# Patient Record
Sex: Female | Born: 1945
Health system: Southern US, Community
[De-identification: ages and names within clinical notes are randomized; demographics above are authoritative.]

## PROBLEM LIST (undated history)

## (undated) DIAGNOSIS — C44622 Squamous cell carcinoma of skin of right upper limb, including shoulder: Secondary | ICD-10-CM

## (undated) DIAGNOSIS — K7689 Other specified diseases of liver: Secondary | ICD-10-CM

## (undated) DIAGNOSIS — K5730 Diverticulosis of large intestine without perforation or abscess without bleeding: Secondary | ICD-10-CM

## (undated) DIAGNOSIS — Q615 Medullary cystic kidney: Secondary | ICD-10-CM

## (undated) DIAGNOSIS — N200 Calculus of kidney: Secondary | ICD-10-CM

## (undated) DIAGNOSIS — K56600 Partial intestinal obstruction, unspecified as to cause: Secondary | ICD-10-CM

## (undated) DIAGNOSIS — E785 Hyperlipidemia, unspecified: Secondary | ICD-10-CM

## (undated) DIAGNOSIS — D649 Anemia, unspecified: Secondary | ICD-10-CM

## (undated) DIAGNOSIS — J449 Chronic obstructive pulmonary disease, unspecified: Secondary | ICD-10-CM

## (undated) DIAGNOSIS — G2581 Restless legs syndrome: Secondary | ICD-10-CM

## (undated) DIAGNOSIS — T7840XA Allergy, unspecified, initial encounter: Secondary | ICD-10-CM

## (undated) DIAGNOSIS — H269 Unspecified cataract: Secondary | ICD-10-CM

## (undated) DIAGNOSIS — J45909 Unspecified asthma, uncomplicated: Secondary | ICD-10-CM

## (undated) DIAGNOSIS — R0902 Hypoxemia: Secondary | ICD-10-CM

## (undated) DIAGNOSIS — B029 Zoster without complications: Secondary | ICD-10-CM

## (undated) DIAGNOSIS — Z860100 Personal history of colon polyps, unspecified: Secondary | ICD-10-CM

## (undated) DIAGNOSIS — I10 Essential (primary) hypertension: Secondary | ICD-10-CM

## (undated) DIAGNOSIS — M1990 Unspecified osteoarthritis, unspecified site: Secondary | ICD-10-CM

## (undated) DIAGNOSIS — K219 Gastro-esophageal reflux disease without esophagitis: Secondary | ICD-10-CM

## (undated) DIAGNOSIS — E042 Nontoxic multinodular goiter: Secondary | ICD-10-CM

## (undated) DIAGNOSIS — E669 Obesity, unspecified: Secondary | ICD-10-CM

## (undated) DIAGNOSIS — Z5189 Encounter for other specified aftercare: Secondary | ICD-10-CM

## (undated) DIAGNOSIS — I872 Venous insufficiency (chronic) (peripheral): Secondary | ICD-10-CM

## (undated) DIAGNOSIS — K449 Diaphragmatic hernia without obstruction or gangrene: Secondary | ICD-10-CM

## (undated) HISTORY — DX: Calculus of kidney: N20.0

## (undated) HISTORY — DX: Encounter for other specified aftercare: Z51.89

## (undated) HISTORY — DX: Squamous cell carcinoma of skin of right upper limb, including shoulder: C44.622

## (undated) HISTORY — DX: Allergy, unspecified, initial encounter: T78.40XA

## (undated) HISTORY — DX: Personal history of colon polyps, unspecified: Z86.0100

## (undated) HISTORY — DX: Gastro-esophageal reflux disease without esophagitis: K21.9

## (undated) HISTORY — DX: Zoster without complications: B02.9

## (undated) HISTORY — DX: Essential (primary) hypertension: I10

## (undated) HISTORY — DX: Venous insufficiency (chronic) (peripheral): I87.2

## (undated) HISTORY — DX: Hypoxemia: R09.02

## (undated) HISTORY — DX: Obesity, unspecified: E66.9

## (undated) HISTORY — DX: Personal history of colonic polyps: Z86.010

## (undated) HISTORY — DX: Restless legs syndrome: G25.81

## (undated) HISTORY — DX: Hyperlipidemia, unspecified: E78.5

## (undated) HISTORY — DX: Other specified diseases of liver: K76.89

## (undated) HISTORY — DX: Anemia, unspecified: D64.9

## (undated) HISTORY — DX: Unspecified cataract: H26.9

## (undated) HISTORY — DX: Unspecified asthma, uncomplicated: J45.909

## (undated) HISTORY — PX: CATARACT EXTRACTION: SUR2

## (undated) HISTORY — PX: FOOT SURGERY: SHX648

## (undated) HISTORY — PX: OTHER SURGICAL HISTORY: SHX169

## (undated) HISTORY — DX: Diverticulosis of large intestine without perforation or abscess without bleeding: K57.30

## (undated) HISTORY — DX: Nontoxic multinodular goiter: E04.2

## (undated) HISTORY — DX: Chronic obstructive pulmonary disease, unspecified: J44.9

## (undated) HISTORY — DX: Diaphragmatic hernia without obstruction or gangrene: K44.9

## (undated) HISTORY — DX: Unspecified osteoarthritis, unspecified site: M19.90

## (undated) HISTORY — PX: COLONOSCOPY: SHX174

## (undated) HISTORY — DX: Medullary cystic kidney: Q61.5

---
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
## 1975-08-06 HISTORY — PX: GLAUCOMA SURGERY: SHX656

## 1994-08-05 HISTORY — PX: OTHER SURGICAL HISTORY: SHX169

## 1998-03-10 ENCOUNTER — Ambulatory Visit (HOSPITAL_COMMUNITY): Admission: RE | Admit: 1998-03-10 | Discharge: 1998-03-10 | Payer: Self-pay | Admitting: Orthopedic Surgery

## 1998-09-05 ENCOUNTER — Other Ambulatory Visit: Admission: RE | Admit: 1998-09-05 | Discharge: 1998-09-05 | Payer: Self-pay | Admitting: Gynecology

## 1999-11-06 ENCOUNTER — Ambulatory Visit (HOSPITAL_BASED_OUTPATIENT_CLINIC_OR_DEPARTMENT_OTHER): Admission: RE | Admit: 1999-11-06 | Discharge: 1999-11-06 | Payer: Self-pay | Admitting: Plastic Surgery

## 1999-11-06 ENCOUNTER — Encounter (INDEPENDENT_AMBULATORY_CARE_PROVIDER_SITE_OTHER): Payer: Self-pay | Admitting: *Deleted

## 2000-01-05 ENCOUNTER — Emergency Department (HOSPITAL_COMMUNITY): Admission: EM | Admit: 2000-01-05 | Discharge: 2000-01-05 | Payer: Self-pay | Admitting: *Deleted

## 2000-01-06 ENCOUNTER — Encounter: Payer: Self-pay | Admitting: *Deleted

## 2000-01-08 ENCOUNTER — Encounter: Payer: Self-pay | Admitting: Orthopedic Surgery

## 2000-01-08 ENCOUNTER — Encounter: Admission: RE | Admit: 2000-01-08 | Discharge: 2000-01-08 | Payer: Self-pay | Admitting: Orthopedic Surgery

## 2000-01-09 ENCOUNTER — Ambulatory Visit (HOSPITAL_BASED_OUTPATIENT_CLINIC_OR_DEPARTMENT_OTHER): Admission: RE | Admit: 2000-01-09 | Discharge: 2000-01-09 | Payer: Self-pay | Admitting: Orthopedic Surgery

## 2000-02-26 ENCOUNTER — Other Ambulatory Visit: Admission: RE | Admit: 2000-02-26 | Discharge: 2000-02-26 | Payer: Self-pay | Admitting: Gynecology

## 2001-03-26 ENCOUNTER — Other Ambulatory Visit: Admission: RE | Admit: 2001-03-26 | Discharge: 2001-03-26 | Payer: Self-pay | Admitting: Gynecology

## 2002-05-10 ENCOUNTER — Other Ambulatory Visit: Admission: RE | Admit: 2002-05-10 | Discharge: 2002-05-10 | Payer: Self-pay | Admitting: Gynecology

## 2003-05-19 ENCOUNTER — Other Ambulatory Visit: Admission: RE | Admit: 2003-05-19 | Discharge: 2003-05-19 | Payer: Self-pay | Admitting: Gynecology

## 2004-06-08 ENCOUNTER — Ambulatory Visit: Payer: Self-pay | Admitting: Gastroenterology

## 2004-06-25 ENCOUNTER — Other Ambulatory Visit: Admission: RE | Admit: 2004-06-25 | Discharge: 2004-06-25 | Payer: Self-pay | Admitting: Gynecology

## 2004-06-25 ENCOUNTER — Ambulatory Visit: Payer: Self-pay | Admitting: Pulmonary Disease

## 2004-10-18 ENCOUNTER — Ambulatory Visit: Payer: Self-pay | Admitting: Pulmonary Disease

## 2004-10-19 ENCOUNTER — Ambulatory Visit: Payer: Self-pay | Admitting: Cardiology

## 2004-10-24 ENCOUNTER — Ambulatory Visit: Payer: Self-pay

## 2004-11-07 ENCOUNTER — Ambulatory Visit: Payer: Self-pay | Admitting: *Deleted

## 2004-11-12 ENCOUNTER — Ambulatory Visit: Payer: Self-pay | Admitting: Pulmonary Disease

## 2004-11-14 ENCOUNTER — Ambulatory Visit: Payer: Self-pay | Admitting: Pulmonary Disease

## 2004-11-16 ENCOUNTER — Ambulatory Visit (HOSPITAL_COMMUNITY): Admission: RE | Admit: 2004-11-16 | Discharge: 2004-11-16 | Payer: Self-pay | Admitting: Cardiology

## 2005-02-15 ENCOUNTER — Ambulatory Visit: Payer: Self-pay | Admitting: Pulmonary Disease

## 2005-09-12 ENCOUNTER — Other Ambulatory Visit: Admission: RE | Admit: 2005-09-12 | Discharge: 2005-09-12 | Payer: Self-pay | Admitting: Gynecology

## 2005-09-20 ENCOUNTER — Ambulatory Visit: Payer: Self-pay | Admitting: Pulmonary Disease

## 2006-03-17 ENCOUNTER — Ambulatory Visit: Payer: Self-pay | Admitting: Pulmonary Disease

## 2006-09-30 ENCOUNTER — Ambulatory Visit: Payer: Self-pay | Admitting: Pulmonary Disease

## 2006-10-16 ENCOUNTER — Ambulatory Visit: Payer: Self-pay | Admitting: Pulmonary Disease

## 2006-10-21 ENCOUNTER — Ambulatory Visit: Payer: Self-pay | Admitting: Pulmonary Disease

## 2007-06-29 ENCOUNTER — Ambulatory Visit (HOSPITAL_COMMUNITY): Admission: RE | Admit: 2007-06-29 | Discharge: 2007-06-29 | Payer: Self-pay | Admitting: Orthopedic Surgery

## 2007-08-06 DIAGNOSIS — B029 Zoster without complications: Secondary | ICD-10-CM

## 2007-08-06 HISTORY — DX: Zoster without complications: B02.9

## 2007-10-21 ENCOUNTER — Encounter: Payer: Self-pay | Admitting: Pulmonary Disease

## 2007-10-21 DIAGNOSIS — H409 Unspecified glaucoma: Secondary | ICD-10-CM | POA: Insufficient documentation

## 2007-10-21 DIAGNOSIS — M1990 Unspecified osteoarthritis, unspecified site: Secondary | ICD-10-CM | POA: Insufficient documentation

## 2007-10-21 DIAGNOSIS — M810 Age-related osteoporosis without current pathological fracture: Secondary | ICD-10-CM | POA: Insufficient documentation

## 2007-10-21 DIAGNOSIS — J4530 Mild persistent asthma, uncomplicated: Secondary | ICD-10-CM | POA: Insufficient documentation

## 2007-10-21 DIAGNOSIS — Z87442 Personal history of urinary calculi: Secondary | ICD-10-CM | POA: Insufficient documentation

## 2007-10-21 DIAGNOSIS — E559 Vitamin D deficiency, unspecified: Secondary | ICD-10-CM | POA: Insufficient documentation

## 2007-10-21 DIAGNOSIS — J209 Acute bronchitis, unspecified: Secondary | ICD-10-CM | POA: Insufficient documentation

## 2007-10-21 DIAGNOSIS — K449 Diaphragmatic hernia without obstruction or gangrene: Secondary | ICD-10-CM | POA: Insufficient documentation

## 2007-10-21 DIAGNOSIS — D126 Benign neoplasm of colon, unspecified: Secondary | ICD-10-CM | POA: Insufficient documentation

## 2007-10-21 DIAGNOSIS — E669 Obesity, unspecified: Secondary | ICD-10-CM

## 2008-05-31 ENCOUNTER — Telehealth: Payer: Self-pay | Admitting: Pulmonary Disease

## 2008-07-12 ENCOUNTER — Encounter: Payer: Self-pay | Admitting: Pulmonary Disease

## 2008-07-27 ENCOUNTER — Encounter: Payer: Self-pay | Admitting: Pulmonary Disease

## 2008-07-27 ENCOUNTER — Ambulatory Visit: Payer: Self-pay | Admitting: Pulmonary Disease

## 2008-08-05 HISTORY — PX: SQUAMOUS CELL CARCINOMA EXCISION: SHX2433

## 2008-08-06 DIAGNOSIS — Q615 Medullary cystic kidney: Secondary | ICD-10-CM | POA: Insufficient documentation

## 2008-08-06 DIAGNOSIS — D179 Benign lipomatous neoplasm, unspecified: Secondary | ICD-10-CM | POA: Insufficient documentation

## 2008-08-06 DIAGNOSIS — E7800 Pure hypercholesterolemia, unspecified: Secondary | ICD-10-CM | POA: Insufficient documentation

## 2008-08-06 DIAGNOSIS — G2581 Restless legs syndrome: Secondary | ICD-10-CM | POA: Insufficient documentation

## 2008-08-06 DIAGNOSIS — E042 Nontoxic multinodular goiter: Secondary | ICD-10-CM | POA: Insufficient documentation

## 2008-08-06 DIAGNOSIS — I10 Essential (primary) hypertension: Secondary | ICD-10-CM | POA: Insufficient documentation

## 2008-08-06 DIAGNOSIS — B029 Zoster without complications: Secondary | ICD-10-CM | POA: Insufficient documentation

## 2008-08-06 DIAGNOSIS — K7689 Other specified diseases of liver: Secondary | ICD-10-CM | POA: Insufficient documentation

## 2008-08-06 DIAGNOSIS — K5730 Diverticulosis of large intestine without perforation or abscess without bleeding: Secondary | ICD-10-CM | POA: Insufficient documentation

## 2009-03-24 ENCOUNTER — Encounter: Payer: Self-pay | Admitting: Pulmonary Disease

## 2009-05-08 ENCOUNTER — Encounter (INDEPENDENT_AMBULATORY_CARE_PROVIDER_SITE_OTHER): Payer: Self-pay | Admitting: *Deleted

## 2009-07-06 ENCOUNTER — Encounter (INDEPENDENT_AMBULATORY_CARE_PROVIDER_SITE_OTHER): Payer: Self-pay | Admitting: *Deleted

## 2009-07-07 ENCOUNTER — Ambulatory Visit: Payer: Self-pay | Admitting: Gastroenterology

## 2009-07-21 ENCOUNTER — Ambulatory Visit: Payer: Self-pay | Admitting: Gastroenterology

## 2009-07-31 ENCOUNTER — Encounter: Payer: Self-pay | Admitting: Gastroenterology

## 2009-08-26 ENCOUNTER — Emergency Department (HOSPITAL_COMMUNITY): Admission: EM | Admit: 2009-08-26 | Discharge: 2009-08-26 | Payer: Self-pay | Admitting: Emergency Medicine

## 2009-08-28 ENCOUNTER — Telehealth (INDEPENDENT_AMBULATORY_CARE_PROVIDER_SITE_OTHER): Payer: Self-pay | Admitting: *Deleted

## 2009-08-31 ENCOUNTER — Ambulatory Visit: Payer: Self-pay | Admitting: Pulmonary Disease

## 2009-09-12 ENCOUNTER — Encounter: Payer: Self-pay | Admitting: Pulmonary Disease

## 2009-09-26 ENCOUNTER — Ambulatory Visit: Payer: Self-pay | Admitting: Pulmonary Disease

## 2009-10-01 DIAGNOSIS — I872 Venous insufficiency (chronic) (peripheral): Secondary | ICD-10-CM | POA: Insufficient documentation

## 2009-10-18 ENCOUNTER — Encounter: Payer: Self-pay | Admitting: Pulmonary Disease

## 2010-08-05 HISTORY — PX: HERNIA REPAIR: SHX51

## 2010-08-26 ENCOUNTER — Encounter: Payer: Self-pay | Admitting: Cardiology

## 2010-09-06 NOTE — Medication Information (Signed)
Summary: Drug Interaction (Tramadol)/[facility]  Drug Interaction (Tramadol)/[facility]   Imported By: Sherian Rein 10/20/2009 10:14:14  _____________________________________________________________________  External Attachment:    Type:   Image     Comment:   External Document

## 2010-09-06 NOTE — Progress Notes (Signed)
Summary: pnuemonia  Phone Note Call from Patient   Caller: Patient Call For: nadel Summary of Call: pt went to er this weekend and was told by Dr Kriste Basque to sch ov this week with him Initial call taken by: Rickard Patience,  August 28, 2009 9:50 AM  Follow-up for Phone Call        Pt was seen in ER on 08/26/09 for pna and was told to f/u with SN. Will forward to [facility], please advise. Thank you!  Gweneth Dimitri RN  August 28, 2009 9:52 AM    ok to add pt on thursday afternoon at 3pm.  thanks Randell Loop [facility]  August 28, 2009 10:53 AM   Additional Follow-up for Phone Call Additional follow up Details #1::        called, spoke with pt.  Pt ok to come in on 1/27 at 3pm to see SN-ov scheduled-pt aware.  Gweneth Dimitri RN  August 28, 2009 11:13 AM

## 2010-09-06 NOTE — Assessment & Plan Note (Signed)
Summary: cpx/fasting/apc   CC:  Follow up visit & yearly CPX....  History of Present Illness: 65 y/o WF here for a follow up visit... she has multiple medical problems as noted below...    ~  Dec09:  she works for [employer] gets all her labs done there... she has had a good year without asthma exas etc... she noted a sl rash on left side of her mid-back x several days- this looks like left T9 shingles & we will Rx accordingly w/ Acyclovir & Medrol Dosepak...   ~  August 31, 2009:  she went to the ER 08/26/09 w/ asthma exac ppt by URI w/ cough, congestion, wheezing, tightness, incr SOB.Marland Kitchen. temp to 101, cough w/ green sputum, CWP from coughing... in the ER>> CXR showed incr markings w/o infiltrate, CBC= OK w/ WBC 5,600 (92% seg), Chem showed K3.3 & BS186... given Avelox, Pred taper, Cough syrup- sl better now... still congested & needs MUCINEX Bid/ Fluids/ etc... she had cut down Advair to 250, and not using the Nebulizer...   ~  September 26, 2009:  improved on the above Rx, back on nebs & Advair500... BP controlled on meds... Chol is OK on Simva40... still needs to get on track w/ diet & exercise program... she donates blood Q8wks at [facility]- CBC looks good... she had Flu shot 10/10, and requests 90d Rxs...    Current Problems:   PHYSICAL EXAMINATION (ICD-V70.0) - see attached pt summary note of her meds, physicians, etc... GYN= DrBowie & she is up-to-date... had Flu shot 10/09 & 10/10... had Pneumovax 9/02... had Tetanus shot  6/01>> Tdap given today 09/26/09...  GLAUCOMA (ICD-365.9) - s/p glaucoma sury by [facility] in 1977 & followed by DrShapiro w/ surg yrs ago (no drops required & she sees Ophthal yearly).  COPD (ICD-496) & ASTHMATIC BRONCHITIS, ACUTE (ICD-466.0) - on THEO 300mg Bid,  ALBUTEROL 4mg Bid,  ADVAIR 500/50 Bid,  SINGULAIR 10mg /d,  NEBS w/ Albut Prn... her breathing has been doing well (until the 1/11 exacerbation- see above)...  ~  baseline CXR= sl incr markings, NAD...  ~  prev CT  Chest 4/06 showed mild basilar atx, +multinod goiter, liver cysts, left renal calc, spurs, etc.  ~  PFT's 3/08 showed FVC= 1.95 (71%), FEV1= 1.13 (56%), %1sec= 58, mid-flows= 20%pred, +mild air trapping & DLCO/VA= normal...  ~  CXR 1/11 showed incr markings, sl elev right hemidiaph, NAD...  HYPERTENSION (ICD-401.9) - on ASA 81mg /d,  DYAZIDE daily + KCl daily... BP= 140/80 today & better than this at home checks... denies HA, fatigue, visual changes, CP, palipit, dizziness, syncope, dyspnea, edema, etc...   ~  cardiac eval 2006 by DrWall- neg w/ 2DEcho showing mild MR, sm effusion, norm LV w/ EF=55%.  VENOUS INSUFFICIENCY (ICD-459.81) - she knows to avoid sodium, elevate legs, wear support hose.  HYPERCHOLESTEROLEMIA (ICD - on SIMVASTATIN 40mg /d,  Fish Oil 2/d,  CoQ10 daily...  ~  FLP 2/07 off meds showed TChol 246, TG 151, HDL 56, LDL 160... rec- restart Lip10.  ~  FLP 7/08 on Lip10 showed TChol 156, TG 96, HDL 56, LDL 81... continue same.  ~  FLP 12/09 on Lip10 showed TChol 169, TG 95, HDL 59, LDL 91... continue same.  ~  Insurance Co 11/10 rec change to SIMVASTATIN 40mg /d...  ~  FLP 2/11 on Simva40 showed TChol 191, TG 138, HDL 65, LDL 98... continue same.  NONTOXIC MULTINODULAR GOITER (ICD-241.1) - CT Chest 4/06 showed several thyroid nodules- largest= 1cm... clinically and biochem euthyroid...  ~  labs 3/08 showed TSH= 0.50  ~  labs 12/09 showed TSH= 1.08  ~  labs 2/11 showed TSH= 1.11  OBESITY (ICD-278.00) - range 200 - 220 over 10+ yrs.  ~  weight 12/09 = 219#, up 5# in the last year...  ~  weight 1/11 = 211#  HIATAL HERNIA (ICD-553.3) - on RANITADINE 150mg  Prn... he denies N/ V/ abd pain, etc...  DIVERTICULOSIS OF COLON (ICD-562.10) & COLONIC POLYPS (ICD-211.3) -   ~  colonoscopy 11/05 by DrPatterson showed divertics & 4mm polyp= hyperplastic... f/u planned 17yrs.  ~  colonoscopy 12/10 showed 1 cecal polyp= tubular adenoma, f/u planned 73yrs.  Hx of HEPATIC CYST (ICD-573.8)  - AbdSonar 4/06 showed mult large liver cysts, all simple cysts, & largest on dome of right lobe measures 11 cm...  Hx of MEDULLARY SPONGE KIDNEY (ICD-753.17) & RENAL CALCULUS, HX OF (ICD-V13.01) - followed by DrWrenn in past... she is asymptomatic & renal function normal...  ~  labs 2/11 showed BUN= 18, Creat= 0.66  DEGENERATIVE JOINT DISEASE (ICD-715.90) - on DCN100 Prn for pain... she sees DrSypher & DrAplington in the past- s/p bilat wrist fx: right 1996, left 2001... s/p bilat CTS surg: left 1999, right 2008...  OSTEOPOROSIS (ICD-733.00) - on ALENDRONATE 70mg /wk per DrBowie... plus Calcium, MVI and Vit D 1000u daily...  ~  BMD 3/08 at [facility] showed TScores -2.8 in Spine, -1.5 in left [facility]...  VITAMIN D DEFICIENCY (ICD-268.9) - as above.  Hx of RESTLESS LEG SYNDROME (ICD-333.94)  LIPOMA (ICD-214.9)  SHINGLES (ICD-053.9) - 12/09 involving left T9 distrib.  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   Allergies: 1)  ! * Plastic Tape 2)  ! Codeine 3)  ! Augmentin  Comments:  Nurse/Medical Assistant: The patient's medications and allergies were reviewed with the patient and were updated in the Medication and Allergy Lists.  Past History:  Past Medical History:  GLAUCOMA (ICD-365.9) COPD (ICD-496) ASTHMATIC BRONCHITIS, ACUTE (ICD-466.0) HYPERTENSION (ICD-401.9) VENOUS INSUFFICIENCY (ICD-459.81) HYPERCHOLESTEROLEMIA (ICD-272.0) NONTOXIC MULTINODULAR GOITER (ICD-241.1) OBESITY (ICD-278.00) HIATAL HERNIA (ICD-553.3) DIVERTICULOSIS OF COLON (ICD-562.10) COLONIC POLYPS (ICD-211.3) Hx of HEPATIC CYST (ICD-573.8) Hx of MEDULLARY SPONGE KIDNEY (ICD-753.17) RENAL CALCULUS, HX OF (ICD-V13.01) DEGENERATIVE JOINT DISEASE (ICD-715.90) OSTEOPOROSIS (ICD-733.00) VITAMIN D DEFICIENCY (ICD-268.9) Hx of RESTLESS LEG SYNDROME (ICD-333.94) LIPOMA (ICD-214.9) SHINGLES (ICD-053.9)  Past Surgical History: S/P hernia repair 1963 S/P tonsillectomy 1967 S/P  glaucoma surgery 1977 S/P bilat wrist fx- right 1996, left 2001 - DrSypher S/P bilat carpel tunnel repairs- left 1999, right 2008- DrAplington  Family History: Reviewed history from 07/27/2008 and no changes required. Father died age 65 w/ lung cancer- smoker, silicosis, old Tb. Mother died age 64 w/ pneumonia, emphysema, smoker. 4Siblings-  1Sis- Mary- w/ small cell lung cancer 1Sis w/ DM & HBP 1Sis w/ HBP 1Bro w/ DM. DJD/ LBP  Social History: Reviewed history from 07/27/2008 and no changes required. Married- husb= Peyton Najjar 2 children in their 30's never smoked social etoh Producer, television/film/video for [employer]  Review of Systems      See HPI       The patient complains of nasal congestion, dyspnea on exertion, change in bowel habits, indigestion/heartburn, joint pain, stiffness, and anxiety.  The patient denies fever, chills, sweats, anorexia, fatigue, weakness, malaise, weight loss, sleep disorder, blurring, diplopia, eye irritation, eye discharge, vision loss, eye pain, photophobia, earache, ear discharge, tinnitus, decreased hearing, nosebleeds, sore throat, hoarseness, chest pain, palpitations, syncope, orthopnea, PND, peripheral edema, cough, dyspnea at rest, excessive sputum,  hemoptysis, wheezing, pleurisy, nausea, vomiting, diarrhea, constipation, abdominal pain, melena, hematochezia, jaundice, gas/bloating, dysphagia, odynophagia, dysuria, hematuria, urinary frequency, urinary hesitancy, nocturia, incontinence, back pain, joint swelling, muscle cramps, muscle weakness, arthritis, sciatica, restless legs, leg pain at night, leg pain with exertion, rash, itching, dryness, suspicious lesions, paralysis, paresthesias, seizures, tremors, vertigo, transient blindness, frequent falls, frequent headaches, difficulty walking, depression, memory loss, confusion, cold intolerance, heat intolerance, polydipsia, polyphagia, polyuria, unusual weight change, abnormal bruising, bleeding, enlarged  lymph nodes, urticaria, allergic rash, hay fever, and recurrent infections.    Vital Signs:  Patient profile:   65 year old female Height:      61 inches Weight:      219.25 pounds BMI:     41.58 O2 Sat:      96 % on Room air Temp:     99.5 degrees F oral Pulse rate:   83 / minute BP sitting:   140 / 80  (left arm) Cuff size:   large  Vitals Entered By: Randell Loop CMA (September 26, 2009 8:43 AM)  O2 Sat at Rest %:  96 O2 Flow:  Room air CC: Follow up visit & yearly CPX... Is Patient Diabetic? No Pain Assessment Patient in pain? no      Comments meds updated today   Physical Exam  Additional Exam:  WD, overweight, 65 y/o WF in NAD... GENERAL:  Alert & oriented; pleasant & cooperative... HEENT:  [place]/AT, EOM-wnl, PERRLA, EACs-clear, TMs-wnl, NOSE-clear, THROAT-clear & wnl. NECK:  Supple w/ fairROM; no JVD; normal carotid impulses w/o bruits; no thyromegaly or nodules palpated; no lymphadenopathy. CHEST:  Clear to P & A; without wheezes/ rales/ or rhonchi heard... HEART:  Regular Rhythm; without murmurs/ rubs/ or gallops detedted... ABDOMEN:  Soft & nontender; normal bowel sounds; no organomegaly or masses palpated... EXT: without deformities, mild arthritic changes; no varicose veins/ +venous insuffic/ no edema. NEURO:  CN's intact; motor testing normal; sensory testing normal; gait normal & balance OK. DERM:  mild shingles rash on back- left T9 distrib...    MISC. Report  Procedure date:  09/13/2009  Findings:      LABS DONE AT HER WORK= [facility] & scanned into EMR...  SN   Impression & Recommendations:  Problem # 1:  COPD (ICD-496) She has chr persist asthma... continue meds regularly... needs to get wt down... Her updated medication list for this problem includes:    Albuterol Sulfate (2.5 Mg/13ml) 0.083% Nebu (Albuterol sulfate) ..... Use one vial in neb machine up to four times per day as needed    Advair Diskus 500-50 Mcg/dose Aepb (Fluticasone-salmeterol)  .Marland Kitchen... 1 inhalation two times a day...    Proair Hfa 108 (90 Base) Mcg/act Aers (Albuterol sulfate) .Marland Kitchen... 1-2 sp every 4-6 h as needed...    Theophylline Cr 300 Mg Tb12 (Theophylline) .Marland Kitchen... Take one tablet by mouth two times a day    Albuterol Sulfate 4 Mg Tabs (Albuterol sulfate) .Marland Kitchen... Take one tablet by mouth two times a day    Singulair 10 Mg Tabs (Montelukast sodium) .Marland Kitchen... Take one tablet by mouth every evening  Problem # 2:  HYPERTENSION (ICD-401.9) Controlled-  same meds. Her updated medication list for this problem includes:    Triamterene-hctz 37.5-25 Mg Tabs (Triamterene-hctz) .Marland Kitchen... Take 1 tablet by mouth once a day  Problem # 3:  HYPERCHOLESTEROLEMIA (ICD-272.0) Stable-  continue same + better diet!!! Her updated medication list for this problem includes:    Simvastatin 40 Mg Tabs (Simvastatin) .Marland Kitchen... Take one tablet by  mouth at bedtime  Problem # 4:  NONTOXIC MULTINODULAR GOITER (ICD-241.1) Aware-  TFT's remain WNL.Marland KitchenMarland Kitchen  Problem # 5:  OBESITY (ICD-278.00) Diet + exercise are the key...  Problem # 6:  DIVERTICULOSIS OF COLON (ICD-562.10) GI is stable...  Problem # 7:  Hx of MEDULLARY SPONGE KIDNEY (ICD-753.17) Renal is stable... no recent stones, etc...  Problem # 8:  OSTEOPOROSIS (ICD-733.00) Continue Rx... BMDs per GYN... Her updated medication list for this problem includes:    Fosamax 70 Mg Tabs (Alendronate sodium) .Marland Kitchen... Take one tablet by mouth every week  Problem # 9:  OTHER MEDICAL PROBLEMS AS NOTED>>> OK TDAP today...  Complete Medication List: 1)  Adult Aspirin Low Strength 81 Mg Tbdp (Aspirin) .... Take 2  tablet by mouth at bedtime 2)  Albuterol Sulfate (2.5 Mg/25ml) 0.083% Nebu (Albuterol sulfate) .... Use one vial in neb machine up to four times per day as needed 3)  Advair Diskus 500-50 Mcg/dose Aepb (Fluticasone-salmeterol) .Marland Kitchen.. 1 inhalation two times a day... 4)  Proair Hfa 108 (90 Base) Mcg/act Aers (Albuterol sulfate) .Marland Kitchen.. 1-2 sp every 4-6 h as  needed... 5)  Theophylline Cr 300 Mg Tb12 (Theophylline) .... Take one tablet by mouth two times a day 6)  Albuterol Sulfate 4 Mg Tabs (Albuterol sulfate) .... Take one tablet by mouth two times a day 7)  Singulair 10 Mg Tabs (Montelukast sodium) .... Take one tablet by mouth every evening 8)  Triamterene-hctz 37.5-25 Mg Tabs (Triamterene-hctz) .... Take 1 tablet by mouth once a day 9)  Klor-con M20 20 Meq Tbcr (Potassium chloride crys cr) .... Take 1 tablet by mouth once a day 10)  Simvastatin 40 Mg Tabs (Simvastatin) .... Take one tablet by mouth at bedtime 11)  Gnp Fish Oil 1200 Mg Caps (Omega-3 fatty acids) .... Take 2 capsules by mouth once daily 12)  Coq10 100 Mg Caps (Coenzyme q10) .... Take 1 tablet by mouth once a day 13)  Ranitidine Hcl 150 Mg Tabs (Ranitidine hcl) .... Take 1 tablet by mouth once a day 14)  Fosamax 70 Mg Tabs (Alendronate sodium) .... Take one tablet by mouth every week 15)  Multivitamins Tabs (Multiple vitamin) .... Take 1 tablet by mouth once a day 16)  Calcium 600/vitamin D 600-400 Mg-unit Chew (Calcium carbonate-vitamin d) .... Take 2  tablet by mouth once a day 17)  Vitamin D 1000 Unit Tabs (Cholecalciferol) .... Take 1 tablet by mouth once a day 18)  Tramadol Hcl 50 Mg Tabs (Tramadol hcl) .... Take one tablet by mouth three times a day as needed for pain  Other Orders: EKG w/ Interpretation (93000) Prescription Created Electronically [phone]) Tdap => 25yrs IM (96295) Admin 1st Vaccine (28413)  Patient Instructions: 1)  Today we updated your med list- see below.... 2)  We refilled your meds for 2011 as requested... 3)  Today we gave you the TDaP- tetanus booster shot (good for 61yrs)... 4)  [place]ah, good luck w/ the [employer]--- the goal is to lose 15-20 lbs... 5)  Also increase your exercise program... 6)  Call for any problems.Marland KitchenMarland Kitchen 7)  Please schedule a follow-up appointment in 1 year, sooner as needed. Prescriptions: TRAMADOL HCL 50 MG TABS  (TRAMADOL HCL) take one tablet by mouth three times a day as needed for pain  #90 x 3   Entered by:   Randell Loop CMA   Authorized by:   Michele Mcalpine MD   Signed by:   Randell Loop CMA on 09/26/2009  Method used:   Print then Give to Patient   RxID:   [id number] RANITIDINE HCL 150 MG  TABS (RANITIDINE HCL) Take 1 tablet by mouth once a day  #90 x prn   Entered and Authorized by:   Michele Mcalpine MD   Signed by:   Michele Mcalpine MD on 09/26/2009   Method used:   Print then Give to Patient   RxID:   [id number] SIMVASTATIN 40 MG TABS (SIMVASTATIN) take one tablet by mouth at bedtime  #90 x prn   Entered and Authorized by:   Michele Mcalpine MD   Signed by:   Michele Mcalpine MD on 09/26/2009   Method used:   Print then Give to Patient   RxID:   [id number] KLOR-CON M20 20 MEQ  TBCR (POTASSIUM CHLORIDE CRYS CR) Take 1 tablet by mouth once a day  #90 x prn   Entered and Authorized by:   Michele Mcalpine MD   Signed by:   Michele Mcalpine MD on 09/26/2009   Method used:   Print then Give to Patient   RxID:   [id number] TRIAMTERENE-HCTZ 37.5-25 MG  TABS (TRIAMTERENE-HCTZ) Take 1 tablet by mouth once a day  #90 x prn   Entered and Authorized by:   Michele Mcalpine MD   Signed by:   Michele Mcalpine MD on 09/26/2009   Method used:   Print then Give to Patient   RxID:   [id number] SINGULAIR 10 MG  TABS (MONTELUKAST SODIUM) take one tablet by mouth every evening  #90 x 0   Entered and Authorized by:   Michele Mcalpine MD   Signed by:   Michele Mcalpine MD on 09/26/2009   Method used:   Print then Give to Patient   RxID:   [id number] ALBUTEROL SULFATE 4 MG TABS (ALBUTEROL SULFATE) take one tablet by mouth two times a day  #180 x prn   Entered and Authorized by:   Michele Mcalpine MD   Signed by:   Michele Mcalpine MD on 09/26/2009   Method used:   Print then Give to Patient   RxID:   [id number] THEOPHYLLINE CR 300 MG  TB12 (THEOPHYLLINE) take one tablet by mouth two times a day   #180 x prn   Entered and Authorized by:   Michele Mcalpine MD   Signed by:   Michele Mcalpine MD on 09/26/2009   Method used:   Print then Give to Patient   RxID:   [id number] PROAIR HFA 108 (90 BASE) MCG/ACT AERS (ALBUTEROL SULFATE) 1-2 sp every 4-6 H as needed...  #3 x prn   Entered and Authorized by:   Michele Mcalpine MD   Signed by:   Michele Mcalpine MD on 09/26/2009   Method used:   Print then Give to Patient   RxID:   [id number]    [facility] ECG  ID: [id number] Patient: Lisa Hamilton DOB: [birth year] Age: 65 Years Old Sex: Female Race: White Physician: Shaundra Fullam Technician: Randell Loop CMA Height: 61 Weight: 219.25 Status: Unconfirmed Past Medical History:   GLAUCOMA (ICD-365.9) COPD (ICD-496) ASTHMATIC BRONCHITIS, ACUTE (ICD-466.0) HYPERTENSION (ICD-401.9) HYPERCHOLESTEROLEMIA (ICD-272.0) NONTOXIC MULTINODULAR GOITER (ICD-241.1) OBESITY (ICD-278.00) HIATAL HERNIA (ICD-553.3) DIVERTICULOSIS OF COLON (ICD-562.10) COLONIC POLYPS (ICD-211.3) Hx of HEPATIC CYST (ICD-573.8) Hx of MEDULLARY SPONGE KIDNEY (ICD-753.17) RENAL CALCULUS, HX OF (ICD-V13.01) DEGENERATIVE JOINT DISEASE (ICD-715.90) OSTEOPOROSIS (ICD-733.00) VITAMIN D DEFICIENCY (ICD-268.9) Hx of RESTLESS LEG SYNDROME (ICD-333.94) LIPOMA (ICD-214.9)  SHINGLES (ICD-053.9)   Recorded: 09/26/2009 09:03 AM P/PR: 120 ms / 142 ms - Heart rate (maximum exercise) QRS: 90 QT/QTc/QTd: 351 ms / 390 ms / 63 ms - Heart rate (maximum exercise)  P/QRS/T axis: 72 deg / 0 deg / 72 deg - Heart rate (maximum exercise)  Heartrate: 82 bpm  Interpretation:   sinus rhythm   Normal ECG    Immunizations Administered:  Tetanus Vaccine:    Vaccine Type: Tdap    Site: left deltoid    Mfr: boostrix    Dose: 0.5 ml    Route: IM    Given by: Randell Loop CMA    Exp. Date: 02/03/2011    Lot #: [id number]    VIS given: 06/23/07 version given September 26, 2009.

## 2010-09-06 NOTE — Assessment & Plan Note (Signed)
Summary: er follow up--pna/la   CC:  13 month ROV & add-on for ER follow-up....  History of Present Illness: 65 y/o WF here for a follow up visit... she has multiple medical problems as noted below...    ~  Dec09:  she works for [employer] gets all her labs done there... she has had a good year without asthma exas etc... she noted a sl rash on left side of her mid-back x several days- this looks like left T9 shingles & we will Rx accordingly w/ Acyclovir & Medrol Dosepak...   ~  August 31, 2009:  she went to the ER 08/26/09 w/ asthma exac ppt by URI w/ cough, congestion, wheezing, tightness, incr SOB.Marland Kitchen. temp to 101, cough w/ green sputum, CWP from coughing... in the ER>> CXR showed incr markings w/o infiltrate, CBC= OK w/ WBC 5,600 (92% seg), Chem showed K3.3 & BS186... given Avelox, Pred taper, Cough syrup- sl better now... still congested & needs MUCINEX Bid/ Fluids/ etc... she had cut down Advair to 250, and not using the Nebulizer...     Current Problems:   PHYSICAL EXAMINATION (ICD-V70.0) - see attached pt summary note of her meds, physicians, etc... GYN= DrBowie & she is up-to-date... had Flu shot 10/09 & 10/10... had Pneumovax 9/02... had Tetanus shot  6/01...   GLAUCOMA (ICD-365.9) - s/p glaucoma sury by DrHRubin in 1977 & folowed by Drshapiro...  COPD (ICD-496) & ASTHMATIC BRONCHITIS, ACUTE (ICD-466.0) - on THEO 300mg Bid,  ALBUTEROL 4mg Bid,  ADVAIR 500/50 Bid,  SINGULAIR 10mg /d,  NEBS w/ Albut Prn... her breathing has been doing well (until the 1/11 exacerbation- see above)...  ~  baseline CXR= sl incr markings, NAD...  ~  prev CT Chest 4/06 showed mild basilar atx, +multinod goiter, liver cysts, left renal calc, spurs, etc.  ~  PFT's 3/08 showed FVC= 1.95 (71%), FEV1= 1.13 (56%), %1sec= 58, mid-flows= 20%pred, +mild air trapping & DLCO/VA= normal...  ~  CXR 1/11 showed incr markings, sl elev right hemidiaph, NAD...  HYPERTENSION (ICD-401.9) - on ASA 81mg /d,  DYAZIDE daily + KCl  daily... BP= 140/88 today & better than this at home checks... denies HA, fatigue, visual changes, CP, palipit, dizziness, syncope, dyspnea, edema, etc...   ~  cardiac eval 2006 by DrWall- neg w/ 2DEcho showing mild MR, sm effusion, norm LV w/ EF=55%.  HYPERCHOLESTEROLEMIA (ICD - on LIPITOR 10mg /d,  Fish Oil 2/d,  CoQ10 daily...  ~  FLP 2/07 off meds showed TChol 246, TG 151, HDL 56, LDL 160... rec- restart Lip10.  ~  FLP 7/08 on Lip10 showed TChol 156, TG 96, HDL 56, LDL 81... rec- continue same.  ~  FLP 12/09 showed TChol 169, TG 95, HDL 59, LDL 91... rec- continue same.  NONTOXIC MULTINODULAR GOITER (ICD-241.1) - CT Chest 4/06 showed several thyroid nodules- largest= 1cm... clinically and biochem euthyroid...  ~  labs 3/08 showed TSH= 0.50...  ~  labs 12/09 showed TSH= 1.08...  OBESITY (ICD-278.00) - range 200 - 220 over 10+ yrs.  ~  weight 12/09 = 219#, up 5# in the last year...  ~  weight 1/11 = 211#  HIATAL HERNIA (ICD-553.3) - on RANITADINE 150mg  Prn...  DIVERTICULOSIS OF COLON (ICD-562.10) & COLONIC POLYPS (ICD-211.3) -   ~  colonoscopy 11/05 by DrPatterson showed divertics & 4mm polyp= hyperplastic... f/u planned 65yrs.  ~  colonoscopy 12/09 showed 1 cecal polyp= tubular adenoma, f/u planned 25yrs.  Hx of HEPATIC CYST (ICD-573.8) - AbdSonar 4/06 showed mult large liver  cysts, all simple cysts, & largest on dome of right lobe measures 11 cm...  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  DEGENERATIVE JOINT DISEASE (ICD-715.90) - on DCN100 Prn for pain... she sees DrSypher & DrAplington in the past- s/p bilat wrist fx: right 1996, left 2001... s/p bilat CTS surg: left 1999, right 2008...  OSTEOPOROSIS (ICD-733.00) - on ALENDRONATE 70mg /wk per DrBowie... plus Calcium, MVI and Vit D 1000u daily...  ~  BMD 3/08 at [facility] showed TScores -2.8 in Spine, -1.5 in left [facility]...  VITAMIN D DEFICIENCY (ICD-268.9) - as  above.  Hx of RESTLESS LEG SYNDROME (ICD-333.94)  LIPOMA (ICD-214.9)  SHINGLES (ICD-053.9) - 12/09 involving left T9 distrib.    Allergies (verified): 1)  ! * Plastic Tape 2)  ! Codeine 3)  ! Augmentin  Past History:  Past Medical History:  GLAUCOMA (ICD-365.9) COPD (ICD-496) ASTHMATIC BRONCHITIS, ACUTE (ICD-466.0) HYPERTENSION (ICD-401.9) HYPERCHOLESTEROLEMIA (ICD-272.0) NONTOXIC MULTINODULAR GOITER (ICD-241.1) OBESITY (ICD-278.00) HIATAL HERNIA (ICD-553.3) DIVERTICULOSIS OF COLON (ICD-562.10) COLONIC POLYPS (ICD-211.3) Hx of HEPATIC CYST (ICD-573.8) Hx of MEDULLARY SPONGE KIDNEY (ICD-753.17) RENAL CALCULUS, HX OF (ICD-V13.01) DEGENERATIVE JOINT DISEASE (ICD-715.90) OSTEOPOROSIS (ICD-733.00) VITAMIN D DEFICIENCY (ICD-268.9) Hx of RESTLESS LEG SYNDROME (ICD-333.94) LIPOMA (ICD-214.9) SHINGLES (ICD-053.9)  Past Surgical History: S/P hernia repair 1963 S/P tonsillectomy 1967 S/P glaucoma surgery 1977 S/P bilat wrist fx- right 1996, left 2001 - DrSypher S/P bilat carpel tunnel repairs- left 1999, right 2008- DrAplington  Family History: Reviewed history from 07/27/2008 and no changes required. Father died age 63 w/ lung cancer- smoker, silicosis, old Tb. Mother died age 39 w/ pneumonia, emphysema, smoker. 4Siblings-  1Sis- Mary- w/ small cell lung cancer 1Sis w/ DM & HBP 1Sis w/ HBP 1Bro w/ DM. DJD/ LBP  Social History: Reviewed history from 07/27/2008 and no changes required. Married- husb= Peyton Najjar 2 children in their 30's never smoked social etoh Producer, television/film/video for [employer]  Review of Systems      See HPI       The patient complains of dyspnea on exertion and prolonged cough.  The patient denies anorexia, fever, weight loss, weight gain, vision loss, decreased hearing, hoarseness, chest pain, syncope, peripheral edema, headaches, hemoptysis, abdominal pain, melena, hematochezia, severe indigestion/heartburn, hematuria, incontinence, muscle  weakness, suspicious skin lesions, transient blindness, difficulty walking, depression, unusual weight change, abnormal bleeding, enlarged lymph nodes, and angioedema.    Vital Signs:  Patient profile:   65 year old female Height:      61 inches Weight:      211.25 pounds BMI:     40.06 O2 Sat:      92 % on Room air Temp:     98.1 degrees F oral Pulse rate:   92 / minute BP sitting:   140 / 88  (right arm) Cuff size:   regular  Vitals Entered By: Boone Master CNA (August 31, 2009 9:42 AM)  O2 Flow:  Room air  Physical Exam  Additional Exam:  WD, overweight, 65 y/o WF in NAD... GENERAL:  Alert & oriented; pleasant & cooperative... HEENT:  [place]/AT, EOM-wnl, PERRLA, EACs-clear, TMs-wnl, NOSE-clear, THROAT-clear & wnl. NECK:  Supple w/ fairROM; no JVD; normal carotid impulses w/o bruits; no thyromegaly or nodules palpated; no lymphadenopathy. CHEST:  Clear to P & A; without wheezes/ rales/ or rhonchi heard... HEART:  Regular Rhythm; without murmurs/ rubs/ or gallops detedted... ABDOMEN:  Soft & nontender; normal bowel sounds; no organomegaly or masses palpated... EXT: without deformities, mild  arthritic changes; no varicose veins/ +venous insuffic/ no edema. NEURO:  CN's intact; motor testing normal; sensory testing normal; gait normal & balance OK. DERM:  mild shingles rash on back- left T9 distrib...     MISC. Report  Procedure date:  08/31/2009  Findings:      Reviewed data from ER 08/26/09 including EDP note, CXR, Labs, etc...  SN   Impression & Recommendations:  Problem # 1:  ASTHMATIC BRONCHITIS, ACUTE (ICD-466.0) Asthma eaxc & CXR w/ incr markings but similar to old films... Rx w/ Avelox, Pred, Mucinex, Fluids, & use NEBS Qid... Her updated medication list for this problem includes:    Albuterol Sulfate (2.5 Mg/39ml) 0.083% Nebu (Albuterol sulfate) ..... Use one vial in neb machine up to four times per day as needed    Advair Diskus 500-50 Mcg/dose Aepb  (Fluticasone-salmeterol) .Marland Kitchen... 1 inhalation two times a day...    Proair Hfa 108 (90 Base) Mcg/act Aers (Albuterol sulfate) .Marland Kitchen... 1-2 sp every 4-6 h as needed...    Theophylline Cr 300 Mg Tb12 (Theophylline) .Marland Kitchen... Take one tablet by mouth two times a day    Albuterol Sulfate 4 Mg Tabs (Albuterol sulfate) .Marland Kitchen... Take one tablet by mouth two times a day    Singulair 10 Mg Tabs (Montelukast sodium) .Marland Kitchen... Take one tablet by mouth every evening  Problem # 2:  HYPERTENSION (ICD-401.9) Controlled-  same med. Her updated medication list for this problem includes:    Triamterene-hctz 37.5-25 Mg Tabs (Triamterene-hctz) .Marland Kitchen... Take 1 tablet by mouth once a day  Problem # 3:  HYPERCHOLESTEROLEMIA (ICD-272.0) She will ret for CPX next month w/ labs... Her updated medication list for this problem includes:    Simvastatin 40 Mg Tabs (Simvastatin) .Marland Kitchen... Take one tablet by mouth at bedtime  Problem # 4:  NONTOXIC MULTINODULAR GOITER (ICD-241.1) Stable- f/u labs in Feb...  Problem # 5:  OBESITY (ICD-278.00) Weight reduction is critical but she has been unsuccessful...  Problem # 6:  HIATAL HERNIA (ICD-553.3) GI is stable-  same meds... Her updated medication list for this problem includes:    Ranitidine Hcl 150 Mg Tabs (Ranitidine hcl) .Marland Kitchen... Take 1 tablet by mouth once a day  Problem # 7:  OTHER MEDICAL PROBLEMS AS NOTED>>>  Complete Medication List: 1)  Adult Aspirin Low Strength 81 Mg Tbdp (Aspirin) .... Take 1 tablet by mouth once a day 2)  Albuterol Sulfate (2.5 Mg/53ml) 0.083% Nebu (Albuterol sulfate) .... Use one vial in neb machine up to four times per day as needed 3)  Advair Diskus 500-50 Mcg/dose Aepb (Fluticasone-salmeterol) .Marland Kitchen.. 1 inhalation two times a day... 4)  Proair Hfa 108 (90 Base) Mcg/act Aers (Albuterol sulfate) .Marland Kitchen.. 1-2 sp every 4-6 h as needed... 5)  Theophylline Cr 300 Mg Tb12 (Theophylline) .... Take one tablet by mouth two times a day 6)  Albuterol Sulfate 4 Mg Tabs (Albuterol  sulfate) .... Take one tablet by mouth two times a day 7)  Singulair 10 Mg Tabs (Montelukast sodium) .... Take one tablet by mouth every evening 8)  Triamterene-hctz 37.5-25 Mg Tabs (Triamterene-hctz) .... Take 1 tablet by mouth once a day 9)  Klor-con M20 20 Meq Tbcr (Potassium chloride crys cr) .... Take 1 tablet by mouth once a day 10)  Simvastatin 40 Mg Tabs (Simvastatin) .... Take one tablet by mouth at bedtime 11)  Gnp Fish Oil 1200 Mg Caps (Omega-3 fatty acids) .... Take 2 capsules by mouth once daily 12)  Coq10 100 Mg Caps (Coenzyme q10) .Marland KitchenMarland KitchenMarland Kitchen  Take 1 tablet by mouth once a day 13)  Flax Seed Oil 1000 Mg Caps (Flaxseed (linseed)) .... Take 1 tablet by mouth once a day 14)  Ranitidine Hcl 150 Mg Tabs (Ranitidine hcl) .... Take 1 tablet by mouth once a day 15)  Propoxyphene N-apap 100-650 Mg Tabs (Propoxyphene n-apap) .... Take one tablet by mouth every 4-6 hours as needed for pain 16)  Fosamax 70 Mg Tabs (Alendronate sodium) .... Take one tablet by mouth every week 17)  Multivitamins Tabs (Multiple vitamin) .... Take 1 tablet by mouth once a day 18)  Calcium 600/vitamin D 600-400 Mg-unit Chew (Calcium carbonate-vitamin d) .... Take 1 tablet by mouth once a day 19)  Vitamin D 1000 Unit Tabs (Cholecalciferol) .... Take 1 tablet by mouth once a day  Other Orders: Prescription Created Electronically [phone])  Patient Instructions: 1)  Today we updated your med list- see below.... 2)  Finish the [facility] & the PREDNISONE as perscribed... 3)  Be sure to continue your ADVAIR, ALBUTEROL, SINGULAIR, etc... 4)  Use the NEBULIZER 3-4 times daily.Marland KitchenMarland Kitchen 5)  Add the [facility] MAX 1,200mg  two times a day w/ plenty of water... 6)  Watch your intake- no sweets, sugars, etc... 7)  Call for any problems... 8)  Let's plan to keep the appt in Feb for your physical w/ fasting blood work... Prescriptions: ADVAIR DISKUS 500-50 MCG/DOSE AEPB (FLUTICASONE-SALMETEROL) 1 inhalation two times a day...  #1 x prn    Entered and Authorized by:   Michele Mcalpine MD   Signed by:   Michele Mcalpine MD on 08/31/2009   Method used:   Print then Give to Patient   RxID:   [id number] ALBUTEROL SULFATE (2.5 MG/3ML) 0.083% NEBU (ALBUTEROL SULFATE) use one vial in neb machine up to four times per day as needed  #100 x prn   Entered and Authorized by:   Michele Mcalpine MD   Signed by:   Michele Mcalpine MD on 08/31/2009   Method used:   Print then Give to Patient   RxID:   [id number]

## 2010-10-21 LAB — DIFFERENTIAL
Basophils Relative: 1 % (ref 0–1)
Eosinophils Absolute: 0 10*3/uL (ref 0.0–0.7)
Eosinophils Relative: 0 % (ref 0–5)
Lymphs Abs: 0.3 10*3/uL — ABNORMAL LOW (ref 0.7–4.0)

## 2010-10-21 LAB — CBC
HCT: 44.7 % (ref 36.0–46.0)
MCHC: 33 g/dL (ref 30.0–36.0)
MCV: 89.2 fL (ref 78.0–100.0)
Platelets: 284 10*3/uL (ref 150–400)
RDW: 15.3 % (ref 11.5–15.5)
WBC: 5.6 10*3/uL (ref 4.0–10.5)

## 2010-10-21 LAB — BASIC METABOLIC PANEL
BUN: 10 mg/dL (ref 6–23)
CO2: 24 mEq/L (ref 19–32)
Chloride: 101 mEq/L (ref 96–112)
Glucose, Bld: 186 mg/dL — ABNORMAL HIGH (ref 70–99)
Potassium: 3.3 mEq/L — ABNORMAL LOW (ref 3.5–5.1)

## 2010-12-18 ENCOUNTER — Telehealth: Payer: Self-pay | Admitting: *Deleted

## 2010-12-18 NOTE — Telephone Encounter (Signed)
Pt returned call. i advised her (per msg in triage) that meds had been called in but that she would need cpx w/ sn before other refills. Pt scheduled for 02/14/11 for a cpx. Nothing further needed unless nurse needs to add anything. Tivis Ringer

## 2010-12-18 NOTE — Op Note (Signed)
NAME:  ENRIQUE, WEISS                  ACCOUNT NO.:  [id number]   MEDICAL RECORD NO.:  [id number]          PATIENT TYPE:  AMB   LOCATION:  DAY                          FACILITY[facility]  [facility]   PHYSICIAN:  Marlowe Kays, M.D.  DATE OF BIRTH:  1945/08/10   DATE OF PROCEDURE:  06/29/2007  DATE OF DISCHARGE:                               OPERATIVE REPORT   PREOPERATIVE DIAGNOSIS:  Right carpal tunnel syndrome.   POSTOPERATIVE DIAGNOSIS:  Right carpal tunnel syndrome.   OPERATION:  Decompression of median nerve, right wrist and hand.   SURGEON:  Marlowe Kays, M.D.   ASSISTANT:  Nurse.   ANESTHESIA:  IV regional.   JUSTIFICATION FOR PROCEDURE:  Roughly 9 years ago, I performed carpal  tunnel release on the left hand.  She has done well since that time and  has identical symptoms on the right and is here today for the above-  mentioned surgery.   PROCEDURE:  Satisfactory regional anesthesia, DuraPrep from midforearm  to fingertips, was draped in sterile field.  Timeout performed.  I  marked out a curved incision along the base of thenar eminence crossing  obliquely over the flexor crease of the wrist and the distal forearm.  The palmaris longus tendon was identified and retracted to the radial  side.  A number of venous bleeders were coagulated bipolar cautery.  I  then began progressive release of the skin, subcutaneous tissue, and  very thick fascia into the distal palm.  She had severe contusion,  discoloration, and flattening of the median nerve to the carpal canal.  The motor branch runoff was more vertically than normal.  It was noted  and protected.  I decompressed the nerve branches into the distal palm.  The wound was then irrigated with sterile saline.  The skin and  subcutaneous tissue were only closed with interrupted 4-0 nylon mattress  sutures.  Medium Adaptic dry sterile dressing and compressive splint  cast was applied.  Tourniquet was released.  At the time of this  dictation, she was on her way to recovery in satisfactory condition with  no known complications.           ______________________________  Marlowe Kays, M.D.     JA/MEDQ  D:  06/29/2007  T:  06/29/2007  Job:  [id number]

## 2010-12-18 NOTE — Telephone Encounter (Signed)
Faxed refill request received from [facility] for: simvastatin, klor con, singulair, triam/hctz, ranitidine, theophylline and albuterol tabs. Pt last seen in this office on 10/24/2009 and will need to schedule OV for CPX before refills are sent to her pharmacy. Medications have already been added to her med list.  LMOMTCB x 1.

## 2010-12-21 NOTE — Op Note (Signed)
[place]. [facility]  Patient:    Lisa Hamilton, Lisa Hamilton                         MRN: [id number] Proc. Date: 01/09/00 Adm. Date:  [id number] Disc. Date: [id number] Attending:  Sypher, Douglass Rivers CC:         Katy Fitch. Naaman Plummer., M.D.  Lonzo Cloud. Kriste Basque, M.D. [facility]   Operative Report  PREOPERATIVE DIAGNOSIS:  Comminuted intra-articular fracture, left distal radius.  POSTOPERATIVE DIAGNOSIS:  Comminuted intra-articular fracture, left distal radius.  OPERATION PERFORMED:  Closed reduction of left distal radius followed by application of an AG wrist jack external fixation device and Kirschner wire fixation of articular fragments of lunate facet.  SURGEON:  Katy Fitch. Sypher, Montez Hageman., M.D.  ASSISTANT:  Junius Roads. Ireton, P.A.C.  ANESTHESIA:  Axillary block.  SUPERVISING ANESTHESIOLOGIST:  Dr. Jacklynn Bue.  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  She was seen at the [facility] Department where x-rays revealed a fracture.  She requested a transfer for care with our office and was placed in a sugar tong splint.  She presented to our office the following day and was scheduled for elective reconstructive surgery at this time.  DESCRIPTION OF PROCEDURE:  Lisa Hamilton was brought to the operating room and placed in supine position on the operating table.  Following placement of an axillarly block in the holding area by Dr. Jacklynn Bue, anesthesia was satisfactory in the left arm.  The arm was prepped with Betadine soap and solution and sterilely draped.  Following exsanguination of the limb with an Esmarch bandage, the arterial tourniquet was inflated to 220 mmHg.  The procedure commenced with a closed reduction maneuver realigning the primary fracture fragments.  A  longitudinal incision was then fashioned over the radial border of the index metacarpal exposing its proximal metaphyseal flare.  After taking care to protect the radial artery and radial sensory branches, a long threaded Agee pin was placed across the base of the index and long finger metacarpals with standard technique.  Using a pin placement guide, a second pin was placed in the distal metaphysis of the index metacarpal.  This wound was then irrigated and repaired with intradermal 3-0 Prolene.  A second incision was then fashioned at the radial border of the radial midshaft taking care to identify the radial sensory branch.  The fascia overlying the brachial radialis was released and the brachioradialis retracted revealing the pronator insertion.  The pin guide was placed and with great care, two pins were placed perpendicular to the radius, taking care to aim at the subcutaneous border of the ulna.  This wound was then irrigated.  The safety of the radial nerve was assured followed by repair of the skin with intradermal 3-0 Prolene suture.  The Agee wrist jack frame was then applied and adjusted providing a virtually anatomic reduction of the comminuted radial fracture.  The lunate facet fracture fragment was manually reduced and secured with two 0.045 inch Kirschner wires.  Thereafter the tourniquet was released with excellent capillary refill in all digits. Traction was adjusted to prevent a tenodesis effect.  Full passive extension and flexion of the fingers was preserved.  The incisions were dressed with Xeroflo, sterile gauze and Webril followed by application of a  voluminous gauze dressing and Ace wrap.  There were no apparent complications.  The patient tolerated the surgery and anesthesia well and was transferred to the recovery room with stable vital signs.  She will return for follow-up in approximately five days for a dressing change and x-ray and appropriate adjustments  to her frame as needed. DD:  04/13/00 TD:  04/14/00 Job: [id number]

## 2011-01-02 ENCOUNTER — Telehealth: Payer: Self-pay | Admitting: Pulmonary Disease

## 2011-01-02 MED ORDER — MONTELUKAST SODIUM 10 MG PO TABS: 10.0000 mg | ORAL_TABLET | Freq: Every day | ORAL | Status: DC

## 2011-01-02 MED ORDER — THEOPHYLLINE 300 MG PO TB12: 300.0000 mg | ORAL_TABLET | Freq: Two times a day (BID) | ORAL | Status: DC

## 2011-01-02 MED ORDER — TRIAMTERENE-HCTZ 37.5-25 MG PO CAPS: 1.0000 | ORAL_CAPSULE | ORAL | Status: DC

## 2011-01-02 MED ORDER — SIMVASTATIN 40 MG PO TABS: 40.0000 mg | ORAL_TABLET | Freq: Every day | ORAL | Status: DC

## 2011-01-02 MED ORDER — ALBUTEROL SULFATE ER 4 MG PO TB12: 4.0000 mg | ORAL_TABLET | Freq: Two times a day (BID) | ORAL | Status: DC

## 2011-01-02 MED ORDER — RANITIDINE HCL 150 MG PO TABS: 150.0000 mg | ORAL_TABLET | Freq: Every day | ORAL | Status: DC

## 2011-01-02 MED ORDER — POTASSIUM CHLORIDE CRYS ER 20 MEQ PO TBCR: 20.0000 meq | EXTENDED_RELEASE_TABLET | Freq: Every day | ORAL | Status: DC

## 2011-01-02 NOTE — Telephone Encounter (Signed)
Spoke with pt and apologized for mistake but that I did send in refills this AM. Carron Curie, CMA

## 2011-01-02 NOTE — Telephone Encounter (Signed)
LMTCBx1. It does not look like the med were ever sent to pharmacy. Medication have been sent. LMTCBx1 to advise the pt. Carron Curie, CMA

## 2011-02-11 ENCOUNTER — Telehealth: Payer: Self-pay | Admitting: Pulmonary Disease

## 2011-02-11 NOTE — Telephone Encounter (Signed)
Order faxed to [facility] at number pt provided [place] with.   Called and spoke with pt.  Pt aware order faxed to [facility].

## 2011-02-11 NOTE — Telephone Encounter (Signed)
Cbc, bmet, tsh, udip , lipid , lfts  Make sure she follows up for OV Leitha Schuller

## 2011-02-11 NOTE — Telephone Encounter (Signed)
Pt has physical on 02-25-11 and is requesting order for labs be faxed to [facility]. Please advise on what labs to order. Thanks. Carron Curie, CMA

## 2011-02-14 ENCOUNTER — Encounter: Payer: Self-pay | Admitting: Pulmonary Disease

## 2011-02-25 ENCOUNTER — Encounter: Payer: Self-pay | Admitting: Pulmonary Disease

## 2011-02-25 ENCOUNTER — Ambulatory Visit (INDEPENDENT_AMBULATORY_CARE_PROVIDER_SITE_OTHER)
Admission: RE | Admit: 2011-02-25 | Discharge: 2011-02-25 | Disposition: A | Discharge status: Home / Self Care | Payer: 59 | Source: Ambulatory Visit | Attending: Pulmonary Disease | Admitting: Pulmonary Disease

## 2011-02-25 ENCOUNTER — Ambulatory Visit (INDEPENDENT_AMBULATORY_CARE_PROVIDER_SITE_OTHER): Payer: 59 | Admitting: Pulmonary Disease

## 2011-02-25 VITALS — BP 142/80 | HR 102 | Temp 98.9°F | Ht 61.0 in | Wt 223.8 lb

## 2011-02-25 DIAGNOSIS — Z0000 Encounter for general adult medical examination without abnormal findings: Secondary | ICD-10-CM

## 2011-02-25 DIAGNOSIS — I10 Essential (primary) hypertension: Secondary | ICD-10-CM

## 2011-02-25 DIAGNOSIS — E669 Obesity, unspecified: Secondary | ICD-10-CM

## 2011-02-25 DIAGNOSIS — J449 Chronic obstructive pulmonary disease, unspecified: Secondary | ICD-10-CM

## 2011-02-25 DIAGNOSIS — D126 Benign neoplasm of colon, unspecified: Secondary | ICD-10-CM

## 2011-02-25 DIAGNOSIS — Z87442 Personal history of urinary calculi: Secondary | ICD-10-CM

## 2011-02-25 DIAGNOSIS — K449 Diaphragmatic hernia without obstruction or gangrene: Secondary | ICD-10-CM

## 2011-02-25 DIAGNOSIS — M1990 Unspecified osteoarthritis, unspecified site: Secondary | ICD-10-CM

## 2011-02-25 DIAGNOSIS — K5730 Diverticulosis of large intestine without perforation or abscess without bleeding: Secondary | ICD-10-CM

## 2011-02-25 DIAGNOSIS — Q615 Medullary cystic kidney: Secondary | ICD-10-CM

## 2011-02-25 DIAGNOSIS — E042 Nontoxic multinodular goiter: Secondary | ICD-10-CM

## 2011-02-25 DIAGNOSIS — E7800 Pure hypercholesterolemia, unspecified: Secondary | ICD-10-CM

## 2011-02-25 DIAGNOSIS — E559 Vitamin D deficiency, unspecified: Secondary | ICD-10-CM

## 2011-02-25 DIAGNOSIS — I872 Venous insufficiency (chronic) (peripheral): Secondary | ICD-10-CM

## 2011-02-25 DIAGNOSIS — M810 Age-related osteoporosis without current pathological fracture: Secondary | ICD-10-CM

## 2011-02-25 MED ORDER — ALBUTEROL SULFATE HFA 108 (90 BASE) MCG/ACT IN AERS: 2.0000 | INHALATION_SPRAY | Freq: Four times a day (QID) | RESPIRATORY_TRACT | Status: DC | PRN

## 2011-02-25 MED ORDER — ALBUTEROL SULFATE (2.5 MG/3ML) 0.083% IN NEBU: 2.5000 mg | INHALATION_SOLUTION | Freq: Four times a day (QID) | RESPIRATORY_TRACT | Status: DC | PRN

## 2011-02-25 NOTE — Progress Notes (Signed)
Subjective:    Patient ID: Lisa Hamilton, female    DOB: [birth year], 65 y.o.   MRN: [id number]  HPI 65 y/o WF here for a follow up visit... she has multiple medical problems as noted below...   ~  Dec09:  she works for [employer] gets all her labs done there... she has had a good year without asthma exas etc... she noted a sl rash on left side of her mid-back x several days- this looks like left T9 shingles & we will Rx accordingly w/ Acyclovir & Medrol Dosepak...  ~  August 31, 2009:  she went to the ER 08/26/09 w/ asthma exac ppt by URI w/ cough, congestion, wheezing, tightness, incr SOB.Marland Kitchen. temp to 101, cough w/ green sputum, CWP from coughing... in the ER>> CXR showed incr markings w/o infiltrate, CBC= OK w/ WBC 5,600 (92% seg), Chem showed K3.3 & BS186... given Avelox, Pred taper, Cough syrup- sl better now... still congested & needs MUCINEX Bid/ Fluids/ etc... she had cut down Advair to 250, and not using the Nebulizer...  ~  September 26, 2009:  improved on the above Rx, back on nebs & Advair500... BP controlled on meds... Chol is OK on Simva40... still needs to get on track w/ diet & exercise program... she donates blood Q8wks at [facility]- CBC looks good... she had Flu shot 10/10, and requests 90d Rxs...  ~  February 25, 2011:  17mo ROV & she reports doing well w/o recent resp exac> denies much cough, sputum, SOB, ch in DOE, edema, etc;  Taking meds regularly & wants 90d refill prescriptions (see below);  She brought labs from [facility]> FLP, CBC, Chems, TSH- all look good;  We checked CXR (elev right hemidiaph, basilar atx, otherw NAD) & EKG (NSR, wnl);  Weight is 224# (up 5#) & despite everything she appears unable to lose weight;  See prob list below>>   Problem List:   PHYSICAL EXAMINATION (ICD-V70.0) - see attached pt summary note of her meds, physicians, etc... GYN= DrBowie & she is up-to-date... She gets the yearly Flu vaccine... had Pneumovax 9/02 (& repeat due after age 4)... had Tetanus  shot  6/01>> Tdap given today 09/26/09...  GLAUCOMA (ICD-365.9) - s/p glaucoma sury by [facility] in 1977 & followed by DrShapiro w/ surg yrs ago (no drops required & she sees Ophthal yearly).  COPD (ICD-496) & ASTHMATIC BRONCHITIS, ACUTE (ICD-466.0) - on THEO 300mg Bid,  ALBUTEROL 4mg Bid,  ADVAIR 500/50 Bid,  SINGULAIR 10mg /d,  NEBS w/ Albut Prn... her breathing has been doing well (until the 1/11 exacerbation- see above)... ~  baseline CXR= sl incr markings, NAD... ~  prev CT Chest 4/06 showed mild basilar atx, +multinod goiter, liver cysts, left renal calc, spurs, etc. ~  PFT's 3/08 showed FVC= 1.95 (71%), FEV1= 1.13 (56%), %1sec= 58, mid-flows= 20%pred, +mild air trapping & DLCO/VA= normal... ~  CXR 1/11 showed incr markings, sl elev right hemidiaph, NAD.Marland Kitchen. ~  CXR 7/12 showed elev right hemidiaph, basilar atx, otherw NAD...  HYPERTENSION (ICD-401.9) - on ASA 81mg /d,  DYAZIDE daily + KCl daily... BP= 142/80 today & better than this at home checks... denies HA, fatigue, visual changes, CP, palipit, dizziness, syncope, dyspnea, edema, etc...  ~  cardiac eval 2006 by DrWall- neg w/ 2DEcho showing mild MR, sm effusion, norm LV w/ EF=55%.  VENOUS INSUFFICIENCY (ICD-459.81) - she knows to avoid sodium, elevate legs, wear support hose.  HYPERCHOLESTEROLEMIA (ICD - on SIMVASTATIN 40mg /d,  Fish Oil 2/d,  CoQ10 daily... ~  FLP 2/07 off meds showed TChol 246, TG 151, HDL 56, LDL 160... rec- restart Lip10. ~  FLP 7/08 on Lip10 showed TChol 156, TG 96, HDL 56, LDL 81... continue same. ~  FLP 12/09 on Lip10 showed TChol 169, TG 95, HDL 59, LDL 91... continue same. ~  Insurance Co 11/10 rec change to SIMVASTATIN 40mg /d... ~  FLP 2/11 on Simva40 showed TChol 191, TG 138, HDL 65, LDL 98... continue same. ~  FLP 7/12 on Simva40 showed TChol 179, TG 133, HDL 64, LDL 88  NONTOXIC MULTINODULAR GOITER (ICD-241.1) - CT Chest 4/06 showed several thyroid nodules- largest= 1cm... clinically and biochem  euthyroid... ~  labs 3/08 showed TSH= 0.50 ~  labs 12/09 showed TSH= 1.08 ~  labs 2/11 showed TSH= 1.11 ~  Labs 7/12 showed TSH= 0.91  OBESITY (ICD-278.00) - range 200 - 220 over 10+ yrs. ~  weight 12/09 = 219#, up 5# in the last year... ~  weight 1/11 = 211# ~  Weight = 224#... She needs help, suggest wt watchers, etc...  HIATAL HERNIA (ICD-553.3) - on RANITADINE 150mg  Prn... he denies N/ V/ abd pain, etc...  DIVERTICULOSIS OF COLON (ICD-562.10) & COLONIC POLYPS (ICD-211.3) -  ~  colonoscopy 11/05 by DrPatterson showed divertics & 4mm polyp= hyperplastic... f/u planned 67yrs. ~  colonoscopy 12/10 showed 1 cecal polyp= tubular adenoma, f/u planned 67yrs.  Hx of HEPATIC CYST (ICD-573.8) - AbdSonar 4/06 showed mult large liver cysts, all simple cysts, & largest on dome of right lobe measures 11 cm...  Hx of MEDULLARY SPONGE KIDNEY (ICD-753.17) & RENAL CALCULUS, HX OF (ICD-V13.01) - followed by DrWrenn in past... she is asymptomatic & renal function normal... ~  labs 2/11 showed BUN= 18, Creat= 0.66 ~  Labs 7/12 showed abn UA, BUN= 12, Creat= 0.58  DEGENERATIVE JOINT DISEASE (ICD-715.90) - on DCN100 Prn for pain... she sees DrSypher & DrAplington in the past- s/p bilat wrist fx: right 1996, left 2001... s/p bilat CTS surg: left 1999, right 2008...  OSTEOPOROSIS (ICD-733.00) - on ALENDRONATE 70mg /wk per DrBowie... plus Calcium, MVI and Vit D 1000u daily... ~  BMD 3/08 at [facility] showed TScores -2.8 in Spine, -1.5 in left [facility]...  VITAMIN D DEFICIENCY (ICD-268.9) - as above.  Hx of RESTLESS LEG SYNDROME (ICD-333.94)  LIPOMA (ICD-214.9)  SHINGLES (ICD-053.9) - 12/09 involving left T9 distrib.  BLOOD DONOR - she donates blood at the bloodmobile Q8w[facility]; her H&H remains WNL.Marland KitchenMarland Kitchen   Past Surgical History  Procedure Date  . Hernia repair 1963  . Tonsillectomy 1967  . Glaucoma surgery 1977  . Bilateral wrist fracture, right 1996    left 2001, Dr. Teressa Senter  . Bilateral carpel  tunnel repairs     Dr. Simonne Come  left--1999   right--2008    Outpatient Encounter Prescriptions as of 02/25/2011  Medication Sig Dispense Refill  . albuterol (PROAIR HFA) 108 (90 BASE) MCG/ACT inhaler Inhale 2 puffs into the lungs every 6 (six) hours as needed.        Marland Kitchen albuterol (PROVENTIL) (2.5 MG/3ML) 0.083% nebulizer solution Take 2.5 mg by nebulization every 6 (six) hours as needed.        Marland Kitchen albuterol (VOSPIRE ER) 4 MG 12 hr tablet Take 1 tablet (4 mg total) by mouth every 12 (twelve) hours.  180 tablet  3  . alendronate (FOSAMAX) 70 MG tablet Take 70 mg by mouth every 7 (seven) days. Take with a full glass of water on an empty stomach.       Marland Kitchen  aspirin 81 MG tablet Take 81 mg by mouth daily.        . Calcium Carbonate-Vitamin D (CALCIUM-VITAMIN D) 500-200 MG-UNIT per tablet Take 2 tablets by mouth daily.        . Cholecalciferol (VITAMIN D) 1000 UNITS capsule Take 1,000 Units by mouth daily.        Marland Kitchen co-enzyme Q-10 30 MG capsule Take 30 mg by mouth daily.        . Fluticasone-Salmeterol (ADVAIR DISKUS) 500-50 MCG/DOSE AEPB Inhale 1 puff into the lungs every 12 (twelve) hours.        . montelukast (SINGULAIR) 10 MG tablet Take 1 tablet (10 mg total) by mouth at bedtime.  90 tablet  3  . Multiple Vitamin (MULTIVITAMIN) capsule Take 1 capsule by mouth daily.        . Omega-3 Fatty Acids (FISH OIL) 1200 MG CAPS Take 2 capsules by mouth daily.        . potassium chloride SA (K-DUR,KLOR-CON) 20 MEQ tablet Take 1 tablet (20 mEq total) by mouth daily.  90 tablet  3  . ranitidine (ZANTAC) 150 MG tablet Take 1 tablet (150 mg total) by mouth at bedtime.  90 tablet  3  . simvastatin (ZOCOR) 40 MG tablet Take 1 tablet (40 mg total) by mouth at bedtime.  90 tablet  3  . theophylline (THEOPHYLLINE) 300 MG 12 hr tablet Take 1 tablet (300 mg total) by mouth 2 (two) times daily.  180 tablet  3  . traMADol (ULTRAM) 50 MG tablet Take 50 mg by mouth every 6 (six) hours as needed.        .  triamterene-hydrochlorothiazide (DYAZIDE) 37.5-25 MG per capsule Take 1 capsule by mouth every morning.  90 capsule  3    Allergies  Allergen Reactions  . Codeine     REACTION: nightmares  . WUJ:[id number]+AOZHYQMVH+QIONGEXBMW Acid+Aspartame     REACTION: rash    Current Medications, Allergies, Past Medical History, Past Surgical History, Family History, and Social History were reviewed in [employer] record.   Review of Systems        The patient complains of nasal congestion, dyspnea on exertion, change in bowel habits, indigestion/heartburn, joint pain, stiffness, and anxiety.  The patient denies fever, chills, sweats, anorexia, fatigue, weakness, malaise, weight loss, sleep disorder, blurring, diplopia, eye irritation, eye discharge, vision loss, eye pain, photophobia, earache, ear discharge, tinnitus, decreased hearing, nosebleeds, sore throat, hoarseness, chest pain, palpitations, syncope, orthopnea, PND, peripheral edema, cough, dyspnea at rest, excessive sputum, hemoptysis, wheezing, pleurisy, nausea, vomiting, diarrhea, constipation, abdominal pain, melena, hematochezia, jaundice, gas/bloating, dysphagia, odynophagia, dysuria, hematuria, urinary frequency, urinary hesitancy, nocturia, incontinence, back pain, joint swelling, muscle cramps, muscle weakness, arthritis, sciatica, restless legs, leg pain at night, leg pain with exertion, rash, itching, dryness, suspicious lesions, paralysis, paresthesias, seizures, tremors, vertigo, transient blindness, frequent falls, frequent headaches, difficulty walking, depression, memory loss, confusion, cold intolerance, heat intolerance, polydipsia, polyphagia, polyuria, unusual weight change, abnormal bruising, bleeding, enlarged lymph nodes, urticaria, allergic rash, hay fever, and recurrent infections.     Objective:   Physical Exam      WD, overweight, 65 y/o WF in NAD... GENERAL:  Alert & oriented; pleasant &  cooperative... HEENT:  [place]/AT, EOM-wnl, PERRLA, EACs-clear, TMs-wnl, NOSE-clear, THROAT-clear & wnl. NECK:  Supple w/ fairROM; no JVD; normal carotid impulses w/o bruits; no thyromegaly or nodules palpated; no lymphadenopathy. CHEST:  Clear to P & A; without wheezes/ rales/ or rhonchi heard... HEART:  Regular Rhythm;  without murmurs/ rubs/ or gallops detedted... ABDOMEN:  Soft & nontender; normal bowel sounds; no organomegaly or masses palpated... EXT: without deformities, mild arthritic changes; no varicose veins/ +venous insuffic/ no edema. NEURO:  CN's intact; motor testing normal; sensory testing normal; gait normal & balance OK. DERM:  mild shingles rash on back- left T9 distrib...   Assessment & Plan:   COPD/ AB>  She has severe disease but very stable on her regular med regimen;  Hasn't required acute Rx or Pred in >38yr & doing satis, continue same...  HBP>  Controlled on meds;  Continue same, needs to lose weight...  Ven Insuffic>  Stable, continue low sodium, elevation, support hose & diuretic Rx...  CHOL>  Stable on Simva40, needs better diet & wt reduction...  OBESITY>  She hasn't been successful w/ wt reduction program> rec wt watchers etc...  THYROID>  Non toxic multinod thyroid ==> stable & euthyroid...  GI> HH, Divertics, Polyps, Hep cyst>  Stable & up to date on screening...  GU> medullary sponge kidney, hx stones>  Stable w/ norm renal function...  DJD>  Needs to get wt down, try Tramadol...  Osteoporosis>  BMDs monitored by GYN, DrBowie who has her off the Alendronate for now (drug holiday)...  Other medical issues as noted.Marland KitchenMarland Kitchen

## 2011-02-25 NOTE — Patient Instructions (Signed)
Today we updated your med list in EPIC...    We refilled the meds you requested...  Today we did your follow up CXR & EKG...    Please call the PHONE TREE in a few days for your results...    Dial [id number] & when prompted enter your patient number followed by the # symbol...    Your patient number is:  [id number]#  Let's get on track w/ our diet & exercise program...    Try to lose the wt in 10 lb increments & keep on going!!!  Call for any questions...  Let's plan a follow up visit in 74yr, sooner if needed for problems.Marland KitchenMarland Kitchen

## 2011-02-27 ENCOUNTER — Telehealth: Payer: Self-pay | Admitting: Pulmonary Disease

## 2011-02-27 MED ORDER — FLUTICASONE-SALMETEROL 500-50 MCG/DOSE IN AEPB: 1.0000 | INHALATION_SPRAY | Freq: Two times a day (BID) | RESPIRATORY_TRACT | Status: DC

## 2011-02-27 MED ORDER — TRAMADOL HCL 50 MG PO TABS: 50.0000 mg | ORAL_TABLET | Freq: Four times a day (QID) | ORAL | Status: DC | PRN

## 2011-02-27 NOTE — Telephone Encounter (Signed)
LMTCB-need to let her know that Rx's was sent

## 2011-02-27 NOTE — Telephone Encounter (Signed)
lmomtcb  

## 2011-02-27 NOTE — Telephone Encounter (Signed)
Just want to clarify- pt needs a 90 day supply of tramadol b/c this is going to mail order pharmacy. Pls advise if this is okay to send or if he is not willing to do a 90 day supply of this thanks

## 2011-02-27 NOTE — Telephone Encounter (Signed)
Per SN---ok to give pt 90 day supply of the tramadol---#270 with 3 refills. thanks

## 2011-02-27 NOTE — Telephone Encounter (Signed)
PATIENT RETURNED LESLIE'S CALL.  PLEASE CALL BACK

## 2011-02-27 NOTE — Telephone Encounter (Signed)
Ok to send #[phone] for the tramadol for refills.  thanks

## 2011-02-27 NOTE — Telephone Encounter (Signed)
Called and spoke with pt.  Pt was just seen by SN on 7/23.  Pt is requesting 90 day rx x 3 refills on Advair and Tramadol be faxed to [facility].  Please advise if ok to send the Tramadol rx or not.  Thanks.

## 2011-02-28 NOTE — Telephone Encounter (Signed)
PATIENT HAS BEEN INFORMED.

## 2011-03-02 ENCOUNTER — Encounter: Payer: Self-pay | Admitting: Pulmonary Disease

## 2011-03-07 ENCOUNTER — Telehealth: Payer: Self-pay | Admitting: Pulmonary Disease

## 2011-03-07 NOTE — Telephone Encounter (Signed)
Called and spoke with pts mail order pharmacy and they stated the reason they called was due to the pt having an allergy to codeine.  Pt has been on the tramadol and has used this tramadol with no problems.  Spoke with the pharmacist to confirm that this was ok to give to the pt.

## 2011-03-12 ENCOUNTER — Encounter: Payer: Self-pay | Admitting: Pulmonary Disease

## 2011-05-06 HISTORY — PX: LAPAROSCOPIC LIVER CYST FENESTRATION: SUR776

## 2011-05-14 LAB — BASIC METABOLIC PANEL
Chloride: 109
GFR calc non Af Amer: >60
Potassium: 3.9
Sodium: 145

## 2011-05-14 LAB — HEMOGLOBIN AND HEMATOCRIT, BLOOD
HCT: 38.8
Hemoglobin: 12.9

## 2011-05-17 ENCOUNTER — Ambulatory Visit (INDEPENDENT_AMBULATORY_CARE_PROVIDER_SITE_OTHER): Payer: 59 | Admitting: General Surgery

## 2011-05-17 ENCOUNTER — Encounter (INDEPENDENT_AMBULATORY_CARE_PROVIDER_SITE_OTHER): Payer: Self-pay | Admitting: General Surgery

## 2011-05-17 DIAGNOSIS — J449 Chronic obstructive pulmonary disease, unspecified: Secondary | ICD-10-CM

## 2011-05-17 DIAGNOSIS — K7689 Other specified diseases of liver: Secondary | ICD-10-CM

## 2011-05-17 DIAGNOSIS — J4489 Other specified chronic obstructive pulmonary disease: Secondary | ICD-10-CM

## 2011-05-17 NOTE — Assessment & Plan Note (Signed)
Very large hepatic cysts, symptomatic. Right sided one is pushing up diaphragm significantly.   Plan OR for laparoscopic cyst unroofing.   Pt to stop aspirin.   She will increase nebulizers to 4x/day. Discussed risk of surgery including bleeding, infection, damage to adjacent structures, possibility of open operation, possibility of takeback to the OR, possible pneumonia or heart arrhythymias, possible prolonged hospital stay, possible home oxygen, etc. Pt wishes to proceed.

## 2011-05-17 NOTE — Patient Instructions (Signed)
Increase nebulizers to 4 times/day. Stop aspirin. See OR schedulers.

## 2011-05-17 NOTE — Progress Notes (Signed)
Chief Complaint  Patient presents with  . Other    new pt eval of hepatic cyst     HISTORY: Pt has history of hepatic cysts since 2006, seen on CT for the heart.  Ultrasound confirmed this, lesions were simple, asymptomatic.  She started having R flank and abd pain around 2 months ago that is moderate to severe.  It is worse in the evening or after strenuous activity.  She is unable to lay on her right side.  She has started having to sleep in the recliner since this pain started.  She denies nausea/vomiting, early satiety.  She does have constipation sometimes.    Past Medical History  Diagnosis Date  . Glaucoma   . COPD (chronic obstructive pulmonary disease)   . Asthmatic bronchitis   . Hypertension   . Venous insufficiency   . Hypercholesteremia   . Nontoxic multinodular goiter   . Obesity   . Hiatal hernia   . Diverticulosis of colon   . Hx of colonic polyps   . Hepatic cyst   . Medullary sponge kidney   . Renal calculus   . DJD (degenerative joint disease)   . Osteoporosis   . Vitamin D deficiency   . Restless leg syndrome   . Lipoma   . Shingles   . Hepatic cyst   . Seizures   . Wheezing   . Chest pain   . Leg swelling   . Abdominal pain   . Abdominal distention   . Rectal bleeding   . Constipation   . Difficulty urinating   . Easy bruising     Past Surgical History  Procedure Date  . Hernia repair 1963  . Tonsillectomy 1967  . Glaucoma surgery 1977  . Bilateral wrist fracture, right 1996    left 2001, Dr. Teressa Senter  . Bilateral carpel tunnel repairs     Dr. Simonne Come  left--1999   right--2008  . Squamous cell carcinoma excision 2010    Current Outpatient Prescriptions  Medication Sig Dispense Refill  . albuterol (PROAIR HFA) 108 (90 BASE) MCG/ACT inhaler Inhale 2 puffs into the lungs every 6 (six) hours as needed.  3 Inhaler  3  . albuterol (PROVENTIL) (2.5 MG/3ML) 0.083% nebulizer solution Take 2.5 mg by nebulization every 6 (six) hours as needed.  360  vial  3  . albuterol (VOSPIRE ER) 4 MG 12 hr tablet Take 1 tablet (4 mg total) by mouth every 12 (twelve) hours.  180 tablet  3  . aspirin 81 MG tablet Take 2 tablets by mouth at bedtime      . Calcium Carbonate-Vitamin D (CALCIUM-VITAMIN D) 500-200 MG-UNIT per tablet Take 2 tablets by mouth daily.        . Cholecalciferol (VITAMIN D) 1000 UNITS capsule Take 1,000 Units by mouth daily.        Marland Kitchen co-enzyme Q-10 30 MG capsule Take 30 mg by mouth daily.        . Fluticasone-Salmeterol (ADVAIR DISKUS) 500-50 MCG/DOSE AEPB Inhale 1 puff into the lungs every 12 (twelve) hours.  3 each  3  . montelukast (SINGULAIR) 10 MG tablet Take 1 tablet (10 mg total) by mouth at bedtime.  90 tablet  3  . Multiple Vitamin (MULTIVITAMIN) capsule Take 1 capsule by mouth daily.        . Omega-3 Fatty Acids (FISH OIL) 1200 MG CAPS Take 2 capsules by mouth daily.        . potassium chloride SA (K-DUR,KLOR-CON) 20 MEQ  tablet Take 1 tablet (20 mEq total) by mouth daily.  90 tablet  3  . ranitidine (ZANTAC) 150 MG tablet Take 1 tablet (150 mg total) by mouth at bedtime.  90 tablet  3  . simvastatin (ZOCOR) 40 MG tablet Take 1 tablet (40 mg total) by mouth at bedtime.  90 tablet  3  . theophylline (THEOPHYLLINE) 300 MG 12 hr tablet Take 1 tablet (300 mg total) by mouth 2 (two) times daily.  180 tablet  3  . traMADol (ULTRAM) 50 MG tablet Take 1 tablet (50 mg total) by mouth every 6 (six) hours as needed.  270 tablet  3  . triamterene-hydrochlorothiazide (DYAZIDE) 37.5-25 MG per capsule Take 1 capsule by mouth every morning.  90 capsule  3     Allergies  Allergen Reactions  . Codeine     REACTION: nightmares  . JYN:WGNFAOZHYQM+VHQIONGEX+BMWUXLKGMW Acid+Aspartame     REACTION: rash     History reviewed. No pertinent family history.   History   Social History  . Marital Status: Married    Spouse Name: Peyton Najjar    Number of Children: 2  . Years of Education: N/A   Occupational History  . Quarry manager for  [employer]    Social History Main Topics  . Smoking status: Never Smoker   . Smokeless tobacco: Never Used  . Alcohol Use: No     social use  . Drug Use: No  . Sexually Active: None   Other Topics Concern  . None   Social History Narrative  . None    REVIEW OF SYSTEMS - PERTINENT POSITIVES ONLY: 12 point review of systems negative other than HPI and PMH except for difficulty urinating, arthritis pains, easy bruising.  Occasional leg swelling.  Occasional wheezing.    EXAMCeasar Mons Vitals:   05/17/11 0931  BP: 158/88  Pulse: 72  Temp: 98.4 F (36.9 C)  Resp: 20    Gen:  No acute distress.  Well nourished and well groomed.   Neurological: Alert and oriented to person, place, and time. Coordination normal.  Head: Normocephalic and atraumatic.  Eyes: Conjunctivae are normal. Pupils are equal, round, and reactive to light. No scleral icterus.  Neck: Normal range of motion. Neck supple. No tracheal deviation or thyromegaly present.  Cardiovascular: Normal rate, regular rhythm, normal heart sounds and intact distal pulses.  Exam reveals no gallop and no friction rub.  No murmur heard. Respiratory: Effort normal.  No respiratory distress. No chest wall tenderness. Breath sounds normal.  No wheezes, rales or rhonchi.  GI: Soft. Bowel sounds are normal. There is some RUQ pain, the liver is enlarged and tender.  There is no rebound and no guarding.  Musculoskeletal: Normal range of motion. Extremities are nontender.  Lymphadenopathy: No cervical, preauricular, postauricular or axillary adenopathy is present Skin: Skin is warm and dry. No rash noted. No diaphoresis. No erythema. No pallor. No clubbing, cyanosis, or edema.   Psychiatric: Normal mood and affect. Behavior is normal. Judgment and thought content normal.    LABORATORY RESULTS: Available labs are reviewed from July.   RADIOLOGY RESULTS: See E-Chart or I-Site for most recent results.  Images and reports are  reviewed.   HEPATIC CYST Very large hepatic cysts, symptomatic. Right sided one is pushing up diaphragm significantly.   Plan OR for laparoscopic cyst unroofing.   Pt to stop aspirin.   She will increase nebulizers to 4x/day. Discussed risk of surgery including bleeding, infection, damage to adjacent structures, possibility of open  operation, possibility of takeback to the OR, possible pneumonia or heart arrhythymias, possible prolonged hospital stay, possible home oxygen, etc. Pt wishes to proceed.    COPD Increase nebulizers to 4 times a day pre op. Will go to stepdown or ICU post op. Aggressive pulmonary toilet.      Maudry Diego MD Surgical Oncology, General and Endocrine Surgery [facility]      Visit Diagnoses: 1. HEPATIC CYST   2. COPD     Primary Care Physician: Michele Mcalpine, MD, MD

## 2011-05-17 NOTE — Assessment & Plan Note (Signed)
Increase nebulizers to 4 times a day pre op. Will go to stepdown or ICU post op. Aggressive pulmonary toilet.

## 2011-05-20 ENCOUNTER — Encounter (HOSPITAL_COMMUNITY)
Admission: RE | Admit: 2011-05-20 | Discharge: 2011-05-20 | Disposition: A | Discharge status: Home / Self Care | Payer: 59 | Source: Ambulatory Visit | Attending: General Surgery | Admitting: General Surgery

## 2011-05-20 LAB — COMPREHENSIVE METABOLIC PANEL
ALT: 36 U/L — ABNORMAL HIGH (ref 0–35)
AST: 31 U/L (ref 0–37)
Albumin: 3.7 g/dL (ref 3.5–5.2)
Calcium: 9.8 mg/dL (ref 8.4–10.5)
Chloride: 104 mEq/L (ref 96–112)
Creatinine, Ser: 0.58 mg/dL (ref 0.50–1.10)
Sodium: 141 mEq/L (ref 135–145)

## 2011-05-20 LAB — CBC
Hemoglobin: 13.9 g/dL (ref 12.0–15.0)
Platelets: 336 10*3/uL (ref 150–400)
RBC: 4.88 MIL/uL (ref 3.87–5.11)
WBC: 10.2 10*3/uL (ref 4.0–10.5)

## 2011-05-20 LAB — URINE MICROSCOPIC-ADD ON

## 2011-05-20 LAB — ABO/RH: ABO/RH(D): O POS

## 2011-05-20 LAB — URINALYSIS, ROUTINE W REFLEX MICROSCOPIC
Glucose, UA: NEGATIVE mg/dL
Hgb urine dipstick: NEGATIVE
Ketones, ur: NEGATIVE mg/dL
pH: 7.5 (ref 5.0–8.0)

## 2011-05-20 LAB — SURGICAL PCR SCREEN: Staphylococcus aureus: NEGATIVE

## 2011-05-20 LAB — PROTIME-INR
INR: 1.1 (ref 0.00–1.49)
Prothrombin Time: 14.4 seconds (ref 11.6–15.2)

## 2011-05-20 LAB — APTT: aPTT: 34 seconds (ref 24–37)

## 2011-05-21 ENCOUNTER — Inpatient Hospital Stay (HOSPITAL_COMMUNITY)
Admission: RE | Admit: 2011-05-21 | Discharge: 2011-05-24 | DRG: 407 | Disposition: A | Discharge status: Home / Self Care | Payer: 59 | Source: Ambulatory Visit | Attending: General Surgery | Admitting: General Surgery

## 2011-05-21 ENCOUNTER — Other Ambulatory Visit (INDEPENDENT_AMBULATORY_CARE_PROVIDER_SITE_OTHER): Payer: Self-pay | Admitting: General Surgery

## 2011-05-21 DIAGNOSIS — K429 Umbilical hernia without obstruction or gangrene: Secondary | ICD-10-CM | POA: Diagnosis present

## 2011-05-21 DIAGNOSIS — I872 Venous insufficiency (chronic) (peripheral): Secondary | ICD-10-CM | POA: Diagnosis present

## 2011-05-21 DIAGNOSIS — K7689 Other specified diseases of liver: Secondary | ICD-10-CM

## 2011-05-21 DIAGNOSIS — E876 Hypokalemia: Secondary | ICD-10-CM | POA: Diagnosis present

## 2011-05-21 DIAGNOSIS — J4489 Other specified chronic obstructive pulmonary disease: Secondary | ICD-10-CM | POA: Diagnosis present

## 2011-05-21 DIAGNOSIS — M810 Age-related osteoporosis without current pathological fracture: Secondary | ICD-10-CM | POA: Diagnosis present

## 2011-05-21 DIAGNOSIS — E7800 Pure hypercholesterolemia, unspecified: Secondary | ICD-10-CM | POA: Diagnosis present

## 2011-05-21 DIAGNOSIS — I10 Essential (primary) hypertension: Secondary | ICD-10-CM | POA: Diagnosis present

## 2011-05-21 DIAGNOSIS — K449 Diaphragmatic hernia without obstruction or gangrene: Secondary | ICD-10-CM | POA: Diagnosis present

## 2011-05-21 DIAGNOSIS — E669 Obesity, unspecified: Secondary | ICD-10-CM | POA: Diagnosis present

## 2011-05-21 DIAGNOSIS — M1990 Unspecified osteoarthritis, unspecified site: Secondary | ICD-10-CM | POA: Diagnosis present

## 2011-05-21 DIAGNOSIS — Z860100 Personal history of colon polyps, unspecified: Secondary | ICD-10-CM

## 2011-05-21 DIAGNOSIS — Z79899 Other long term (current) drug therapy: Secondary | ICD-10-CM

## 2011-05-21 DIAGNOSIS — H409 Unspecified glaucoma: Secondary | ICD-10-CM | POA: Diagnosis present

## 2011-05-21 DIAGNOSIS — G2581 Restless legs syndrome: Secondary | ICD-10-CM | POA: Diagnosis present

## 2011-05-21 DIAGNOSIS — J449 Chronic obstructive pulmonary disease, unspecified: Secondary | ICD-10-CM | POA: Diagnosis present

## 2011-05-21 DIAGNOSIS — Z01812 Encounter for preprocedural laboratory examination: Secondary | ICD-10-CM

## 2011-05-21 DIAGNOSIS — K219 Gastro-esophageal reflux disease without esophagitis: Secondary | ICD-10-CM | POA: Diagnosis present

## 2011-05-21 NOTE — Op Note (Signed)
NAME:  Lisa Hamilton, Lisa Hamilton                  ACCOUNT NO.:  [id number]  MEDICAL RECORD NO.:  [id number]  LOCATION:  2550                         FACILITY:  [facility]  PHYSICIAN:  Almond Lint, MD       DATE OF BIRTH:  [birth year]  DATE OF PROCEDURE:  05/21/2011 DATE OF DISCHARGE:                              OPERATIVE REPORT   PREOPERATIVE DIAGNOSIS:  Massive right hepatic liver cyst and moderate- sized left hepatic liver cyst.  POSTOPERATIVE DIAGNOSIS:  Massive right hepatic liver cyst and moderate- sized left hepatic liver cyst.  PROCEDURE:  Laparoscopic unroofing of bilobar liver cysts and umbilical hernia repair.  SURGEON:  Almond Lint, MD  ANESTHESIA:  General and local.  FINDINGS:  25-30 cm cyst in segment 8 of liver and moderate-sized 10-12 cm cyst in segment 3.  SPECIMENS:  Cyst wall and cyst fluid to Pathology and Cytology respectively.  ESTIMATED BLOOD LOSS:  Minimal.  Fluid loss from the liver cyst was around 4800 mL of fluid.  COMPLICATIONS:  None known.  PROCEDURE:  Lisa Hamilton was identified in the holding area and taken to the operating room where she was placed supine on the operating room table. General anesthesia was induced.  She was placed into a semi-lateral position at about 30-45 degrees on a bean-bag.  Her right arm was draped across her chest and was well-padded.  Her hips were left near supine. Her abdomen was prepped and draped in sterile fashion.  Time-out was performed according to the surgical safety checklist.  When all was correct, we continued.    The infraumbilical skin was anesthetized with local anesthetic and a transverse curvilinear incision was made with an 11-blade.  The subcutaneous tissues were spread with a Tresa Endo.  The umbilical fascia was elevated and opened with a #11 blade.  A 0-Vicryl pursestring suture was placed around the fascial incision.  The Hasson trocar was introduced into the abdomen and held in place to the abdominal wall  with the tails of suture.  Pneumoperitoneum was achieved to a pressure of 15 mmHg.  The patient was placed into reverse Trendelenburg position and rotated to the left.  Three additional trocars were placed in the right upper quadrant under direct visualization.  These were 5 mm trocars.    The cyst was identified and opened with Harmonic Scalpel.  3200 mL of serous fluid were aspirated from the cyst.  The cyst wall was then elevated and the anterior wall was divided with Harmonic.  The liver was mobilized taking it off the retroperitoneum.  Care was taken not to injure the adrenal gland and not to get close to the vena cava.  The posterior cyst wall was stapled off with the powered echelon vascular loads.  Once the cyst wall was taken off as much as possible, this was retrieved through the umbilical incision in an EndoCatch bag.    Attention was directed to the left lobe. High up in the left lateral segment, the second cyst was identified and this was opened up as well.  Fluid was aspirated from this.  This also was unroofed with the Harmonic and with stapler.  Once the wall was completely  detached, this was retrieved through the umbilicus with an EndoCatch bag.    The patient had an umbilical hernia and the omentum was taken down off the hernia with the Harmonic Scalpel.  A four-quadrant inspection was then performed demonstrating no evidence of bleeding. There was no significant fluid remaining in the pelvis.  The Hasson was removed and the fascia was tied down.  The hernia defect was then closed with a figure-of-eight suture of 0-Vicryl.  An additional 0-Vicryl was placed inferior to the first one.  There was no residual palpable defect present.    The pneumoperitoneum was released as the remaining 5-mm trocars were removed.  The skin of all the incisions was closed with 4-0 Monocryl in subcuticular fashion.  At the umbilicus the skin was tacked down to the fascia to try and  eliminate some of the skin.  This was done with a 2-0 Vicryl.  Once all incisions were closed, they were cleaned, dried, and dressed with Dermabond.  The patient was awaken from anesthesia and extubated and taken to the PACU in stable condition. Needle and sponge counts were correct x2.     Almond Lint, MD     FB/MEDQ  D:  05/21/2011  T:  05/21/2011  Job:  [id number]  Electronically Signed by Almond Lint MD on 05/21/2011 08:17:28 PM

## 2011-05-22 LAB — CBC
Hemoglobin: 13.7 g/dL (ref 12.0–15.0)
RBC: 4.8 MIL/uL (ref 3.87–5.11)

## 2011-05-22 LAB — BASIC METABOLIC PANEL
CO2: 26 mEq/L (ref 19–32)
Chloride: 103 mEq/L (ref 96–112)
Glucose, Bld: 145 mg/dL — ABNORMAL HIGH (ref 70–99)
Potassium: 3.4 mEq/L — ABNORMAL LOW (ref 3.5–5.1)
Sodium: 137 mEq/L (ref 135–145)

## 2011-05-23 ENCOUNTER — Telehealth (INDEPENDENT_AMBULATORY_CARE_PROVIDER_SITE_OTHER): Payer: Self-pay

## 2011-05-23 DIAGNOSIS — J45909 Unspecified asthma, uncomplicated: Secondary | ICD-10-CM

## 2011-05-23 LAB — BASIC METABOLIC PANEL
CO2: 27 mEq/L (ref 19–32)
Calcium: 8.7 mg/dL (ref 8.4–10.5)
Creatinine, Ser: 0.57 mg/dL (ref 0.50–1.10)
Glucose, Bld: 112 mg/dL — ABNORMAL HIGH (ref 70–99)
Sodium: 137 mEq/L (ref 135–145)

## 2011-05-23 LAB — CBC
HCT: 42.4 % (ref 36.0–46.0)
Hemoglobin: 13.4 g/dL (ref 12.0–15.0)
MCH: 28.4 pg (ref 26.0–34.0)
MCV: 89.8 fL (ref 78.0–100.0)
RBC: 4.72 MIL/uL (ref 3.87–5.11)

## 2011-05-23 LAB — CROSSMATCH
ABO/RH(D): O POS
Antibody Screen: NEGATIVE
Unit division: 0

## 2011-05-23 NOTE — Telephone Encounter (Signed)
[facility] pathology is benign.

## 2011-05-29 NOTE — Discharge Summary (Signed)
  NAMEMarland Kitchen  Lisa Hamilton, Lisa Hamilton                  ACCOUNT NO.:  [id number]  MEDICAL RECORD NO.:  [id number]  LOCATION:  5151                         FACILITY:  [facility]  PHYSICIAN:  Almond Lint, MD       DATE OF BIRTH:  Apr 21, 1946  DATE OF ADMISSION:  05/21/2011 DATE OF DISCHARGE:                              DISCHARGE SUMMARY   PRINCIPAL DIAGNOSIS:  Massive liver cyst.  PRINCIPAL PROCEDURE:  Laparoscopic liver cyst unroofing x2 on May 21, 2011.  SECONDARY DIAGNOSES: 1. Hypokalemia. 2. Asthma. 3. History of goiter. 4. Hypercholesterolemia. 5. Obesity. 6. Restless legs syndrome. 7. Glaucoma. 8. Hypertension. 9. Venous insufficiency. 10.Chronic obstructive pulmonary disease. 11.Hiatal hernia. 12.Degenerative joint disease. 13.Osteoporosis.  DISCHARGE MEDICATIONS: 1. Simvastatin 10 mg once a day. 2. Ranitidine 150 mg once a day. 3. Singulair 10 mg once a day at times. 4. Tramadol 50 mg 3 times a day as needed for pain. 5. Percocet 5/325, 1-2 tablets p.o. q.4 hours p.r.n. pain. 6. Maxzide 37.5/25 one tablet once a day. 7. Potassium chloride 60 mEq p.o. daily x10 days, then back to the     home dose of 20 mEq once a day. 8. Theophylline 300 mg twice a day. 9. Advair Diskus 500/50 one puff b.i.d. 10.Albuterol inhaler 2 puffs every 4 hours as needed for shortness of     breath. 11.Albuterol nebulizer 2 puffs 4 times a day as needed. 12.Albuterol sulfate 4 mg p.o. twice a day.  DISCHARGE INSTRUCTIONS:  No heavy lifting for 3-4 weeks.  No driving for 1-2 weeks.  May shower once she is at home.  Diet, heart healthy. Follow up with me in 3-4 weeks.  Follow up with primary care in 2 weeks to evaluate potassium and asthma.  HOSPITAL COURSE:  Lisa Hamilton was admitted to the ICU following her surgery because of her severe COPD.  She did very well with her incentive spirometry and pulmonary toilet.  She was transferred to step-down on postop day #1.  She continued to do well, getting up and  getting out of bed.  She has had to have an in and out catheterization once for urinary retention, but this resolved.  On postop day #2, she was fairly bloated and slightly nauseated.  She was transferred to the floor.  By postop day #3, she was having bowel movements and passing gas and able to tolerate her regular diet.  She is able to be discharged to home in improved condition.  PERTINENT LABS:  She did have hypokalemia with a potassium in the low 3s throughout her stay.  She is going to be discharged home on a higher dose of potassium than she has at home.  Her hematocrit has been stable with a discharge hematocrit of 42.4.  She is to call for fevers, chills, nausea, vomiting, or drainage from her wounds.     Almond Lint, MD     FB/MEDQ  D:  05/24/2011  T:  05/24/2011  Job:  [id number]  Electronically Signed by Almond Lint MD on 05/29/2011 03:05:52 PM

## 2011-05-30 ENCOUNTER — Telehealth (INDEPENDENT_AMBULATORY_CARE_PROVIDER_SITE_OTHER): Payer: Self-pay

## 2011-05-30 NOTE — Telephone Encounter (Signed)
Pam at Dr Belva Crome office called to ok if pt can have lithotripsy for kidney stones 06-24-11. Pt has had recent liver surgery and hernia repair. Dr Annabell Howells wants ok to sched. This procedure. Please call [phone] ext [phone].

## 2011-05-31 NOTE — Telephone Encounter (Signed)
OK to schedule

## 2011-06-03 ENCOUNTER — Telehealth (INDEPENDENT_AMBULATORY_CARE_PROVIDER_SITE_OTHER): Payer: Self-pay

## 2011-06-03 NOTE — Telephone Encounter (Signed)
LM with surgery coordinator at Dr. Belva Crome office that it was okay to schedule lithotripsy for kidney stones for the patient.

## 2011-06-10 ENCOUNTER — Ambulatory Visit (INDEPENDENT_AMBULATORY_CARE_PROVIDER_SITE_OTHER): Payer: 59 | Admitting: General Surgery

## 2011-06-10 ENCOUNTER — Encounter (INDEPENDENT_AMBULATORY_CARE_PROVIDER_SITE_OTHER): Payer: Self-pay | Admitting: General Surgery

## 2011-06-10 ENCOUNTER — Encounter (INDEPENDENT_AMBULATORY_CARE_PROVIDER_SITE_OTHER): Payer: Self-pay

## 2011-06-10 DIAGNOSIS — K7689 Other specified diseases of liver: Secondary | ICD-10-CM

## 2011-06-10 NOTE — Progress Notes (Signed)
HISTORY: Pt feeling great after laparoscopic hepatic cyst unroofing times 2.  Her breathing is better.  Her RUQ pain is resolved.  She states that she only took 2 advil at home for pain post op.  She denies fever/ chills/nausea/vomiting or difficulty with bowel movements.  She is anxious to return to work.     PERTINENT REVIEW OF SYSTEMS: Otherwise negative.     EXAM: Head: Normocephalic and atraumatic.  Eyes:  Conjunctivae are normal. Pupils are equal, round, and reactive to light. No scleral icterus.  Neck:  Normal range of motion. Neck supple. No tracheal deviation present. No thyromegaly present.  Resp: No respiratory distress, normal effort. Abd:  Abdomen is soft, non distended and non tender. No masses are palpable.  There is no rebound and no guarding.  Neurological: Alert and oriented to person, place, and time. Coordination normal.  Skin: Skin is warm and dry. No rash noted. No diaphoretic. No erythema. No pallor.  Psychiatric: Normal mood and affect. Normal behavior. Judgment and thought content normal.     Pathology reviewed and demonstrates: 1. Cyst, excision, Right hepatic wall - BENIGN SIMPLE (BILIARY) TYPE CYST. - NEGATIVE FOR MALIGNANCY. 2. Cyst, excision, Left hepatic wall - BENIGN SIMPLE (BILIARY) TYPE CYST. - NEGATIVE FOR MALIGNANCY  ASSESSMENT AND PLAN:   HEPATIC CYST Pt doing great 2-3 weeks post operatively from laparoscopic liver cyst unroofing. No heavy lifting for another week. OK to go back to light water aerobics. Pt given return to work note.   Follow up PRN. Would not do repeat CT unless symptoms warrant.  Cysts can recur, but would not do any intervention unless pt was symptomatic.     Pt OK to get lithotripsy.      Maudry Diego, MD Surgical Oncology, General & Endocrine Surgery [facility]  Michele Mcalpine, MD, MD Michele Mcalpine, MD

## 2011-06-10 NOTE — Patient Instructions (Signed)
Call for questions or recurrent pain.

## 2011-06-10 NOTE — Assessment & Plan Note (Signed)
Pt doing great 2-3 weeks post operatively from laparoscopic liver cyst unroofing. No heavy lifting for another week. OK to go back to light water aerobics. Pt given return to work note.   Follow up PRN. Would not do repeat CT unless symptoms warrant.  Cysts can recur, but would not do any intervention unless pt was symptomatic.

## 2011-06-13 NOTE — Discharge Summary (Signed)
NAMEMarland Kitchen  Lisa Hamilton, Lisa Hamilton                  ACCOUNT NO.:  [id number]  MEDICAL RECORD NO.:  [id number]  LOCATION:  5151                         FACILITY:  [facility]  PHYSICIAN:  Almond Lint, MD       DATE OF BIRTH:  May 25, 1946  DATE OF ADMISSION:  05/21/2011 DATE OF DISCHARGE:  05/24/2011                              DISCHARGE SUMMARY   ADDENDUM:  DISCHARGE DIAGNOSIS:  Massive liver cyst.     Almond Lint, MD     FB/MEDQ  D:  06/13/2011  T:  06/13/2011  Job:  [id number]

## 2011-06-26 ENCOUNTER — Encounter (HOSPITAL_COMMUNITY): Payer: Self-pay | Admitting: Pharmacy Technician

## 2011-06-28 ENCOUNTER — Encounter (HOSPITAL_COMMUNITY): Payer: Self-pay | Admitting: *Deleted

## 2011-06-28 NOTE — Progress Notes (Signed)
Pt instructed no aspirin, ibuprofen, toradol and OTC supplements 3 days prior to lithotripsy procedure. Instructed pt not to take triameterene-hydrochlorathiazide morning of procedure. Pt instructed to take po dulcolax as per office instructions.

## 2011-07-03 NOTE — H&P (Signed)
Active Problems Problems  1. Hepatic Cyst 573.8 2. Lumbago 724.2 3. Lumbosacral Disc Degeneration 722.52 4. Microscopic Hematuria 599.72 5. Nephrolithiasis Of The Right Kidney 592.0 6. Pyuria 791.9  History of Present Illness  Lisa Hamilton returns today in f/u.  She had a marsupialization of 3 liver cysts and an umbilical hernia repair by Dr. Donell Beers on 10/16 and she is doing well with much less right sided discomfort.  A KUB today shows the right renal pelvic stone which is more easily visualized than before.  She denies hematuria.   Past Medical History Problems  1. History of  Arthritis V13.4 2. History of  Asthma 493.90 3. History of  Esophageal Reflux 530.81 4. History of  Glaucoma 365.9 5. History of  Hypercholesterolemia 272.0 6. History of  Nephrolithiasis V13.01 7. History of  Seizure 8. History of  Skin Cancer V10.83  Surgical History Problems  1. History of  Cystoscopy With Ureteroscopy Right 2. History of  Eye Surgery 3. History of  Hernia Repair 4. History of  Hernia Repair 5. History of  Liver Surgery 6. History of  Neuroplasty Median Nerve At Carpal Tunnel 7. History of  Neuroplasty Median Nerve At Carpal Tunnel 8. History of  Wrist Incision 9. History of  Wrist Incision  Current Meds 1. Advair Diskus 500-50 MCG/DOSE Inhalation Aerosol Powder Breath Activated; Therapy:  (Recorded:26Sep2012) to 2. Albuterol Sulfate (2.5 MG/3ML) 0.083% Inhalation Nebulization Solution; Therapy:  (Recorded:26Sep2012) to 3. Albuterol Sulfate 4 MG Oral Tablet; Therapy: (Recorded:26Sep2012) to 4. Albuterol Sulfate NEBU; Therapy: (Recorded:26Sep2012) to 5. Potassium Chloride CR 10 MEQ Oral Tablet Extended Release; Therapy: (Recorded:26Sep2012)  to 6. ProAir HFA 108 (90 Base) MCG/ACT Inhalation Aerosol Solution; Therapy: 24Jul2012 to 7. Ranitidine HCl 150 MG Oral Tablet; Therapy: (Recorded:26Sep2012) to 8. Simvastatin 10 MG Oral Tablet; Therapy: (Recorded:26Sep2012) to 9. Singulair 10  MG Oral Tablet; Therapy: (Recorded:26Sep2012) to 10. Theophylline 300 MG TABS; Therapy: (Recorded:26Sep2012) to 11. Triamterene-HCTZ 37.5-25 MG Oral Tablet; Therapy: (Recorded:26Sep2012) to  Allergies Medication  1. Augmentin TABS  Family History Problems  1. Family history of  Death In The Family Father died age 49-lung cancer 2. Family history of  Death In The Family Mother Died age 2 Pneumonia 3. Family history of  Family Health Status Number Of Children 2 sons 4. Paternal history of  Heart Disease V17.49 5. Sororal history of  Nephrolithiasis  Social History Problems  1. Caffeine Use 1 per day 2. Marital History - Currently Married 3. Never A Smoker 4. Occupation: Educational psychologist  5. History of  Alcohol Use 6. History of  Tobacco Use 305.1  Review of Systems  Cardiovascular: no chest pain.  Respiratory: no shortness of breath.    Vitals Vital Signs [Data Includes: Last 1 Day]  24Oct2012 01:22PM  Blood Pressure: 119 / 79 Temperature: 98.8 F Heart Rate: 77  Physical Exam Constitutional: Well nourished and well developed . No acute distress.  Pulmonary: No respiratory distress and normal respiratory rhythm and effort.  Cardiovascular: Heart rate and rhythm are normal . No peripheral edema.  Abdomen: The abdomen is obese. The abdomen is soft and nontender.    Results/Data Urine [Data Includes: Last 1 Day]  24Oct2012  COLOR: YELLOW  Reference Range YELLOW APPEARANCE: CLEAR  Reference Range CLEAR SPECIFIC GRAVITY: 1.020  Reference Range 1.005-1.030 pH: 7.5  Reference Range 5.0-8.0 GLUCOSE: NEG mg/dL Reference Range NEG BILIRUBIN: NEG  Reference Range NEG KETONE: NEG mg/dL Reference Range NEG BLOOD: SMALL  Abnormal Reference Range NEG PROTEIN: NEG mg/dL Reference  Range NEG UROBILINOGEN: 0.2 mg/dL Reference Range 1.6-1.0 NITRITE: NEG  Reference Range NEG LEUKOCYTE ESTERASE: NEG  Reference Range NEG SQUAMOUS EPITHELIAL/HPF: RARE  Reference Range  RARE WBC: 4-6 WBC/hpf Abnormal Reference Range <4 RBC: 4-6 RBC/hpf Abnormal Reference Range <4 BACTERIA: RARE  Reference Range RARE CRYSTALS: NONE SEEN  Reference Range NEG CASTS: NONE SEEN  Reference Range NEG  The following images/tracing/specimen were independently visualized:  KUB today shows a 6x46mm right renal pelvic stone. She has marked lumbar arthritis with scoliosis.    Assessment Assessed  1. Nephrolithiasis Of The Right Kidney 592.0   She has a persistant right renal pelvic stone but no symptoms.   Plan Health Maintenance (V70.0)  1. UA With REFLEX  Done: 24Oct2012 12:50PM Nephrolithiasis Of The Right Kidney (592.0)  2. Follow-up Schedule Surgery Office  Follow-up  Requested for: 24Oct2012   I am going to set her up for right ESWL and have reviewed the risks of bleeding, infection, renal and other organ injury, obstructing fragments requiring secondary procedures, thrombotic events and sedation risks among others.  I also discuss the outcome expectation and alternative therapies. I will have her cleared by Dr. Donell Beers.

## 2011-07-04 ENCOUNTER — Ambulatory Visit (HOSPITAL_COMMUNITY)
Admission: RE | Admit: 2011-07-04 | Discharge: 2011-07-04 | Disposition: A | Discharge status: Home / Self Care | Payer: 59 | Source: Ambulatory Visit | Attending: Urology | Admitting: Urology

## 2011-07-04 ENCOUNTER — Encounter (HOSPITAL_COMMUNITY): Payer: Self-pay

## 2011-07-04 ENCOUNTER — Encounter (HOSPITAL_COMMUNITY)
Admission: RE | Disposition: A | Discharge status: Home / Self Care | Payer: Self-pay | Source: Ambulatory Visit | Attending: Urology

## 2011-07-04 ENCOUNTER — Ambulatory Visit (HOSPITAL_COMMUNITY): Payer: 59

## 2011-07-04 DIAGNOSIS — J4489 Other specified chronic obstructive pulmonary disease: Secondary | ICD-10-CM | POA: Insufficient documentation

## 2011-07-04 DIAGNOSIS — Q615 Medullary cystic kidney: Secondary | ICD-10-CM | POA: Insufficient documentation

## 2011-07-04 DIAGNOSIS — J449 Chronic obstructive pulmonary disease, unspecified: Secondary | ICD-10-CM | POA: Insufficient documentation

## 2011-07-04 DIAGNOSIS — N200 Calculus of kidney: Secondary | ICD-10-CM | POA: Insufficient documentation

## 2011-07-04 DIAGNOSIS — G40909 Epilepsy, unspecified, not intractable, without status epilepticus: Secondary | ICD-10-CM | POA: Insufficient documentation

## 2011-07-04 SURGERY — LITHOTRIPSY, ESWL
Anesthesia: LOCAL | Laterality: Right

## 2011-07-04 MED ORDER — DIPHENHYDRAMINE HCL 25 MG PO CAPS
25.0000 mg | ORAL_CAPSULE | ORAL | Status: AC
  Administered 2011-07-04: 25 mg via ORAL

## 2011-07-04 MED ORDER — DIAZEPAM 5 MG PO TABS
10.0000 mg | ORAL_TABLET | ORAL | Status: AC
  Administered 2011-07-04: 10 mg via ORAL

## 2011-07-04 MED ORDER — CIPROFLOXACIN HCL 500 MG PO TABS
500.0000 mg | ORAL_TABLET | ORAL | Status: AC
  Administered 2011-07-04: 500 mg via ORAL

## 2011-07-04 MED ORDER — DEXTROSE-NACL 5-0.45 % IV SOLN
INTRAVENOUS | Status: DC
  Administered 2011-07-04: 07:00:00 via INTRAVENOUS

## 2011-07-04 NOTE — Progress Notes (Signed)
To litho truck via w/c 

## 2011-07-04 NOTE — Progress Notes (Signed)
Denies Aspirin, advil or toradol x last 72 hours. Laxative done as per order 07-03-11

## 2011-07-04 NOTE — Op Note (Signed)
[facility] OP note scanned in chart.

## 2011-07-04 NOTE — Interval H&P Note (Signed)
History and Physical Interval Note:  07/04/2011 7:38 AM  Lisa Hamilton  has presented today for surgery, with the diagnosis of Renal Stone  The various methods of treatment have been discussed with the patient and family. After consideration of risks, benefits and other options for treatment, the patient has consented to  Procedure(s): EXTRACORPOREAL SHOCK WAVE LITHOTRIPSY (ESWL) as a surgical intervention .  The patients' history has been reviewed, patient examined, no change in status, stable for surgery.  I have reviewed the patients' chart and labs.  Questions were answered to the patient's satisfaction.     Homar Weinkauf J

## 2011-07-08 ENCOUNTER — Encounter (HOSPITAL_COMMUNITY): Payer: Self-pay

## 2011-11-27 ENCOUNTER — Other Ambulatory Visit: Payer: Self-pay | Admitting: Dermatology

## 2012-03-18 ENCOUNTER — Other Ambulatory Visit: Payer: Self-pay | Admitting: *Deleted

## 2012-03-18 MED ORDER — ALBUTEROL SULFATE (2.5 MG/3ML) 0.083% IN NEBU: 2.5000 mg | INHALATION_SOLUTION | Freq: Two times a day (BID) | RESPIRATORY_TRACT | Status: DC

## 2012-03-18 MED ORDER — FLUTICASONE-SALMETEROL 500-50 MCG/DOSE IN AEPB: 1.0000 | INHALATION_SPRAY | Freq: Two times a day (BID) | RESPIRATORY_TRACT | Status: DC

## 2012-03-18 NOTE — Telephone Encounter (Signed)
Previous Refill done in error-- needed PRINT Rx done.

## 2012-03-24 ENCOUNTER — Other Ambulatory Visit: Payer: Self-pay | Admitting: Pulmonary Disease

## 2012-03-24 MED ORDER — THEOPHYLLINE ER 300 MG PO CP24: 300.0000 mg | ORAL_CAPSULE | Freq: Every day | ORAL | Status: DC

## 2012-03-24 MED ORDER — POTASSIUM CHLORIDE CRYS ER 20 MEQ PO TBCR: 20.0000 meq | EXTENDED_RELEASE_TABLET | Freq: Every day | ORAL | Status: DC

## 2012-03-24 MED ORDER — TRIAMTERENE-HCTZ 37.5-25 MG PO CAPS: 1.0000 | ORAL_CAPSULE | ORAL | Status: DC

## 2012-03-24 MED ORDER — ALBUTEROL SULFATE ER 4 MG PO TB12: 4.0000 mg | ORAL_TABLET | Freq: Two times a day (BID) | ORAL | Status: DC

## 2012-03-24 MED ORDER — MONTELUKAST SODIUM 10 MG PO TABS: 10.0000 mg | ORAL_TABLET | Freq: Every day | ORAL | Status: DC

## 2012-04-30 ENCOUNTER — Other Ambulatory Visit: Payer: Self-pay | Admitting: Gynecology

## 2012-05-04 ENCOUNTER — Telehealth: Payer: Self-pay | Admitting: Pulmonary Disease

## 2012-05-04 NOTE — Telephone Encounter (Signed)
Pt is scheduled for CPX 05/15/12. Please advise SN thanks

## 2012-05-04 NOTE — Telephone Encounter (Signed)
rx has been faxed to the pt to have these labs done by [facility].  Please have these results faxed to SN once completed.  i have called and spoke with pt and she is aware.

## 2012-05-15 ENCOUNTER — Encounter: Payer: Self-pay | Admitting: Pulmonary Disease

## 2012-05-15 ENCOUNTER — Ambulatory Visit (INDEPENDENT_AMBULATORY_CARE_PROVIDER_SITE_OTHER)
Admission: RE | Admit: 2012-05-15 | Discharge: 2012-05-15 | Disposition: A | Discharge status: Home / Self Care | Payer: 59 | Source: Ambulatory Visit | Attending: Pulmonary Disease | Admitting: Pulmonary Disease

## 2012-05-15 ENCOUNTER — Ambulatory Visit (INDEPENDENT_AMBULATORY_CARE_PROVIDER_SITE_OTHER): Payer: 59 | Admitting: Pulmonary Disease

## 2012-05-15 VITALS — BP 142/76 | HR 90 | Temp 99.3°F | Ht 61.0 in | Wt 226.6 lb

## 2012-05-15 DIAGNOSIS — Z0000 Encounter for general adult medical examination without abnormal findings: Secondary | ICD-10-CM

## 2012-05-15 DIAGNOSIS — J4489 Other specified chronic obstructive pulmonary disease: Secondary | ICD-10-CM

## 2012-05-15 DIAGNOSIS — E669 Obesity, unspecified: Secondary | ICD-10-CM

## 2012-05-15 DIAGNOSIS — Q615 Medullary cystic kidney: Secondary | ICD-10-CM

## 2012-05-15 DIAGNOSIS — J449 Chronic obstructive pulmonary disease, unspecified: Secondary | ICD-10-CM

## 2012-05-15 DIAGNOSIS — E559 Vitamin D deficiency, unspecified: Secondary | ICD-10-CM

## 2012-05-15 DIAGNOSIS — I872 Venous insufficiency (chronic) (peripheral): Secondary | ICD-10-CM

## 2012-05-15 DIAGNOSIS — E7800 Pure hypercholesterolemia, unspecified: Secondary | ICD-10-CM

## 2012-05-15 DIAGNOSIS — E042 Nontoxic multinodular goiter: Secondary | ICD-10-CM

## 2012-05-15 DIAGNOSIS — K449 Diaphragmatic hernia without obstruction or gangrene: Secondary | ICD-10-CM

## 2012-05-15 DIAGNOSIS — K5730 Diverticulosis of large intestine without perforation or abscess without bleeding: Secondary | ICD-10-CM

## 2012-05-15 DIAGNOSIS — D126 Benign neoplasm of colon, unspecified: Secondary | ICD-10-CM

## 2012-05-15 DIAGNOSIS — Z23 Encounter for immunization: Secondary | ICD-10-CM

## 2012-05-15 DIAGNOSIS — M1990 Unspecified osteoarthritis, unspecified site: Secondary | ICD-10-CM

## 2012-05-15 DIAGNOSIS — I10 Essential (primary) hypertension: Secondary | ICD-10-CM

## 2012-05-15 DIAGNOSIS — M810 Age-related osteoporosis without current pathological fracture: Secondary | ICD-10-CM

## 2012-05-15 MED ORDER — RANITIDINE HCL 150 MG PO TABS: 150.0000 mg | ORAL_TABLET | Freq: Every day | ORAL | Status: DC

## 2012-05-15 MED ORDER — POTASSIUM CHLORIDE CRYS ER 20 MEQ PO TBCR: 20.0000 meq | EXTENDED_RELEASE_TABLET | Freq: Every day | ORAL | Status: DC

## 2012-05-15 MED ORDER — THEOPHYLLINE ER 300 MG PO CP24: 300.0000 mg | ORAL_CAPSULE | Freq: Every day | ORAL | Status: DC

## 2012-05-15 MED ORDER — ALBUTEROL SULFATE HFA 108 (90 BASE) MCG/ACT IN AERS: 2.0000 | INHALATION_SPRAY | Freq: Four times a day (QID) | RESPIRATORY_TRACT | Status: DC | PRN

## 2012-05-15 MED ORDER — TRAMADOL HCL 50 MG PO TABS: 50.0000 mg | ORAL_TABLET | Freq: Four times a day (QID) | ORAL | Status: DC | PRN

## 2012-05-15 MED ORDER — ALBUTEROL SULFATE (2.5 MG/3ML) 0.083% IN NEBU: 2.5000 mg | INHALATION_SOLUTION | Freq: Two times a day (BID) | RESPIRATORY_TRACT | Status: DC

## 2012-05-15 MED ORDER — ALBUTEROL SULFATE ER 4 MG PO TB12: 4.0000 mg | ORAL_TABLET | Freq: Two times a day (BID) | ORAL | Status: DC

## 2012-05-15 MED ORDER — TRIAMTERENE-HCTZ 37.5-25 MG PO CAPS: 1.0000 | ORAL_CAPSULE | ORAL | Status: DC

## 2012-05-15 MED ORDER — FLUTICASONE-SALMETEROL 500-50 MCG/DOSE IN AEPB: 1.0000 | INHALATION_SPRAY | Freq: Two times a day (BID) | RESPIRATORY_TRACT | Status: DC

## 2012-05-15 MED ORDER — MONTELUKAST SODIUM 10 MG PO TABS: 10.0000 mg | ORAL_TABLET | Freq: Every day | ORAL | Status: DC

## 2012-05-15 MED ORDER — ROPINIROLE HCL 0.5 MG PO TABS: ORAL_TABLET | ORAL | Status: DC

## 2012-05-15 NOTE — Patient Instructions (Addendum)
Today we updated your med list in our EPIC system...    Continue your current medications the same...  We decided to add REQUIP 0.5mg  at bedtime for the restless legs...    If this dose is not maximally effective> then I rec incr to 2 tabs at bedtime...  Today we did your follow up CXR...    We can communicte the results via "my chart" patient portal...  Let's get on track w/ our diet & exercise program...    The goal is to lose 15-20 lbs...  Call for any questions.Marland KitchenMarland Kitchen

## 2012-05-15 NOTE — Progress Notes (Signed)
Subjective:    Patient ID: Lisa Hamilton, female    DOB: [birth year], 66 y.o.   MRN: [id number]  HPI 66 y/o WF here for a follow up visit... she has multiple medical problems as noted below...   ~  Dec09:  she works for [employer] gets all her labs done there... she has had a good year without asthma exas etc... she noted a sl rash on left side of her mid-back x several days- this looks like left T9 shingles & we will Rx accordingly w/ Acyclovir & Medrol Dosepak...  ~  August 31, 2009:  she went to the ER 08/26/09 w/ asthma exac ppt by URI w/ cough, congestion, wheezing, tightness, incr SOB.Marland Kitchen. temp to 101, cough w/ green sputum, CWP from coughing... in the ER>> CXR showed incr markings w/o infiltrate, CBC= OK w/ WBC 5,600 (92% seg), Chem showed K3.3 & BS186... given Avelox, Pred taper, Cough syrup- sl better now... still congested & needs MUCINEX Bid/ Fluids/ etc... she had cut down Advair to 250, and not using the Nebulizer...  ~  September 26, 2009:  improved on the above Rx, back on nebs & Advair500... BP controlled on meds... Chol is OK on Simva40... still needs to get on track w/ diet & exercise program... she donates blood Q8wks at [facility]- CBC looks good... she had Flu shot 10/10, and requests 90d Rxs...  ~  February 25, 2011:  78mo ROV & she reports doing well w/o recent resp exac> denies much cough, sputum, SOB, ch in DOE, edema, etc;  Taking meds regularly & wants 90d refill prescriptions (see below);  She brought labs from [facility]> FLP, CBC, Chems, TSH- all look good;  We checked CXR (elev right hemidiaph, basilar atx, otherw NAD) & EKG (NSR, wnl);  Weight is 224# (up 5#) & despite everything she appears unable to lose weight;  See prob list below>>  ~  May 15, 2012:  26mo ROV & CPX> [name] reports a rough year- she developed pain in her side & eval revealed kidney stones & large liver cysts- she had to have laparoscopic surg 10/12 by DrByerly to unroof the 2 cysts & drained 3L of fluid; then  she had Lithotripsy 11/12 by DrWrenn...    COPD/ AB> on Theo300Bid, Albut4mg Bid, Advair500Bid, Singulair10/d, NEBS w/ Albut; she notes breathing OK, takes meds regularly, no interval exacerbations...    HBP> on Dyazide daily, K20/d> BP= 142/76 & even better on home checks...    CHOL> prev on Simva40 but she stopped on her own due to the liver cysts (?) and never restarted; f/u FLP 10/13 showed TChol 193, TG 121, HDL 44, LDL 125; she would like to stay off meds & work on diet & weight reduction...    Obesity> weight= 227#, BMI~43, & we reviewed diet, exercise, wt reduction strategies...    GI> HH, Divertics, Polyps> on Zantac, & up to date on screening colonoscopy...    Liver Cyst, s/p laparoscopic surg> as above...    Medullary Sponge Kid & stones> as above...    DJD, Osteoporosis> on Tramadol, calcium, MVI, VitD; GYN stopped her Alendronate for a drug holiday...    RLS> she notes incr restless leg symptoms- try Requip 0.5mg  1-2 Qhs... We reviewed prob list, meds, xrays and labs> see below for updates >> she will be 66 next month & wants to get her PNEUMOVAX now- ok; she declines Flu vaccine... LABS 10/13 at [facility]: FLP- ok x LDL=125;  Chems- wnl x BS=106;  CBC-  wnl;  TSH=1.55;  VitD=43    Problem List:   PHYSICAL EXAMINATION (ICD-V70.0) - see attached pt summary note of her meds, physicians, etc... GYN= DrBowie & she is up-to-date... She gets the yearly Flu vaccine... had Pneumovax 9/02 (& repeat due after age 66)... had Tetanus shot  6/01>> Tdap given today 09/26/09...  GLAUCOMA (ICD-365.9) - s/p glaucoma sury by [facility] in [facility] & followed by DrShapiro w/ surg yrs ago (no drops required & she sees Ophthal yearly). ~  10/13:  She reports that Glaucoma is well controlled, but she has early cataracts discovered...  COPD (ICD-496) & ASTHMATIC BRONCHITIS, ACUTE (ICD-466.0) - on THEO 300mg Bid,  ALBUTEROL 4mg Bid,  ADVAIR 500/50 Bid,  SINGULAIR 10mg /d,  NEBS w/ Albut Prn... her breathing has been  doing well (until the 1/11 exacerbation- see above)... ~  baseline CXR= sl incr markings, NAD... ~  prev CT Chest 4/06 showed mild basilar atx, +multinod goiter, liver cysts, left renal calc, spurs, etc. ~  PFT's 3/08 showed FVC= 1.95 (71%), FEV1= 1.13 (56%), %1sec= 58, mid-flows= 20%pred, +mild air trapping & DLCO/VA= normal... ~  CXR 1/11 showed incr markings, sl elev right hemidiaph, NAD.Marland Kitchen. ~  CXR 7/12 showed elev right hemidiaph, basilar atx, otherw NAD.Marland Kitchen. ~  CXR 10/13 showed stable mild cardiomeg, ectasia of thorAo, elev right hemidiaph, min basilar atx, degen changes in spine...  HYPERTENSION (ICD-401.9) - on ASA 81mg /d,  DYAZIDE daily + KCl daily... BP= 142/76 today & better than this at home checks... denies HA, fatigue, visual changes, CP, palipit, dizziness, syncope, dyspnea, edema, etc...  ~  cardiac eval 2006 by DrWall- neg w/ 2DEcho showing mild MR, sm effusion, norm LV w/ EF=55%.  VENOUS INSUFFICIENCY (ICD-459.81) - she knows to avoid sodium, elevate legs, wear support hose.  HYPERCHOLESTEROLEMIA (ICD - prev on Simva40,  Fish Oil 2/d,  CoQ10 daily; she stopped Simva40 on her own 10/12 when she had liver cyst surg. ~  FLP 2/07 off meds showed TChol 246, TG 151, HDL 56, LDL 160... rec- restart Lip10. ~  FLP 7/08 on Lip10 showed TChol 156, TG 96, HDL 56, LDL 81... continue same. ~  FLP 12/09 on Lip10 showed TChol 169, TG 95, HDL 59, LDL 91... continue same. ~  Insurance Co 11/10 rec change to SIMVASTATIN 40mg /d... ~  FLP 2/11 on Simva40 showed TChol 191, TG 138, HDL 65, LDL 98... continue same. ~  FLP 7/12 on Simva40 showed TChol 179, TG 133, HDL 64, LDL 88 ~  FLP 10/13 on diet alone showed TChol 193, TG 121, HDL 44, LDL 125... She wants to continue diet rx but MUST lose wt.  NONTOXIC MULTINODULAR GOITER (ICD-241.1) - CT Chest 4/06 showed several thyroid nodules- largest= 1cm... clinically and biochem euthyroid... ~  labs 3/08 showed TSH= 0.50 ~  labs 12/09 showed TSH=  1.08 ~  labs 2/11 showed TSH= 1.11 ~  Labs 7/12 showed TSH= 0.91 ~  Labs 10/13 showed TSH= 1.55  OBESITY (ICD-278.00) - range 200 - 220 over 10+ yrs. ~  weight 12/09 = 219#, up 5# in the last year... ~  weight 1/11 = 211# ~  Weight 7/12 = 224#... She needs help, suggest wt watchers, etc... ~  Weight 10/13 = 227#  HIATAL HERNIA (ICD-553.3) - on RANITADINE 150mg  Prn... he denies N/ V/ abd pain, etc...  DIVERTICULOSIS OF COLON (ICD-562.10) & COLONIC POLYPS (ICD-211.3) -  ~  colonoscopy 11/05 by DrPatterson showed divertics & 4mm polyp= hyperplastic... f/u planned 34yrs. ~  colonoscopy 12/10  showed 1 cecal polyp= tubular adenoma, f/u planned 46yrs.  Hx of HEPATIC CYST (ICD-573.8) - AbdSonar 4/06 showed mult large liver cysts, all simple cysts, & largest on dome of right lobe measures 11 cm... ~  10/12:  Eval revealed that the cysts had markedly enlarged & she underwent laparoscopic surg by DrByerly to unroof the cysts 7 drained 3L of fluid...  Hx of MEDULLARY SPONGE KIDNEY (ICD-753.17) & RENAL CALCULUS, HX OF (ICD-V13.01) - followed by DrWrenn in past... she is asymptomatic & renal function normal... ~  labs 2/11 showed BUN= 18, Creat= 0.66 ~  Labs 7/12 showed abn UA, BUN= 12, Creat= 0.58 ~  11/12: she had Lithotripsy by DrWrenn for ureteral stone... ~  Labs 10/13 showed BUN= 16, Creat= 0.65  DEGENERATIVE JOINT DISEASE (ICD-715.90) - on TRAMADOL 50mg  Prn for pain... she sees DrSypher & DrAplington in the past- s/p bilat wrist fx: right 1996, left 2001... s/p bilat CTS surg: left 1999, right 2008... ~  10/13:  She is also c/o bilat shoulder pain...  OSTEOPOROSIS (ICD-733.00) - prev on Alendronate 70mg /wk per DrBowie- he stopped it in 2012 for drug holiday per pt hx... plus Calcium, MVI and Vit D 1000u daily... ~  BMD 3/08 at [facility] showed TScores -2.8 in Spine, -1.5 in left [facility]... ~  f/u BMDs per Gyn DrBowie...  VITAMIN D DEFICIENCY (ICD-268.9) - as above.  Hx of RESTLESS LEG SYNDROME  (ICD-333.94) >> we decided to try REQUIP 0.5mg  1-2 Qhs for her RLS...  LIPOMA (ICD-214.9)  SHINGLES (ICD-053.9) - 12/09 involving left T9 distrib.  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   Past Surgical History  Procedure Date  . Tonsillectomy 1967  . Glaucoma surgery 1977  . Bilateral wrist fracture, right 1996    left 2001, Dr. Teressa Senter  . Bilateral carpel tunnel repairs     Dr. Simonne Come  left--1999   right--2008  . Squamous cell carcinoma excision 2010  . Laparoscopic liver cyst fenestration   . Hernia repair 1963  . Hernia repair 2012    umb hernia    Outpatient Encounter Prescriptions as of 05/15/2012  Medication Sig Dispense Refill  . albuterol (PROVENTIL HFA;VENTOLIN HFA) 108 (90 BASE) MCG/ACT inhaler Inhale 2 puffs into the lungs every 6 (six) hours as needed. Wheezing        . albuterol (PROVENTIL) (2.5 MG/3ML) 0.083% nebulizer solution Take 3 mLs (2.5 mg total) by nebulization 2 (two) times daily.  360 mL  12  . albuterol (VOSPIRE ER) 4 MG 12 hr tablet Take 1 tablet (4 mg total) by mouth every 12 (twelve) hours.  180 tablet  3  . aspirin 81 MG tablet Take 2 tablets by mouth at bedtime      . Calcium Carbonate-Vitamin D (CALCIUM-VITAMIN D) 500-200 MG-UNIT per tablet Take 2 tablets by mouth daily.        . Cholecalciferol (VITAMIN D) 1000 UNITS capsule Take 1,000 Units by mouth daily.        Marland Kitchen Co-Enzyme Q-10 100 MG CAPS Take 200 mg by mouth daily.        . Fluticasone-Salmeterol (ADVAIR DISKUS) 500-50 MCG/DOSE AEPB Inhale 1 puff into the lungs every 12 (twelve) hours.  3 each  3  . ibuprofen (ADVIL,MOTRIN) 200 MG tablet Take 400 mg by mouth every 6 (six) hours as needed. Pain        . mometasone (NASONEX) 50  MCG/ACT nasal spray Place 2 sprays into the nose 2 (two) times daily.        . montelukast (SINGULAIR) 10 MG tablet Take 1 tablet (10 mg total) by mouth at bedtime.  90 tablet  3    . Multiple Vitamin (MULTIVITAMIN) capsule Take 1 capsule by mouth daily.        . Omega-3 Fatty Acids (FISH OIL) 1200 MG CAPS Take 2 capsules by mouth daily.        . potassium chloride SA (K-DUR,KLOR-CON) 20 MEQ tablet Take 1 tablet (20 mEq total) by mouth daily.  90 tablet  3  . ranitidine (ZANTAC) 150 MG tablet Take 1 tablet (150 mg total) by mouth at bedtime.  90 tablet  3  . theophylline (THEO-24) 300 MG 24 hr capsule Take 1 capsule (300 mg total) by mouth daily.  90 capsule  3  . traMADol (ULTRAM) 50 MG tablet Take 50 mg by mouth every 6 (six) hours as needed. Pain         . triamterene-hydrochlorothiazide (DYAZIDE) 37.5-25 MG per capsule Take 1 each (1 capsule total) by mouth every morning.  90 capsule  3  . DISCONTD: simvastatin (ZOCOR) 40 MG tablet Take 1 tablet (40 mg total) by mouth at bedtime.  90 tablet  3    Allergies  Allergen Reactions  . Adhesive (Tape) Other (See Comments)    blisters  . Amoxicillin-Pot Clavulanate     REACTION: rash  . Codeine     REACTION: nightmares    Current Medications, Allergies, Past Medical History, Past Surgical History, Family History, and Social History were reviewed in [employer] record.   Review of Systems        The patient complains of nasal congestion, dyspnea on exertion, change in bowel habits, indigestion/heartburn, joint pain, stiffness, and anxiety.  The patient denies fever, chills, sweats, anorexia, fatigue, weakness, malaise, weight loss, sleep disorder, blurring, diplopia, eye irritation, eye discharge, vision loss, eye pain, photophobia, earache, ear discharge, tinnitus, decreased hearing, nosebleeds, sore throat, hoarseness, chest pain, palpitations, syncope, orthopnea, PND, peripheral edema, cough, dyspnea at rest, excessive sputum, hemoptysis, wheezing, pleurisy, nausea, vomiting, diarrhea, constipation, abdominal pain, melena, hematochezia, jaundice, gas/bloating, dysphagia, odynophagia, dysuria,  hematuria, urinary frequency, urinary hesitancy, nocturia, incontinence, back pain, joint swelling, muscle cramps, muscle weakness, arthritis, sciatica, restless legs, leg pain at night, leg pain with exertion, rash, itching, dryness, suspicious lesions, paralysis, paresthesias, seizures, tremors, vertigo, transient blindness, frequent falls, frequent headaches, difficulty walking, depression, memory loss, confusion, cold intolerance, heat intolerance, polydipsia, polyphagia, polyuria, unusual weight change, abnormal bruising, bleeding, enlarged lymph nodes, urticaria, allergic rash, hay fever, and recurrent infections.     Objective:   Physical Exam      WD, overweight, 66 y/o WF in NAD... GENERAL:  Alert & oriented; pleasant & cooperative... HEENT:  [place]/AT, EOM-wnl, PERRLA, EACs-clear, TMs-wnl, NOSE-clear, THROAT-clear & wnl. NECK:  Supple w/ fairROM; no JVD; normal carotid impulses w/o bruits; no thyromegaly or nodules palpated; no lymphadenopathy. CHEST:  Clear to P & A; without wheezes/ rales/ or rhonchi heard... HEART:  Regular Rhythm; without murmurs/ rubs/ or gallops detedted... ABDOMEN:  Soft & nontender; normal bowel sounds; no organomegaly or masses palpated... EXT: without deformities, mild arthritic changes; no varicose veins/ +venous insuffic/ no edema. NEURO:  CN's intact; motor testing normal; sensory testing normal; gait normal & balance OK. DERM:  mild shingles rash on back- left T9 distrib...  RADIOLOGY DATA:  Reviewed in the EPIC EMR & discussed  w/ the patient...  LABORATORY DATA:  Reviewed in the EPIC EMR & discussed w/ the patient...   Assessment & Plan:    COPD/ AB>  She has severe disease but very stable on her regular med regimen;  Hasn't required acute Rx or Pred in >53yr & doing satis, continue same...  HBP>  Controlled on meds;  Continue same, needs to lose weight...  Ven Insuffic>  Stable, continue low sodium, elevation, support hose & diuretic Rx...  CHOL>   Stable on Simva40, needs better diet & wt reduction...  OBESITY>  She hasn't been successful w/ wt reduction program> rec wt watchers etc...  THYROID>  Non toxic multinod thyroid ==> stable & euthyroid...  GI> HH, Divertics, Polyps, Hep cyst>  Stable & up to date on screening; s/p hep cyst surg 10/12...  GU> medullary sponge kidney, hx stones>  Stable w/ norm renal function; followed by drWrenn & s/p lithotripsy 11/12...  DJD>  Needs to get wt down, try Tramadol...  Osteoporosis>  BMDs monitored by GYN, DrBowie who has her off the Alendronate for now (drug holiday)...  Other medical issues as noted...   Patient's Medications  New Prescriptions   ROPINIROLE (REQUIP) 0.5 MG TABLET    Take 1 tabet by mouth daily x 2 weeks then increase to 2 tablets by mouth at bedtime thereafter.  Previous Medications   ASPIRIN 81 MG TABLET    Take 2 tablets by mouth at bedtime   CALCIUM CARBONATE-VITAMIN D (CALCIUM-VITAMIN D) 500-200 MG-UNIT PER TABLET    Take 2 tablets by mouth daily.     CHOLECALCIFEROL (VITAMIN D) 1000 UNITS CAPSULE    Take 1,000 Units by mouth daily.     CO-ENZYME Q-10 100 MG CAPS    Take 200 mg by mouth daily.     IBUPROFEN (ADVIL,MOTRIN) 200 MG TABLET    Take 400 mg by mouth every 6 (six) hours as needed. Pain     MOMETASONE (NASONEX) 50 MCG/ACT NASAL SPRAY    Place 2 sprays into the nose 2 (two) times daily.     MULTIPLE VITAMIN (MULTIVITAMIN) CAPSULE    Take 1 capsule by mouth daily.     OMEGA-3 FATTY ACIDS (FISH OIL) 1200 MG CAPS    Take 2 capsules by mouth daily.    Modified Medications   Modified Medication Previous Medication   ALBUTEROL (PROVENTIL HFA;VENTOLIN HFA) 108 (90 BASE) MCG/ACT INHALER albuterol (PROVENTIL HFA;VENTOLIN HFA) 108 (90 BASE) MCG/ACT inhaler      Inhale 2 puffs into the lungs every 6 (six) hours as needed. Wheezing    Inhale 2 puffs into the lungs every 6 (six) hours as needed. Wheezing     ALBUTEROL (PROVENTIL) (2.5 MG/3ML) 0.083% NEBULIZER SOLUTION  albuterol (PROVENTIL) (2.5 MG/3ML) 0.083% nebulizer solution      Take 3 mLs (2.5 mg total) by nebulization 2 (two) times daily.    Take 3 mLs (2.5 mg total) by nebulization 2 (two) times daily.   ALBUTEROL (VOSPIRE ER) 4 MG 12 HR TABLET albuterol (VOSPIRE ER) 4 MG 12 hr tablet      Take 1 tablet (4 mg total) by mouth every 12 (twelve) hours.    Take 1 tablet (4 mg total) by mouth every 12 (twelve) hours.   FLUTICASONE-SALMETEROL (ADVAIR DISKUS) 500-50 MCG/DOSE AEPB Fluticasone-Salmeterol (ADVAIR DISKUS) 500-50 MCG/DOSE AEPB      Inhale 1 puff into the lungs every 12 (twelve) hours.    Inhale 1 puff into the lungs every 12 (twelve) hours.  MONTELUKAST (SINGULAIR) 10 MG TABLET montelukast (SINGULAIR) 10 MG tablet      Take 1 tablet (10 mg total) by mouth at bedtime.    Take 1 tablet (10 mg total) by mouth at bedtime.   POTASSIUM CHLORIDE SA (K-DUR,KLOR-CON) 20 MEQ TABLET potassium chloride SA (K-DUR,KLOR-CON) 20 MEQ tablet      Take 1 tablet (20 mEq total) by mouth daily.    Take 1 tablet (20 mEq total) by mouth daily.   RANITIDINE (ZANTAC) 150 MG TABLET ranitidine (ZANTAC) 150 MG tablet      Take 1 tablet (150 mg total) by mouth at bedtime.    Take 1 tablet (150 mg total) by mouth at bedtime.   THEOPHYLLINE (THEO-24) 300 MG 24 HR CAPSULE theophylline (THEO-24) 300 MG 24 hr capsule      Take 1 capsule (300 mg total) by mouth daily.    Take 1 capsule (300 mg total) by mouth daily.   TRAMADOL (ULTRAM) 50 MG TABLET traMADol (ULTRAM) 50 MG tablet      Take 1 tablet (50 mg total) by mouth every 6 (six) hours as needed. Pain    Take 50 mg by mouth every 6 (six) hours as needed. Pain      TRIAMTERENE-HYDROCHLOROTHIAZIDE (DYAZIDE) 37.5-25 MG PER CAPSULE triamterene-hydrochlorothiazide (DYAZIDE) 37.5-25 MG per capsule      Take 1 each (1 capsule total) by mouth every morning.    Take 1 each (1 capsule total) by mouth every morning.  Discontinued Medications   SIMVASTATIN (ZOCOR) 40 MG TABLET    Take 1  tablet (40 mg total) by mouth at bedtime.

## 2012-05-17 ENCOUNTER — Encounter: Payer: Self-pay | Admitting: Pulmonary Disease

## 2012-05-21 ENCOUNTER — Telehealth: Payer: Self-pay | Admitting: Pulmonary Disease

## 2012-05-21 NOTE — Telephone Encounter (Signed)
I spoke with pt and she stated she would like to get the shingles vaccine RX mailed to her home. She has not had this vaccine yet. Please advise SN thanks

## 2012-05-21 NOTE — Telephone Encounter (Signed)
Called and spoke with pt and she is aware that rx for the shingles vaccine has been placed in the mail to her. Nothing further is needed.

## 2012-05-22 ENCOUNTER — Encounter: Payer: Self-pay | Admitting: Pulmonary Disease

## 2012-06-03 ENCOUNTER — Telehealth: Payer: Self-pay | Admitting: Pulmonary Disease

## 2012-06-03 MED ORDER — THEOPHYLLINE ER 300 MG PO TB12: 300.0000 mg | ORAL_TABLET | Freq: Two times a day (BID) | ORAL | Status: DC

## 2012-06-03 NOTE — Telephone Encounter (Signed)
rx has been updated and faxed to [facility].  Called and lmom to make the pt aware.

## 2012-06-03 NOTE — Telephone Encounter (Signed)
Called, spoke with pt.  She was seen on 05/15/12.  theophylline (THEO-24) 300 MG 24 hr capsule was given for qd.  Pt states she has always taken theophylline 300 mg bid.  States Dr. Kriste Basque did not mention changing this and I do not see anything mentioned in the note either.  Dr. Kriste Basque, pls advise on the strength and frequency of the theophylline that pt is supposed to be on.  Thank you.

## 2012-06-11 ENCOUNTER — Telehealth: Payer: Self-pay | Admitting: Pulmonary Disease

## 2012-06-11 MED ORDER — FUROSEMIDE 40 MG PO TABS: 40.0000 mg | ORAL_TABLET | Freq: Every day | ORAL | Status: DC

## 2012-06-11 NOTE — Telephone Encounter (Signed)
Spoke with patient, she states she has been having should and hip pain for a while in which Dr, Kriste Basque is aware of in which he reccommended she see Dr. Simonne Come.  Patient saw Dr. Simonne Come on 06/08/12 and at this time BP was 173/?? (over something per patient).  Patient also says she has been having some swelling in her legs that usually goes away by morning by for past week in the morning this has not been the case, legs remain swollen in A.M. Patient says she has not been sleeping well and has to get out of bed during the night to move to a recliner for better rest, but denies any increased SOB.  Patient is requesting Dr. Okey Dupre on this issue and/or increase in her diuretic--triaterene-hydrochlorothiazide 37.5-25mg  1 tablet every morning.  Requesting a month supply be sent to [facility] and also her 90day supply be sent into [facility].  Last OV: 05/15/12  Allergies  Allergen Reactions  . Adhesive (Tape) Other (See Comments)    blisters  . Amoxicillin-Pot Clavulanate     REACTION: rash  . Codeine     REACTION: nightmares

## 2012-06-11 NOTE — Telephone Encounter (Signed)
Per SN----if she is taking the dyazide daily then she may need a stronger diurectic----so change to lasix 40 mg  1 tablet every morning.  thanks

## 2012-06-11 NOTE — Telephone Encounter (Signed)
PT informed of Dr Kriste Basque instructions and rx for 30 day sent to [facility] and 90 day sent to [facility] per pt request.  Pt aware to stop Dyazide

## 2012-06-24 ENCOUNTER — Other Ambulatory Visit: Payer: Self-pay | Admitting: Orthopedic Surgery

## 2012-07-31 ENCOUNTER — Encounter (HOSPITAL_COMMUNITY): Payer: Self-pay | Admitting: Pharmacy Technician

## 2012-08-04 ENCOUNTER — Encounter (HOSPITAL_COMMUNITY): Payer: Self-pay

## 2012-08-04 ENCOUNTER — Encounter (HOSPITAL_COMMUNITY)
Admission: RE | Admit: 2012-08-04 | Discharge: 2012-08-04 | Disposition: A | Discharge status: Home / Self Care | Payer: 59 | Source: Ambulatory Visit | Attending: Orthopedic Surgery | Admitting: Orthopedic Surgery

## 2012-08-04 LAB — BASIC METABOLIC PANEL
BUN: 11 mg/dL (ref 6–23)
Calcium: 9.3 mg/dL (ref 8.4–10.5)
GFR calc non Af Amer: >90 mL/min (ref >90)
Glucose, Bld: 82 mg/dL (ref 70–99)
Sodium: 139 mEq/L (ref 135–145)

## 2012-08-04 LAB — CBC
Hemoglobin: 12.9 g/dL (ref 12.0–15.0)
MCH: 26.8 pg (ref 26.0–34.0)
MCHC: 31 g/dL (ref 30.0–36.0)

## 2012-08-04 LAB — SURGICAL PCR SCREEN: MRSA, PCR: NEGATIVE

## 2012-08-04 NOTE — Patient Instructions (Signed)
YOUR SURGERY IS SCHEDULED AT [facility]  ON:  Thursday  1/9  REPORT TO [place] [facility] AT:  8:00 AM      PHONE # FOR SHORT STAY IS [phone]  DO NOT EAT OR DRINK ANYTHING AFTER MIDNIGHT THE NIGHT BEFORE YOUR SURGERY.  YOU MAY BRUSH YOUR TEETH, RINSE OUT YOUR MOUTH--BUT NO WATER, NO FOOD, NO CHEWING GUM, NO MINTS, NO CANDIES, NO CHEWING TOBACCO.  PLEASE TAKE THE FOLLOWING MEDICATIONS THE AM OF YOUR SURGERY WITH A FEW SIPS OF WATER:  ALBUTEROL TABLET, THEOPHYLLINE, GABAPENTIN.   USE YOUR NEBULIZER, YOUR ADVAIR DISKUS, AND ALBUTEROL INHALER.  BRING YOUR ALBUTEROL INHALER TO TAKE TO OR.  IF YOU USE INHALERS--USE YOUR INHALERS THE AM OF YOUR SURGERY AND BRING INHALERS TO THE HOSPITAL -TAKE TO SURGERY.    IF YOU ARE DIABETIC:  DO NOT TAKE ANY DIABETIC MEDICATIONS THE AM OF YOUR SURGERY.  IF YOU TAKE INSULIN IN THE EVENINGS--PLEASE ONLY TAKE 1/2 NORMAL EVENING DOSE THE NIGHT BEFORE YOUR SURGERY.  NO INSULIN THE AM OF YOUR SURGERY.  IF YOU HAVE SLEEP APNEA AND USE CPAP OR BIPAP--PLEASE BRING THE MASK AND THE TUBING.  DO NOT BRING YOUR MACHINE.  DO NOT BRING VALUABLES, MONEY, CREDIT CARDS.  DO NOT WEAR JEWELRY, MAKE-UP, NAIL POLISH AND NO METAL PINS OR CLIPS IN YOUR HAIR. CONTACT LENS, DENTURES / PARTIALS, GLASSES SHOULD NOT BE WORN TO SURGERY AND IN MOST CASES-HEARING AIDS WILL NEED TO BE REMOVED.  BRING YOUR GLASSES CASE, ANY EQUIPMENT NEEDED FOR YOUR CONTACT LENS. FOR PATIENTS ADMITTED TO THE HOSPITAL--CHECK OUT TIME THE DAY OF DISCHARGE IS 11:00 AM.  ALL INPATIENT ROOMS ARE PRIVATE - WITH BATHROOM, TELEPHONE, TELEVISION AND WIFI INTERNET.  IF YOU ARE BEING DISCHARGED THE SAME DAY OF YOUR SURGERY--YOU CAN NOT DRIVE YOURSELF HOME--AND SHOULD NOT GO HOME ALONE BY TAXI OR BUS.  NO DRIVING OR OPERATING MACHINERY FOR 24 HOURS FOLLOWING ANESTHESIA / PAIN MEDICATIONS.  PLEASE MAKE ARRANGEMENTS FOR SOMEONE TO BE WITH YOU AT HOME THE FIRST 24 HOURS AFTER SURGERY. RESPONSIBLE DRIVER'S  NAME___________________________                                               PHONE #   _______________________                               PLEASE READ OVER ANY  FACT SHEETS THAT YOU WERE GIVEN: MRSA INFORMATION, BLOOD TRANSFUSION INFORMATION, INCENTIVE SPIROMETER INFORMATION. FAILURE TO FOLLOW THESE INSTRUCTIONS MAY RESULT IN THE CANCELLATION OF YOUR SURGERY.   PATIENT SIGNATURE_________________________________

## 2012-08-04 NOTE — Pre-Procedure Instructions (Signed)
PREOP CBC, BMET, EKG WERE DONE AT [facility] TODAY - AS PER ANESTHESIOLOGIST'S GUIDELINES. CXR REPORT IS IN EPIC FROM 05/15/12.

## 2012-08-04 NOTE — Pre-Procedure Instructions (Signed)
PT'S PREOP BMET REPORT FAXED TO DR. APLINGTON'S OFFICE WITH NOTE THAT PT'S POTASSIUM 3.0.

## 2012-08-07 NOTE — Pre-Procedure Instructions (Signed)
DR. Simonne Come CALLED-STATES HE WILL NOTIFY PT TO TAKE ADDITIONAL POTASSIUM - REPEAT POTASSIUM DAY OF SURGERY.

## 2012-08-13 ENCOUNTER — Observation Stay (HOSPITAL_COMMUNITY)
Admission: RE | Admit: 2012-08-13 | Discharge: 2012-08-14 | Disposition: A | Discharge status: Home / Self Care | Payer: 59 | Source: Ambulatory Visit | Attending: Orthopedic Surgery | Admitting: Orthopedic Surgery

## 2012-08-13 ENCOUNTER — Encounter (HOSPITAL_COMMUNITY)
Admission: RE | Disposition: A | Discharge status: Home / Self Care | Payer: Self-pay | Source: Ambulatory Visit | Attending: Orthopedic Surgery

## 2012-08-13 ENCOUNTER — Ambulatory Visit (HOSPITAL_COMMUNITY): Payer: 59 | Admitting: Anesthesiology

## 2012-08-13 ENCOUNTER — Encounter (HOSPITAL_COMMUNITY): Payer: Self-pay | Admitting: Anesthesiology

## 2012-08-13 ENCOUNTER — Encounter (HOSPITAL_COMMUNITY): Payer: Self-pay | Admitting: *Deleted

## 2012-08-13 DIAGNOSIS — M67919 Unspecified disorder of synovium and tendon, unspecified shoulder: Principal | ICD-10-CM | POA: Insufficient documentation

## 2012-08-13 DIAGNOSIS — Z01810 Encounter for preprocedural cardiovascular examination: Secondary | ICD-10-CM | POA: Insufficient documentation

## 2012-08-13 DIAGNOSIS — D1779 Benign lipomatous neoplasm of other sites: Secondary | ICD-10-CM | POA: Insufficient documentation

## 2012-08-13 DIAGNOSIS — M75122 Complete rotator cuff tear or rupture of left shoulder, not specified as traumatic: Secondary | ICD-10-CM

## 2012-08-13 DIAGNOSIS — Z01812 Encounter for preprocedural laboratory examination: Secondary | ICD-10-CM | POA: Insufficient documentation

## 2012-08-13 DIAGNOSIS — M719 Bursopathy, unspecified: Principal | ICD-10-CM | POA: Insufficient documentation

## 2012-08-13 DIAGNOSIS — M25819 Other specified joint disorders, unspecified shoulder: Secondary | ICD-10-CM | POA: Insufficient documentation

## 2012-08-13 HISTORY — PX: SHOULDER OPEN ROTATOR CUFF REPAIR: SHX2407

## 2012-08-13 HISTORY — PX: LIPOMA EXCISION: SHX5283

## 2012-08-13 LAB — POTASSIUM: Potassium: 3.5 mEq/L (ref 3.5–5.1)

## 2012-08-13 SURGERY — REPAIR, ROTATOR CUFF, OPEN
Anesthesia: General | Site: Shoulder | Laterality: Left | Wound class: Clean

## 2012-08-13 MED ORDER — MORPHINE SULFATE (PF) 1 MG/ML IV SOLN
INTRAVENOUS | Status: DC
  Administered 2012-08-13: 19:00:00 via INTRAVENOUS
  Administered 2012-08-13: 19.45 mg via INTRAVENOUS
  Administered 2012-08-13: 14:00:00 via INTRAVENOUS
  Administered 2012-08-14: 10 mg via INTRAVENOUS
  Administered 2012-08-14: 03:00:00 via INTRAVENOUS
  Administered 2012-08-14: 9 mg via INTRAVENOUS
  Filled 2012-08-13 (×3): qty 25

## 2012-08-13 MED ORDER — METOCLOPRAMIDE HCL 5 MG/ML IJ SOLN: 5.0000 mg | Freq: Three times a day (TID) | INTRAMUSCULAR | Status: DC | PRN

## 2012-08-13 MED ORDER — FUROSEMIDE 40 MG PO TABS
40.0000 mg | ORAL_TABLET | Freq: Every morning | ORAL | Status: DC
  Administered 2012-08-14: 40 mg via ORAL
  Filled 2012-08-13: qty 1

## 2012-08-13 MED ORDER — CEFAZOLIN SODIUM-DEXTROSE 2-3 GM-% IV SOLR
2.0000 g | INTRAVENOUS | Status: AC
  Administered 2012-08-13: 2 g via INTRAVENOUS

## 2012-08-13 MED ORDER — ACETAMINOPHEN 10 MG/ML IV SOLN
INTRAVENOUS | Status: DC | PRN
  Administered 2012-08-13: 1000 mg via INTRAVENOUS

## 2012-08-13 MED ORDER — VITAMIN D 1000 UNITS PO CAPS: 1000.0000 [IU] | ORAL_CAPSULE | Freq: Every day | ORAL | Status: DC

## 2012-08-13 MED ORDER — BUPIVACAINE-EPINEPHRINE 0.5% -1:200000 IJ SOLN
INTRAMUSCULAR | Status: DC | PRN
  Administered 2012-08-13: 20 mL

## 2012-08-13 MED ORDER — MAGNESIUM 400 MG PO CAPS: 400.0000 mg | ORAL_CAPSULE | Freq: Every day | ORAL | Status: DC

## 2012-08-13 MED ORDER — VITAMIN D3 25 MCG (1000 UNIT) PO TABS
1000.0000 [IU] | ORAL_TABLET | Freq: Every day | ORAL | Status: DC
  Administered 2012-08-14: 1000 [IU] via ORAL
  Filled 2012-08-13: qty 1

## 2012-08-13 MED ORDER — NEOSTIGMINE METHYLSULFATE 1 MG/ML IJ SOLN
INTRAMUSCULAR | Status: DC | PRN
  Administered 2012-08-13: 3 mg via INTRAVENOUS

## 2012-08-13 MED ORDER — KRILL OIL ULTRA STRENGTH 1500 MG PO CAPS: 1500.0000 mg | ORAL_CAPSULE | Freq: Every day | ORAL | Status: DC

## 2012-08-13 MED ORDER — THEOPHYLLINE ER 300 MG PO TB12
300.0000 mg | ORAL_TABLET | Freq: Two times a day (BID) | ORAL | Status: DC
  Administered 2012-08-13 – 2012-08-14 (×2): 300 mg via ORAL
  Filled 2012-08-13 (×3): qty 1

## 2012-08-13 MED ORDER — ALBUTEROL SULFATE ER 4 MG PO TB12
4.0000 mg | ORAL_TABLET | Freq: Two times a day (BID) | ORAL | Status: DC
  Administered 2012-08-13 – 2012-08-14 (×2): 4 mg via ORAL
  Filled 2012-08-13 (×3): qty 1

## 2012-08-13 MED ORDER — ONDANSETRON HCL 4 MG/2ML IJ SOLN
INTRAMUSCULAR | Status: DC | PRN
  Administered 2012-08-13: 4 mg via INTRAVENOUS

## 2012-08-13 MED ORDER — DOCUSATE SODIUM 100 MG PO CAPS: 200.0000 mg | ORAL_CAPSULE | Freq: Every evening | ORAL | Status: DC | PRN

## 2012-08-13 MED ORDER — POTASSIUM CL IN DEXTROSE 5% 20 MEQ/L IV SOLN
20.0000 meq | INTRAVENOUS | Status: DC
  Administered 2012-08-13: 20 meq via INTRAVENOUS
  Filled 2012-08-13 (×4): qty 1000

## 2012-08-13 MED ORDER — BISACODYL 5 MG PO TBEC: 5.0000 mg | DELAYED_RELEASE_TABLET | Freq: Every day | ORAL | Status: DC | PRN

## 2012-08-13 MED ORDER — ALBUTEROL SULFATE HFA 108 (90 BASE) MCG/ACT IN AERS
2.0000 | INHALATION_SPRAY | Freq: Four times a day (QID) | RESPIRATORY_TRACT | Status: DC | PRN
  Filled 2012-08-13: qty 6.7

## 2012-08-13 MED ORDER — GLYCOPYRROLATE 0.2 MG/ML IJ SOLN
INTRAMUSCULAR | Status: DC | PRN
  Administered 2012-08-13: 0.4 mg via INTRAVENOUS

## 2012-08-13 MED ORDER — POTASSIUM CHLORIDE CRYS ER 20 MEQ PO TBCR
20.0000 meq | EXTENDED_RELEASE_TABLET | Freq: Every day | ORAL | Status: DC
  Administered 2012-08-14: 20 meq via ORAL
  Filled 2012-08-13: qty 1

## 2012-08-13 MED ORDER — ONDANSETRON HCL 4 MG PO TABS: 4.0000 mg | ORAL_TABLET | Freq: Four times a day (QID) | ORAL | Status: DC | PRN

## 2012-08-13 MED ORDER — FENTANYL CITRATE 0.05 MG/ML IJ SOLN
INTRAMUSCULAR | Status: DC | PRN
  Administered 2012-08-13 (×4): 50 ug via INTRAVENOUS

## 2012-08-13 MED ORDER — DIPHENHYDRAMINE HCL 12.5 MG/5ML PO ELIX: 12.5000 mg | ORAL_SOLUTION | Freq: Four times a day (QID) | ORAL | Status: DC | PRN

## 2012-08-13 MED ORDER — ALBUTEROL SULFATE (5 MG/ML) 0.5% IN NEBU: 2.5000 mg | INHALATION_SOLUTION | Freq: Four times a day (QID) | RESPIRATORY_TRACT | Status: DC | PRN

## 2012-08-13 MED ORDER — METOCLOPRAMIDE HCL 10 MG PO TABS: 5.0000 mg | ORAL_TABLET | Freq: Three times a day (TID) | ORAL | Status: DC | PRN

## 2012-08-13 MED ORDER — MOMETASONE FURO-FORMOTEROL FUM 200-5 MCG/ACT IN AERO
2.0000 | INHALATION_SPRAY | Freq: Two times a day (BID) | RESPIRATORY_TRACT | Status: DC
  Administered 2012-08-13 – 2012-08-14 (×2): 2 via RESPIRATORY_TRACT
  Filled 2012-08-13: qty 8.8

## 2012-08-13 MED ORDER — CALCIUM CARBONATE-VITAMIN D 500-200 MG-UNIT PO TABS
1.0000 | ORAL_TABLET | Freq: Three times a day (TID) | ORAL | Status: DC
  Administered 2012-08-13 – 2012-08-14 (×2): 1 via ORAL
  Filled 2012-08-13 (×4): qty 1

## 2012-08-13 MED ORDER — PROMETHAZINE HCL 25 MG/ML IJ SOLN: 6.2500 mg | INTRAMUSCULAR | Status: DC | PRN

## 2012-08-13 MED ORDER — SODIUM CHLORIDE 0.9 % IJ SOLN: 9.0000 mL | INTRAMUSCULAR | Status: DC | PRN

## 2012-08-13 MED ORDER — ONDANSETRON HCL 4 MG/2ML IJ SOLN: 4.0000 mg | Freq: Four times a day (QID) | INTRAMUSCULAR | Status: DC | PRN

## 2012-08-13 MED ORDER — MEPERIDINE HCL 50 MG/ML IJ SOLN: 6.2500 mg | INTRAMUSCULAR | Status: DC | PRN

## 2012-08-13 MED ORDER — CEFAZOLIN SODIUM-DEXTROSE 2-3 GM-% IV SOLR
2.0000 g | Freq: Four times a day (QID) | INTRAVENOUS | Status: AC
  Administered 2012-08-13 – 2012-08-14 (×3): 2 g via INTRAVENOUS
  Filled 2012-08-13 (×3): qty 50

## 2012-08-13 MED ORDER — PROPOFOL 10 MG/ML IV BOLUS
INTRAVENOUS | Status: DC | PRN
  Administered 2012-08-13: 160 mg via INTRAVENOUS

## 2012-08-13 MED ORDER — FAMOTIDINE 20 MG PO TABS
20.0000 mg | ORAL_TABLET | Freq: Every day | ORAL | Status: DC
  Administered 2012-08-14: 20 mg via ORAL
  Filled 2012-08-13: qty 1

## 2012-08-13 MED ORDER — 0.9 % SODIUM CHLORIDE (POUR BTL) OPTIME
TOPICAL | Status: DC | PRN
  Administered 2012-08-13: 1000 mL

## 2012-08-13 MED ORDER — LIDOCAINE HCL (CARDIAC) 20 MG/ML IV SOLN
INTRAVENOUS | Status: DC | PRN
  Administered 2012-08-13: 50 mg via INTRAVENOUS

## 2012-08-13 MED ORDER — CALCIUM CARBONATE-VITAMIN D 600-400 MG-UNIT PO TABS: 1.0000 | ORAL_TABLET | Freq: Three times a day (TID) | ORAL | Status: DC

## 2012-08-13 MED ORDER — NALOXONE HCL 0.4 MG/ML IJ SOLN: 0.4000 mg | INTRAMUSCULAR | Status: DC | PRN

## 2012-08-13 MED ORDER — DIPHENHYDRAMINE HCL 50 MG/ML IJ SOLN: 12.5000 mg | Freq: Four times a day (QID) | INTRAMUSCULAR | Status: DC | PRN

## 2012-08-13 MED ORDER — FENTANYL CITRATE 0.05 MG/ML IJ SOLN
25.0000 ug | INTRAMUSCULAR | Status: DC | PRN
  Administered 2012-08-13 (×2): 25 ug via INTRAVENOUS
  Administered 2012-08-13: 50 ug via INTRAVENOUS

## 2012-08-13 MED ORDER — CO-ENZYME Q-10 100 MG PO CAPS: 200.0000 mg | ORAL_CAPSULE | Freq: Every day | ORAL | Status: DC

## 2012-08-13 MED ORDER — ADULT MULTIVITAMIN W/MINERALS CH
1.0000 | ORAL_TABLET | Freq: Every day | ORAL | Status: DC
  Administered 2012-08-14: 1 via ORAL
  Filled 2012-08-13: qty 1

## 2012-08-13 MED ORDER — LACTATED RINGERS IV SOLN
INTRAVENOUS | Status: DC
  Administered 2012-08-13: 11:00:00 via INTRAVENOUS
  Administered 2012-08-13: 1000 mL via INTRAVENOUS

## 2012-08-13 MED ORDER — MIDAZOLAM HCL 5 MG/5ML IJ SOLN
INTRAMUSCULAR | Status: DC | PRN
  Administered 2012-08-13: 2 mg via INTRAVENOUS

## 2012-08-13 MED ORDER — LACTATED RINGERS IV SOLN: INTRAVENOUS | Status: DC

## 2012-08-13 MED ORDER — MONTELUKAST SODIUM 10 MG PO TABS
10.0000 mg | ORAL_TABLET | Freq: Every day | ORAL | Status: DC
  Administered 2012-08-13: 10 mg via ORAL
  Filled 2012-08-13 (×2): qty 1

## 2012-08-13 MED ORDER — OXYCODONE-ACETAMINOPHEN 5-325 MG PO TABS
1.0000 | ORAL_TABLET | ORAL | Status: DC | PRN
  Administered 2012-08-14 (×2): 1 via ORAL
  Filled 2012-08-13 (×2): qty 1

## 2012-08-13 MED ORDER — ASPIRIN EC 81 MG PO TBEC
81.0000 mg | DELAYED_RELEASE_TABLET | Freq: Every day | ORAL | Status: DC
  Filled 2012-08-13: qty 1

## 2012-08-13 MED ORDER — METHOCARBAMOL 100 MG/ML IJ SOLN
500.0000 mg | Freq: Four times a day (QID) | INTRAMUSCULAR | Status: DC | PRN
  Administered 2012-08-13: 500 mg via INTRAVENOUS
  Filled 2012-08-13: qty 5

## 2012-08-13 MED ORDER — MAGNESIUM OXIDE 400 (241.3 MG) MG PO TABS
400.0000 mg | ORAL_TABLET | Freq: Every day | ORAL | Status: DC
  Administered 2012-08-14: 400 mg via ORAL
  Filled 2012-08-13: qty 1

## 2012-08-13 MED ORDER — MULTIVITAMINS PO CAPS: 1.0000 | ORAL_CAPSULE | Freq: Every day | ORAL | Status: DC

## 2012-08-13 MED ORDER — GABAPENTIN 300 MG PO CAPS
300.0000 mg | ORAL_CAPSULE | Freq: Three times a day (TID) | ORAL | Status: DC
  Administered 2012-08-13 – 2012-08-14 (×2): 300 mg via ORAL
  Filled 2012-08-13 (×4): qty 1

## 2012-08-13 MED ORDER — ROCURONIUM BROMIDE 100 MG/10ML IV SOLN
INTRAVENOUS | Status: DC | PRN
  Administered 2012-08-13: 10 mg via INTRAVENOUS
  Administered 2012-08-13: 40 mg via INTRAVENOUS

## 2012-08-13 MED ORDER — ASPIRIN 81 MG PO TABS: 81.0000 mg | ORAL_TABLET | Freq: Every day | ORAL | Status: DC

## 2012-08-13 SURGICAL SUPPLY — 46 items
6.5MM PEEK ZIP ANCHOR W/NEEDLES ×2 IMPLANT
ANCH SUT 2 5.5 BABSR ASCP (Orthopedic Implant) ×1 IMPLANT
ANCHOR PEEK ZIP 5.5 NDL NO2 (Orthopedic Implant) ×2 IMPLANT
BAG SPEC THK2 15X12 ZIP CLS (MISCELLANEOUS) ×1
BAG ZIPLOCK 12X15 (MISCELLANEOUS) ×2 IMPLANT
BLADE OSCILLATING/SAGITTAL (BLADE) ×2
BLADE SW THK.38XMED LNG THN (BLADE) ×1 IMPLANT
CLEANER TIP ELECTROSURG 2X2 (MISCELLANEOUS) ×2 IMPLANT
CLOTH BEACON ORANGE TIMEOUT ST (SAFETY) ×2 IMPLANT
CONT SPECI 4OZ STER CLIK (MISCELLANEOUS) ×2 IMPLANT
COVER SURGICAL LIGHT HANDLE (MISCELLANEOUS) ×2 IMPLANT
DRAPE LG THREE QUARTER DISP (DRAPES) ×2 IMPLANT
DRAPE POUCH INSTRU U-SHP 10X18 (DRAPES) ×2 IMPLANT
DRAPE U-SHAPE 47X51 STRL (DRAPES) ×2 IMPLANT
DRSG EMULSION OIL 3X3 NADH (GAUZE/BANDAGES/DRESSINGS) ×2 IMPLANT
DRSG PAD ABDOMINAL 8X10 ST (GAUZE/BANDAGES/DRESSINGS) ×2 IMPLANT
DURAPREP 26ML APPLICATOR (WOUND CARE) ×2 IMPLANT
ELECT REM PT RETURN 9FT ADLT (ELECTROSURGICAL) ×2
ELECTRODE REM PT RTRN 9FT ADLT (ELECTROSURGICAL) ×1 IMPLANT
FACESHIELD LNG OPTICON STERILE (SAFETY) ×4 IMPLANT
GLOVE BIO SURGEON STRL SZ7.5 (GLOVE) ×2 IMPLANT
GLOVE BIOGEL M 8.0 STRL (GLOVE) ×2 IMPLANT
GLOVE ECLIPSE 8.0 STRL XLNG CF (GLOVE) ×2 IMPLANT
GLOVE INDICATOR 8.0 STRL GRN (GLOVE) ×6 IMPLANT
GOWN STRL REIN XL XLG (GOWN DISPOSABLE) ×4 IMPLANT
KIT BASIN OR (CUSTOM PROCEDURE TRAY) ×2 IMPLANT
LABEL STERILE EO BLANK 1X3 WHT (LABEL) ×2 IMPLANT
MANIFOLD NEPTUNE II (INSTRUMENTS) ×2 IMPLANT
NEEDLE MA TROC 1/2 (NEEDLE) IMPLANT
NS IRRIG 1000ML POUR BTL (IV SOLUTION) ×2 IMPLANT
PACK SHOULDER CUSTOM OPM052 (CUSTOM PROCEDURE TRAY) ×2 IMPLANT
POSITIONER SURGICAL ARM (MISCELLANEOUS) ×2 IMPLANT
SLING ARM IMMOBILIZER LRG (SOFTGOODS) ×2 IMPLANT
SLING ULTRA II L (ORTHOPEDIC SUPPLIES) ×2 IMPLANT
SPONGE GAUZE 4X4 12PLY (GAUZE/BANDAGES/DRESSINGS) ×2 IMPLANT
SPONGE LAP 4X18 X RAY DECT (DISPOSABLE) ×6 IMPLANT
SPONGE SURGIFOAM ABS GEL 12-7 (HEMOSTASIS) ×2 IMPLANT
STAPLER VISISTAT 35W (STAPLE) ×2 IMPLANT
SUT BONE WAX W31G (SUTURE) ×2 IMPLANT
SUT ETHIBOND NAB CT1 #1 30IN (SUTURE) IMPLANT
SUT VIC AB 1 CT1 27 (SUTURE) ×6
SUT VIC AB 1 CT1 27XBRD ANTBC (SUTURE) ×3 IMPLANT
SUT VIC AB 2-0 CT1 27 (SUTURE) ×8
SUT VIC AB 2-0 CT1 27XBRD (SUTURE) ×2 IMPLANT
SUT VIC AB 2-0 CT1 TAPERPNT 27 (SUTURE) ×2 IMPLANT
TAPE PAPER 3X10 WHT MICROPORE (GAUZE/BANDAGES/DRESSINGS) ×2 IMPLANT

## 2012-08-13 NOTE — Anesthesia Preprocedure Evaluation (Addendum)
Anesthesia Evaluation  Patient identified by MRN, date of birth, ID band Patient awake    Reviewed: Allergy & Precautions, H&P , NPO status , Patient's Chart, lab work & pertinent test results  Airway Mallampati: II TM Distance: >3 FB Neck ROM: Full    Dental No notable dental hx.    Pulmonary shortness of breath, asthma , COPD COPD inhaler,  breath sounds clear to auscultation  Pulmonary exam normal       Cardiovascular hypertension, Pt. on medications Rhythm:Regular Rate:Normal     Neuro/Psych negative neurological ROS  negative psych ROS   GI/Hepatic Neg liver ROS, hiatal hernia,   Endo/Other  Morbid obesity  Renal/GU negative Renal ROS  negative genitourinary   Musculoskeletal negative musculoskeletal ROS (+)   Abdominal   Peds negative pediatric ROS (+)  Hematology negative hematology ROS (+)   Anesthesia Other Findings   Reproductive/Obstetrics negative OB ROS                          Anesthesia Physical Anesthesia Plan  ASA: III  Anesthesia Plan: General   Post-op Pain Management:    Induction: Intravenous  Airway Management Planned: Oral ETT  Additional Equipment:   Intra-op Plan:   Post-operative Plan: Extubation in OR  Informed Consent: I have reviewed the patients History and Physical, chart, labs and discussed the procedure including the risks, benefits and alternatives for the proposed anesthesia with the patient or authorized representative who has indicated his/her understanding and acceptance.   Dental advisory given  Plan Discussed with: CRNA  Anesthesia Plan Comments: (Pt is not a candidate for brachial plexus block. Elevated R hemi-diaphragm. Would not tolerate loss of phrenic nerve)        Anesthesia Quick Evaluation

## 2012-08-13 NOTE — Anesthesia Postprocedure Evaluation (Signed)
  Anesthesia Post-op Note  Patient: Lisa Hamilton  Procedure(s) Performed: Procedure(s) (LRB): ROTATOR CUFF REPAIR SHOULDER OPEN (Left) EXCISION LIPOMA (Left)  Patient Location: PACU  Anesthesia Type: General  Level of Consciousness: awake and alert   Airway and Oxygen Therapy: Patient Spontanous Breathing  Post-op Pain: mild  Post-op Assessment: Post-op Vital signs reviewed, Patient's Cardiovascular Status Stable, Respiratory Function Stable, Patent Airway and No signs of Nausea or vomiting  Last Vitals:  Filed Vitals:   08/13/12 1330  BP: 172/95  Pulse: 80  Temp: 36.7 C  Resp: 18    Post-op Vital Signs: stable   Complications: No apparent anesthesia complications

## 2012-08-13 NOTE — H&P (Signed)
Lisa Hamilton is an 67 y.o. female.   Chief Complaint: painful lt shoulder HPIMRI demonstrates full rotator cuff tear and large lipoma posterior shoulder  Past Medical History  Diagnosis Date  . Glaucoma(365) 1977    EYE SURGERY  FOR TX OF GLAUCOMA--NO LONGER HAS GLAUCOMA  . COPD (chronic obstructive pulmonary disease)   . Asthmatic bronchitis   . Hypertension   . Venous insufficiency   . Hypercholesteremia     NO LONGER A PROBLEM--NO LONGER ON CHOLESTEROL LOWERING MEDS  . Nontoxic multinodular goiter   . Obesity   . Hiatal hernia   . Diverticulosis of colon   . Hx of colonic polyps   . Hepatic cyst   . Medullary sponge kidney   . Renal calculus   . DJD (degenerative joint disease)   . Osteoporosis   . Vitamin D deficiency   . Restless leg syndrome   . Lipoma   . Shingles 2009  . Hepatic cyst   . Chest pain     CHEST PAIN WITH DIFFICULTY BREATHING-RIB PAIN - NOT HEART PAIN  . Leg swelling     BILATERAL -BUT SWELLING LEFT LEG WORSE  . Rectal bleeding     ONLY WITH CONSTIPATION  . Constipation   . Easy bruising   . Kidney stone   . Seizures     long time ago  . Shortness of breath     WITH EXERTION  . Pain     HX OF PINCHED NERVE - LUMBAR THAT SOMETIMES CAUSES PAIN LEFT SCIATIC NERVE-GRABBING PAIN--PT WILL HAVE TO "STRETCH"  HER BACK AND LEG BEFORE SHE CAN WALK    Past Surgical History  Procedure Date  . Tonsillectomy 1967  . Glaucoma surgery 1977  . Bilateral wrist fracture, right 1996    left 2001, Dr. Teressa Senter  . Bilateral carpel tunnel repairs     Dr. Simonne Come  left--1999   right--2008  . Squamous cell carcinoma excision 2010  . Laparoscopic liver cyst fenestration   . Hernia repair 1963  . Hernia repair 2012    umb hernia    Family History  Problem Relation Age of Onset  . Cancer Father     lung  . Cancer Sister     lung   Social History:  reports that she has never smoked. She has never used smokeless tobacco. She reports that she does not drink  alcohol or use illicit drugs.  Allergies:  Allergies  Allergen Reactions  . Adhesive (Tape) Other (See Comments)    blisters  . Codeine Other (See Comments)    nightmares  . Amoxicillin-Pot Clavulanate Rash    Medications Prior to Admission  Medication Sig Dispense Refill  . albuterol (PROVENTIL HFA;VENTOLIN HFA) 108 (90 BASE) MCG/ACT inhaler Inhale 2 puffs into the lungs every 6 (six) hours as needed. Wheezing  3 Inhaler  3  . albuterol (PROVENTIL) (2.5 MG/3ML) 0.083% nebulizer solution Take 3 mLs (2.5 mg total) by nebulization 2 (two) times daily.  450 mL  3  . aspirin 81 MG tablet Take 2 tablets by mouth at bedtime      . bisacodyl (DULCOLAX) 5 MG EC tablet Take 5 mg by mouth daily as needed. For constipation      . Calcium Carbonate-Vitamin D 600-400 MG-UNIT per tablet Take 1 tablet by mouth 3 (three) times daily.      . Cholecalciferol (VITAMIN D) 1000 UNITS capsule Take 1,000 Units by mouth daily. VITAMIN D 3      .  Co-Enzyme Q-10 100 MG CAPS Take 200 mg by mouth daily.       Marland Kitchen docusate sodium (COLACE) 100 MG capsule Take 200 mg by mouth at bedtime as needed. For constipation      . Fluticasone-Salmeterol (ADVAIR DISKUS) 500-50 MCG/DOSE AEPB Inhale 1 puff into the lungs every 12 (twelve) hours.  3 each  3  . furosemide (LASIX) 40 MG tablet Take 40 mg by mouth every morning.      . gabapentin (NEURONTIN) 300 MG capsule Take 300 mg by mouth 3 (three) times daily.      [place] Lanius Ultra Strength 1500 MG CAPS Take 1,500 mg by mouth daily.      . Magnesium 400 MG CAPS Take 400 mg by mouth daily.      . montelukast (SINGULAIR) 10 MG tablet Take 1 tablet (10 mg total) by mouth at bedtime.  90 tablet  3  . Multiple Vitamin (MULTIVITAMIN) capsule Take 1 capsule by mouth daily. Iron free      . potassium chloride SA (K-DUR,KLOR-CON) 20 MEQ tablet Take 1 tablet (20 mEq total) by mouth daily.  90 tablet  3  . ranitidine (ZANTAC) 150 MG tablet Take 1 tablet (150 mg total) by mouth at bedtime.   90 tablet  3  . theophylline (THEODUR) 300 MG 12 hr tablet Take 1 tablet (300 mg total) by mouth 2 (two) times daily.  180 tablet  3  . albuterol (VOSPIRE ER) 4 MG 12 hr tablet Take 1 tablet (4 mg total) by mouth every 12 (twelve) hours.  180 tablet  3  . traMADol (ULTRAM) 50 MG tablet Take 1 tablet (50 mg total) by mouth every 6 (six) hours as needed. Pain  90 tablet  3    Results for orders placed during the hospital encounter of 08/13/12 (from the past 48 hour(s))  POTASSIUM     Status: Normal   Collection Time   08/13/12  8:24 AM      Component Value Range Comment   Potassium 3.5  3.5 - 5.1 mEq/L    No results found.  ROS  Blood pressure 173/92, pulse 93, temperature 99.1 F (37.3 C), temperature source Oral, resp. rate 18, SpO2 100.00%. Physical Exam  Constitutional: She is oriented to person, place, and time. She appears well-developed and well-nourished.       obese  HENT:  Head: Normocephalic and atraumatic.  Right Ear: External ear normal.  Left Ear: External ear normal.  Nose: Nose normal.  Eyes: Conjunctivae normal and EOM are normal. Pupils are equal, round, and reactive to light.  Neck: Normal range of motion. Neck supple.  Cardiovascular: Normal rate, regular rhythm, normal heart sounds and intact distal pulses.   Respiratory: Effort normal and breath sounds normal.  GI: Soft. Bowel sounds are normal.  Musculoskeletal: She exhibits tenderness.       Tender over rotator cuff and large lipoma posterior lt shoulder  Neurological: She is alert and oriented to person, place, and time. She has normal reflexes.  Skin: Skin is warm and dry.  Psychiatric: She has a normal mood and affect. Her behavior is normal. Judgment and thought content normal.     Assessment/Plan Rotator cuff tear and large lipoma lt shoulder Open anterior acromionectomy and repair torn rotator cuff;excision lipoma left shoulder  APLINGTON,JAMES P 08/13/2012, 9:35 AM

## 2012-08-13 NOTE — Brief Op Note (Signed)
08/13/2012  12:26 PM  PATIENT:  Lisa Hamilton  67 y.o. female  PRE-OPERATIVE DIAGNOSIS:  LEFT SHOULDER ROTATOR CUFF TEAR AND LARGE LIPOMA  POST-OPERATIVE DIAGNOSIS:  LEFT SHOULDER ROTATOR CUFF TEAR AND LARGE, LEFT SHOULDER LIPOMA  PROCEDURE:  Procedure(s) (LRB) with comments: ROTATOR CUFF REPAIR SHOULDER OPEN (Left) - LEFT SHOULDER ANTERIOR ACROMINECTOMY AND ROTATOR CUFF REPAIR AND EXCISION LEFT SHOULDER LIPOMA EXCISION LIPOMA (Left)  SURGEON:  Surgeon(s) and Role:    * Drucilla Schmidt, MD - Primary  PHYSICIAN ASSISTANT:   ASSISTANTS: Skip Mayer [facility]  ANESTHESIA:   local and general  EBL:  Total I/O In: 1500 [I.V.:1500] Out: -   BLOOD ADMINISTERED:none  DRAINS: none   LOCAL MEDICATIONS USED:  MARCAINE     SPECIMEN:  Excision  DISPOSITION OF SPECIMEN:  PATHOLOGY  COUNTS:  YES  TOURNIQUET:  * No tourniquets in log *  DICTATION: .Other Dictation: Dictation Number [id number]  PLAN OF CARE: Admit for overnight observation  PATIENT DISPOSITION:  PACU - hemodynamically stable.   Delay start of Pharmacological VTE agent (>24hrs) due to surgical blood loss or risk of bleeding: yes

## 2012-08-13 NOTE — Transfer of Care (Signed)
Immediate Anesthesia Transfer of Care Note  Patient: Lisa Hamilton  Procedure(s) Performed: Procedure(s) (LRB) with comments: ROTATOR CUFF REPAIR SHOULDER OPEN (Left) - LEFT SHOULDER ANTERIOR ACROMINECTOMY AND ROTATOR CUFF REPAIR AND EXCISION LEFT SHOULDER LIPOMA EXCISION LIPOMA (Left)  Patient Location: PACU  Anesthesia Type:General  Level of Consciousness: awake, alert  and oriented  Airway & Oxygen Therapy: Patient Spontanous Breathing and Patient connected to face mask oxygen  Post-op Assessment: Report given to PACU RN and Post -op Vital signs reviewed and stable  Post vital signs: Reviewed and stable  Complications: No apparent anesthesia complications

## 2012-08-14 ENCOUNTER — Encounter (HOSPITAL_COMMUNITY): Payer: Self-pay | Admitting: Orthopedic Surgery

## 2012-08-14 MED ORDER — KETOROLAC TROMETHAMINE 10 MG PO TABS: 10.0000 mg | ORAL_TABLET | Freq: Four times a day (QID) | ORAL | Status: DC | PRN

## 2012-08-14 MED ORDER — HYDROCODONE-ACETAMINOPHEN 10-325 MG PO TABS: 1.0000 | ORAL_TABLET | Freq: Four times a day (QID) | ORAL | Status: DC | PRN

## 2012-08-14 MED ORDER — METHOCARBAMOL 500 MG PO TABS: 500.0000 mg | ORAL_TABLET | Freq: Four times a day (QID) | ORAL | Status: DC

## 2012-08-14 MED FILL — Ropivacaine HCl Inj 5 MG/ML: INTRAMUSCULAR | Qty: 30 | Status: AC

## 2012-08-14 NOTE — Progress Notes (Signed)
CARE MANAGEMENT NOTE 08/14/2012  Patient:  Lisa Hamilton, Lisa Hamilton   Account Number:  [id number]  Date Initiated:  08/14/2012  Documentation initiated by:  Colleen Can  Subjective/Objective Assessment:   dx left shoulder rotator cuff tera, lipoma left shoulder; rotator cuff repai anr excision large lipoma     Action/Plan:   CM spoke with patient and spouse. Plans are for patient to return to her home in [place] where spouse will be caregiver. She is able to ambulate to BR. Rotator cuff brace in place. No HH needs   Anticipated DC Date:  08/14/2012   Anticipated DC Plan:  HOME/SELF CARE  In-house referral  NA      DC Planning Services  CM consult      PAC Choice  NA   Choice offered to / List presented to:  NA   DME arranged  NA      DME agency  NA     HH arranged  NA      HH agency  NA   Status of service:  Completed, signed off Medicare Important Message given?  NO (If response is "NO", the following Medicare IM given date fields will be blank) Date Medicare IM given:   Date Additional Medicare IM given:    Discharge Disposition:  HOME/SELF CARE  Per UR Regulation:  Reviewed for med. necessity/level of care/duration of stay

## 2012-08-14 NOTE — Progress Notes (Signed)
Discharged via wheelchair. States understanding of discharge instructions and to keep left shoulder immobile. Rx for norco, toradol, and robaxin given.

## 2012-08-14 NOTE — Op Note (Signed)
NAME:  Lisa Hamilton, Lisa Hamilton                  ACCOUNT NO.:  [id number]  MEDICAL RECORD NO.:  [id number]  LOCATION:  1615                         FACILITY:  [facility]  PHYSICIAN:  Marlowe Kays, M.D.  DATE OF BIRTH:  [birth year]  DATE OF PROCEDURE:  08/13/2012 DATE OF DISCHARGE:                              OPERATIVE REPORT   PREOPERATIVE DIAGNOSES: 1. Complete rotator cuff tear. 2. Lipoma, superoposterior left shoulder.  POSTOPERATIVE DIAGNOSES: 1. Complete rotator cuff tear. 2. Lipoma, superoposterior left shoulder.  OPERATIONS: 1. Excision of large lipoma, superior posterior left shoulder. 2. Anterior acromionectomy with repair of complex rotator cuff tear.  SURGEON:  Marlowe Kays, MD  ASSISTANT:  Skip Mayer [facility]  ANESTHESIA:  General.  PATHOLOGY AND JUSTIFICATION FOR PROCEDURE:  As stated in diagnosis.  DESCRIPTION OF PROCEDURE:  She did not have an interscalene block per Anesthesia who felt it might create some complications with her lung disease.  Satisfied general anesthesia, prophylactic antibiotics, beach- chair position on the [place] frame.  Left shoulder girdle and left upper extremity were prepped with DuraPrep, draped in sterile field.  Time-out performed.  I marked out the perimeter of the large lipoma and then incorporated this into incision that allowed me to take out the lipoma as well as to the repair on the rotator cuff.  Incision was carried down through the subcutaneous tissue and a large lipoma came forth under pressure.  This was excised down to the underlying muscle and sent to Pathology.  It had a very benign appearance.  I then, with a cutting cautery, opened the fascia over the anterior acromion back to the [facility] joint and exposed the anterior acromion.  Then, undermined this with a small Cobb elevator followed by larger Cobb elevator.  I made my initial anterior acromionectomy with micro saw.  She has significant impingement problem and I made additional  inferior acromial cuts with a saw until her rotator cuff was widely decompressed.  The rotator cuff medially was not visible at all.  Remnant retracted flaps were apparent anteriorly and posteriorly on rotation of the arm.  There was a large gap with V up underneath the acromion.  The biceps tendon was intact.  After doing a partial bursectomy, I then freed up the anterior and posterior flaps which did not immediately come together.  I used the first of 2 anchors, this being a 5.5, 4 stranded Stryker anchor, and I was able to advance the 2 flaps laterally with them.  I then used remnants of the suture from these strands to try piecemeal the serrated complex comminuted tear together, working up underneath the acromion and working lateralward.  I then supplemented this with a second anchor of 6.5 mm to give better purchase in her bone.  Using this anchor and additional interrupted sutures and with her arm abducted about 60 degrees on a Mayo stand, I was able to reapproximate her rotator cuff up to its anatomic position. Wound was then irrigated with sterile saline.  I injected the soft tissues prior to surgery with 0.5% Marcaine with adrenaline and also did the same now.  The fascia over the anterior acromion and the slit in the deltoid  interval was reapproximated with interrupted #1 Vicryl. Subcutaneous tissue was closed with combination of #1 2-0 Vicryl and staples in the skin.  Betadine, Adaptic, dry sterile dressing were applied.  Her arm was then carefully placed in an abduction pillow with abduction about 45-60 degrees.  She was taken to the recovery room in satisfactory addition with no known complications.          ______________________________ Marlowe Kays, M.D.     JA/MEDQ  D:  08/13/2012  T:  08/14/2012  Job:  [id number]

## 2012-08-14 NOTE — Progress Notes (Signed)
Spoke with Dr. Simonne Come, informed him patient c/o some tingling to left hand fingers MD states this is most likely due to position of elbow and should be fine patient can move fingers but is not to move shoulder or elbow at all and no PT or OT is needed before discharge.

## 2012-08-14 NOTE — Discharge Summary (Signed)
NAMEMarland Hamilton  KAELAN, EMAMI                  ACCOUNT NO.:  [id number]  MEDICAL RECORD NO.:  [id number]  LOCATION:  1615                         FACILITY:  [facility]  PHYSICIAN:  Marlowe Kays, M.D.  DATE OF BIRTH:  Jun 11, 1946  DATE OF ADMISSION:  08/13/2012 DATE OF DISCHARGE:  08/14/2012                              DISCHARGE SUMMARY   ADMITTING DIAGNOSES: 1. Complete rotator cuff tear. 2. Large lipoma, left shoulder.  DISCHARGE DIAGNOSES: 1. Complete rotator cuff tear. 2. Large lipoma, left shoulder.  OPERATION:  On August 13, 2012, 1. Excision of large lipoma superior, posterior left shoulder. 2. Anterior acromionectomy with repair of complex rotator cuff tear.  SUMMARY:  She has had progressive pain in her left shoulder with an MRI demonstrating the admission diagnoses.  Her admission laboratory work was satisfactory.  She had a low potassium earlier on preadmission workup of 3.0 and I had her take additional supplementary potassium at home and was 3.6 at the time of surgery.  The surgery itself went uneventfully.  She was unable to have an interscalene block, and she is comfortable now on oral and IV pain medication.  She desires to go home today.  Plan is to have her return to my office on January 13 or January 14. She will be given home medications, but also prescriptions for Norco 10/325, Toradol, and Robaxin.  Instructions to keep the shoulder dry and to stay in the abduction pillow were given.  CONDITION AT DISCHARGE:  Stable and improved.          ______________________________ Marlowe Kays, M.D.     JA/MEDQ  D:  08/14/2012  T:  08/14/2012  Job:  [id number]

## 2012-08-14 NOTE — Progress Notes (Signed)
Patient ID: Lisa Hamilton, female   DOB: 11-05-1945, 67 y.o.   MRN: [id number] Reasonable night. Wis[place] to go home.  Instructions given.

## 2012-09-19 ENCOUNTER — Other Ambulatory Visit: Payer: Self-pay

## 2012-11-26 ENCOUNTER — Other Ambulatory Visit: Payer: Self-pay | Admitting: Dermatology

## 2013-03-10 ENCOUNTER — Other Ambulatory Visit: Payer: Self-pay

## 2013-05-17 ENCOUNTER — Ambulatory Visit: Payer: 59 | Admitting: Pulmonary Disease

## 2013-05-17 ENCOUNTER — Telehealth: Payer: Self-pay | Admitting: Pulmonary Disease

## 2013-05-17 DIAGNOSIS — E559 Vitamin D deficiency, unspecified: Secondary | ICD-10-CM

## 2013-05-17 DIAGNOSIS — K7689 Other specified diseases of liver: Secondary | ICD-10-CM

## 2013-05-17 DIAGNOSIS — E042 Nontoxic multinodular goiter: Secondary | ICD-10-CM

## 2013-05-17 DIAGNOSIS — E7800 Pure hypercholesterolemia, unspecified: Secondary | ICD-10-CM

## 2013-05-17 DIAGNOSIS — I10 Essential (primary) hypertension: Secondary | ICD-10-CM

## 2013-05-17 NOTE — Telephone Encounter (Signed)
Please have SN advise what labs to place if any; pt seen last 05-2012 and no labs done then. Thanks.

## 2013-05-17 NOTE — Telephone Encounter (Signed)
Order written and faxed to [phone] per pt requests. Carron Curie, CMA

## 2013-05-17 NOTE — Telephone Encounter (Signed)
Per SN---  Pt gets her labs done from [facility] and ok for her to have  Lipid BMP Hepatic cbcd tsh Vit d

## 2013-05-20 ENCOUNTER — Other Ambulatory Visit: Payer: Self-pay | Admitting: Gynecology

## 2013-05-25 LAB — HEPATIC FUNCTION PANEL
ALK PHOS: 150 U/L — AB (ref 25–125)
ALT: 17 U/L (ref 7–35)
AST: 22 U/L (ref 13–35)

## 2013-05-25 LAB — TSH: TSH: 3.57 u[IU]/mL (ref 0.41–5.90)

## 2013-05-25 LAB — BASIC METABOLIC PANEL
BUN: 15 mg/dL (ref 4–21)
Creatinine: 0.8 mg/dL (ref 0.5–1.1)
Glucose: 119 mg/dL
Potassium: 3.6 mmol/L (ref 3.4–5.3)
Sodium: 144 mmol/L (ref 137–147)

## 2013-05-25 LAB — CBC AND DIFFERENTIAL
Hemoglobin: 13.3 g/dL (ref 12.0–16.0)
PLATELETS: 316 10*3/uL (ref 150–399)
WBC: 9.5 10*3/mL

## 2013-05-25 LAB — LIPID PANEL
Cholesterol: 220 mg/dL — AB (ref 0–200)
HDL: 48 mg/dL (ref 35–70)
LDL CALC: 144 mg/dL
Triglycerides: 142 mg/dL (ref 40–160)

## 2013-06-02 ENCOUNTER — Ambulatory Visit (INDEPENDENT_AMBULATORY_CARE_PROVIDER_SITE_OTHER)
Admission: RE | Admit: 2013-06-02 | Discharge: 2013-06-02 | Disposition: A | Discharge status: Home / Self Care | Payer: 59 | Source: Ambulatory Visit | Attending: Pulmonary Disease | Admitting: Pulmonary Disease

## 2013-06-02 ENCOUNTER — Encounter: Payer: Self-pay | Admitting: Pulmonary Disease

## 2013-06-02 ENCOUNTER — Ambulatory Visit (INDEPENDENT_AMBULATORY_CARE_PROVIDER_SITE_OTHER): Payer: 59 | Admitting: Pulmonary Disease

## 2013-06-02 VITALS — BP 148/80 | HR 81 | Temp 98.9°F | Ht 61.0 in | Wt 234.8 lb

## 2013-06-02 DIAGNOSIS — Z23 Encounter for immunization: Secondary | ICD-10-CM

## 2013-06-02 DIAGNOSIS — K5730 Diverticulosis of large intestine without perforation or abscess without bleeding: Secondary | ICD-10-CM

## 2013-06-02 DIAGNOSIS — Q615 Medullary cystic kidney: Secondary | ICD-10-CM

## 2013-06-02 DIAGNOSIS — G2581 Restless legs syndrome: Secondary | ICD-10-CM

## 2013-06-02 DIAGNOSIS — K449 Diaphragmatic hernia without obstruction or gangrene: Secondary | ICD-10-CM

## 2013-06-02 DIAGNOSIS — D126 Benign neoplasm of colon, unspecified: Secondary | ICD-10-CM

## 2013-06-02 DIAGNOSIS — M810 Age-related osteoporosis without current pathological fracture: Secondary | ICD-10-CM

## 2013-06-02 DIAGNOSIS — J4489 Other specified chronic obstructive pulmonary disease: Secondary | ICD-10-CM

## 2013-06-02 DIAGNOSIS — J449 Chronic obstructive pulmonary disease, unspecified: Secondary | ICD-10-CM

## 2013-06-02 DIAGNOSIS — I10 Essential (primary) hypertension: Secondary | ICD-10-CM

## 2013-06-02 DIAGNOSIS — Z87442 Personal history of urinary calculi: Secondary | ICD-10-CM

## 2013-06-02 DIAGNOSIS — E042 Nontoxic multinodular goiter: Secondary | ICD-10-CM

## 2013-06-02 DIAGNOSIS — E559 Vitamin D deficiency, unspecified: Secondary | ICD-10-CM

## 2013-06-02 DIAGNOSIS — M1990 Unspecified osteoarthritis, unspecified site: Secondary | ICD-10-CM

## 2013-06-02 DIAGNOSIS — E7800 Pure hypercholesterolemia, unspecified: Secondary | ICD-10-CM

## 2013-06-02 DIAGNOSIS — I872 Venous insufficiency (chronic) (peripheral): Secondary | ICD-10-CM

## 2013-06-02 DIAGNOSIS — E669 Obesity, unspecified: Secondary | ICD-10-CM

## 2013-06-02 MED ORDER — FLUTICASONE-SALMETEROL 500-50 MCG/DOSE IN AEPB: 1.0000 | INHALATION_SPRAY | Freq: Two times a day (BID) | RESPIRATORY_TRACT | Status: DC

## 2013-06-02 MED ORDER — POTASSIUM CHLORIDE CRYS ER 20 MEQ PO TBCR: 20.0000 meq | EXTENDED_RELEASE_TABLET | Freq: Every day | ORAL | Status: DC

## 2013-06-02 MED ORDER — TRAMADOL HCL 50 MG PO TABS: 50.0000 mg | ORAL_TABLET | Freq: Four times a day (QID) | ORAL | Status: DC | PRN

## 2013-06-02 MED ORDER — LOSARTAN POTASSIUM 100 MG PO TABS: 100.0000 mg | ORAL_TABLET | Freq: Every day | ORAL | Status: DC

## 2013-06-02 MED ORDER — ALBUTEROL SULFATE (2.5 MG/3ML) 0.083% IN NEBU: 2.5000 mg | INHALATION_SOLUTION | Freq: Two times a day (BID) | RESPIRATORY_TRACT | Status: DC

## 2013-06-02 MED ORDER — THEOPHYLLINE ER 300 MG PO TB12: 300.0000 mg | ORAL_TABLET | Freq: Two times a day (BID) | ORAL | Status: DC

## 2013-06-02 MED ORDER — ALBUTEROL SULFATE ER 4 MG PO TB12: 4.0000 mg | ORAL_TABLET | Freq: Two times a day (BID) | ORAL | Status: DC

## 2013-06-02 MED ORDER — ALBUTEROL SULFATE HFA 108 (90 BASE) MCG/ACT IN AERS: 2.0000 | INHALATION_SPRAY | Freq: Four times a day (QID) | RESPIRATORY_TRACT | Status: DC | PRN

## 2013-06-02 MED ORDER — RANITIDINE HCL 150 MG PO TABS: 150.0000 mg | ORAL_TABLET | Freq: Every day | ORAL | Status: DC

## 2013-06-02 MED ORDER — ROPINIROLE HCL 0.5 MG PO TABS: ORAL_TABLET | ORAL | Status: DC

## 2013-06-02 NOTE — Progress Notes (Signed)
Subjective:    Patient ID: Lisa Hamilton, female    DOB: 09-28-1945, 67 y.o.   MRN: [id number]  HPI 67 y/o WF here for a follow up visit... she has multiple medical problems as noted below...   ~  Dec09:  she works for [employer] gets all her labs done there... she has had a good year without asthma exas etc... she noted a sl rash on left side of her mid-back x several days- this looks like left T9 shingles & we will Rx accordingly w/ Acyclovir & Medrol Dosepak...  ~  August 31, 2009:  she went to the ER 08/26/09 w/ asthma exac ppt by URI w/ cough, congestion, wheezing, tightness, incr SOB.Marland Kitchen. temp to 101, cough w/ green sputum, CWP from coughing... in the ER>> CXR showed incr markings w/o infiltrate, CBC= OK w/ WBC 5,600 (92% seg), Chem showed K3.3 & BS186... given Avelox, Pred taper, Cough syrup- sl better now... still congested & needs MUCINEX Bid/ Fluids/ etc... she had cut down Advair to 250, and not using the Nebulizer...  ~  September 26, 2009:  improved on the above Rx, back on nebs & Advair500... BP controlled on meds... Chol is OK on Simva40... still needs to get on track w/ diet & exercise program... she donates blood Q8wks at [facility]- CBC looks good... she had Flu shot 10/10, and requests 90d Rxs...  ~  February 25, 2011:  48mo ROV & she reports doing well w/o recent resp exac> denies much cough, sputum, SOB, ch in DOE, edema, etc;  Taking meds regularly & wants 90d refill prescriptions (see below);  She brought labs from [facility]> FLP, CBC, Chems, TSH- all look good;  We checked CXR (elev right hemidiaph, basilar atx, otherw NAD) & EKG (NSR, wnl);  Weight is 224# (up 5#) & despite everything she appears unable to lose weight;  See prob list below>>  ~  May 15, 2012:  46mo ROV & CPX> Lisa Hamilton reports a rough year- she developed pain in her side & eval revealed kidney stones & large liver cysts- she had to have laparoscopic surg 10/12 by DrByerly to unroof the 2 cysts & drained 3L of fluid; then  she had Lithotripsy 11/12 by DrWrenn...    COPD/ AB> on Theo300Bid, Albut4mg Bid, Advair500Bid, Singulair10/d, NEBS w/ Albut; she notes breathing OK, takes meds regularly, no interval exacerbations...    HBP> on Dyazide daily, K20/d> BP= 142/76 & even better on home checks...    CHOL> prev on Simva40 but she stopped on her own due to the liver cysts (?) and never restarted; f/u FLP 10/13 showed TChol 193, TG 121, HDL 44, LDL 125; she would like to stay off meds & work on diet & weight reduction...    Obesity> weight= 227#, BMI~43, & we reviewed diet, exercise, wt reduction strategies...    GI> HH, Divertics, Polyps> on Zantac, & up to date on screening colonoscopy...    Liver Cyst, s/p laparoscopic surg> as above...    Medullary Sponge Kid & stones> as above...    DJD, Osteoporosis> on Tramadol, calcium, MVI, VitD; GYN stopped her Alendronate for a drug holiday...    RLS> she notes incr restless leg symptoms- try Requip 0.5mg  1-2 Qhs... We reviewed prob list, meds, xrays and labs> see below for updates >> she will be 66 next month & wants to get her PNEUMOVAX now- ok; she declines Flu vaccine...  LABS 10/13 at [facility]: FLP- ok x LDL=125;  Chems- wnl x BS=106;  CBC- wnl;  TSH=1.55;  VitD=43   ~  June 02, 2013:  Yearly ROV & despite all efforts Lisa Hamilton has gained 8# up to 235# & BMI=44, we discussed Bariatric surg options... She notes that BP has been elev at home & at [facility] office recently, she relates it to stress (has tension HAs as well), states she's going to retire 12/14 & promises to get on diet & inr exercise (we decided to add Losar100 in the interim)... We reviewed the following medical problems during today's office visit >>     COPD/ AB> on Theo300Bid, Advair500Bid, Singulair10/d, NEBS w/ Albut (we decided to STOP the Albut4mg tabs); she notes breathing OK, takes meds regularly, no interval exacerbations...    HBP> on ASA81, Lasix40, K20/d> BP= 148/80 & we decided to ADD LOSAR100, Creat is wnl  at 0.85; she denies CP, palpit, ch in SOB or edema ...    VI, Edema> switched to Lasix40 & admonished to elim salt, elev legs, wear support hose & get the wt down!    CHOL> prev on Simva40 but she stopped on her own due to the liver cysts (?) and never restarted; FLP 10/14 showed TChol 220, TG 142, HDL 48, LDL 142; she refuses meds & will work on diet & weight reduction...    Obesity> weight= 235#, BMI~44, & we reviewed diet, exercise, wt reduction strategies, asked to consider bariatric approach...    GI> HH, Divertics, Polyps> on Zantac, & up to date on screening colonoscopy...    Liver Cyst, s/p laparoscopic surg> see below...    Medullary Sponge Kid & stones> see below...    DJD, Osteoporosis> on Tramadol, calcium, MVI, VitD, & Neurontin100Tid (per DrAplington); GYN stopped her Alendronate for a drug holiday; she has seen DrHiatt & Podiatry for foot problems & fx 5th metatarsal...    RLS> she notes incr restless leg symptoms- try Requip 0.5mg  1-2 Qhs... We reviewed prob list, meds, xrays and labs> see below for updates >> OK 2014 Flu shot today & requests refills of all meds...  She had her yearly labs done at [facility] 10/14> FLP not at goals on diet alone;  Chems- wnl;  CBC- wnl;  TSH=3.57;  VitD=37...  2DEcho 11/14 showed mild LVH, norm LVF w/ EF=55-60%, Gr1DD, valves OK (mild AoV leaflet thickening & trivAI)...    Problem List:   PHYSICAL EXAMINATION (ICD-V70.0) - see attached pt summary note of her meds, physicians, etc... GYN= DrBowie & she is up-to-date... She gets the yearly Flu vaccine... had Pneumovax 9/02 (& repeat due after age 65)... had Tetanus shot  6/01>> Tdap given today 09/26/09...  GLAUCOMA (ICD-365.9) - s/p glaucoma sury by [facility] in [facility] & followed by DrShapiro w/ surg yrs ago (no drops required & she sees Ophthal yearly). ~  10/13:  She reports that Glaucoma is well controlled, but she has early cataracts discovered... ~  7/14:  She reports bilat cat surg (one week  apart) by DrShapiro  COPD (ICD-496) & ASTHMATIC BRONCHITIS, ACUTE (ICD-466.0) >>  ~  on THEO 300mg Bid,  ALBUTEROL 4mg Bid,  ADVAIR 500/50 Bid,  SINGULAIR 10mg /d,  NEBS w/ Albut Prn... ~  baseline CXR= sl incr markings, NAD... ~  prev CT Chest 4/06 showed mild basilar atx, +multinod goiter, liver cysts, left renal calc, spurs, etc. ~  PFT's 3/08 showed FVC= 1.95 (71%), FEV1= 1.13 (56%), %1sec= 58, mid-flows= 20%pred, +mild air trapping & DLCO/VA= normal... ~  CXR 1/11 showed incr markings, sl elev right hemidiaph, NAD.Marland Kitchen. ~  CXR 7/12  showed elev right hemidiaph, basilar atx, otherw NAD.Marland Kitchen. ~  CXR 10/13 showed stable mild cardiomeg, ectasia of thorAo, elev right hemidiaph, min basilar atx, degen changes in spine... ~  10/14: we decided to stop the Albut tabs due to sudden price incr & he's been stable w/ Theo300Bid, Advair500Bid, Singulair10/d, NEBS w/ Albut...  HYPERTENSION (ICD-401.9) -  ~  on ASA 81mg /d,  DYAZIDE daily + KCl daily... ~  cardiac eval 2006 by DrWall- neg w/ 2DEcho showing mild MR, sm effusion, norm LV w/ EF=55%. ~  10/13: on Dyazide daily, K20/d> BP= 142/76 today & better than this at home checks; denies HA, fatigue, visual changes, CP, palipit, dizziness, syncope, dyspnea, edema, etc...  ~  10/14: on ASA81, Lasix40/d now, K20/d> BP= 148/80 & we decided to ADD LOSAR100, Creat is wnl at 0.85; she denies CP, palpit, ch in SOB or edema  VENOUS INSUFFICIENCY (ICD-459.81) - she knows to avoid sodium, elevate legs, wear support hose. ~  10/14: now on Lasix40 + K20 along w/ her low sodium diet, elevation, support hose, etc...  HYPERCHOLESTEROLEMIA (ICD - prev on Simva40,  Fish Oil 2/d,  CoQ10 daily; she stopped Simva40 on her own 10/12 when she had liver cyst surg. ~  FLP 2/07 off meds showed TChol 246, TG 151, HDL 56, LDL 160... rec- restart Lip10. ~  FLP 7/08 on Lip10 showed TChol 156, TG 96, HDL 56, LDL 81... continue same. ~  FLP 12/09 on Lip10 showed TChol 169, TG 95, HDL 59,  LDL 91... continue same. ~  Insurance Co 11/10 rec change to SIMVASTATIN 40mg /d... ~  FLP 2/11 on Simva40 showed TChol 191, TG 138, HDL 65, LDL 98... continue same. ~  FLP 7/12 on Simva40 showed TChol 179, TG 133, HDL 64, LDL 88 ~  FLP 10/13 on diet alone showed TChol 193, TG 121, HDL 44, LDL 125... She wants to continue diet rx but MUST lose wt. ~  FLP 10/14 at [facility] on diet alone showed TChol 220, TG 142, HDL 48, LDL 142... She refuses meds...  NONTOXIC MULTINODULAR GOITER (ICD-241.1) - CT Chest 4/06 showed several thyroid nodules- largest= 1cm... clinically and biochem euthyroid... ~  labs 3/08 showed TSH= 0.50 ~  labs 12/09 showed TSH= 1.08 ~  labs 2/11 showed TSH= 1.11 ~  Labs 7/12 showed TSH= 0.91 ~  Labs 10/13 showed TSH= 1.55 ~  Labs 10/14 at [facility] showed TSH= 3.57  OBESITY (ICD-278.00) - range 200 - 220 over 10+ yrs. ~  weight 12/09 = 219#, up 5# in the last year... ~  weight 1/11 = 211# ~  Weight 7/12 = 224#... She needs help, suggest wt watchers, etc... ~  Weight 10/13 = 227# ~  Weight 10/14 = 235#  HIATAL HERNIA (ICD-553.3) - on RANITADINE 150mg  Prn... he denies N/ V/ abd pain, etc...  DIVERTICULOSIS OF COLON (ICD-562.10) & COLONIC POLYPS (ICD-211.3) -  ~  colonoscopy 11/05 by DrPatterson showed divertics & 4mm polyp= hyperplastic... f/u planned 52yrs. ~  colonoscopy 12/10 showed 1 cecal polyp= tubular adenoma, f/u planned 78yrs.  Hx of HEPATIC CYST (ICD-573.8) - AbdSonar 4/06 showed mult large liver cysts, all simple cysts, & largest on dome of right lobe measures 11 cm... ~  10/12:  Eval revealed that the cysts had markedly enlarged & she underwent laparoscopic surg by DrByerly to unroof the cysts (marsupilization) & drained 3L of fluid...  Hx of MEDULLARY SPONGE KIDNEY (ICD-753.17) & RENAL CALCULUS, HX OF (ICD-V13.01) - followed by DrWrenn in past..Marland Kitchen  she is asymptomatic & renal function normal... ~  labs 2/11 showed BUN= 18, Creat= 0.66 ~  Labs 7/12 showed abn UA,  BUN= 12, Creat= 0.58 ~  11/12: she had Lithotripsy by DrWrenn for ureteral stone... ~  Labs 10/13 showed BUN= 16, Creat= 0.65 ~  8/14: she had f/u Urology DrWrenn> hx kid stones, s/p ESWL on right 11/12, known 3 stones in LLP, doing satis w/o hematuria or flank pain...  DEGENERATIVE JOINT DISEASE (ICD-715.90) - on TRAMADOL 50mg  Prn for pain... she sees DrSypher & DrAplington in the past- s/p bilat wrist fx: right 1996, left 2001... s/p bilat CTS surg: left 1999, right 2008... ~  10/13:  She is also c/o bilat shoulder pain... ~  1/14: she had left rotator cuff surg & removal of a large lipoma from left shoulder by DrAplington   OSTEOPOROSIS (ICD-733.00) - prev on Alendronate 70mg /wk per DrBowie- he stopped it in 2012 for drug holiday per pt hx... plus Calcium, MVI and Vit D 1000u daily... ~  BMD 3/08 at [facility] showed TScores -2.8 in Spine, -1.5 in left [facility]... ~  f/u BMDs per Gyn DrBowie...  VITAMIN D DEFICIENCY (ICD-268.9) - as above. ~  Labs 10/14 at [facility] showed VitD level = 37  Hx of RESTLESS LEG SYNDROME (ICD-333.94) >> we decided to try REQUIP 0.5mg  1-2 Qhs for her RLS...  LIPOMA (ICD-214.9)  SHINGLES (ICD-053.9) - 12/09 involving left T9 distrib.  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   Past Surgical History  Procedure Laterality Date  . Tonsillectomy  1967  . Glaucoma surgery  1977  . Bilateral wrist fracture, right  1996    left 2001, Dr. Teressa Senter  . Bilateral carpel tunnel repairs      Dr. Simonne Come  left--1999   right--2008  . Squamous cell carcinoma excision  2010  . Laparoscopic liver cyst fenestration    . Hernia repair  1963  . Hernia repair  2012    umb hernia  . Shoulder open rotator cuff repair  08/13/2012    Procedure: ROTATOR CUFF REPAIR SHOULDER OPEN;  Surgeon: Drucilla Schmidt, MD;  Location: [facility];  Service: Orthopedics;  Laterality: Left;  LEFT SHOULDER ANTERIOR  ACROMINECTOMY AND ROTATOR CUFF REPAIR AND EXCISION LEFT SHOULDER LIPOMA  . Lipoma excision  08/13/2012    Procedure: EXCISION LIPOMA;  Surgeon: Drucilla Schmidt, MD;  Location: [facility];  Service: Orthopedics;  Laterality: Left;    Outpatient Encounter Prescriptions as of 06/02/2013  Medication Sig Dispense Refill  . albuterol (PROVENTIL HFA;VENTOLIN HFA) 108 (90 BASE) MCG/ACT inhaler Inhale 2 puffs into the lungs every 6 (six) hours as needed. Wheezing  3 Inhaler  3  . albuterol (PROVENTIL) (2.5 MG/3ML) 0.083% nebulizer solution Take 3 mLs (2.5 mg total) by nebulization 2 (two) times daily.  450 mL  3  . albuterol (VOSPIRE ER) 4 MG 12 hr tablet Take 1 tablet (4 mg total) by mouth every 12 (twelve) hours.  180 tablet  3  . aspirin 81 MG tablet Take 2 tablets by mouth at bedtime      . bisacodyl (DULCOLAX) 5 MG EC tablet Take 5 mg by mouth daily as needed. For constipation      . Calcium Carbonate-Vitamin D 600-400 MG-UNIT per tablet Take 1 tablet by mouth 3 (three) times daily.      . Cholecalciferol (VITAMIN D) 1000 UNITS capsule Take  1,000 Units by mouth daily. VITAMIN D 3      . Co-Enzyme Q-10 100 MG CAPS Take 200 mg by mouth daily.       Marland Kitchen docusate sodium (COLACE) 100 MG capsule Take 200 mg by mouth at bedtime as needed. For constipation      . Fluticasone-Salmeterol (ADVAIR DISKUS) 500-50 MCG/DOSE AEPB Inhale 1 puff into the lungs every 12 (twelve) hours.  3 each  3  . furosemide (LASIX) 40 MG tablet Take 40 mg by mouth every morning.      . gabapentin (NEURONTIN) 300 MG capsule Take 300 mg by mouth 3 (three) times daily.      [place] Lanius Ultra Strength 1500 MG CAPS Take 1,500 mg by mouth daily.      . Magnesium 400 MG CAPS Take 400 mg by mouth daily.      . montelukast (SINGULAIR) 10 MG tablet Take 1 tablet (10 mg total) by mouth at bedtime.  90 tablet  3  . Multiple Vitamin (MULTIVITAMIN) capsule Take 1 capsule by mouth daily. Iron free      . potassium chloride SA (K-DUR,KLOR-CON) 20 MEQ  tablet Take 1 tablet (20 mEq total) by mouth daily.  90 tablet  3  . ranitidine (ZANTAC) 150 MG tablet Take 1 tablet (150 mg total) by mouth at bedtime.  90 tablet  3  . theophylline (THEODUR) 300 MG 12 hr tablet Take 1 tablet (300 mg total) by mouth 2 (two) times daily.  180 tablet  3  . [DISCONTINUED] HYDROcodone-acetaminophen (NORCO) 10-325 MG per tablet Take 1 tablet by mouth every 6 (six) hours as needed for pain.  30 tablet  0  . [DISCONTINUED] ketorolac (TORADOL) 10 MG tablet Take 1 tablet (10 mg total) by mouth every 6 (six) hours as needed for pain.  20 tablet  0  . [DISCONTINUED] methocarbamol (ROBAXIN) 500 MG tablet Take 1 tablet (500 mg total) by mouth 4 (four) times daily.  30 tablet  none   No facility-administered encounter medications on file as of 06/02/2013.    Allergies  Allergen Reactions  . Adhesive [Tape] Other (See Comments)    blisters  . Codeine Other (See Comments)    nightmares  . Amoxicillin-Pot Clavulanate Rash    Current Medications, Allergies, Past Medical History, Past Surgical History, Family History, and Social History were reviewed in [employer] record.   Review of Systems        The patient complains of nasal congestion, dyspnea on exertion, change in bowel habits, indigestion/heartburn, joint pain, stiffness, and anxiety.  The patient denies fever, chills, sweats, anorexia, fatigue, weakness, malaise, weight loss, sleep disorder, blurring, diplopia, eye irritation, eye discharge, vision loss, eye pain, photophobia, earache, ear discharge, tinnitus, decreased hearing, nosebleeds, sore throat, hoarseness, chest pain, palpitations, syncope, orthopnea, PND, peripheral edema, cough, dyspnea at rest, excessive sputum, hemoptysis, wheezing, pleurisy, nausea, vomiting, diarrhea, constipation, abdominal pain, melena, hematochezia, jaundice, gas/bloating, dysphagia, odynophagia, dysuria, hematuria, urinary frequency, urinary hesitancy,  nocturia, incontinence, back pain, joint swelling, muscle cramps, muscle weakness, arthritis, sciatica, restless legs, leg pain at night, leg pain with exertion, rash, itching, dryness, suspicious lesions, paralysis, paresthesias, seizures, tremors, vertigo, transient blindness, frequent falls, frequent headaches, difficulty walking, depression, memory loss, confusion, cold intolerance, heat intolerance, polydipsia, polyphagia, polyuria, unusual weight change, abnormal bruising, bleeding, enlarged lymph nodes, urticaria, allergic rash, hay fever, and recurrent infections.     Objective:   Physical Exam      WD, overweight, 67 y/o  WF in NAD... GENERAL:  Alert & oriented; pleasant & cooperative... HEENT:  [place]/AT, EOM-wnl, PERRLA, EACs-clear, TMs-wnl, NOSE-clear, THROAT-clear & wnl. NECK:  Supple w/ fairROM; no JVD; normal carotid impulses w/o bruits; no thyromegaly or nodules palpated; no lymphadenopathy. CHEST:  Clear to P & A; without wheezes/ rales/ or rhonchi heard... HEART:  Regular Rhythm; without murmurs/ rubs/ or gallops detedted... ABDOMEN:  Soft & nontender; normal bowel sounds; no organomegaly or masses palpated... EXT: without deformities, mild arthritic changes; no varicose veins/ +venous insuffic/ no edema. NEURO:  CN's intact; motor testing normal; sensory testing normal; gait normal & balance OK. DERM:  mild shingles rash on back- left T9 distrib...  RADIOLOGY DATA:  Reviewed in the EPIC EMR & discussed w/ the patient...  LABORATORY DATA:  Reviewed in the EPIC EMR & discussed w/ the patient...   Assessment & Plan:    COPD/ AB>  She has severe disease but very stable on her regular med regimen;  Hasn't required acute Rx or Pred in >24yr & doing satis, continue same...  HBP>  Fair control on meds; unable to lose wt- rec to add LOSAR100 & follow BPs, she understands that we might be able to decr meds if she loses wt...  Ven Insuffic>  Stable, continue low sodium, elevation,  support hose & diuretic Rx...  CHOL>  She stopped Simva40 on her own; FLP is off the mark- refuses meds, needs better diet & wt reduction...  OBESITY>  She hasn't been successful w/ wt reduction program> rec to consider bariatric surg...  THYROID>  Non toxic multinod thyroid ==> stable & euthyroid...  GI> HH, Divertics, Polyps, Hep cyst>  Stable & up to date on screening; s/p hep cyst surg 10/12...  GU> medullary sponge kidney, hx stones>  Stable w/ norm renal function; followed by drWrenn & s/p lithotripsy 11/12...  DJD>  Needs to get wt down, try Tramadol...  Osteoporosis>  BMDs monitored by GYN, DrBowie who has her off the Alendronate for now (drug holiday)...  Other medical issues as noted...   Patient's Medications  New Prescriptions   LOSARTAN (COZAAR) 100 MG TABLET    Take 1 tablet (100 mg total) by mouth daily.   ROPINIROLE (REQUIP) 0.5 MG TABLET    Take 1-2 tablets by mouth at  bedtime   TRAMADOL (ULTRAM) 50 MG TABLET    Take 1 tablet (50 mg total) by mouth every 6 (six) hours as needed.  Previous Medications   ASPIRIN 81 MG TABLET    Take 2 tablets by mouth at bedtime   BISACODYL (DULCOLAX) 5 MG EC TABLET    Take 5 mg by mouth daily as needed. For constipation   CALCIUM CARBONATE-VITAMIN D 600-400 MG-UNIT PER TABLET    Take 1 tablet by mouth 3 (three) times daily.   CHOLECALCIFEROL (VITAMIN D) 1000 UNITS CAPSULE    Take 1,000 Units by mouth daily. VITAMIN D 3   CO-ENZYME Q-10 100 MG CAPS    Take 200 mg by mouth daily.    DOCUSATE SODIUM (COLACE) 100 MG CAPSULE    Take 200 mg by mouth at bedtime as needed. For constipation   GABAPENTIN (NEURONTIN) 300 MG CAPSULE    Take 300 mg by mouth 3 (three) times daily.   KRILL OIL ULTRA STRENGTH 1500 MG CAPS    Take 1,500 mg by mouth daily.   MAGNESIUM 400 MG CAPS    Take 400 mg by mouth daily.   MULTIPLE VITAMIN (MULTIVITAMIN) CAPSULE    Take  1 capsule by mouth daily. Iron free  Modified Medications   Modified Medication Previous  Medication   ALBUTEROL (PROVENTIL HFA;VENTOLIN HFA) 108 (90 BASE) MCG/ACT INHALER albuterol (PROVENTIL HFA;VENTOLIN HFA) 108 (90 BASE) MCG/ACT inhaler      Inhale 2 puffs into the lungs every 6 (six) hours as needed. Wheezing    Inhale 2 puffs into the lungs every 6 (six) hours as needed. Wheezing   ALBUTEROL (PROVENTIL) (2.5 MG/3ML) 0.083% NEBULIZER SOLUTION albuterol (PROVENTIL) (2.5 MG/3ML) 0.083% nebulizer solution      Take 3 mLs (2.5 mg total) by nebulization 2 (two) times daily.    Take 3 mLs (2.5 mg total) by nebulization 2 (two) times daily.   ALBUTEROL (VOSPIRE ER) 4 MG 12 HR TABLET albuterol (VOSPIRE ER) 4 MG 12 hr tablet      Take 1 tablet (4 mg total) by mouth every 12 (twelve) hours.    Take 1 tablet (4 mg total) by mouth every 12 (twelve) hours.   FLUTICASONE-SALMETEROL (ADVAIR DISKUS) 500-50 MCG/DOSE AEPB Fluticasone-Salmeterol (ADVAIR DISKUS) 500-50 MCG/DOSE AEPB      Inhale 1 puff into the lungs every 12 (twelve) hours.    Inhale 1 puff into the lungs every 12 (twelve) hours.   FUROSEMIDE (LASIX) 40 MG TABLET furosemide (LASIX) 40 MG tablet      Take 1 tablet (40 mg total) by mouth every morning.    Take 40 mg by mouth every morning.   MONTELUKAST (SINGULAIR) 10 MG TABLET montelukast (SINGULAIR) 10 MG tablet      Take 1 tablet (10 mg total) by mouth at bedtime.    Take 1 tablet (10 mg total) by mouth at bedtime.   POTASSIUM CHLORIDE SA (K-DUR,KLOR-CON) 20 MEQ TABLET potassium chloride SA (K-DUR,KLOR-CON) 20 MEQ tablet      Take 1 tablet (20 mEq total) by mouth daily.    Take 1 tablet (20 mEq total) by mouth daily.   RANITIDINE (ZANTAC) 150 MG TABLET ranitidine (ZANTAC) 150 MG tablet      Take 1 tablet (150 mg total) by mouth at bedtime.    Take 1 tablet (150 mg total) by mouth at bedtime.   THEOPHYLLINE (THEODUR) 300 MG 12 HR TABLET theophylline (THEODUR) 300 MG 12 hr tablet      Take 1 tablet (300 mg total) by mouth 2 (two) times daily.    Take 1 tablet (300 mg total) by mouth 2  (two) times daily.  Discontinued Medications   HYDROCODONE-ACETAMINOPHEN (NORCO) 10-325 MG PER TABLET    Take 1 tablet by mouth every 6 (six) hours as needed for pain.   KETOROLAC (TORADOL) 10 MG TABLET    Take 1 tablet (10 mg total) by mouth every 6 (six) hours as needed for pain.   METHOCARBAMOL (ROBAXIN) 500 MG TABLET    Take 1 tablet (500 mg total) by mouth 4 (four) times daily.

## 2013-06-02 NOTE — Patient Instructions (Signed)
Today we updated your med list in our EPIC system...    Continue your current medications the same...  We refilled your meds per request & wrote new prescriptions for LOSARTAN 100mg  for BP, REQUIP for the RLS, and TRAMADOL for pain...  Today we did your follow up CXR, & we are awaiting the labs from [facility]...    We will contact you w/ the results when available...   Lisa Hamilton, it is critically important to get the weight down...    Diet & exercise are the keys- you can do it!!!  Call for any questions...  Let's plan a follow up visit in 3-20mo to recheck your BP etc..Marland Kitchen

## 2013-06-03 ENCOUNTER — Ambulatory Visit (INDEPENDENT_AMBULATORY_CARE_PROVIDER_SITE_OTHER): Payer: 59

## 2013-06-03 ENCOUNTER — Ambulatory Visit (INDEPENDENT_AMBULATORY_CARE_PROVIDER_SITE_OTHER): Payer: 59 | Admitting: Podiatry

## 2013-06-03 ENCOUNTER — Ambulatory Visit: Payer: Self-pay | Admitting: Podiatry

## 2013-06-03 ENCOUNTER — Encounter: Payer: Self-pay | Admitting: Podiatry

## 2013-06-03 VITALS — BP 136/65 | HR 103 | Resp 16 | Ht 61.0 in | Wt 229.0 lb

## 2013-06-03 DIAGNOSIS — M79609 Pain in unspecified limb: Secondary | ICD-10-CM

## 2013-06-03 DIAGNOSIS — S92309A Fracture of unspecified metatarsal bone(s), unspecified foot, initial encounter for closed fracture: Secondary | ICD-10-CM

## 2013-06-03 DIAGNOSIS — M79671 Pain in right foot: Secondary | ICD-10-CM

## 2013-06-03 DIAGNOSIS — L851 Acquired keratosis [keratoderma] palmaris et plantaris: Secondary | ICD-10-CM

## 2013-06-03 NOTE — Progress Notes (Signed)
Lisa Hamilton presents today one month after a fracture fifth metatarsal the right foot medially displaced. She's also complaining of porokeratotic lesion to the plantar aspect of the first metatarsal of the left foot.  Objective: Vital signs are stable she is alert and oriented x3. She presents today wearing her cam boot. Pulses remain palpable bilateral lower trimmed the. Radiographic evaluation of the right foot does demonstrate a oblique fracture of the fifth metatarsal metadiaphyseal region. There does appear to be some bone callus formation. Otherwise physical exam of the right foot demonstrates minimal edema no erythema cellulitis drainage or odor. Left foot demonstrates solitary porokeratotic possible verrucoid lesion sub-first metatarsophalangeal joint of the left foot.  Assessment: Fracture fifth metatarsal right foot minimally displaced x1 month. Porokeratotic lesion, soft tissue lesion rule out for [place] plantaris left.  Plan: Continue the use of the cam boot to the right foot. Debridement reactive hyperkeratosis and applied salicylic acid under occlusion to be left for 3 days then to be washed off thoroughly. I will followup with her one month. May consider surgical excision of rheumatoid lesion.

## 2013-06-10 ENCOUNTER — Other Ambulatory Visit: Payer: Self-pay | Admitting: Pulmonary Disease

## 2013-06-10 DIAGNOSIS — I517 Cardiomegaly: Secondary | ICD-10-CM

## 2013-06-14 ENCOUNTER — Other Ambulatory Visit (HOSPITAL_COMMUNITY): Payer: 59

## 2013-06-15 ENCOUNTER — Ambulatory Visit (HOSPITAL_COMMUNITY): Payer: 59 | Attending: Pulmonary Disease

## 2013-06-15 DIAGNOSIS — E785 Hyperlipidemia, unspecified: Secondary | ICD-10-CM | POA: Insufficient documentation

## 2013-06-15 DIAGNOSIS — Z6841 Body Mass Index (BMI) 40.0 and over, adult: Secondary | ICD-10-CM | POA: Insufficient documentation

## 2013-06-15 DIAGNOSIS — J4489 Other specified chronic obstructive pulmonary disease: Secondary | ICD-10-CM | POA: Insufficient documentation

## 2013-06-15 DIAGNOSIS — I10 Essential (primary) hypertension: Secondary | ICD-10-CM | POA: Insufficient documentation

## 2013-06-15 DIAGNOSIS — J449 Chronic obstructive pulmonary disease, unspecified: Secondary | ICD-10-CM | POA: Insufficient documentation

## 2013-06-15 DIAGNOSIS — I059 Rheumatic mitral valve disease, unspecified: Secondary | ICD-10-CM | POA: Insufficient documentation

## 2013-06-15 DIAGNOSIS — E669 Obesity, unspecified: Secondary | ICD-10-CM | POA: Insufficient documentation

## 2013-06-15 DIAGNOSIS — I517 Cardiomegaly: Secondary | ICD-10-CM | POA: Insufficient documentation

## 2013-06-15 NOTE — Progress Notes (Signed)
Echocardiogram performed.  

## 2013-06-28 ENCOUNTER — Telehealth: Payer: Self-pay | Admitting: Pulmonary Disease

## 2013-06-28 MED ORDER — FUROSEMIDE 40 MG PO TABS: 40.0000 mg | ORAL_TABLET | Freq: Every morning | ORAL | Status: DC

## 2013-06-28 MED ORDER — MONTELUKAST SODIUM 10 MG PO TABS: 10.0000 mg | ORAL_TABLET | Freq: Every day | ORAL | Status: DC

## 2013-06-28 NOTE — Telephone Encounter (Signed)
I called and spoke with pt. Aware rx's have been sent. Nothing further needed

## 2013-06-29 ENCOUNTER — Encounter: Payer: Self-pay | Admitting: *Deleted

## 2013-06-30 ENCOUNTER — Ambulatory Visit: Payer: 59 | Admitting: Podiatry

## 2013-07-05 ENCOUNTER — Ambulatory Visit (INDEPENDENT_AMBULATORY_CARE_PROVIDER_SITE_OTHER): Payer: 59

## 2013-07-05 ENCOUNTER — Encounter: Payer: Self-pay | Admitting: Podiatry

## 2013-07-05 ENCOUNTER — Ambulatory Visit (INDEPENDENT_AMBULATORY_CARE_PROVIDER_SITE_OTHER): Payer: 59 | Admitting: Podiatry

## 2013-07-05 VITALS — BP 141/76 | HR 90 | Resp 18 | Ht 61.0 in | Wt 225.0 lb

## 2013-07-05 DIAGNOSIS — R52 Pain, unspecified: Secondary | ICD-10-CM

## 2013-07-05 DIAGNOSIS — B079 Viral wart, unspecified: Secondary | ICD-10-CM

## 2013-07-05 DIAGNOSIS — S92309A Fracture of unspecified metatarsal bone(s), unspecified foot, initial encounter for closed fracture: Secondary | ICD-10-CM

## 2013-07-05 NOTE — Progress Notes (Signed)
Darianny presents today for followup of her fracture fifth metatarsal of the right foot. She states it is doing great. She does have pain to the substance metatarsophalangeal joint left foot where we diagnosed her with a probable wart the last time she was in.  Objective: Vital signs are stable she is alert and oriented x3. She has no erythema edema saline is drainage or odor to the right foot. No pain on palpation fifth metatarsal of the right foot. Radiographic evaluation demonstrates a very nice periosteal reaction to the fractured fifth metatarsal it has been medially translocated but appears to be healing quite nicely. Left foot does demonstrate a solitary verrucoid lesion to the plantar aspect of the first metatarsophalangeal joint left foot.  Assessment: Well-healing fracture right metatarsal fifth. Verruca plantaris left foot sub-first metatarsal.  Plan: I will allow her to get back into her regular shoe gear right foot. The left foot we performed a surgical excision of soft tissue lesion, wart, sub-first metatarsophalangeal joint left. This was performed after 3 cc of a 50-50 mixture of Marcaine plain and lidocaine with epinephrine was injected sub-lesional he today. The area was prepped with its normal Betadine solution. The lesion was then circumscribed with a 15 blade and a curette was used to remove the lesion from the skin. A phenol solution was utilized to cauterize the base Silvadene cream and a Telfa pad was applied under compression. She will start soaking in Betadine and water starting tomorrow and I will followup with her in one week.

## 2013-07-05 NOTE — Patient Instructions (Signed)

## 2013-07-12 ENCOUNTER — Encounter: Payer: Self-pay | Admitting: Podiatry

## 2013-07-12 ENCOUNTER — Ambulatory Visit (INDEPENDENT_AMBULATORY_CARE_PROVIDER_SITE_OTHER): Payer: 59 | Admitting: Podiatry

## 2013-07-12 VITALS — BP 137/79 | HR 89 | Resp 16

## 2013-07-12 DIAGNOSIS — Z9889 Other specified postprocedural states: Secondary | ICD-10-CM

## 2013-07-12 NOTE — Progress Notes (Signed)
Lisa Hamilton presents today for followup of excision wart plantar aspect of the left foot. She denies fever chills nausea vomiting muscle aches or pains. She states this been a little tender.  Objective: Vital signs are stable she is alert and oriented x3. Pulses are palpable left lower extremity. Superficial lesion to the plantar aspect sub-first metatarsophalangeal joint of the right foot demonstrates granulation tissue with epithelialization.  Assessment: Well-healing surgical curettage left foot.  Plan: Discontinue Betadine start with Epsom salts and water soaks cover during the day and leave open at night followup with me with there are any changes tissue going to heal uneventfully. I will notify her should her biopsy come back abnormal.

## 2013-07-13 ENCOUNTER — Encounter: Payer: Self-pay | Admitting: Podiatry

## 2013-07-13 ENCOUNTER — Telehealth: Payer: Self-pay | Admitting: Pulmonary Disease

## 2013-07-13 NOTE — Telephone Encounter (Signed)
I called and spoke with tina from [facility]. Okay per SN. Nothing further needed

## 2013-09-08 ENCOUNTER — Telehealth: Payer: Self-pay | Admitting: Pulmonary Disease

## 2013-09-08 MED ORDER — POTASSIUM CHLORIDE CRYS ER 20 MEQ PO TBCR: 20.0000 meq | EXTENDED_RELEASE_TABLET | Freq: Every day | ORAL | Status: DC

## 2013-09-08 MED ORDER — LOSARTAN POTASSIUM 100 MG PO TABS: 100.0000 mg | ORAL_TABLET | Freq: Every day | ORAL | Status: DC

## 2013-09-08 NOTE — Telephone Encounter (Signed)
Pt is needing refills on potassium and losartan. She is asking it be for 90 days and be mailed to her. Rx printed and signed by Sn and mailed. Pt is aware. Bendon Bing, CMA

## 2013-10-05 ENCOUNTER — Encounter: Payer: Self-pay | Admitting: Pulmonary Disease

## 2013-10-06 ENCOUNTER — Telehealth: Payer: Self-pay | Admitting: Pulmonary Disease

## 2013-10-06 DIAGNOSIS — E559 Vitamin D deficiency, unspecified: Secondary | ICD-10-CM

## 2013-10-06 DIAGNOSIS — M810 Age-related osteoporosis without current pathological fracture: Secondary | ICD-10-CM

## 2013-10-06 DIAGNOSIS — I10 Essential (primary) hypertension: Secondary | ICD-10-CM

## 2013-10-06 DIAGNOSIS — E7800 Pure hypercholesterolemia, unspecified: Secondary | ICD-10-CM

## 2013-10-06 DIAGNOSIS — G2581 Restless legs syndrome: Secondary | ICD-10-CM

## 2013-10-06 MED ORDER — TRAMADOL HCL 50 MG PO TABS: 50.0000 mg | ORAL_TABLET | Freq: Four times a day (QID) | ORAL | Status: DC | PRN

## 2013-10-06 MED ORDER — THEOPHYLLINE ER 300 MG PO TB12: 300.0000 mg | ORAL_TABLET | Freq: Two times a day (BID) | ORAL | Status: DC

## 2013-10-06 MED ORDER — ROPINIROLE HCL 0.5 MG PO TABS: ORAL_TABLET | ORAL | Status: DC

## 2013-10-06 NOTE — Telephone Encounter (Signed)
Called spoke with patient and advised of SN's recs as stated below.  Pt aware that SN will continue to see her for her Asthma and related symptoms.  She will call [facility] to set up appt with Dr Jenny Reichmann (pt's preference).  Tramadol telephoned to [facility] per pt's request and given to pharmacist Renee.    Tramadol 50mg   1tabs q6h prn #90 w/ 1 additional refill as discussed with Leigh Nothing further needed at this time; will sign off.

## 2013-10-06 NOTE — Telephone Encounter (Signed)
Spoke with pt. Advised her that as of April 1, SN will no longer being practicing family medicine. She will need to be set up with new PCP. Order will be placed for referral. She is in need of refills on Requip, Theophylline and Tramadol to [facility]. Requip and Theophylline have been refilled. Advised pt that we would have to ask SN about refilling Tramadol.  SN - please advise on refill. Thanks.

## 2013-10-06 NOTE — Telephone Encounter (Signed)
Per SN---  Ok for the refills.  SN will be able to see her for her asthma (pulmonary issues) but she will need to be set up for new primary care.  thanks

## 2013-10-08 ENCOUNTER — Ambulatory Visit: Payer: 59 | Admitting: Pulmonary Disease

## 2013-11-12 ENCOUNTER — Ambulatory Visit: Payer: 59 | Admitting: Internal Medicine

## 2013-12-15 DIAGNOSIS — Z85828 Personal history of other malignant neoplasm of skin: Secondary | ICD-10-CM | POA: Diagnosis not present

## 2013-12-15 DIAGNOSIS — L821 Other seborrheic keratosis: Secondary | ICD-10-CM | POA: Diagnosis not present

## 2013-12-15 DIAGNOSIS — D2360 Other benign neoplasm of skin of unspecified upper limb, including shoulder: Secondary | ICD-10-CM | POA: Diagnosis not present

## 2013-12-15 DIAGNOSIS — D239 Other benign neoplasm of skin, unspecified: Secondary | ICD-10-CM | POA: Diagnosis not present

## 2013-12-15 DIAGNOSIS — D2320 Other benign neoplasm of skin of unspecified ear and external auricular canal: Secondary | ICD-10-CM | POA: Diagnosis not present

## 2013-12-15 DIAGNOSIS — L570 Actinic keratosis: Secondary | ICD-10-CM | POA: Diagnosis not present

## 2013-12-15 DIAGNOSIS — L820 Inflamed seborrheic keratosis: Secondary | ICD-10-CM | POA: Diagnosis not present

## 2013-12-15 DIAGNOSIS — D2370 Other benign neoplasm of skin of unspecified lower limb, including hip: Secondary | ICD-10-CM | POA: Diagnosis not present

## 2014-01-04 ENCOUNTER — Encounter: Payer: Self-pay | Admitting: Internal Medicine

## 2014-01-04 ENCOUNTER — Ambulatory Visit (INDEPENDENT_AMBULATORY_CARE_PROVIDER_SITE_OTHER): Payer: Medicare Other | Admitting: Internal Medicine

## 2014-01-04 VITALS — BP 132/80 | HR 77 | Temp 98.5°F | Ht 61.0 in | Wt 228.4 lb

## 2014-01-04 DIAGNOSIS — J449 Chronic obstructive pulmonary disease, unspecified: Secondary | ICD-10-CM

## 2014-01-04 DIAGNOSIS — R7309 Other abnormal glucose: Secondary | ICD-10-CM

## 2014-01-04 DIAGNOSIS — R739 Hyperglycemia, unspecified: Secondary | ICD-10-CM | POA: Insufficient documentation

## 2014-01-04 DIAGNOSIS — J4489 Other specified chronic obstructive pulmonary disease: Secondary | ICD-10-CM

## 2014-01-04 DIAGNOSIS — K7689 Other specified diseases of liver: Secondary | ICD-10-CM

## 2014-01-04 DIAGNOSIS — Z23 Encounter for immunization: Secondary | ICD-10-CM | POA: Diagnosis not present

## 2014-01-04 DIAGNOSIS — E7800 Pure hypercholesterolemia, unspecified: Secondary | ICD-10-CM | POA: Diagnosis not present

## 2014-01-04 DIAGNOSIS — M810 Age-related osteoporosis without current pathological fracture: Secondary | ICD-10-CM

## 2014-01-04 DIAGNOSIS — I10 Essential (primary) hypertension: Secondary | ICD-10-CM

## 2014-01-04 MED ORDER — THEOPHYLLINE ER 300 MG PO TB12: 300.0000 mg | ORAL_TABLET | Freq: Two times a day (BID) | ORAL | Status: DC

## 2014-01-04 MED ORDER — POTASSIUM CHLORIDE CRYS ER 20 MEQ PO TBCR: 20.0000 meq | EXTENDED_RELEASE_TABLET | Freq: Every day | ORAL | Status: DC

## 2014-01-04 MED ORDER — FLUTICASONE-SALMETEROL 500-50 MCG/DOSE IN AEPB: 1.0000 | INHALATION_SPRAY | Freq: Two times a day (BID) | RESPIRATORY_TRACT | Status: DC

## 2014-01-04 MED ORDER — ROPINIROLE HCL 0.5 MG PO TABS: ORAL_TABLET | ORAL | Status: DC

## 2014-01-04 MED ORDER — MONTELUKAST SODIUM 10 MG PO TABS: 10.0000 mg | ORAL_TABLET | Freq: Every day | ORAL | Status: DC

## 2014-01-04 MED ORDER — LOSARTAN POTASSIUM 100 MG PO TABS: 100.0000 mg | ORAL_TABLET | Freq: Every day | ORAL | Status: DC

## 2014-01-04 MED ORDER — FUROSEMIDE 40 MG PO TABS: 40.0000 mg | ORAL_TABLET | Freq: Every morning | ORAL | Status: DC

## 2014-01-04 MED ORDER — RANITIDINE HCL 150 MG PO TABS: 150.0000 mg | ORAL_TABLET | Freq: Every day | ORAL | Status: DC

## 2014-01-04 MED ORDER — TRAMADOL HCL 50 MG PO TABS: 50.0000 mg | ORAL_TABLET | Freq: Four times a day (QID) | ORAL | Status: DC | PRN

## 2014-01-04 MED ORDER — GABAPENTIN 300 MG PO CAPS: 300.0000 mg | ORAL_CAPSULE | Freq: Three times a day (TID) | ORAL | Status: DC

## 2014-01-04 NOTE — Patient Instructions (Signed)
It was good to see you today.  We have reviewed your prior records including labs and tests today  Test(s) ordered today - abdominal ultrasound to evaluate liver cysts. Your results will be released to [place] (or called to you) after review, usually within 72hours after test completion. If any changes need to be made, you will be notified at that same time.  Medications reviewed and updated, no changes recommended at this time. Refill on medication(s) as discussed today.  Please schedule followup in November 2015 for annual exam/lab review, call sooner if problems.  Prevnar updated today

## 2014-01-04 NOTE — Assessment & Plan Note (Signed)
Prior history of bilateral wrist fracture following accidental fall/injury 2010 Last DEXA 2013 with improvement (follows with gynecology for same_ Continue calcium and vitamin D. And weightbearing exercises as ongoing

## 2014-01-04 NOTE — Assessment & Plan Note (Signed)
BP Readings from Last 3 Encounters:  01/04/14 132/80  07/12/13 137/79  07/05/13 141/76   The current medical regimen is effective;  continue present plan and medications. The patient is asked to make an attempt to improve diet and exercise patterns to aid in medical management of this problem.

## 2014-01-04 NOTE — Assessment & Plan Note (Signed)
Remote steroid dependent disease due to asthmatic bronchospasm component We'll continue to follow with pulmonary annually for same Symptoms controlled on current regimen, no changes recommended, refills provided today

## 2014-01-04 NOTE — Progress Notes (Signed)
Pre visit review using our clinic review tool, if applicable. No additional management support is needed unless otherwise documented below in the visit note. 

## 2014-01-04 NOTE — Assessment & Plan Note (Signed)
No prior hx DM, no FH DM Reviewed elevated fasting level 05/2013 - Check with a1c at next labs (done at [facility])

## 2014-01-04 NOTE — Progress Notes (Signed)
Subjective:    Patient ID: Lisa Hamilton, female    DOB: 01/13/1946, 68 y.o.   MRN: [id number]  HPI New to me, transefer PCP from [facility] Patient is here for follow up  Reviewed chronic medical issues and interval medical events  Obesity - working on diet and exercise for weight reduction since retirement 07/2013  Asthma/COPD - hx Hamilton steroid dep dz, currently stable on current med regimen - no recent flares, follows with pulm annually for same  HTN. - the patient reports compliance with medication(s) as prescribed. Denies adverse side effects.  Dyslipidemia - prev on statin, stopped 2012 due to liver cyst problem as pt concerned problem caused by statin meds, specifically simva - would consider resuming atorva if a) needed despite diet/exercise and b) liver cysts controlled  Past Medical History  Diagnosis Date  . Glaucoma 1977    EYE SURGERY  FOR TX OF GLAUCOMA--NO LONGER HAS GLAUCOMA  . COPD (chronic obstructive pulmonary disease)   . Asthmatic bronchitis   . Hypertension   . Venous insufficiency   . Hypercholesteremia     NO LONGER A PROBLEM--NO LONGER ON CHOLESTEROL LOWERING MEDS  . Nontoxic multinodular goiter   . Obesity   . Hiatal hernia   . Diverticulosis of colon   . Hx of colonic polyps   . Hepatic cyst     s/p surgical intervention 05/2011 -[place]  . Medullary sponge kidney   . Renal calculus     s/p lithotripsy 06/2011  . DJD (degenerative joint disease)   . Osteoporosis   . Vitamin D deficiency   . Restless leg syndrome   . Lipoma   . Shingles 2009  . Constipation     chronic  . Easy bruising   . Kidney stone   . Seizures     long time ago   Family History  Problem Relation Age of Onset  . Cancer Father     lung  . Cancer Sister     lung   History  Substance Use Topics  . Smoking status: Never Smoker   . Smokeless tobacco: Never Used  . Alcohol Use: No     Comment: social use   Review of Systems  Constitutional: Negative for fatigue  and unexpected weight change.  Respiratory: Negative for cough, shortness of breath and wheezing.   Cardiovascular: Negative for chest pain, palpitations and leg swelling.  Gastrointestinal: Negative for nausea, abdominal pain and diarrhea.  Musculoskeletal: Positive for back pain (chronic, intermittent painn - takes tramadol and gaba for same, works with ortho prn). Negative for gait problem and joint swelling.  Neurological: Negative for dizziness, weakness, light-headedness and headaches.  Psychiatric/Behavioral: Negative for dysphoric mood. The patient is not nervous/anxious.   All other systems reviewed and are negative.      Objective:   Physical Exam  BP 132/80  Pulse 77  Temp(Src) 98.5 F (36.9 C) (Oral)  Ht 5\' 1"  (1.549 m)  Wt 228 lb 6.4 oz (103.602 kg)  BMI 43.18 kg/m2  SpO2 93% Wt Readings from Last 3 Encounters:  01/04/14 228 lb 6.4 oz (103.602 kg)  07/05/13 225 lb (102.059 kg)  06/03/13 229 lb (103.874 kg)   Constitutional: She is MO, but appears well-developed and well-nourished. No distress.  HENT: Head: Normocephalic and atraumatic. Ears: B TMs ok, no erythema or effusion; Nose: Nose normal. Mouth/Throat: Oropharynx is clear and moist. No oropharyngeal exudate.  Eyes: Conjunctivae and EOM are normal. Pupils are equal, round, and reactive to  light. No scleral icterus.  Neck: Thick. Normal range of motion. Neck supple. No JVD present. No thyromegaly present.  Cardiovascular: Normal rate, regular rhythm and normal heart sounds.  No murmur heard. trace dep BLE edema. Pulmonary/Chest: Effort normal and breath sounds normal. No respiratory distress. She has no wheezes.  Abdominal: Soft. Bowel sounds are normal. She exhibits no distension. There is no tenderness. no masses Musculoskeletal: Normal range of motion, no joint effusions. No gross deformities Neurological: She is alert and oriented to person, place, and time. No cranial nerve deficit. Coordination, balance,  strength, speech and gait are normal.  Skin: varicose veins BLE distally. Skin is warm and dry. No rash noted. No erythema.  Psychiatric: She has a normal mood and affect. Her behavior is normal. Judgment and thought content normal.    Lab Results  Component Value Date   WBC 9.5 05/25/2013   HGB 13.3 05/25/2013   HCT 41.6 08/04/2012   PLT 316 05/25/2013   GLUCOSE 82 08/04/2012   CHOL 220* 05/25/2013   TRIG 142 05/25/2013   HDL 48 05/25/2013   LDLCALC 144 05/25/2013   ALT 17 05/25/2013   AST 22 05/25/2013   NA 144 05/25/2013   K 3.6 05/25/2013   CL 99 08/04/2012   CREATININE 0.8 05/25/2013   BUN 15 05/25/2013   CO2 29 08/04/2012   TSH 3.57 05/25/2013   INR 1.10 05/20/2011    Dg Chest 2 View  06/02/2013   CLINICAL DATA:  PHYSICAL EXAMINATION  EXAM: CHEST  2 VIEW  COMPARISON:  05/15/2012  FINDINGS: Cardiomegaly again noted. Stable degenerative changes thoracic spine. Again noted left basilar atelectasis or scarring. No segmental infiltrate or pulmonary edema. Mild degenerative changes lower thoracic spine. Central mild bronchitic changes. Old left rib fracture.  IMPRESSION: Cardiomegaly. Stable left basilar atelectasis or scarring. No acute infiltrate or pulmonary edema. Probable chronic central mild bronchitic changes.   Electronically Signed   By: Lahoma Crocker M.D.   On: 06/02/2013 10:33   Dg Foot Complete Right  06/03/2013   3 views of this right osseously mature foot demonstrates fifth metatarsal  fracture with medial dislocation. There is no comminution but there does  appear to be bone callus formation to the plantar medial aspect of the  fracture site. Minimal dorsal dislocation.      Assessment & Plan:   Problem List Items Addressed This Visit   COPD     Remote steroid dependent disease due to asthmatic bronchospasm component We'll continue to follow with pulmonary annually for same Symptoms controlled on current regimen, no changes recommended, refills provided today     Relevant Medications      guaiFENesin (MUCINEX) 600 MG 12 hr tablet      theophylline CR (THEODUR) 12 hr tablet      montelukast (SINGULAIR) 10 MG tablet      Fluticasone-Salmeterol (ADVAIR DISKUS) 500-50 MCG/DOSE AEPB   HEPATIC CYST     Large cysts 2012, s/p unroofing by surgery 05/2011 Pt concerned with recurrence contributing to breathing symptoms (overlp with asthma) -  will check Abd [place] to follow up on same, esp given potential need to resume atorva for lipids    Relevant Orders      US Abdomen Complete   HYPERCHOLESTEROLEMIA     Previously on atorva, then simva - stopped due to concern for "liver damage" with tx of large hepatic cysts in 2012 Reviewed last labs and reviewed importance of weight reduction with diet and exercise May consider resuming  atorva if needed at next lipid check if abd [place] "ok" - see next    Relevant Medications      losartan (COZAAR) tablet      furosemide (LASIX) tablet   Hyperglycemia     No prior hx DM, no FH DM Reviewed elevated fasting level 05/2013 - Check with a1c at next labs (done at [facility])    HYPERTENSION - Primary      BP Readings from Last 3 Encounters:  01/04/14 132/80  07/12/13 137/79  07/05/13 141/76   The current medical regimen is effective;  continue present plan and medications. The patient is asked to make an attempt to improve diet and exercise patterns to aid in medical management of this problem.     Relevant Medications      losartan (COZAAR) tablet      furosemide (LASIX) tablet   OSTEOPOROSIS     Prior history of bilateral wrist fracture following accidental fall/injury 2010 Last DEXA 2013 with improvement (follows with gynecology for same_ Continue calcium and vitamin D. And weightbearing exercises as ongoing      Time spent with pt today 45 minutes, greater than 50% time spent counseling patient on "borderline" diabetes, dyslipidemia, liver cyst hx and medication review. Also review of prior records

## 2014-01-04 NOTE — Assessment & Plan Note (Signed)
Large cysts 2012, s/p unroofing by surgery 05/2011 Pt concerned with recurrence contributing to breathing symptoms (overlp with asthma) -  will check Abd [place] to follow up on same, esp given potential need to resume atorva for lipids

## 2014-01-04 NOTE — Assessment & Plan Note (Signed)
Previously on atorva, then simva - stopped due to concern for "liver damage" with tx of large hepatic cysts in 2012 Reviewed last labs and reviewed importance of weight reduction with diet and exercise May consider resuming atorva if needed at next lipid check if abd [place] "ok" - see next

## 2014-01-05 ENCOUNTER — Telehealth: Payer: Self-pay | Admitting: Internal Medicine

## 2014-01-05 NOTE — Telephone Encounter (Signed)
Relevant patient education assigned to patient using Emmi. ° °

## 2014-01-12 ENCOUNTER — Ambulatory Visit
Admission: RE | Admit: 2014-01-12 | Discharge: 2014-01-12 | Disposition: A | Discharge status: Home / Self Care | Payer: Medicare Other | Source: Ambulatory Visit | Attending: Internal Medicine | Admitting: Internal Medicine

## 2014-01-12 DIAGNOSIS — K7689 Other specified diseases of liver: Secondary | ICD-10-CM

## 2014-04-06 DIAGNOSIS — N200 Calculus of kidney: Secondary | ICD-10-CM | POA: Diagnosis not present

## 2014-04-06 DIAGNOSIS — R82998 Other abnormal findings in urine: Secondary | ICD-10-CM | POA: Diagnosis not present

## 2014-04-29 DIAGNOSIS — Z1231 Encounter for screening mammogram for malignant neoplasm of breast: Secondary | ICD-10-CM | POA: Diagnosis not present

## 2014-04-29 LAB — HM MAMMOGRAPHY

## 2014-05-05 ENCOUNTER — Encounter: Payer: Self-pay | Admitting: Internal Medicine

## 2014-05-18 ENCOUNTER — Encounter: Payer: Self-pay | Admitting: Internal Medicine

## 2014-05-25 DIAGNOSIS — R7309 Other abnormal glucose: Secondary | ICD-10-CM | POA: Diagnosis not present

## 2014-05-25 DIAGNOSIS — E785 Hyperlipidemia, unspecified: Secondary | ICD-10-CM | POA: Diagnosis not present

## 2014-05-25 DIAGNOSIS — R5383 Other fatigue: Secondary | ICD-10-CM | POA: Diagnosis not present

## 2014-05-25 DIAGNOSIS — Z0000 Encounter for general adult medical examination without abnormal findings: Secondary | ICD-10-CM | POA: Diagnosis not present

## 2014-05-25 DIAGNOSIS — M810 Age-related osteoporosis without current pathological fracture: Secondary | ICD-10-CM | POA: Diagnosis not present

## 2014-05-25 LAB — BASIC METABOLIC PANEL
BUN: 20 mg/dL (ref 4–21)
Creatinine: 0.8 mg/dL (ref 0.5–1.1)
Glucose: 94 mg/dL
Potassium: 4.5 mmol/L (ref 3.4–5.3)
Sodium: 143 mmol/L (ref 137–147)

## 2014-05-25 LAB — CBC AND DIFFERENTIAL
HEMATOCRIT: 42 % (ref 36–46)
HEMOGLOBIN: 13.3 g/dL (ref 12.0–16.0)
PLATELETS: 322 10*3/uL (ref 150–399)
WBC: 6.9 10^3/mL

## 2014-05-25 LAB — HEMOGLOBIN A1C: HEMOGLOBIN A1C: 6 % (ref 4.0–6.0)

## 2014-05-25 LAB — LIPID PANEL
CHOLESTEROL: 210 mg/dL — AB (ref 0–200)
HDL: 44 mg/dL (ref 35–70)
LDL Cholesterol: 141 mg/dL
LDL/HDL RATIO: 3.2
Triglycerides: 123 mg/dL (ref 40–160)

## 2014-05-25 LAB — HEPATIC FUNCTION PANEL
ALT: 14 U/L (ref 7–35)
AST: 21 U/L (ref 13–35)
Alkaline Phosphatase: 147 U/L — AB (ref 25–125)
Bilirubin, Direct: 0.18 mg/dL (ref 0.01–0.4)
Bilirubin, Total: 0.5 mg/dL

## 2014-05-25 LAB — TSH: TSH: 1.6 u[IU]/mL (ref 0.41–5.90)

## 2014-05-26 DIAGNOSIS — M810 Age-related osteoporosis without current pathological fracture: Secondary | ICD-10-CM | POA: Diagnosis not present

## 2014-05-26 DIAGNOSIS — N812 Incomplete uterovaginal prolapse: Secondary | ICD-10-CM | POA: Diagnosis not present

## 2014-05-26 DIAGNOSIS — N951 Menopausal and female climacteric states: Secondary | ICD-10-CM | POA: Diagnosis not present

## 2014-05-26 DIAGNOSIS — Z1212 Encounter for screening for malignant neoplasm of rectum: Secondary | ICD-10-CM | POA: Diagnosis not present

## 2014-06-04 ENCOUNTER — Encounter: Payer: Self-pay | Admitting: Internal Medicine

## 2014-06-07 ENCOUNTER — Encounter: Payer: Self-pay | Admitting: Internal Medicine

## 2014-06-07 ENCOUNTER — Ambulatory Visit (INDEPENDENT_AMBULATORY_CARE_PROVIDER_SITE_OTHER): Payer: Medicare Other | Admitting: Internal Medicine

## 2014-06-07 VITALS — BP 148/84 | HR 74 | Temp 98.2°F | Ht 61.0 in | Wt 194.5 lb

## 2014-06-07 DIAGNOSIS — E669 Obesity, unspecified: Secondary | ICD-10-CM

## 2014-06-07 DIAGNOSIS — Z23 Encounter for immunization: Secondary | ICD-10-CM

## 2014-06-07 DIAGNOSIS — Z0000 Encounter for general adult medical examination without abnormal findings: Secondary | ICD-10-CM

## 2014-06-07 DIAGNOSIS — R739 Hyperglycemia, unspecified: Secondary | ICD-10-CM

## 2014-06-07 DIAGNOSIS — G2581 Restless legs syndrome: Secondary | ICD-10-CM | POA: Diagnosis not present

## 2014-06-07 MED ORDER — TETANUS-DIPHTH-ACELL PERTUSSIS 5-2.5-18.5 LF-MCG/0.5 IM SUSP: 0.5000 mL | Freq: Once | INTRAMUSCULAR | Status: DC

## 2014-06-07 NOTE — Assessment & Plan Note (Signed)
Chronic symptoms, continued sleep disturbance initiating and staying asleep because of need to move. Ineffective relief on Requip and gabapentin therapy - Unable to take oral iron due to generic hemachromatosis  Refer to sleep specialist for further eval and tx

## 2014-06-07 NOTE — Patient Instructions (Addendum)
It was good to see you today.  We have reviewed your prior records including labs and tests today  Health Maintenance reviewed - Tdap UPDATED TODAY -  all OTHER recommended immunizations and age-appropriate screenings are up-to-date.  Test(s)results reviewed today -no changes recommended  we'll make referral to sleep specialist for RLS symptoms. Our office will contact you regarding appointment(s) once made.  Medications reviewed and updated, no changes recommended at this time. Your prescription(s) have been submitted to your pharmacy. Please take as directed and contact our office if you believe you are having problem(s) with the medication(s).  Please schedule followup in 12 months for annual exam and labs, call sooner if problems.  Goal weight for you: 150 lbs. This puts you in category "overweight" at BMI 28.3 or less   Health Maintenance Adopting a healthy lifestyle and getting preventive care can go a long way to promote health and wellness. Talk with your health care provider about what schedule of regular examinations is right for you. This is a good chance for you to check in with your provider about disease prevention and staying healthy. In between checkups, there are plenty of things you can do on your own. Experts have done a lot of research about which lifestyle changes and preventive measures are most likely to keep you healthy. Ask your health care provider for more information. WEIGHT AND DIET  Eat a healthy diet  Be sure to include plenty of vegetables, fruits, low-fat dairy products, and lean protein.  Do not eat a lot of foods high in solid fats, added sugars, or salt.  Get regular exercise. This is one of the most important things you can do for your health.  Most adults should exercise for at least 150 minutes each week. The exercise should increase your heart rate and make you sweat (moderate-intensity exercise).  Most adults should also do strengthening  exercises at least twice a week. This is in addition to the moderate-intensity exercise.  Maintain a healthy weight  Body mass index (BMI) is a measurement that can be used to identify possible weight problems. It estimates body fat based on height and weight. Your health care provider can help determine your BMI and help you achieve or maintain a healthy weight.  For females 24 years of age and older:   A BMI below 18.5 is considered underweight.  A BMI of 18.5 to 24.9 is normal.  A BMI of 25 to 29.9 is considered overweight.  A BMI of 30 and above is considered obese.  Watch levels of cholesterol and blood lipids  You should start having your blood tested for lipids and cholesterol at 67 years of age, then have this test every 5 years.  You may need to have your cholesterol levels checked more often if:  Your lipid or cholesterol levels are high.  You are older than 67 years of age.  You are at high risk for heart disease.  CANCER SCREENING   Lung Cancer  Lung cancer screening is recommended for adults 46-76 years old who are at high risk for lung cancer because of a history of smoking.  A yearly low-dose CT scan of the lungs is recommended for people who:  Currently smoke.  Have quit within the past 15 years.  Have at least a 30-pack-year history of smoking. A pack year is smoking an average of one pack of cigarettes a day for 1 year.  Yearly screening should continue until it has been 15 years  since you quit.  Yearly screening should stop if you develop a health problem that would prevent you from having lung cancer treatment.  Breast Cancer  Practice breast self-awareness. This means understanding how your breasts normally appear and feel.  It also means doing regular breast self-exams. Let your health care provider know about any changes, no matter how small.  If you are in your 20s or 30s, you should have a clinical breast exam (CBE) by a health care  provider every 1-3 years as part of a regular health exam.  If you are 88 or older, have a CBE every year. Also consider having a breast X-ray (mammogram) every year.  If you have a family history of breast cancer, talk to your health care provider about genetic screening.  If you are at high risk for breast cancer, talk to your health care provider about having an MRI and a mammogram every year.  Breast cancer gene (BRCA) assessment is recommended for women who have family members with BRCA-related cancers. BRCA-related cancers include:  Breast.  Ovarian.  Tubal.  Peritoneal cancers.  Results of the assessment will determine the need for genetic counseling and BRCA1 and BRCA2 testing. Cervical Cancer Routine pelvic examinations to screen for cervical cancer are no longer recommended for nonpregnant women who are considered low risk for cancer of the pelvic organs (ovaries, uterus, and vagina) and who do not have symptoms. A pelvic examination may be necessary if you have symptoms including those associated with pelvic infections. Ask your health care provider if a screening pelvic exam is right for you.   The Pap test is the screening test for cervical cancer for women who are considered at risk.  If you had a hysterectomy for a problem that was not cancer or a condition that could lead to cancer, then you no longer need Pap tests.  If you are older than 65 years, and you have had normal Pap tests for the past 10 years, you no longer need to have Pap tests.  If you have had past treatment for cervical cancer or a condition that could lead to cancer, you need Pap tests and screening for cancer for at least 20 years after your treatment.  If you no longer get a Pap test, assess your risk factors if they change (such as having a new sexual partner). This can affect whether you should start being screened again.  Some women have medical problems that increase their chance of getting  cervical cancer. If this is the case for you, your health care provider may recommend more frequent screening and Pap tests.  The human papillomavirus (HPV) test is another test that may be used for cervical cancer screening. The HPV test looks for the virus that can cause cell changes in the cervix. The cells collected during the Pap test can be tested for HPV.  The HPV test can be used to screen women 77 years of age and older. Getting tested for HPV can extend the interval between normal Pap tests from three to five years.  An HPV test also should be used to screen women of any age who have unclear Pap test results.  After 67 years of age, women should have HPV testing as often as Pap tests.  Colorectal Cancer  This type of cancer can be detected and often prevented.  Routine colorectal cancer screening usually begins at 67 years of age and continues through 67 years of age.  Your health care provider  may recommend screening at an earlier age if you have risk factors for colon cancer.  Your health care provider may also recommend using home test kits to check for hidden blood in the stool.  A small camera at the end of a tube can be used to examine your colon directly (sigmoidoscopy or colonoscopy). This is done to check for the earliest forms of colorectal cancer.  Routine screening usually begins at age 55.  Direct examination of the colon should be repeated every 5-10 years through 67 years of age. However, you may need to be screened more often if early forms of precancerous polyps or small growths are found. Skin Cancer  Check your skin from head to toe regularly.  Tell your health care provider about any new moles or changes in moles, especially if there is a change in a mole's shape or color.  Also tell your health care provider if you have a mole that is larger than the size of a pencil eraser.  Always use sunscreen. Apply sunscreen liberally and repeatedly throughout the  day.  Protect yourself by wearing long sleeves, pants, a wide-brimmed hat, and sunglasses whenever you are outside. HEART DISEASE, DIABETES, AND HIGH BLOOD PRESSURE   Have your blood pressure checked at least every 1-2 years. High blood pressure causes heart disease and increases the risk of stroke.  If you are between 66 years and 67 years old, ask your health care provider if you should take aspirin to prevent strokes.  Have regular diabetes screenings. This involves taking a blood sample to check your fasting blood sugar level.  If you are at a normal weight and have a low risk for diabetes, have this test once every three years after 67 years of age.  If you are overweight and have a high risk for diabetes, consider being tested at a younger age or more often. PREVENTING INFECTION  Hepatitis B  If you have a higher risk for hepatitis B, you should be screened for this virus. You are considered at high risk for hepatitis B if:  You were born in a country where hepatitis B is common. Ask your health care provider which countries are considered high risk.  Your parents were born in a high-risk country, and you have not been immunized against hepatitis B (hepatitis B vaccine).  You have HIV or AIDS.  You use needles to inject street drugs.  You live with someone who has hepatitis B.  You have had sex with someone who has hepatitis B.  You get hemodialysis treatment.  You take certain medicines for conditions, including cancer, organ transplantation, and autoimmune conditions. Hepatitis C  Blood testing is recommended for:  Everyone born from [birth year] through 1964.  Anyone with known risk factors for hepatitis C. Sexually transmitted infections (STIs)  You should be screened for sexually transmitted infections (STIs) including gonorrhea and chlamydia if:  You are sexually active and are younger than 67 years of age.  You are older than 67 years of age and your health care  provider tells you that you are at risk for this type of infection.  Your sexual activity has changed since you were last screened and you are at an increased risk for chlamydia or gonorrhea. Ask your health care provider if you are at risk.  If you do not have HIV, but are at risk, it may be recommended that you take a prescription medicine daily to prevent HIV infection. This is called pre-exposure prophylaxis (PrEP).  You are considered at risk if:  You are sexually active and do not regularly use condoms or know the HIV status of your partner(s).  You take drugs by injection.  You are sexually active with a partner who has HIV. Talk with your health care provider about whether you are at high risk of being infected with HIV. If you choose to begin PrEP, you should first be tested for HIV. You should then be tested every 3 months for as long as you are taking PrEP.  PREGNANCY   If you are premenopausal and you may become pregnant, ask your health care provider about preconception counseling.  If you may become pregnant, take 400 to 800 micrograms (mcg) of folic acid every day.  If you want to prevent pregnancy, talk to your health care provider about birth control (contraception). OSTEOPOROSIS AND MENOPAUSE   Osteoporosis is a disease in which the bones lose minerals and strength with aging. This can result in serious bone fractures. Your risk for osteoporosis can be identified using a bone density scan.  If you are 19 years of age or older, or if you are at risk for osteoporosis and fractures, ask your health care provider if you should be screened.  Ask your health care provider whether you should take a calcium or vitamin D supplement to lower your risk for osteoporosis.  Menopause may have certain physical symptoms and risks.  Hormone replacement therapy may reduce some of these symptoms and risks. Talk to your health care provider about whether hormone replacement therapy is  right for you.  HOME CARE INSTRUCTIONS   Schedule regular health, dental, and eye exams.  Stay current with your immunizations.   Do not use any tobacco products including cigarettes, chewing tobacco, or electronic cigarettes.  If you are pregnant, do not drink alcohol.  If you are breastfeeding, limit how much and how often you drink alcohol.  Limit alcohol intake to no more than 1 drink per day for nonpregnant women. One drink equals 12 ounces of beer, 5 ounces of wine, or 1 ounces of hard liquor.  Do not use street drugs.  Do not share needles.  Ask your health care provider for help if you need support or information about quitting drugs.  Tell your health care provider if you often feel depressed.  Tell your health care provider if you have ever been abused or do not feel safe at home. Document Released: 02/04/2011 Document Revised: 12/06/2013 Document Reviewed: 06/23/2013 [facility] Patient Information 2015 [place], [place]. This information is not intended to replace advice given to you by your health care provider. Make sure you discuss any questions you have with your health care provider.

## 2014-06-07 NOTE — Progress Notes (Signed)
Subjective:    Patient ID: Lisa Hamilton, female    DOB: July 24, 1946, 68 y.o.   MRN: [id number]  HPI   Here for annual wellness  Diet: heart healthy  Physical activity: sedentary Depression/mood screen: negative Hearing: intact to whispered voice Visual acuity: grossly normal, performs annual eye exam  ADLs: capable Fall risk: none Home safety: good Cognitive evaluation: intact to orientation, naming, recall and repetition EOL planning: adv directives, full code/ I agree  I have personally reviewed and have noted 1. The patient's medical and social history 2. Their use of alcohol, tobacco or illicit drugs 3. Their current medications and supplements 4. The patient's functional ability including ADL's, fall risks, home safety risks and hearing or visual impairment. 5. Diet and physical activities 6. Evidence for depression or mood disorders  Also reviewed chronic medical issues and interval medical events  Past Medical History  Diagnosis Date  . Glaucoma 1977    EYE SURGERY  FOR TX OF GLAUCOMA--NO LONGER HAS GLAUCOMA  . COPD (chronic obstructive pulmonary disease)   . Asthmatic bronchitis   . Hypertension   . Venous insufficiency   . Hypercholesteremia     NO LONGER A PROBLEM--NO LONGER ON CHOLESTEROL LOWERING MEDS  . Nontoxic multinodular goiter   . Obesity   . Hiatal hernia   . Diverticulosis of colon   . Hx of colonic polyps   . Hepatic cyst     s/p surgical intervention 05/2011 -[facility]  . Medullary sponge kidney   . Renal calculus     s/p lithotripsy 06/2011  . DJD (degenerative joint disease)   . Osteoporosis   . Vitamin D deficiency   . Restless leg syndrome   . Lipoma   . Shingles 2009  . Constipation     chronic  . Kidney stone   . Seizures     long time ago   Family History  Problem Relation Age of Onset  . Cancer Father     lung  . Cancer Sister     lung   History  Substance Use Topics  . Smoking status: Never Smoker   . Smokeless  tobacco: Never Used  . Alcohol Use: No     Comment: social use    Review of Systems  Constitutional: Negative for fatigue and unexpected weight change.  Respiratory: Negative for cough, shortness of breath and wheezing.   Cardiovascular: Negative for chest pain, palpitations and leg swelling.  Gastrointestinal: Negative for nausea, abdominal pain and diarrhea.  Neurological: Negative for dizziness, weakness, light-headedness and headaches.  Psychiatric/Behavioral: Positive for sleep disturbance (RLS problems, hard to get to sleep AND to stay asleep). Negative for dysphoric mood. The patient is not nervous/anxious.   All other systems reviewed and are negative.      Objective:   Physical Exam  BP 148/84 mmHg  Pulse 74  Temp(Src) 98.2 F (36.8 C) (Oral)  Ht 5\' 1"  (1.549 m)  Wt 194 lb 8 oz (88.225 kg)  BMI 36.77 kg/m2  SpO2 94% Wt Readings from Last 3 Encounters:  06/07/14 194 lb 8 oz (88.225 kg)  01/04/14 228 lb 6.4 oz (103.602 kg)  07/05/13 225 lb (102.059 kg)   Constitutional: She is obese, appears well-developed and well-nourished. No distress.  HENT: Head: Normocephalic and atraumatic. Ears: B TMs ok, no erythema or effusion; Nose: Nose normal. Mouth/Throat: Oropharynx is clear and moist. No oropharyngeal exudate.  Eyes: Conjunctivae and EOM are normal. Pupils are equal, round, and reactive to light. No  scleral icterus.  Neck: Normal range of motion. Neck supple. No JVD present. No thyromegaly present.  Cardiovascular: Normal rate, regular rhythm and normal heart sounds.  No murmur heard. No BLE edema. Pulmonary/Chest: Effort normal and breath sounds normal. No respiratory distress. She has no wheezes.  Abdominal: Soft. Bowel sounds are normal. She exhibits no distension. There is no tenderness. no masses GU/breast: defer to gyn Musculoskeletal: Normal range of motion, no joint effusions. No gross deformities Neurological: She is alert and oriented to person, place, and  time. No cranial nerve deficit. Coordination, balance, strength, speech and gait are normal.  Skin: Skin is warm and dry. No rash noted. No erythema.  Psychiatric: She has a normal mood and affect. Her behavior is normal. Judgment and thought content normal.    Lab Results  Component Value Date   WBC 6.9 05/25/2014   HGB 13.3 05/25/2014   HCT 42 05/25/2014   PLT 322 05/25/2014   GLUCOSE 82 08/04/2012   CHOL 210* 05/25/2014   TRIG 123 05/25/2014   HDL 44 05/25/2014   LDLCALC 141 05/25/2014   ALT 14 05/25/2014   AST 21 05/25/2014   NA 143 05/25/2014   K 4.5 05/25/2014   CL 99 08/04/2012   CREATININE 0.8 05/25/2014   BUN 20 05/25/2014   CO2 29 08/04/2012   TSH 1.60 05/25/2014   INR 1.10 05/20/2011   HGBA1C 6.0 05/25/2014    US Abdomen Complete  01/12/2014   CLINICAL DATA:  Abdominal pain, history of liver cysts post liver cyst drainage in 2012, hypertension, COPD, hypercholesterolemia, medullary sponge kidney, kidney stones, hiatal hernia  EXAM: ULTRASOUND ABDOMEN COMPLETE  COMPARISON:  11/16/2004  FINDINGS: Gallbladder:  Normally distended without stones or wall thickening.  No pericholecystic fluid or sonographic Murphy sign.  Common bile duct:  Diameter: Normal caliber 5 mm diameter  Liver:  Normal parenchymal echogenicity. Liver elongated, 24.4 cm length. Multiple cysts largest 13.0 x 14.2 x 16.0 cm in RIGHT lobe containing scattered low level internal echoes. Minimal dependent debris, inconsistently visualized. No definite mural nodularity.  IVC:  Normal appearance  Pancreas:  Obscured by bowel gas.  Spleen:  Normal appearance, 9.8 cm length  Right Kidney:  Length: 11.6 cm.  Normal morphology without mass or hydronephrosis.  Left Kidney:  Length: 12.4 cm.  Normal morphology without mass or hydronephrosis.  Abdominal aorta:  Distally obscured by bowel gas, visualized portions normal caliber  Other findings:  No free fluid  IMPRESSION: Multiple hepatic cysts with largest in RIGHT lobe  13.0 x 14.2 x 16.0 cm (on previous exam largest measured 10.7 x 9.9 x 11.3 cm).  Nonvisualization of pancreas and distal abdominal aorta.   Electronically Signed   By: Lavonia Dana M.D.   On: 01/12/2014 09:13       Assessment & Plan:   CPX/AWV/z00.00 - Today patient counseled on age appropriate routine health concerns for screening and prevention, each reviewed and up to date or declined. Immunizations reviewed and up to date or declined. Labs reviewed. Risk factors for depression reviewed and negative. Hearing function and visual acuity are intact. ADLs screened and addressed as needed. Functional ability and level of safety reviewed and appropriate. Education, counseling and referrals performed based on assessed risks today. Patient provided with a copy of personalized plan for preventive services.  Problem List Items Addressed This Visit    Hyperglycemia    Reviewed prior hx "borderline" DM, no FH DM Reviewed elevated fasting level 05/2013 (119) - Improving with weight  loss efforts ongoing - continue same  Lab Results  Component Value Date   HGBA1C 6.0 05/25/2014       Obesity    Continued weight loss through conscious effort, enrolled with [employer]' program Discussed goals = ideally BMI <25, but can allow goal weight 150# -  Encouragement provided on efforts and success to date    RESTLESS LEG SYNDROME    Chronic symptoms, continued sleep disturbance initiating and staying asleep because of need to move. Ineffective relief on Requip and gabapentin therapy - Unable to take oral iron due to generic hemachromatosis  Refer to sleep specialist for further eval and tx     Other Visit Diagnoses    Routine general medical examination at a health care facility    -  Primary    Need for prophylactic vaccination and inoculation against influenza        Relevant Orders       Flu Vaccine QUAD 36+ mos PF IM (Fluarix Quad PF) (Completed)

## 2014-06-07 NOTE — Assessment & Plan Note (Signed)
Continued weight loss through conscious effort, enrolled with [employer]' program Discussed goals = ideally BMI <25, but can allow goal weight 150# -  Encouragement provided on efforts and success to date

## 2014-06-07 NOTE — Assessment & Plan Note (Signed)
Reviewed prior hx "borderline" DM, no FH DM Reviewed elevated fasting level 05/2013 (119) - Improving with weight loss efforts ongoing - continue same  Lab Results  Component Value Date   HGBA1C 6.0 05/25/2014

## 2014-06-07 NOTE — Progress Notes (Signed)
Pre visit review using our clinic review tool, if applicable. No additional management support is needed unless otherwise documented below in the visit note. 

## 2014-06-17 ENCOUNTER — Telehealth: Payer: Self-pay | Admitting: Internal Medicine

## 2014-06-17 ENCOUNTER — Encounter: Payer: Self-pay | Admitting: Internal Medicine

## 2014-06-17 NOTE — Telephone Encounter (Signed)
Pt wanted to talk with stefannie would not tell for what reason   Best number -[phone]

## 2014-06-20 ENCOUNTER — Telehealth: Payer: Self-pay | Admitting: Internal Medicine

## 2014-06-20 ENCOUNTER — Other Ambulatory Visit: Payer: Self-pay

## 2014-06-20 MED ORDER — FLUTICASONE-SALMETEROL 500-50 MCG/DOSE IN AEPB: 1.0000 | INHALATION_SPRAY | Freq: Two times a day (BID) | RESPIRATORY_TRACT | Status: DC

## 2014-06-20 NOTE — Telephone Encounter (Signed)
Pt called in said that all her refills need to be sent to [facility].  She needs those to be sent she is out of Fluticasone-Salmeterol (ADVAIR DISKUS) 500-50 MCG/DOSE AEPB [[id number]] .  Also she need her lab orders to have her labs done at [facility] mailed to her.

## 2014-06-20 NOTE — Telephone Encounter (Signed)
Spoke to pt and informed that rx for advair is sent to [facility].  Confirmed that she did need written order for labs to be collected and completed at [place].   Pt informed that on 12/22 sleep consultation will be done.

## 2014-06-21 NOTE — Telephone Encounter (Signed)
All her CPX labs were done at [facility] on 05/25/14 - What other labs is patient needing to have repeated? thanks

## 2014-06-21 NOTE — Telephone Encounter (Signed)
Pt wanted to have the written lab orders for next years labs.

## 2014-06-22 NOTE — Telephone Encounter (Signed)
Ok to generate written rx for all same labs as done this year (copy scanned into EHR labs) I will sign when I return Code: z00.00 thanks

## 2014-07-04 NOTE — Telephone Encounter (Signed)
Letter script sent to pt address on file.

## 2014-07-25 ENCOUNTER — Institutional Professional Consult (permissible substitution): Payer: Medicare Other | Admitting: Pulmonary Disease

## 2014-07-26 ENCOUNTER — Ambulatory Visit (INDEPENDENT_AMBULATORY_CARE_PROVIDER_SITE_OTHER): Payer: Medicare Other | Admitting: Internal Medicine

## 2014-07-26 ENCOUNTER — Encounter: Payer: Self-pay | Admitting: Internal Medicine

## 2014-07-26 VITALS — BP 140/84 | HR 95 | Ht 61.0 in | Wt 196.2 lb

## 2014-07-26 DIAGNOSIS — G2581 Restless legs syndrome: Secondary | ICD-10-CM

## 2014-07-26 MED ORDER — CLONAZEPAM 0.5 MG PO TABS: ORAL_TABLET | ORAL | Status: DC

## 2014-07-26 NOTE — Progress Notes (Signed)
07/26/14- 68 yoF   referred by Dr. Asa Lente; legs jump and jerk at night waking her up and she has back pain that keeps her up at night; Epworth Score: 10 Clinical diagnosis of restless leg syndrome made several years ago and treated with Requip. She is now taking Requip 1 g at 6:30 PM and gabapentin 300 mg at 4 PM and 600 mg at bedtime. This has gradually become less effective. She is tired during the day. Arms are starting to twitch some now. She does not describe sleepwalking, punching or other complex behaviors. Her husband sleeps in a separate room to avoid her activity. She is worse if she is tired. History of low back pain and she has lost 2 inches in height. She is trying to lose weight with [employer] and is down about 40 pounds, hoping this will help. Not aware of snoring. Sister and mother have similar limb jerks problems. No history of anemia.  Prior to Admission medications   Medication Sig Start Date End Date Taking? Authorizing Provider  albuterol (PROVENTIL HFA;VENTOLIN HFA) 108 (90 BASE) MCG/ACT inhaler Inhale 2 puffs into the lungs every 6 (six) hours as needed. Wheezing 06/02/13  Yes Noralee Space, MD  albuterol (PROVENTIL) (2.5 MG/3ML) 0.083% nebulizer solution Take 3 mLs (2.5 mg total) by nebulization 2 (two) times daily. 06/02/13  Yes Noralee Space, MD  aspirin 81 MG tablet Take 2 tablets by mouth at bedtime   Yes Historical Provider, MD  bisacodyl (DULCOLAX) 5 MG EC tablet Take 5 mg by mouth daily as needed. For constipation   Yes Historical Provider, MD  Calcium Carbonate-Vitamin D 600-400 MG-UNIT per tablet Take 1 tablet by mouth 3 (three) times daily.   Yes Historical Provider, MD  Cholecalciferol (VITAMIN D) 1000 UNITS capsule Take 1,000 Units by mouth daily. VITAMIN D 3   Yes Historical Provider, MD  Co-Enzyme Q-10 100 MG CAPS Take 200 mg by mouth daily.    Yes Historical Provider, MD  docusate sodium (COLACE) 100 MG capsule Take 200 mg by mouth at bedtime as needed.  For constipation   Yes Historical Provider, MD  Fluticasone-Salmeterol (ADVAIR DISKUS) 500-50 MCG/DOSE AEPB Inhale 1 puff into the lungs every 12 (twelve) hours. 06/20/14  Yes Rowe Clack, MD  furosemide (LASIX) 40 MG tablet Take 1 tablet (40 mg total) by mouth every morning. 01/04/14  Yes Rowe Clack, MD  gabapentin (NEURONTIN) 300 MG capsule Take 1 capsule (300 mg total) by mouth 3 (three) times daily. 01/04/14  Yes Rowe Clack, MD  guaiFENesin (MUCINEX) 600 MG 12 hr tablet Take 600 mg by mouth 2 (two) times daily as needed.    Yes Historical Provider, MD  ibuprofen (ADVIL,MOTRIN) 200 MG tablet Take 200 mg by mouth every 6 (six) hours as needed.   Yes Historical Provider, MD  Astrid Drafts Ultra Strength 1500 MG CAPS Take 1,500 mg by mouth daily.   Yes Historical Provider, MD  losartan (COZAAR) 100 MG tablet Take 1 tablet (100 mg total) by mouth daily. 01/04/14  Yes Rowe Clack, MD  Magnesium 400 MG CAPS Take 400 mg by mouth daily.   Yes Historical Provider, MD  montelukast (SINGULAIR) 10 MG tablet Take 1 tablet (10 mg total) by mouth at bedtime. 01/04/14  Yes Rowe Clack, MD  Multiple Vitamin (MULTIVITAMIN) capsule Take 1 capsule by mouth daily. Iron free   Yes Historical Provider, MD  potassium chloride SA (K-DUR,KLOR-CON) 20 MEQ tablet Take 1 tablet (20  mEq total) by mouth daily. 01/04/14  Yes Rowe Clack, MD  Probiotic Product (PROBIOTIC DAILY PO) Take 1 tablet by mouth 2 (two) times daily.   Yes Historical Provider, MD  ranitidine (ZANTAC) 150 MG tablet Take 1 tablet (150 mg total) by mouth at bedtime. Patient taking differently: Take 150 mg by mouth as needed.  01/04/14  Yes Rowe Clack, MD  rOPINIRole (REQUIP) 0.5 MG tablet Take 1-2 tablets by mouth at  bedtime 01/04/14  Yes Rowe Clack, MD  theophylline (THEODUR) 300 MG 12 hr tablet Take 1 tablet (300 mg total) by mouth 2 (two) times daily. 01/04/14  Yes Rowe Clack, MD  traMADol (ULTRAM) 50 MG  tablet Take 1 tablet (50 mg total) by mouth every 6 (six) hours as needed for moderate pain or severe pain. 01/04/14  Yes Rowe Clack, MD  clonazePAM Bobbye [place]) 0.5 MG tablet 1-3 taken 30 minutes before bed 07/26/14   Deneise Lever, MD   Past Medical History  Diagnosis Date  . Glaucoma 1977    EYE SURGERY  FOR TX OF GLAUCOMA--NO LONGER HAS GLAUCOMA  . COPD (chronic obstructive pulmonary disease)   . Asthmatic bronchitis   . Hypertension   . Venous insufficiency   . Hypercholesteremia     NO LONGER A PROBLEM--NO LONGER ON CHOLESTEROL LOWERING MEDS  . Nontoxic multinodular goiter   . Obesity   . Hiatal hernia   . Diverticulosis of colon   . Hx of colonic polyps   . Hepatic cyst     s/p surgical intervention 05/2011 -[facility]  . Medullary sponge kidney   . Renal calculus     s/p lithotripsy 06/2011  . DJD (degenerative joint disease)   . Osteoporosis   . Vitamin D deficiency   . Restless leg syndrome   . Lipoma   . Shingles 2009  . Constipation     chronic  . Kidney stone   . Seizures     long time ago   Past Surgical History  Procedure Laterality Date  . Tonsillectomy  1967  . Glaucoma surgery  1977  . Bilateral wrist fracture, right  1996    left 2001, Dr. Daylene Katayama  . Bilateral carpel tunnel repairs      Dr. Shellia Carwin  left--1999   right--2008  . Squamous cell carcinoma excision  2010    [facility]  . Laparoscopic liver cyst fenestration  05/2011  . Hernia repair  1963  . Hernia repair  2012    umb hernia  . Shoulder open rotator cuff repair  08/13/2012    Procedure: ROTATOR CUFF REPAIR SHOULDER OPEN;  Surgeon: Magnus Sinning, MD;  Location: [facility];  Service: Orthopedics;  Laterality: Left;  LEFT SHOULDER ANTERIOR ACROMINECTOMY AND ROTATOR CUFF REPAIR AND EXCISION LEFT SHOULDER LIPOMA  . Lipoma excision  08/13/2012    Procedure: EXCISION LIPOMA;  Surgeon: Magnus Sinning, MD;  Location: [facility];  Service: Orthopedics;  Laterality: Left;  . Cataract extraction   04/07/13 R, 05/18/13 L    [facility]   Family History  Problem Relation Age of Onset  . Cancer Father     lung  . Cancer Sister     lung   History   Social History  . Marital Status: Married    Spouse Name: Fritz Pickerel    Number of Children: 2  . Years of Education: N/A   Occupational History  . Dance movement psychotherapist for [employer]  .  Smoking status: Never Smoker   . Smokeless tobacco: Never Used  . Alcohol Use: No     Comment: social use  . Drug Use: No  . Sexual Activity: Not on file   Other Topics Concern  . Not on file   Social History Narrative   ROS-see HPI Constitutional:   No-   weight loss, night sweats, fevers, chills, +fatigue, lassitude. HEENT:   No-  headaches, difficulty swallowing, tooth/dental problems, sore throat,       No-  sneezing, itching, ear ache, nasal congestion, post nasal drip,  CV:  No-   chest pain, orthopnea, PND, swelling in lower extremities, anasarca,                                  dizziness, palpitations Resp: No-   shortness of breath with exertion or at rest.              No-   productive cough,  No non-productive cough,  No- coughing up of blood.              No-   change in color of mucus.  No- wheezing.   Skin: No-   rash or lesions. GI:  No-   heartburn, indigestion, abdominal pain, nausea, vomiting, diarrhea,                 change in bowel habits, loss of appetite GU: No-   dysuria, change in color of urine, no urgency or frequency.  No- flank pain. MS:  No-   joint pain or swelling.  No- decreased range of motion.  No- back pain. Neuro-     + per HPI Psych:  No- change in mood or affect. No depression or anxiety.  No memory loss.  OBJ- Physical Exam General- Alert, Oriented, Affect-appropriate, Distress- none acute, obese Skin- rash-none, lesions- none, excoriation- none Lymphadenopathy- none Head- atraumatic            Eyes- Gross vision intact, PERRLA, conjunctivae and secretions clear             Ears- Hearing, canals-normal            Nose- Clear, no-Septal dev, mucus, polyps, erosion, perforation             Throat- Mallampati II-III , mucosa clear , drainage- none, tonsils- atrophic Neck- flexible , trachea midline, no stridor , thyroid nl, carotid no bruit Chest - symmetrical excursion , unlabored           Heart/CV- RRR , no murmur , no gallop  , no rub, nl s1 s2                           - JVD- none , edema- none, stasis changes- none, varices- none           Lung- clear to P&A, wheeze- none, cough- none , dullness-none, rub- none           Chest wall-  Abd- tender-no, distended-no, bowel sounds-present, HSM- no Br/ Gen/ Rectal- Not done, not indicated Extrem- cyanosis- none, clubbing, none, atrophy- none, strength- nl Neuro- grossly intact to observation, no unusual restlessness or movement

## 2014-07-26 NOTE — Patient Instructions (Signed)
Script to try adding clonazepam for your leg jerks as directed.  Please call as needed

## 2014-07-26 NOTE — Assessment & Plan Note (Signed)
Clinical impression of Restless Leg Syndrome seems appropriate. She does not have iron deficiency anemia. Degenerative disc disease and nerve root irritation in the lumbosacral spine may be aggravating but there is also a family history. I don't suspect sleep disordered breathing. At this point a sleep study probably would not change anything although that can be reconsidered later. Plan-try clonazepam. If this helps we may be able to wean away some of her other medication.

## 2014-09-23 ENCOUNTER — Encounter: Payer: Self-pay | Admitting: Gastroenterology

## 2014-09-28 ENCOUNTER — Encounter: Payer: Self-pay | Admitting: Internal Medicine

## 2014-09-28 ENCOUNTER — Ambulatory Visit (INDEPENDENT_AMBULATORY_CARE_PROVIDER_SITE_OTHER): Payer: Medicare Other | Admitting: Internal Medicine

## 2014-09-28 VITALS — BP 130/66 | HR 85 | Ht 61.0 in | Wt 195.4 lb

## 2014-09-28 DIAGNOSIS — G2581 Restless legs syndrome: Secondary | ICD-10-CM

## 2014-09-28 DIAGNOSIS — I872 Venous insufficiency (chronic) (peripheral): Secondary | ICD-10-CM | POA: Diagnosis not present

## 2014-09-28 MED ORDER — CLONAZEPAM 0.5 MG PO TABS: ORAL_TABLET | ORAL | Status: DC

## 2014-09-28 NOTE — Progress Notes (Signed)
07/26/14- 68 yoF   referred by Dr. Asa Lente; legs jump and jerk at night waking her up and she has back pain that keeps her up at night; Epworth Score: 10 Clinical diagnosis of restless leg syndrome made several years ago and treated with Requip. She is now taking Requip 1 g at 6:30 PM and gabapentin 300 mg at 4 PM and 600 mg at bedtime. This has gradually become less effective. She is tired during the day. Arms are starting to twitch some now. She does not describe sleepwalking, punching or other complex behaviors. Her husband sleeps in a separate room to avoid her activity. She is worse if she is tired. History of low back pain and she has lost 2 inches in height. She is trying to lose weight with Weight [employer] and is down about 40 pounds, hoping this will help. Not aware of snoring. Sister and mother have similar limb jerks problems. No history of anemia.  09/28/14-68 yoF followed for Restless Legs and she has back pain that keeps her up at night; complicated by peripheral venous disease, hemochromatosis FOLLOWS FOR: Pt states the night time is better with her leg cramps; if she is up on her legs alot during the day she has noticed her legs swell up and cause more leg cramps at night. Pt is currently on diuretic. Hgb was 13.3, K 4.5 in October. Sleeping much better using  clonazepam 0.5 mg plus Requip. She asks if she could take correctional doses occasionally on long car rides and we discussed avoidance of stagnation of blood flow and drowsiness. She would not be driving. She gives blood every 8 weeks because of history of hemochromatosis, has not been iron deficient. Describes left posterior leg pain consistent with sciatica ROS-see HPI Constitutional:   No-   weight loss, night sweats, fevers, chills, +fatigue, lassitude. HEENT:   No-  headaches, difficulty swallowing, tooth/dental problems, sore throat,       No-  sneezing, itching, ear ache, nasal congestion, post nasal drip,  CV:  No-   chest  pain, orthopnea, PND, swelling in lower extremities, anasarca,                                  dizziness, palpitations Resp: No-   shortness of breath with exertion or at rest.              No-   productive cough,  No non-productive cough,  No- coughing up of blood.              No-   change in color of mucus.  No- wheezing.   Skin: No-   rash or lesions. GI:  No-   heartburn, indigestion, abdominal pain, nausea, vomiting,  GU: No-    MS:  No-   joint pain or swelling.  No- decreased range of motion.  + back pain. Neuro-     + per HPI Psych:  No- change in mood or affect. No depression or anxiety.  No memory loss.  OBJ- Physical Exam General- Alert, Oriented, Affect-appropriate, Distress- none acute, obese Skin- rash-none, lesions- none, excoriation- none Lymphadenopathy- none Head- atraumatic            Eyes- Gross vision intact, PERRLA, conjunctivae and secretions clear            Ears- Hearing, canals-normal            Nose- Clear, no-Septal dev,  mucus, polyps, erosion, perforation             Throat- Mallampati II-III , mucosa clear , drainage- none, tonsils- atrophic Neck- flexible , trachea midline, no stridor , thyroid nl, carotid no bruit Chest - symmetrical excursion , unlabored           Heart/CV- RRR , no murmur , no gallop  , no rub, nl s1 s2                           - JVD- none , edema- none, stasis changes- none, varices- none           Lung- clear to P&A, wheeze- none, cough- none , dullness-none, rub- none           Chest wall-  Abd-  Br/ Gen/ Rectal- Not done, not indicated Extrem- cyanosis- none, clubbing, none, atrophy- none, strength- nl,  soft superficial varices to thigh Neuro- grossly intact to observation, no unusual restlessness or movement

## 2014-09-28 NOTE — Patient Instructions (Addendum)
Ok to continue clonazepam and requip for the restless less as you have been doing. Script printed for the clonazepam as discussed.  We discussed use of low doses of these meds if necessary for long drives if needed. Be very careful about oversedation- you don't want to fall or have a car accident.  Avoid prolonged sitting- move your legs around a lot when you are sitting to prevent blood clots.  Consider wearing support hose if you are going to be standing or sitting a lot.  Please call if we can help

## 2014-09-28 NOTE — Assessment & Plan Note (Addendum)
Dependent venous congestion may contribute to her leg discomfort with standing and it puts her at increased risk for DVT as discussed Plan-recommend support hose and emphasis on frequent stretching and walking to prevent venous stagnation.

## 2014-09-28 NOTE — Assessment & Plan Note (Signed)
Plan-recommended support hose, avoid stasis by moving legs frequently, especially if sitting for prolonged intervals. Continue clonazepam and Requip

## 2014-11-03 ENCOUNTER — Ambulatory Visit (AMBULATORY_SURGERY_CENTER): Payer: Self-pay | Admitting: *Deleted

## 2014-11-03 VITALS — Ht 61.0 in | Wt 195.0 lb

## 2014-11-03 DIAGNOSIS — Z86010 Personal history of colonic polyps: Secondary | ICD-10-CM

## 2014-11-03 MED ORDER — MOVIPREP 100 G PO SOLR: ORAL | Status: DC

## 2014-11-03 NOTE — Progress Notes (Signed)
No egg or soy allergy  No anesthesia or intubation problems per pt  No diet medications taken  Pt takes stool softeners and Benefiber daily and states this keep her regular with her BMs

## 2014-11-10 DIAGNOSIS — Z961 Presence of intraocular lens: Secondary | ICD-10-CM | POA: Diagnosis not present

## 2014-11-21 DIAGNOSIS — H35371 Puckering of macula, right eye: Secondary | ICD-10-CM | POA: Diagnosis not present

## 2014-11-21 DIAGNOSIS — H35361 Drusen (degenerative) of macula, right eye: Secondary | ICD-10-CM | POA: Diagnosis not present

## 2014-11-21 DIAGNOSIS — H35372 Puckering of macula, left eye: Secondary | ICD-10-CM | POA: Diagnosis not present

## 2014-11-21 DIAGNOSIS — H4010X0 Unspecified open-angle glaucoma, stage unspecified: Secondary | ICD-10-CM | POA: Diagnosis not present

## 2014-11-21 DIAGNOSIS — H3531 Nonexudative age-related macular degeneration: Secondary | ICD-10-CM | POA: Diagnosis not present

## 2014-11-21 DIAGNOSIS — Z961 Presence of intraocular lens: Secondary | ICD-10-CM | POA: Diagnosis not present

## 2014-11-22 DIAGNOSIS — H35372 Puckering of macula, left eye: Secondary | ICD-10-CM | POA: Diagnosis not present

## 2014-11-22 HISTORY — PX: OTHER SURGICAL HISTORY: SHX169

## 2014-11-29 ENCOUNTER — Encounter: Payer: Self-pay | Admitting: Gastroenterology

## 2014-11-29 ENCOUNTER — Ambulatory Visit (AMBULATORY_SURGERY_CENTER): Payer: Medicare Other | Admitting: Gastroenterology

## 2014-11-29 VITALS — BP 146/77 | HR 71 | Temp 97.6°F | Resp 14 | Ht 61.0 in | Wt 195.0 lb

## 2014-11-29 DIAGNOSIS — I872 Venous insufficiency (chronic) (peripheral): Secondary | ICD-10-CM | POA: Diagnosis not present

## 2014-11-29 DIAGNOSIS — D123 Benign neoplasm of transverse colon: Secondary | ICD-10-CM

## 2014-11-29 DIAGNOSIS — Z86010 Personal history of colonic polyps: Secondary | ICD-10-CM | POA: Diagnosis present

## 2014-11-29 DIAGNOSIS — Z1211 Encounter for screening for malignant neoplasm of colon: Secondary | ICD-10-CM | POA: Diagnosis not present

## 2014-11-29 MED ORDER — SODIUM CHLORIDE 0.9 % IV SOLN: 500.0000 mL | INTRAVENOUS | Status: DC

## 2014-11-29 NOTE — Progress Notes (Signed)
Patient awakening,vss,report to rn 

## 2014-11-29 NOTE — Progress Notes (Signed)
Called to room to assist during endoscopic procedure.  Patient ID and intended procedure confirmed with present staff. Received instructions for my participation in the procedure from the performing physician.  

## 2014-11-29 NOTE — Patient Instructions (Signed)
YOU HAD AN ENDOSCOPIC PROCEDURE TODAY AT THE  ENDOSCOPY CENTER:   Refer to the procedure report that was given to you for any specific questions about what was found during the examination.  If the procedure report does not answer your questions, please call your gastroenterologist to clarify.  If you requested that your care partner not be given the details of your procedure findings, then the procedure report has been included in a sealed envelope for you to review at your convenience later.  YOU SHOULD EXPECT: Some feelings of bloating in the abdomen. Passage of more gas than usual.  Walking can help get rid of the air that was put into your GI tract during the procedure and reduce the bloating. If you had a lower endoscopy (such as a colonoscopy or flexible sigmoidoscopy) you may notice spotting of blood in your stool or on the toilet paper. If you underwent a bowel prep for your procedure, you may not have a normal bowel movement for a few days.  Please Note:  You might notice some irritation and congestion in your nose or some drainage.  This is from the oxygen used during your procedure.  There is no need for concern and it should clear up in a day or so.  SYMPTOMS TO REPORT IMMEDIATELY:   Following lower endoscopy (colonoscopy or flexible sigmoidoscopy):  Excessive amounts of blood in the stool  Significant tenderness or worsening of abdominal pains  Swelling of the abdomen that is new, acute  Fever of 100F or higher    For urgent or emergent issues, a gastroenterologist can be reached at any hour by calling [phone].   DIET: Your first meal following the procedure should be a small meal and then it is ok to progress to your normal diet. Heavy or fried foods are harder to digest and may make you feel nauseous or bloated.  Likewise, meals heavy in dairy and vegetables can increase bloating.  Drink plenty of fluids but you should avoid alcoholic beverages for 24  hours.  ACTIVITY:  You should plan to take it easy for the rest of today and you should NOT DRIVE or use heavy machinery until tomorrow (because of the sedation medicines used during the test).    FOLLOW UP: Our staff will call the number listed on your records the next business day following your procedure to check on you and address any questions or concerns that you may have regarding the information given to you following your procedure. If we do not reach you, we will leave a message.  However, if you are feeling well and you are not experiencing any problems, there is no need to return our call.  We will assume that you have returned to your regular daily activities without incident.  If any biopsies were taken you will be contacted by phone or by letter within the next 1-3 weeks.  Please call us at [phone] if you have not heard about the biopsies in 3 weeks.    SIGNATURES/CONFIDENTIALITY: You and/or your care partner have signed paperwork which will be entered into your electronic medical record.  These signatures attest to the fact that that the information above on your After Visit Summary has been reviewed and is understood.  Full responsibility of the confidentiality of this discharge information lies with you and/or your care-partner.   Resume medications. Information given on polyps, diverticulosis and high fiber diet with discharge instructions. 

## 2014-11-29 NOTE — Op Note (Signed)
[facility]  [address] [place], [id number]   COLONOSCOPY PROCEDURE REPORT  PATIENT: Lisa Hamilton, Lisa Hamilton  MR#: [id number] BIRTHDATE: 07-18-1946 , 7  yrs. old GENDER: female ENDOSCOPIST: Milus Banister, MD PROCEDURE DATE:  11/29/2014 PROCEDURE:   Colonoscopy, surveillance and Colonoscopy with snare polypectomy First Screening Colonoscopy - Avg.  risk and is 50 yrs.  old or older - No.  Prior Negative Screening - Now for repeat screening. N/A  History of Adenoma - Now for follow-up colonoscopy & has been > or = to 3 yrs.  Yes hx of adenoma.  Has been 3 or more years since last colonoscopy. ASA CLASS:   Class II INDICATIONS:Surveillance due to prior colonic neoplasia (multiple adenomatous polyps removed Dr. Sharlett Iles over many years). MEDICATIONS: Monitored anesthesia care and Propofol 250 mg IV  DESCRIPTION OF PROCEDURE:   After the risks benefits and alternatives of the procedure were thoroughly explained, informed consent was obtained.  The digital rectal exam revealed no abnormalities of the rectum.   The LB [id number]  endoscope was introduced through the anus and advanced to the cecum, which was identified by both the appendix and ileocecal valve. No adverse events experienced.   The quality of the prep was excellent.  The instrument was then slowly withdrawn as the colon was fully examined.   COLON FINDINGS: Three sessile polyps were found in the transverse colon.  One was 92mm, sessile, removed with snare cautery. The other 2 were 2-1mm, sessile, removed with cold snare.  The resection was complete, the polyp tissue was completely retrieved and sent to histology.   There was mild diverticulosis noted throughout the entire examined colon.   The examination was otherwise normal.  Retroflexed views revealed no abnormalities. The time to cecum = 2.8 Withdrawal time = 8.5   The scope was withdrawn and the procedure completed. COMPLICATIONS: There were no  immediate complications.  ENDOSCOPIC IMPRESSION: 1.   Three sessile polyps were found in the transverse colon; all removed and sent to pathology. 2.   Mild diverticulosis was noted throughout the entire examined colon 3.   The examination was otherwise normal  RECOMMENDATIONS: If the polyp(s) removed today are proven to be adenomatous (pre-cancerous) polyps, you will need a colonoscopy in 3-5 years. Otherwise you should continue to follow colorectal cancer screening guidelines for "routine risk" patients with a colonoscopy in 10 years.  You will receive a letter within 1-2 weeks with the results of your biopsy as well as final recommendations.  Please call my office if you have not received a letter after 3 weeks.  eSigned:  Milus Banister, MD 11/29/2014 8:23 AM   cc: Gwendolyn Grant, MD   PATIENT NAME:  [name], [name] MR#: [id number]

## 2014-11-30 ENCOUNTER — Telehealth: Payer: Self-pay | Admitting: *Deleted

## 2014-11-30 NOTE — Telephone Encounter (Signed)
  Follow up Call-  Call back number 11/29/2014  Post procedure Call Back phone  # [phone]  Permission to leave phone message Yes     Patient questions:  Do you have a fever, pain , or abdominal swelling? No. Pain Score  0 *  Have you tolerated food without any problems? Yes.    Have you been able to return to your normal activities? Yes.    Do you have any questions about your discharge instructions: Diet   No. Medications  No. Follow up visit  No.  Do you have questions or concerns about your Care? No.  Actions: * If pain score is 4 or above: No action needed, pain <4.

## 2014-12-13 ENCOUNTER — Encounter: Payer: Self-pay | Admitting: Gastroenterology

## 2015-01-27 ENCOUNTER — Other Ambulatory Visit: Payer: Self-pay | Admitting: Internal Medicine

## 2015-02-03 DIAGNOSIS — L821 Other seborrheic keratosis: Secondary | ICD-10-CM | POA: Diagnosis not present

## 2015-02-03 DIAGNOSIS — L570 Actinic keratosis: Secondary | ICD-10-CM | POA: Diagnosis not present

## 2015-02-03 DIAGNOSIS — D225 Melanocytic nevi of trunk: Secondary | ICD-10-CM | POA: Diagnosis not present

## 2015-02-03 DIAGNOSIS — Z85828 Personal history of other malignant neoplasm of skin: Secondary | ICD-10-CM | POA: Diagnosis not present

## 2015-02-03 DIAGNOSIS — I8392 Asymptomatic varicose veins of left lower extremity: Secondary | ICD-10-CM | POA: Diagnosis not present

## 2015-02-03 DIAGNOSIS — D2221 Melanocytic nevi of right ear and external auricular canal: Secondary | ICD-10-CM | POA: Diagnosis not present

## 2015-02-03 DIAGNOSIS — D2371 Other benign neoplasm of skin of right lower limb, including hip: Secondary | ICD-10-CM | POA: Diagnosis not present

## 2015-02-03 DIAGNOSIS — I8391 Asymptomatic varicose veins of right lower extremity: Secondary | ICD-10-CM | POA: Diagnosis not present

## 2015-02-13 ENCOUNTER — Other Ambulatory Visit: Payer: Self-pay

## 2015-02-13 MED ORDER — TRAMADOL HCL 50 MG PO TABS: 50.0000 mg | ORAL_TABLET | Freq: Four times a day (QID) | ORAL | Status: DC | PRN

## 2015-02-14 ENCOUNTER — Other Ambulatory Visit: Payer: Self-pay

## 2015-02-14 MED ORDER — TRAMADOL HCL 50 MG PO TABS: 50.0000 mg | ORAL_TABLET | Freq: Four times a day (QID) | ORAL | Status: DC | PRN

## 2015-03-22 ENCOUNTER — Other Ambulatory Visit: Payer: Self-pay | Admitting: Internal Medicine

## 2015-03-29 ENCOUNTER — Ambulatory Visit (INDEPENDENT_AMBULATORY_CARE_PROVIDER_SITE_OTHER): Payer: Medicare Other | Admitting: Internal Medicine

## 2015-03-29 ENCOUNTER — Encounter: Payer: Self-pay | Admitting: Internal Medicine

## 2015-03-29 VITALS — BP 146/82 | HR 77 | Ht 61.0 in | Wt 207.0 lb

## 2015-03-29 DIAGNOSIS — I872 Venous insufficiency (chronic) (peripheral): Secondary | ICD-10-CM | POA: Diagnosis not present

## 2015-03-29 DIAGNOSIS — G2581 Restless legs syndrome: Secondary | ICD-10-CM | POA: Diagnosis not present

## 2015-03-29 DIAGNOSIS — J449 Chronic obstructive pulmonary disease, unspecified: Secondary | ICD-10-CM

## 2015-03-29 MED ORDER — ROTIGOTINE 1 MG/24HR TD PT24: MEDICATED_PATCH | TRANSDERMAL | Status: DC

## 2015-03-29 MED ORDER — TEMAZEPAM 15 MG PO CAPS: ORAL_CAPSULE | ORAL | Status: DC

## 2015-03-29 MED ORDER — UMECLIDINIUM-VILANTEROL 62.5-25 MCG/INH IN AEPB: INHALATION_SPRAY | RESPIRATORY_TRACT | Status: DC

## 2015-03-29 NOTE — Assessment & Plan Note (Signed)
We discussed changing from clonazepam to a benzodiazepine with shorter half-life (temazepam). She can also try taking Requip earlier in the day, or try a continuous patch (Neupro).

## 2015-03-29 NOTE — Progress Notes (Signed)
07/26/14- 68 yoF   referred by Dr. Asa Lente; legs jump and jerk at night waking her up and she has back pain that keeps her up at night; Epworth Score: 10 Clinical diagnosis of restless leg syndrome made several years ago and treated with Requip. She is now taking Requip 1 g at 6:30 PM and gabapentin 300 mg at 4 PM and 600 mg at bedtime. This has gradually become less effective. She is tired during the day. Arms are starting to twitch some now. She does not describe sleepwalking, punching or other complex behaviors. Her husband sleeps in a separate room to avoid her activity. She is worse if she is tired. History of low back pain and she has lost 2 inches in height. She is trying to lose weight with [employer] and is down about 40 pounds, hoping this will help. Not aware of snoring. Sister and mother have similar limb jerks problems. No history of anemia.  09/28/14-68 yoF followed for Restless Legs and she has back pain that keeps her up at night; complicated by peripheral venous disease, hemochromatosis FOLLOWS FOR: Pt states the night time is better with her leg cramps; if she is up on her legs alot during the day she has noticed her legs swell up and cause more leg cramps at night. Pt is currently on diuretic. Hgb was 13.3, K 4.5 in October. Sleeping much better using  clonazepam 0.5 mg plus Requip. She asks if she could take correctional doses occasionally on long car rides and we discussed avoidance of stagnation of blood flow and drowsiness. She would not be driving. She gives blood every 8 weeks because of history of hemochromatosis, has not been iron deficient. Describes left posterior leg pain consistent with sciatica   03/28/15-    68 yoF followed for Restless Legs and she has back pain that keeps her up at night; complicated by peripheral venous disease, hemochromatosis FOLLOWS FOR: Pt states she needs to take a half clonazepam d/t drowsiness. Pt states the leg cramps have improved but she  is still kicking out during the night that is waking her. Pt states the restlessness is starting sooner in the day.  Restless legs getting worse and bothering her more now earlier in the day. Clonazepam is lasting too long. Asthma worse with heat and whether. Using rescue inhaler more. Using her nebulizer before bedtime and repeating at 3 AM.              ROS-see HPI Constitutional:   No-   weight loss, night sweats, fevers, chills, +fatigue, lassitude. HEENT:   No-  headaches, difficulty swallowing, tooth/dental problems, sore throat,       No-  sneezing, itching, ear ache, nasal congestion, post nasal drip,  CV:  No-   chest pain, orthopnea, PND, swelling in lower extremities, anasarca,                                                       dizziness, palpitations Resp: No-   shortness of breath with exertion or at rest.              No-   productive cough,  No non-productive cough,  No- coughing up of blood.              No-   change in color  of mucus. + wheezing.   Skin: No-   rash or lesions. GI:  No-   heartburn, indigestion, abdominal pain, nausea, vomiting,  GU:     MS:  No-   joint pain or swelling.  No- decreased range of motion.  + back pain. Neuro-     + per HPI Psych:  No- change in mood or affect. No depression or anxiety.  No memory loss.  OBJ- Physical Exam General- Alert, Oriented, Affect-appropriate, Distress- none acute, +obese Skin- rash-none, lesions- none, excoriation- none Lymphadenopathy- none Head- atraumatic            Eyes- Gross vision intact, PERRLA, conjunctivae and secretions clear            Ears- Hearing, canals-normal            Nose- Clear, no-Septal dev, mucus, polyps, erosion, perforation             Throat- Mallampati II-III , mucosa clear , drainage- none, tonsils- atrophic Neck- flexible , trachea midline, no stridor , thyroid nl, carotid no bruit Chest - symmetrical excursion , unlabored           Heart/CV- RRR , no murmur , no gallop  , no rub,  nl s1 s2                           - JVD- none , edema- none, stasis changes- none, varices- none           Lung- clear to P&A, wheeze- none, cough- none , dullness-none, rub- none           Chest wall-  Abd-  Br/ Gen/ Rectal- Not done, not indicated Extrem- cyanosis- none, clubbing, none, atrophy- none, strength- nl,  soft superficial varices to thigh Neuro- grossly intact to observation, no unusual restlessness or movement

## 2015-03-29 NOTE — Assessment & Plan Note (Signed)
More chest tightness and dyspnea without a lot of wheeze. It is waking her at night and increased use of bronchodilators might be contributing to her increased restless leg syndrome. Plan-try sample Anoro for comparison with discussion

## 2015-03-29 NOTE — Patient Instructions (Addendum)
Script for temazepam- try this instead for clonazepam to see if it helps, with less morning hangover  Sample and script for Anoro Ellipta inhaler   1 inhalation, once daily. See if this gives you better asthma control through day and night than Advair   Script printed for Neupro 1 mg patch to try instead of Requip        OR   Try taking your current Requip tabs  1 in morning, 1 at lunch or early afternoon, and 1 or 2 for bedtime  Please call as needed

## 2015-03-29 NOTE — Assessment & Plan Note (Signed)
Varicose veins. These may contribute to her restless leg syndrome

## 2015-04-03 ENCOUNTER — Other Ambulatory Visit: Payer: Self-pay | Admitting: Internal Medicine

## 2015-04-12 DIAGNOSIS — N200 Calculus of kidney: Secondary | ICD-10-CM | POA: Diagnosis not present

## 2015-04-13 ENCOUNTER — Telehealth: Payer: Self-pay | Admitting: Internal Medicine

## 2015-04-13 MED ORDER — ROPINIROLE HCL 0.5 MG PO TABS: ORAL_TABLET | ORAL | Status: DC

## 2015-04-13 NOTE — Telephone Encounter (Signed)
Ok to rewrite her current requip strength, #360, 4 daily as directed, refill x 3

## 2015-04-13 NOTE — Telephone Encounter (Signed)
Per 03/29/15 OV: Patient Instructions       Script for temazepam- try this instead for clonazepam to see if it helps, with less morning hangover Sample and script for Anoro Ellipta inhaler   1 inhalation, once daily. See if this gives you better asthma control through day and night than Advair Script printed for Neupro 1 mg patch to try instead of Requip       OR   Try taking your current Requip tabs  1 in morning, 1 at lunch or early afternoon, and 1 or 2 for bedtime Please call as needed  ---  Called spoke with pt. Her insurance would not pay for Neupro. She has tried taking the requip 1 qAM, 1 at lunch and 2 at bedtime and feels this is working well for her. She wants an updated RX for requip stating those directions and for a 90 day supply. Please advise Dr. Annamaria Boots thanks

## 2015-04-13 NOTE — Telephone Encounter (Signed)
Called pt. RX [facility]. Nothing further needed

## 2015-05-05 DIAGNOSIS — Z1231 Encounter for screening mammogram for malignant neoplasm of breast: Secondary | ICD-10-CM | POA: Diagnosis not present

## 2015-05-05 LAB — HM MAMMOGRAPHY

## 2015-05-10 ENCOUNTER — Encounter: Payer: Self-pay | Admitting: Internal Medicine

## 2015-05-11 DIAGNOSIS — H401132 Primary open-angle glaucoma, bilateral, moderate stage: Secondary | ICD-10-CM | POA: Diagnosis not present

## 2015-05-11 DIAGNOSIS — H2640 Unspecified secondary cataract: Secondary | ICD-10-CM | POA: Diagnosis not present

## 2015-05-11 DIAGNOSIS — Z961 Presence of intraocular lens: Secondary | ICD-10-CM | POA: Diagnosis not present

## 2015-05-11 DIAGNOSIS — H35371 Puckering of macula, right eye: Secondary | ICD-10-CM | POA: Diagnosis not present

## 2015-05-11 DIAGNOSIS — H35372 Puckering of macula, left eye: Secondary | ICD-10-CM | POA: Diagnosis not present

## 2015-05-11 DIAGNOSIS — H353131 Nonexudative age-related macular degeneration, bilateral, early dry stage: Secondary | ICD-10-CM | POA: Diagnosis not present

## 2015-05-15 ENCOUNTER — Encounter: Payer: Self-pay | Admitting: Gastroenterology

## 2015-05-16 ENCOUNTER — Encounter: Payer: Self-pay | Admitting: Gastroenterology

## 2015-05-23 ENCOUNTER — Encounter: Payer: Self-pay | Admitting: *Deleted

## 2015-05-29 ENCOUNTER — Other Ambulatory Visit: Payer: Self-pay | Admitting: Internal Medicine

## 2015-05-30 DIAGNOSIS — R5383 Other fatigue: Secondary | ICD-10-CM | POA: Diagnosis not present

## 2015-05-30 DIAGNOSIS — E785 Hyperlipidemia, unspecified: Secondary | ICD-10-CM | POA: Diagnosis not present

## 2015-05-30 DIAGNOSIS — Z0000 Encounter for general adult medical examination without abnormal findings: Secondary | ICD-10-CM | POA: Diagnosis not present

## 2015-05-30 DIAGNOSIS — R739 Hyperglycemia, unspecified: Secondary | ICD-10-CM | POA: Diagnosis not present

## 2015-05-30 LAB — HEPATIC FUNCTION PANEL
ALK PHOS: 123 U/L (ref 25–125)
ALT: 16 U/L (ref 7–35)
AST: 11 U/L — AB (ref 13–35)
Bilirubin, Direct: 0.12 mg/dL (ref 0.01–0.4)
Bilirubin, Total: 0.3 mg/dL

## 2015-05-30 LAB — BASIC METABOLIC PANEL
Creatinine: 0.6 mg/dL (ref 0.5–1.1)
GLUCOSE: 110 mg/dL
Potassium: 3.7 mmol/L (ref 3.4–5.3)
Sodium: 140 mmol/L (ref 137–147)

## 2015-05-30 LAB — LIPID PANEL
CHOLESTEROL: 226 mg/dL — AB (ref 0–200)
HDL: 41 mg/dL (ref 35–70)
LDL CALC: 156 mg/dL
LDl/HDL Ratio: 3.8
Triglycerides: 145 mg/dL (ref 40–160)

## 2015-05-30 LAB — CBC AND DIFFERENTIAL
HEMATOCRIT: 35 % — AB (ref 36–46)
HEMOGLOBIN: 11.1 g/dL — AB (ref 12.0–16.0)
PLATELETS: 313 10*3/uL (ref 150–399)
WBC: 6.4 10*3/mL

## 2015-05-30 LAB — TSH: TSH: 2.58 u[IU]/mL (ref 0.41–5.90)

## 2015-05-30 LAB — HEMOGLOBIN A1C: HEMOGLOBIN A1C: 6.2 % — AB (ref 4.0–6.0)

## 2015-06-13 ENCOUNTER — Encounter: Payer: Self-pay | Admitting: Internal Medicine

## 2015-06-13 ENCOUNTER — Ambulatory Visit (INDEPENDENT_AMBULATORY_CARE_PROVIDER_SITE_OTHER): Payer: Medicare Other | Admitting: Internal Medicine

## 2015-06-13 ENCOUNTER — Ambulatory Visit (INDEPENDENT_AMBULATORY_CARE_PROVIDER_SITE_OTHER)
Admission: RE | Admit: 2015-06-13 | Discharge: 2015-06-13 | Disposition: A | Discharge status: Home / Self Care | Payer: Medicare Other | Source: Ambulatory Visit | Attending: Internal Medicine | Admitting: Internal Medicine

## 2015-06-13 ENCOUNTER — Ambulatory Visit (INDEPENDENT_AMBULATORY_CARE_PROVIDER_SITE_OTHER)
Admission: RE | Admit: 2015-06-13 | Discharge: 2015-06-13 | Disposition: A | Discharge status: Home / Self Care | Payer: Medicare Other | Source: Ambulatory Visit

## 2015-06-13 ENCOUNTER — Other Ambulatory Visit: Payer: Medicare Other

## 2015-06-13 VITALS — BP 130/70 | HR 87 | Temp 98.1°F | Ht 60.75 in | Wt 217.0 lb

## 2015-06-13 DIAGNOSIS — G8929 Other chronic pain: Secondary | ICD-10-CM | POA: Diagnosis not present

## 2015-06-13 DIAGNOSIS — J45909 Unspecified asthma, uncomplicated: Secondary | ICD-10-CM

## 2015-06-13 DIAGNOSIS — M810 Age-related osteoporosis without current pathological fracture: Secondary | ICD-10-CM | POA: Diagnosis not present

## 2015-06-13 DIAGNOSIS — Z23 Encounter for immunization: Secondary | ICD-10-CM

## 2015-06-13 DIAGNOSIS — E7800 Pure hypercholesterolemia, unspecified: Secondary | ICD-10-CM

## 2015-06-13 DIAGNOSIS — K7689 Other specified diseases of liver: Secondary | ICD-10-CM | POA: Diagnosis not present

## 2015-06-13 DIAGNOSIS — R1011 Right upper quadrant pain: Secondary | ICD-10-CM | POA: Diagnosis not present

## 2015-06-13 DIAGNOSIS — R739 Hyperglycemia, unspecified: Secondary | ICD-10-CM

## 2015-06-13 DIAGNOSIS — R109 Unspecified abdominal pain: Secondary | ICD-10-CM

## 2015-06-13 DIAGNOSIS — I10 Essential (primary) hypertension: Secondary | ICD-10-CM

## 2015-06-13 DIAGNOSIS — M47816 Spondylosis without myelopathy or radiculopathy, lumbar region: Secondary | ICD-10-CM | POA: Diagnosis not present

## 2015-06-13 DIAGNOSIS — Z5181 Encounter for therapeutic drug level monitoring: Secondary | ICD-10-CM

## 2015-06-13 DIAGNOSIS — J449 Chronic obstructive pulmonary disease, unspecified: Secondary | ICD-10-CM

## 2015-06-13 DIAGNOSIS — Z0000 Encounter for general adult medical examination without abnormal findings: Secondary | ICD-10-CM

## 2015-06-13 MED ORDER — FLUTICASONE-SALMETEROL 500-50 MCG/DOSE IN AEPB: 1.0000 | INHALATION_SPRAY | Freq: Two times a day (BID) | RESPIRATORY_TRACT | Status: DC

## 2015-06-13 MED ORDER — LOSARTAN POTASSIUM 100 MG PO TABS: 100.0000 mg | ORAL_TABLET | Freq: Every day | ORAL | Status: DC

## 2015-06-13 MED ORDER — THEOPHYLLINE ER 300 MG PO TB12: 300.0000 mg | ORAL_TABLET | Freq: Two times a day (BID) | ORAL | Status: DC

## 2015-06-13 MED ORDER — FUROSEMIDE 40 MG PO TABS: 60.0000 mg | ORAL_TABLET | Freq: Every day | ORAL | Status: DC

## 2015-06-13 MED ORDER — GABAPENTIN 300 MG PO CAPS: 300.0000 mg | ORAL_CAPSULE | Freq: Three times a day (TID) | ORAL | Status: DC

## 2015-06-13 MED ORDER — TRAMADOL HCL 50 MG PO TABS: 50.0000 mg | ORAL_TABLET | Freq: Four times a day (QID) | ORAL | Status: DC | PRN

## 2015-06-13 MED ORDER — POTASSIUM CHLORIDE CRYS ER 20 MEQ PO TBCR: 20.0000 meq | EXTENDED_RELEASE_TABLET | Freq: Every day | ORAL | Status: DC

## 2015-06-13 MED ORDER — ALBUTEROL SULFATE (2.5 MG/3ML) 0.083% IN NEBU: 2.5000 mg | INHALATION_SOLUTION | Freq: Four times a day (QID) | RESPIRATORY_TRACT | Status: DC

## 2015-06-13 NOTE — Assessment & Plan Note (Signed)
Previously on atorva, then simva - stopped due to concern for "liver damage" with tx of large hepatic cysts in 2012 Reviewed last labs and reviewed importance of weight reduction with diet and exercise May consider resuming atorva if needed at next lipid check if abd [place] "ok" - see liver cyst

## 2015-06-13 NOTE — Assessment & Plan Note (Signed)
Reviewed prior hx "borderline" DM, no FH DM Reviewed elevated fasting level 05/2013 (119)  Lab Results  Component Value Date   HGBA1C 6.2* 05/30/2015

## 2015-06-13 NOTE — Progress Notes (Signed)
Subjective:    Patient ID: Lisa Hamilton, female    DOB: 11-26-1945, 69 y.o.   MRN: [id number]  HPI   Here for medicare wellness  Diet: heart healthy Physical activity: sedentary Depression/mood screen: negative Hearing: intact to whispered voice Visual acuity: grossly normal, performs annual eye exam  ADLs: capable Fall risk: none Home safety: good Cognitive evaluation: intact to orientation, naming, recall and repetition EOL planning: adv directives, full code/ I agree  I have personally reviewed and have noted 1. The patient's medical and social history 2. Their use of alcohol, tobacco or illicit drugs 3. Their current medications and supplements 4. The patient's functional ability including ADL's, fall risks, home safety risks and hearing or visual impairment. 5. Diet and physical activities 6. Evidence for depression or mood disorders  Also reviewed chronic medical conditions, interval events and current concerns  Past Medical History  Diagnosis Date  . Glaucoma 1977    EYE SURGERY  FOR TX OF GLAUCOMA--NO LONGER HAS GLAUCOMA  . COPD (chronic obstructive pulmonary disease) ([place])   . Asthmatic bronchitis   . Hypertension   . Venous insufficiency   . Obesity   . Hiatal hernia   . Diverticulosis of colon   . Hx of colonic polyps   . Hepatic cyst     s/p surgical intervention 05/2011 -[place]  . Medullary sponge kidney   . Renal calculus     s/p lithotripsy 06/2011  . DJD (degenerative joint disease)   . Osteoporosis   . Vitamin D deficiency   . Restless leg syndrome   . Lipoma   . Shingles 2009  . Constipation     chronic  . Kidney stone   . Allergy   . Cancer (H[facility])     skin cancer- squamous cell- scalp, right wrist  . Nontoxic multinodular goiter     pt unsure of this   Family History  Problem Relation Age of Onset  . Lung cancer Father   . Colon cancer Father     possible colon cancer, but unsure  . Lung cancer Sister   . Esophageal cancer Neg  Hx   . Rectal cancer Neg Hx   . Stomach cancer Neg Hx    Social History  Substance Use Topics  . Smoking status: Never Smoker   . Smokeless tobacco: Never Used  . Alcohol Use: 0.0 oz/week    0 Standard drinks or equivalent per week     Comment: social use    Review of Systems  Constitutional: Positive for fatigue. Negative for unexpected weight change.  Respiratory: Positive for shortness of breath. Negative for cough and wheezing.   Cardiovascular: Positive for leg swelling (chronic edema). Negative for chest pain and palpitations.  Gastrointestinal: Negative for nausea, abdominal pain, diarrhea, constipation and blood in stool.  Genitourinary:       Chronic R flank pain  Musculoskeletal: Positive for arthralgias.  Neurological: Negative for dizziness, weakness, light-headedness and headaches.  Psychiatric/Behavioral: Negative for dysphoric mood. The patient is not nervous/anxious.   All other systems reviewed and are negative.   Patient Care Team: Rowe Clack, MD as PCP - General (Internal Medicine) Irine Seal, MD (Urology) Barbaraann Rondo, MD (Gynecology) Rolm Bookbinder, MD (Dermatology) Rutherford Guys, MD (Ophthalmology) Max Villa Herb, DPM (Podiatry) Deneise Lever, MD (Pulmonary Disease) Milus Banister, MD (Gastroenterology)     Objective:    Physical Exam  Constitutional: She appears well-developed and well-nourished. No distress.  obese  Cardiovascular: Normal  rate, regular rhythm and normal heart sounds.   No murmur heard. Pulmonary/Chest: Effort normal and breath sounds normal. No respiratory distress.  Musculoskeletal: She exhibits no edema.  Back: full range of motion of thoracic and lumbar spine. Mildly tender to palpation in deep tissue over right flank below rib cage. Negative straight leg raise. DTR's are symmetrically intact. Sensation intact in all dermatomes of the lower extremities. Full strength to manual muscle testing. patient is able to heel toe  walk without difficulty and ambulates with antalgic gait.   Vitals reviewed.   BP 130/70 mmHg  Pulse 87  Temp(Src) 98.1 F (36.7 C) (Oral)  Ht 5' 0.75" (1.543 m)  Wt 217 lb (98.431 kg)  BMI 41.34 kg/m2  SpO2 97% Wt Readings from Last 3 Encounters:  06/13/15 217 lb (98.431 kg)  03/29/15 207 lb (93.895 kg)  11/29/14 195 lb (88.451 kg)    Lab Results  Component Value Date   WBC 6.4 05/30/2015   HGB 11.1* 05/30/2015   HCT 35* 05/30/2015   PLT 313 05/30/2015   GLUCOSE 82 08/04/2012   CHOL 226* 05/30/2015   TRIG 145 05/30/2015   HDL 41 05/30/2015   LDLCALC 156 05/30/2015   ALT 16 05/30/2015   AST 11* 05/30/2015   NA 140 05/30/2015   K 3.7 05/30/2015   CL 99 08/04/2012   CREATININE 0.6 05/30/2015   BUN 20 05/25/2014   CO2 29 08/04/2012   TSH 2.58 05/30/2015   INR 1.10 05/20/2011   HGBA1C 6.2* 05/30/2015    US Abdomen Complete  01/12/2014  CLINICAL DATA:  Abdominal pain, history of liver cysts post liver cyst drainage in 2012, hypertension, COPD, hypercholesterolemia, medullary sponge kidney, kidney stones, hiatal hernia EXAM: ULTRASOUND ABDOMEN COMPLETE COMPARISON:  11/16/2004 FINDINGS: Gallbladder: Normally distended without stones or wall thickening. No pericholecystic fluid or sonographic Murphy sign. Common bile duct: Diameter: Normal caliber 5 mm diameter Liver: Normal parenchymal echogenicity. Liver elongated, 24.4 cm length. Multiple cysts largest 13.0 x 14.2 x 16.0 cm in RIGHT lobe containing scattered low level internal echoes. Minimal dependent debris, inconsistently visualized. No definite mural nodularity. IVC: Normal appearance Pancreas: Obscured by bowel gas. Spleen: Normal appearance, 9.8 cm length Right Kidney: Length: 11.6 cm.  Normal morphology without mass or hydronephrosis. Left Kidney: Length: 12.4 cm.  Normal morphology without mass or hydronephrosis. Abdominal aorta: Distally obscured by bowel gas, visualized portions normal caliber Other findings: No free  fluid IMPRESSION: Multiple hepatic cysts with largest in RIGHT lobe 13.0 x 14.2 x 16.0 cm (on previous exam largest measured 10.7 x 9.9 x 11.3 cm). Nonvisualization of pancreas and distal abdominal aorta. Electronically Signed   By: Lavonia Dana M.D.   On: 01/12/2014 09:13       Assessment & Plan:   AWV/z00.00 - Today patient counseled on age appropriate routine health concerns for screening and prevention, each reviewed and up to date or declined. Immunizations reviewed and up to date or declined. Labs ordered and reviewed. Risk factors for depression reviewed and negative. Hearing function and visual acuity are intact. ADLs screened and addressed as needed. Functional ability and level of safety reviewed and appropriate. Education, counseling and referrals performed based on assessed risks today. Patient provided with a copy of personalized plan for preventive services.  Chronic right flank pain. Suspect related to degenerative disc disease rather than hepatic source. Previously followed with orthopedics for same but has not followed up since retirement of her orthopedic surgeon Dr. Shellia Carwin. Will check plain film today  to exclude compression fracture and evaluate severity of suspected lumbar DDD. Also schedule CT abdomen pelvis as noted below in further evaluation of known hepatic cyst and potential change related to these symptoms   Problem List Items Addressed This Visit    Asthma with COPD ([place])    Remote steroid dependent disease due to asthmatic bronchospasm component We'll continue to follow with pulmonary annually for same Reviewed concerns with cost of various inhaled steroid combinations Symptoms overall controlled on current regimen, no changes recommended, refills provided today Check theophylline level now for potential titration in future as needed      Relevant Medications   albuterol (PROVENTIL) (2.5 MG/3ML) 0.083% nebulizer solution   Essential hypertension    BP Readings  from Last 3 Encounters:  06/13/15 130/70  03/29/15 146/82  11/29/14 146/77   The current medical regimen is effective;  continue present plan and medications. The patient is asked to make an attempt to improve diet and exercise patterns to aid in medical management of this problem.      Relevant Medications   losartan (COZAAR) 100 MG tablet   furosemide (LASIX) 40 MG tablet   HYPERCHOLESTEROLEMIA    Previously on atorva, then simva - stopped due to concern for "liver damage" with tx of large hepatic cysts in 2012 Reviewed last labs and reviewed importance of weight reduction with diet and exercise May consider resuming atorva if needed at next lipid check if abd [place] "ok" - see liver cyst      Relevant Medications   losartan (COZAAR) 100 MG tablet   furosemide (LASIX) 40 MG tablet   Hyperglycemia    Reviewed prior hx "borderline" DM, no FH DM Reviewed elevated fasting level 05/2013 (119)  Lab Results  Component Value Date   HGBA1C 6.2* 05/30/2015         Liver cyst    Large cysts 2012, s/p unroofing by surgery 05/2011 Increase size on [place] 01/2014 Pt concerned with recurrence contributing to breathing symptoms (overlp with asthma) and chronic R flank -  will check Ct a/p to follow up on same, esp given potential need to resume atorva for lipids - see below      Relevant Orders   DG Lumbar Spine 2-3 Views   CT Abdomen Pelvis W Contrast   Osteoporosis    Prior history of bilateral wrist fracture following accidental fall/injury 2010 Last DEXA 2013 with improvement previously followed with gynecology, but will-schedule for follow-up now Continue calcium and vitamin D and weightbearing exercises as ongoing       Other Visit Diagnoses    Routine general medical examination at a health care facility    -  Primary    Right flank pain, chronic        Relevant Orders    DG Lumbar Spine 2-3 Views    CT Abdomen Pelvis W Contrast    Encounter for therapeutic drug monitoring         Relevant Orders    Theophylline level        Gwendolyn Grant, MD

## 2015-06-13 NOTE — Assessment & Plan Note (Signed)
BP Readings from Last 3 Encounters:  06/13/15 130/70  03/29/15 146/82  11/29/14 146/77   The current medical regimen is effective;  continue present plan and medications. The patient is asked to make an attempt to improve diet and exercise patterns to aid in medical management of this problem.

## 2015-06-13 NOTE — Assessment & Plan Note (Signed)
Large cysts 2012, s/p unroofing by surgery 05/2011 Increase size on [place] 01/2014 Pt concerned with recurrence contributing to breathing symptoms (overlp with asthma) and chronic R flank -  will check Ct a/p to follow up on same, esp given potential need to resume atorva for lipids - see below

## 2015-06-13 NOTE — Patient Instructions (Addendum)
It was good to see you today.  We have reviewed your prior records including labs and tests today  Annual flu shot updated today. Also will perform bone density. Other Health Maintenance reviewed - all recommended other immunizations and age-appropriate screenings are up-to-date.  Test(s) ordered today. Your results will be released to [place] (or called to you) after review, usually within 72hours after test completion. If any changes need to be made, you will be notified at that same time.  We will also refer for CT abdomen and pelvis to evaluate your flank pain, liver cyst. Our office or called this appointment once arranged  Medications reviewed and updated Increase use Lasix to 60 mg (1-1/2 tablet ) daily for swelling. No other changes recommended at this time.  Please schedule followup in 6 months for semi annual exam and labs with Dr. Sharlet Salina, call sooner if problems.  Please work on weight reduction between now and then or I will recommend resuming atorvastatin for your cholesterol to prevent heart attack risk

## 2015-06-13 NOTE — Assessment & Plan Note (Signed)
Prior history of bilateral wrist fracture following accidental fall/injury 2010 Last DEXA 2013 with improvement previously followed with gynecology, but will-schedule for follow-up now Continue calcium and vitamin D and weightbearing exercises as ongoing

## 2015-06-13 NOTE — Assessment & Plan Note (Signed)
Remote steroid dependent disease due to asthmatic bronchospasm component We'll continue to follow with pulmonary annually for same Reviewed concerns with cost of various inhaled steroid combinations Symptoms overall controlled on current regimen, no changes recommended, refills provided today Check theophylline level now for potential titration in future as needed

## 2015-06-14 ENCOUNTER — Telehealth: Payer: Self-pay

## 2015-06-14 LAB — THEOPHYLLINE LEVEL: THEOPHYLLINE LVL: 15.3 mg/L (ref 10.0–20.0)

## 2015-06-14 MED ORDER — ATORVASTATIN CALCIUM 20 MG PO TABS: 20.0000 mg | ORAL_TABLET | Freq: Every day | ORAL | Status: DC

## 2015-06-14 NOTE — Telephone Encounter (Signed)
-----   Message from Rowe Clack, MD sent at 06/14/2015  8:22 AM EST ----- Yes, this is good Please rx atorva 20 mg qd for this pt on my behalf to print and sign Thanks  ----- Message -----    From: Lowella Dandy, CMA    Sent: 06/13/2015  12:30 PM      To: Rowe Clack, MD  Patient came back upstairs for rx pick up and to schedule an appt. She said that she is interested in starting a statin. A tier 1 choice is atorvastatin. Rx to be printed for pt (I can print if you let me know which one you want to rx).   Thanks,  Destinae Neubecker/CMA

## 2015-06-14 NOTE — Telephone Encounter (Signed)
rx mailed to pt per pt request.

## 2015-06-16 DIAGNOSIS — N905 Atrophy of vulva: Secondary | ICD-10-CM | POA: Diagnosis not present

## 2015-06-16 DIAGNOSIS — N952 Postmenopausal atrophic vaginitis: Secondary | ICD-10-CM | POA: Diagnosis not present

## 2015-06-27 ENCOUNTER — Telehealth: Payer: Self-pay | Admitting: Internal Medicine

## 2015-06-27 MED ORDER — CLONAZEPAM 0.5 MG PO TABS: ORAL_TABLET | ORAL | Status: DC

## 2015-06-27 NOTE — Telephone Encounter (Signed)
Ok to refill 

## 2015-06-27 NOTE — Telephone Encounter (Signed)
signed

## 2015-06-27 NOTE — Telephone Encounter (Signed)
[name] brought prescription back to me in Triage to fax. Faxed to [facility]. Nothing further needed. Closing encounter

## 2015-06-27 NOTE — Telephone Encounter (Signed)
Patient calling to get refill on Clonazepam.  She was given 90 day supply with 3 refills on 09/28/14, however, refills expired before she could refill them.  She is requesting new prescription be [facility].  Allergies  Allergen Reactions  . Adhesive [Tape] Other (See Comments)    blisters  . Codeine Other (See Comments)    nightmares  . Amoxicillin-Pot Clavulanate Rash  . Penicillins Rash   Current Outpatient Prescriptions on File Prior to Visit  Medication Sig Dispense Refill  . albuterol (PROVENTIL) (2.5 MG/3ML) 0.083% nebulizer solution Take 3 mLs (2.5 mg total) by nebulization 4 (four) times daily. 450 mL 1  . aspirin 81 MG tablet Take 2 tablets by mouth at bedtime    . atorvastatin (LIPITOR) 20 MG tablet Take 1 tablet (20 mg total) by mouth daily. 90 tablet 3  . bisacodyl (DULCOLAX) 5 MG EC tablet Take 5 mg by mouth daily as needed. For constipation    . Calcium Carbonate-Vitamin D 600-400 MG-UNIT per tablet Take 1 tablet by mouth 3 (three) times daily.    . Cholecalciferol (VITAMIN D) 1000 UNITS capsule Take 1,000 Units by mouth daily. VITAMIN D 3    . clonazePAM (KLONOPIN) 0.5 MG tablet 1-3 taken 30 minutes before bed 270 tablet 3  . Co-Enzyme Q-10 100 MG CAPS Take 200 mg by mouth daily.     Marland Kitchen docusate sodium (COLACE) 100 MG capsule Take 200 mg by mouth at bedtime as needed. For constipation    . esomeprazole (NEXIUM) 20 MG capsule Take 20 mg by mouth daily at 12 noon.    . Fluticasone-Salmeterol (ADVAIR DISKUS) 500-50 MCG/DOSE AEPB Inhale 1 puff into the lungs every 12 (twelve) hours. 3 each 2  . furosemide (LASIX) 40 MG tablet Take 1.5 tablets (60 mg total) by mouth daily. 120 tablet 1  . gabapentin (NEURONTIN) 300 MG capsule Take 1 capsule (300 mg total) by mouth 3 (three) times daily. 270 capsule 1  . guaiFENesin (MUCINEX) 600 MG 12 hr tablet Take 600 mg by mouth 2 (two) times daily as needed.     Marland Kitchen ibuprofen (ADVIL,MOTRIN) 200 MG tablet Take 200 mg by mouth every 6 (six)  hours as needed.    Astrid Drafts Ultra Strength 1500 MG CAPS Take 1,500 mg by mouth daily.    Marland Kitchen losartan (COZAAR) 100 MG tablet Take 1 tablet (100 mg total) by mouth daily. 90 tablet 1  . Magnesium 400 MG CAPS Take 400 mg by mouth daily.    . montelukast (SINGULAIR) 10 MG tablet Take 1 tablet by mouth at  bedtime 90 tablet 3  . Multiple Vitamin (MULTIVITAMIN) capsule Take 1 capsule by mouth daily. Iron free    . potassium chloride SA (K-DUR,KLOR-CON) 20 MEQ tablet Take 1 tablet (20 mEq total) by mouth daily. 90 tablet 1  . Probiotic Product (PROBIOTIC DAILY PO) Take 1 tablet by mouth 2 (two) times daily.    Marland Kitchen rOPINIRole (REQUIP) 0.5 MG tablet Take 4 daily as directed 360 tablet 3  . theophylline (THEODUR) 300 MG 12 hr tablet Take 1 tablet (300 mg total) by mouth 2 (two) times daily. 180 tablet 1  . traMADol (ULTRAM) 50 MG tablet Take 1 tablet (50 mg total) by mouth every 6 (six) hours as needed for moderate pain or severe pain. 270 tablet 1   No current facility-administered medications on file prior to visit.

## 2015-06-27 NOTE — Telephone Encounter (Signed)
Rx printed and left on CY's cart to be signed Left with fax coversheet, Joellen Jersey, please fax to [facility] once this has been signed  Patient is aware that Rx is being faxed to [facility].

## 2015-06-27 NOTE — Telephone Encounter (Signed)
[name] - please advise if you have faxed this prescription?

## 2015-07-14 ENCOUNTER — Ambulatory Visit
Admission: RE | Admit: 2015-07-14 | Discharge: 2015-07-14 | Disposition: A | Discharge status: Home / Self Care | Payer: Medicare Other | Source: Ambulatory Visit | Attending: Internal Medicine | Admitting: Internal Medicine

## 2015-07-14 DIAGNOSIS — K7689 Other specified diseases of liver: Secondary | ICD-10-CM

## 2015-07-14 DIAGNOSIS — R109 Unspecified abdominal pain: Secondary | ICD-10-CM

## 2015-07-14 DIAGNOSIS — G8929 Other chronic pain: Secondary | ICD-10-CM

## 2015-07-14 DIAGNOSIS — N200 Calculus of kidney: Secondary | ICD-10-CM | POA: Diagnosis not present

## 2015-07-14 MED ORDER — IOPAMIDOL (ISOVUE-300) INJECTION 61%
125.0000 mL | Freq: Once | INTRAVENOUS | Status: AC | PRN
  Administered 2015-07-14: 125 mL via INTRAVENOUS

## 2015-08-01 ENCOUNTER — Other Ambulatory Visit: Payer: Self-pay | Admitting: Internal Medicine

## 2015-08-01 NOTE — Telephone Encounter (Signed)
Spoke to pt and pt stated that she does have the printed scripts and requested that we reject the refill.

## 2015-08-30 ENCOUNTER — Ambulatory Visit (INDEPENDENT_AMBULATORY_CARE_PROVIDER_SITE_OTHER): Payer: Medicare Other | Admitting: Internal Medicine

## 2015-08-30 ENCOUNTER — Encounter: Payer: Self-pay | Admitting: Internal Medicine

## 2015-08-30 ENCOUNTER — Other Ambulatory Visit (INDEPENDENT_AMBULATORY_CARE_PROVIDER_SITE_OTHER): Payer: Medicare Other

## 2015-08-30 VITALS — BP 130/78 | HR 94 | Temp 98.5°F | Resp 14 | Ht 61.0 in | Wt 235.0 lb

## 2015-08-30 DIAGNOSIS — R0600 Dyspnea, unspecified: Secondary | ICD-10-CM

## 2015-08-30 DIAGNOSIS — M7989 Other specified soft tissue disorders: Secondary | ICD-10-CM

## 2015-08-30 DIAGNOSIS — R5383 Other fatigue: Secondary | ICD-10-CM

## 2015-08-30 DIAGNOSIS — I872 Venous insufficiency (chronic) (peripheral): Secondary | ICD-10-CM | POA: Diagnosis not present

## 2015-08-30 LAB — T4, FREE: FREE T4: 1.06 ng/dL (ref 0.60–1.60)

## 2015-08-30 LAB — TSH: TSH: 3.26 u[IU]/mL (ref 0.35–4.50)

## 2015-08-30 LAB — BRAIN NATRIURETIC PEPTIDE: Pro B Natriuretic peptide (BNP): 29 pg/mL (ref 0.0–100.0)

## 2015-08-30 MED ORDER — FUROSEMIDE 40 MG PO TABS: 40.0000 mg | ORAL_TABLET | Freq: Two times a day (BID) | ORAL | Status: DC

## 2015-08-30 NOTE — Progress Notes (Signed)
Pre visit review using our clinic review tool, if applicable. No additional management support is needed unless otherwise documented below in the visit note. 

## 2015-08-30 NOTE — Assessment & Plan Note (Signed)
She is having acute worsening with assymetric swelling left >right which warrants US venous due to her morbid obesity. Also checking thyroid and BNP today. Prior diastolic dysfunction grade 1 on Echo in 2014. Increase lasix to 40 mg BID for better fluid control since she had bad experience with compression stockings. Explained etiology to her today and also talked to her about elevation.

## 2015-08-30 NOTE — Patient Instructions (Signed)
We will have you take 1 furosemide pill in the morning and 1 pill in the early afternoon to help with swelling.   The other thing you can do is to keep them elevated above the level of your heart when you can.   Venous Stasis or Chronic Venous Insufficiency Chronic venous insufficiency, also called venous stasis, is a condition that affects the veins in the legs. The condition prevents blood from being pumped through these veins effectively. Blood may no longer be pumped effectively from the legs back to the heart. This condition can range from mild to severe. With proper treatment, you should be able to continue with an active life. CAUSES  Chronic venous insufficiency occurs when the vein walls become stretched, weakened, or damaged or when valves within the vein are damaged. Some common causes of this include:  High blood pressure inside the veins (venous hypertension).  Increased blood pressure in the leg veins from long periods of sitting or standing.  A blood clot that blocks blood flow in a vein (deep vein thrombosis).  Inflammation of a superficial vein (phlebitis) that causes a blood clot to form. RISK FACTORS Various things can make you more likely to develop chronic venous insufficiency, including:  Family history of this condition.  Obesity.  Pregnancy.  Sedentary lifestyle.  Smoking.  Jobs requiring long periods of standing or sitting in one place.  Being a certain age. Women in their 47s and 41s and men in their 37s are more likely to develop this condition. SIGNS AND SYMPTOMS  Symptoms may include:   Varicose veins.  Skin breakdown or ulcers.  Reddened or discolored skin on the leg.  Brown, smooth, tight, and painful skin just above the ankle, usually on the inside surface (lipodermatosclerosis).  Swelling. DIAGNOSIS  To diagnose this condition, your health care provider will take a medical history and do a physical exam. The following tests may be ordered  to confirm the diagnosis:  Duplex ultrasound--A procedure that produces a picture of a blood vessel and nearby organs and also provides information on blood flow through the blood vessel.  Plethysmography--A procedure that tests blood flow.  A venogram, or venography--A procedure used to look at the veins using X-ray and dye. TREATMENT The goals of treatment are to help you return to an active life and to minimize pain or disability. Treatment will depend on the severity of the condition. Medical procedures may be needed for severe cases. Treatment options may include:   Use of compression stockings. These can help with symptoms and lower the chances of the problem getting worse, but they do not cure the problem.  Sclerotherapy--A procedure involving an injection of a material that "dissolves" the damaged veins. Other veins in the network of blood vessels take over the function of the damaged veins.  Surgery to remove the vein or cut off blood flow through the vein (vein stripping or laser ablation surgery).  Surgery to repair a valve. HOME CARE INSTRUCTIONS   Wear compression stockings as directed by your health care provider.  Only take over-the-counter or prescription medicines for pain, discomfort, or fever as directed by your health care provider.  Follow up with your health care provider as directed. SEEK MEDICAL CARE IF:   You have redness, swelling, or increasing pain in the affected area.  You see a red streak or line that extends up or down from the affected area.  You have a breakdown or loss of skin in the affected area, even  if the breakdown is small.  You have an injury to the affected area. SEEK IMMEDIATE MEDICAL CARE IF:   You have an injury and open wound in the affected area.  Your pain is severe and does not improve with medicine.  You have sudden numbness or weakness in the foot or ankle below the affected area, or you have trouble moving your foot or  ankle.  You have a fever or persistent symptoms for more than 2-3 days.  You have a fever and your symptoms suddenly get worse. MAKE SURE YOU:   Understand these instructions.  Will watch your condition.  Will get help right away if you are not doing well or get worse.   This information is not intended to replace advice given to you by your health care provider. Make sure you discuss any questions you have with your health care provider.   Document Released: 11/25/2006 Document Revised: 05/12/2013 Document Reviewed: 03/29/2013 Elsevier Interactive Patient Education [employer].

## 2015-08-30 NOTE — Progress Notes (Signed)
   Subjective:    Patient ID: Lisa Hamilton, female    DOB: Dec 10, 1945, 70 y.o.   MRN: [id number]  HPI The patient is a 70 YO female coming in for acute leg swelling. She has increased her lasix dosing several months ago which had helped for some time. She is urinating a lot when she takes it. Left more than right swelling. Denies pain in her calf but gets pain from the swelling which keeps her up at night. Has tried compression stockings in the past which cut into her and she is hesitant to try again. Denies long travels or car rides or immobility in the past month.   Review of Systems  Constitutional: Negative for fever, activity change, appetite change, fatigue and unexpected weight change.  Respiratory: Negative for cough, chest tightness, shortness of breath and wheezing.   Cardiovascular: Positive for leg swelling. Negative for chest pain and palpitations.  Musculoskeletal: Positive for myalgias.  Skin: Negative.       Objective:   Physical Exam  Constitutional: She is oriented to person, place, and time. She appears well-developed and well-nourished.  Overweight  HENT:  Head: Normocephalic and atraumatic.  Eyes: EOM are normal.  Neck: Normal range of motion.  Cardiovascular: Normal rate and regular rhythm.   Pulmonary/Chest: Effort normal and breath sounds normal. No respiratory distress. She has no wheezes. She has no rales.  Abdominal: Soft.  Musculoskeletal: She exhibits edema and tenderness.  2-3+ pitting edema to knees bilaterally, left greater than right. No skin color change or breakdown.  Neurological: She is alert and oriented to person, place, and time. Coordination normal.  Skin: Skin is warm and dry.   Filed Vitals:   08/30/15 0819 08/30/15 0851  BP: 162/96 130/78  Pulse: 94   Temp: 98.5 F (36.9 C)   TempSrc: Oral   Resp: 14   Height: 5\' 1"  (1.549 m)   Weight: 235 lb (106.595 kg)   SpO2: 90%       Assessment & Plan:

## 2015-09-04 ENCOUNTER — Ambulatory Visit (HOSPITAL_COMMUNITY)
Admission: RE | Admit: 2015-09-04 | Discharge: 2015-09-04 | Disposition: A | Discharge status: Home / Self Care | Payer: Medicare Other | Source: Ambulatory Visit | Attending: Cardiology | Admitting: Cardiology

## 2015-09-04 DIAGNOSIS — E785 Hyperlipidemia, unspecified: Secondary | ICD-10-CM | POA: Insufficient documentation

## 2015-09-04 DIAGNOSIS — I10 Essential (primary) hypertension: Secondary | ICD-10-CM | POA: Diagnosis not present

## 2015-09-04 DIAGNOSIS — M7989 Other specified soft tissue disorders: Secondary | ICD-10-CM | POA: Diagnosis not present

## 2015-09-04 DIAGNOSIS — E669 Obesity, unspecified: Secondary | ICD-10-CM | POA: Diagnosis not present

## 2015-09-06 ENCOUNTER — Telehealth: Payer: Self-pay | Admitting: Internal Medicine

## 2015-09-06 DIAGNOSIS — I872 Venous insufficiency (chronic) (peripheral): Secondary | ICD-10-CM

## 2015-09-06 NOTE — Telephone Encounter (Signed)
There was no evidence of blood clot in her legs. Would recommend to take 2 lasix in the morning and 1 in the afternoon. Another option would be the compression stockings to help with fluid.

## 2015-09-06 NOTE — Telephone Encounter (Signed)
Patient will increase lasix to two pills in the morning and one in the afternoon. She wants to know if she should try thigh high and open toe compression stockings  because the knee high slip down and they hurt her knees. She also wants to know what compression she should buy. Please advise, thanks. She would like a prescription for the compression stocking so she can try to get the insurance to pay for them.

## 2015-09-06 NOTE — Telephone Encounter (Signed)
Patient is requesting results of ultrasound.  Patient states swelling has moved up to her knees today. Patient states that taking the extra lasix did not help.  Would like to know what to do next.

## 2015-09-07 ENCOUNTER — Other Ambulatory Visit: Payer: Self-pay | Admitting: Geriatric Medicine

## 2015-09-07 MED ORDER — FUROSEMIDE 40 MG PO TABS: 40.0000 mg | ORAL_TABLET | Freq: Three times a day (TID) | ORAL | Status: DC

## 2015-09-07 NOTE — Telephone Encounter (Signed)
Spoke with patient. She will come pick up rx. I placed it in the cabinet.

## 2015-09-07 NOTE — Telephone Encounter (Signed)
Printed and signed.  

## 2015-09-07 NOTE — Addendum Note (Signed)
Addended by: Pricilla Holm A on: 09/07/2015 12:00 PM   Modules accepted: Orders

## 2015-10-03 ENCOUNTER — Encounter: Payer: Self-pay | Admitting: Internal Medicine

## 2015-10-03 ENCOUNTER — Ambulatory Visit (INDEPENDENT_AMBULATORY_CARE_PROVIDER_SITE_OTHER): Payer: Medicare Other | Admitting: Internal Medicine

## 2015-10-03 ENCOUNTER — Other Ambulatory Visit (INDEPENDENT_AMBULATORY_CARE_PROVIDER_SITE_OTHER): Payer: Medicare Other

## 2015-10-03 VITALS — BP 124/72 | HR 93 | Ht 61.0 in | Wt 236.6 lb

## 2015-10-03 DIAGNOSIS — J449 Chronic obstructive pulmonary disease, unspecified: Secondary | ICD-10-CM | POA: Diagnosis not present

## 2015-10-03 DIAGNOSIS — I872 Venous insufficiency (chronic) (peripheral): Secondary | ICD-10-CM

## 2015-10-03 DIAGNOSIS — G2581 Restless legs syndrome: Secondary | ICD-10-CM

## 2015-10-03 DIAGNOSIS — J45909 Unspecified asthma, uncomplicated: Secondary | ICD-10-CM | POA: Diagnosis not present

## 2015-10-03 LAB — BASIC METABOLIC PANEL
BUN: 14 mg/dL (ref 6–23)
CALCIUM: 9.3 mg/dL (ref 8.4–10.5)
CO2: 35 mEq/L — ABNORMAL HIGH (ref 19–32)
Chloride: 99 mEq/L (ref 96–112)
Creatinine, Ser: 0.68 mg/dL (ref 0.40–1.20)
GFR: 91.38 mL/min (ref >60.00)
GLUCOSE: 127 mg/dL — AB (ref 70–99)
Potassium: 3.2 mEq/L — ABNORMAL LOW (ref 3.5–5.1)
SODIUM: 140 meq/L (ref 135–145)

## 2015-10-03 MED ORDER — ROPINIROLE HCL 0.5 MG PO TABS: ORAL_TABLET | ORAL | Status: DC

## 2015-10-03 NOTE — Progress Notes (Signed)
07/26/14- 70 yoF   referred by Dr. Asa Lente; legs jump and jerk at night waking her up and she has back pain that keeps her up at night; Epworth Score: 10 Clinical diagnosis of restless leg syndrome made several years ago and treated with Requip. She is now taking Requip 1 g at 6:30 PM and gabapentin 300 mg at 4 PM and 600 mg at bedtime. This has gradually become less effective. She is tired during the day. Arms are starting to twitch some now. She does not describe sleepwalking, punching or other complex behaviors. Her husband sleeps in a separate room to avoid her activity. She is worse if she is tired. Marland Kitchen History of low back pain and she has lost 2 inches in height. She is trying to lose weight with [employer] and is down about 40 pounds, hoping this will help. Not aware of snoring. Sister and mother have similar limb jerks problems. No history of anemia.  09/28/14-70 yoF followed for Restless Legs and she has back pain that keeps her up at night; complicated by peripheral venous disease, hemochromatosis FOLLOWS FOR: Pt states the night time is better with her leg cramps; if she is up on her legs alot during the day she has noticed her legs swell up and cause more leg cramps at night. Pt is currently on diuretic. Hgb was 13.3, K 4.5 in October. Sleeping much better using  clonazepam 0.5 mg plus Requip. She asks if she could take correctional doses occasionally on long car rides and we discussed avoidance of stagnation of blood flow and drowsiness. She would not be driving. She gives blood every 8 weeks because of history of hemochromatosis, has not been iron deficient. Describes left posterior leg pain consistent with sciatica  03/28/15-    70 yoF followed for Restless Legs and she has back pain that keeps her up at night; complicated by peripheral venous disease, hemochromatosis FOLLOWS FOR: Pt states she needs to take a half clonazepam d/t drowsiness. Pt states the leg cramps have improved but she  is still kicking out during the night that is waking her. Pt states the restlessness is starting sooner in the day.  Restless legs getting worse and bothering her more now earlier in the day. Clonazepam is lasting too long. Asthma worse with heat and weather. Using rescue inhaler more. Using her nebulizer before bedtime and repeating at 3 AM.  10/03/2015-70 year old female never smoker followed for Restless Legs, back pain that disturbs sleep, asthma, complicated by peripheral venous disease, hemochromatosis Restless legs get jumpy around 4 PM. She takes her first Requip and gabapentin at 4 PM, takes Requip, gabapentin and one half clonazepam around 8 PM and again at 10:30 PM/bedtime. Occasionally wakes 1:30 or 2 AM to take one half gabapentin and tramadol. Says she gets enough sleep. Chronic peripheral venous insufficiency with peripheral edema being treated by Dr. Sharlet Salina.              ROS-see HPI Constitutional:   No-   weight loss, night sweats, fevers, chills, +fatigue, lassitude. HEENT:   No-  headaches, difficulty swallowing, tooth/dental problems, sore throat,       No-  sneezing, itching, ear ache, nasal congestion, post nasal drip,  CV:  No-   chest pain, orthopnea, PND, swelling in lower extremities, anasarca,  dizziness, palpitations Resp: No-   shortness of breath with exertion or at rest.              No-   productive cough,  No non-productive cough,  No- coughing up of blood.              No-   change in color of mucus. + wheezing.   Skin: No-   rash or lesions. GI:  No-   heartburn, indigestion, abdominal pain, nausea, vomiting,  GU:     MS:  No-   joint pain or swelling.  No- decreased range of motion.  + back pain. Neuro-     + per HPI Psych:  No- change in mood or affect. No depression or anxiety.  No memory loss.  OBJ- Physical Exam General- Alert, Oriented, Affect-appropriate, Distress- none acute, +obese Skin-  rash-none, lesions- none, excoriation- none Lymphadenopathy- none Head- atraumatic            Eyes- Gross vision intact, PERRLA, conjunctivae and secretions clear            Ears- Hearing, canals-normal            Nose- Clear, no-Septal dev, mucus, polyps, erosion, perforation             Throat- Mallampati II-III , mucosa clear , drainage- none, tonsils- atrophic Neck- flexible , trachea midline, no stridor , thyroid nl, carotid no bruit Chest - symmetrical excursion , unlabored           Heart/CV- RRR , no murmur , no gallop  , no rub, nl s1 s2                           - JVD- none , edema plus 2-3, stasis changes- none, varices +           Lung- clear to P&A, wheeze- none, cough- none , dullness-none, rub- none           Chest wall-  Abd-  Br/ Gen/ Rectal- Not done, not indicated Extrem- cyanosis- none, clubbing, none, atrophy- none, strength- nl, + soft superficial varices to thigh Neuro- grossly intact to observation, no unusual restlessness or movement

## 2015-10-03 NOTE — Patient Instructions (Addendum)
First we are going to try taking 2 Requip tabs at bedtime, so the prescription has been updated and sent  We talked about trying a juice glass of tonic water, flavored with a little OJ or tomato juice at supper. The quinine sometimes helps leg cramps.   Order- lab BMET     Dx venous(peripheral) insuficiency

## 2015-10-04 ENCOUNTER — Encounter: Payer: Self-pay | Admitting: Internal Medicine

## 2015-10-04 NOTE — Assessment & Plan Note (Signed)
Significant problem, familial by description but probably related to chronic peripheral venous insufficiency and peripheral edema. Plan-we discussed tonic water/quinine which sometimes helps cramps. It would be a benign thing to try. Meanwhile she is going to try increasing to 2 Requip tablets at bedtime. We discussed Sinemet as potential next step.

## 2015-10-04 NOTE — Assessment & Plan Note (Signed)
Significant problem being addressed by her primary physician. She is at increased risk for DVT and is advised to elevate her legs and avoid stasis.

## 2015-10-04 NOTE — Assessment & Plan Note (Signed)
Breathing has been comfortable for her level of activity with no routine wheeze or acute respiratory event. I'm not finding spirometry or PFT on chart review and she is a nonsmoker. This may not be a valid diagnosis. Plan-consider future spirometry

## 2015-10-05 ENCOUNTER — Other Ambulatory Visit: Payer: Self-pay | Admitting: Internal Medicine

## 2015-10-05 MED ORDER — POTASSIUM CHLORIDE CRYS ER 20 MEQ PO TBCR: EXTENDED_RELEASE_TABLET | ORAL | Status: DC

## 2015-10-19 DIAGNOSIS — Z961 Presence of intraocular lens: Secondary | ICD-10-CM | POA: Diagnosis not present

## 2015-10-19 DIAGNOSIS — H26491 Other secondary cataract, right eye: Secondary | ICD-10-CM | POA: Diagnosis not present

## 2015-10-25 ENCOUNTER — Other Ambulatory Visit: Payer: Self-pay | Admitting: Internal Medicine

## 2015-11-01 DIAGNOSIS — H26491 Other secondary cataract, right eye: Secondary | ICD-10-CM | POA: Diagnosis not present

## 2015-11-21 DIAGNOSIS — H353131 Nonexudative age-related macular degeneration, bilateral, early dry stage: Secondary | ICD-10-CM | POA: Diagnosis not present

## 2015-11-21 DIAGNOSIS — Z961 Presence of intraocular lens: Secondary | ICD-10-CM | POA: Diagnosis not present

## 2015-11-21 DIAGNOSIS — H35372 Puckering of macula, left eye: Secondary | ICD-10-CM | POA: Diagnosis not present

## 2015-11-21 DIAGNOSIS — H401132 Primary open-angle glaucoma, bilateral, moderate stage: Secondary | ICD-10-CM | POA: Diagnosis not present

## 2015-11-21 DIAGNOSIS — H2640 Unspecified secondary cataract: Secondary | ICD-10-CM | POA: Diagnosis not present

## 2015-11-21 DIAGNOSIS — H35371 Puckering of macula, right eye: Secondary | ICD-10-CM | POA: Diagnosis not present

## 2015-12-13 ENCOUNTER — Ambulatory Visit: Payer: Medicare Other | Admitting: Internal Medicine

## 2015-12-22 ENCOUNTER — Other Ambulatory Visit (INDEPENDENT_AMBULATORY_CARE_PROVIDER_SITE_OTHER): Payer: Medicare Other

## 2015-12-22 ENCOUNTER — Ambulatory Visit (INDEPENDENT_AMBULATORY_CARE_PROVIDER_SITE_OTHER): Payer: Medicare Other | Admitting: Internal Medicine

## 2015-12-22 ENCOUNTER — Encounter: Payer: Self-pay | Admitting: Internal Medicine

## 2015-12-22 VITALS — BP 134/62 | HR 86 | Temp 98.6°F | Resp 12 | Ht 61.0 in | Wt 223.0 lb

## 2015-12-22 DIAGNOSIS — E785 Hyperlipidemia, unspecified: Secondary | ICD-10-CM

## 2015-12-22 DIAGNOSIS — D649 Anemia, unspecified: Secondary | ICD-10-CM

## 2015-12-22 DIAGNOSIS — I872 Venous insufficiency (chronic) (peripheral): Secondary | ICD-10-CM

## 2015-12-22 LAB — LIPID PANEL
CHOLESTEROL: 129 mg/dL (ref 0–200)
HDL: 36.2 mg/dL — ABNORMAL LOW (ref >39.00)
LDL Cholesterol: 75 mg/dL (ref 0–99)
NonHDL: 93.1
Total CHOL/HDL Ratio: 4
Triglycerides: 92 mg/dL (ref 0.0–149.0)
VLDL: 18.4 mg/dL (ref 0.0–40.0)

## 2015-12-22 LAB — BASIC METABOLIC PANEL
BUN: 16 mg/dL (ref 6–23)
CALCIUM: 9.6 mg/dL (ref 8.4–10.5)
CO2: 30 mEq/L (ref 19–32)
CREATININE: 0.58 mg/dL (ref 0.40–1.20)
Chloride: 103 mEq/L (ref 96–112)
GFR: 109.72 mL/min (ref >60.00)
Glucose, Bld: 113 mg/dL — ABNORMAL HIGH (ref 70–99)
Potassium: 3.6 mEq/L (ref 3.5–5.1)
Sodium: 141 mEq/L (ref 135–145)

## 2015-12-22 LAB — CBC
HEMATOCRIT: 36.8 % (ref 36.0–46.0)
HEMOGLOBIN: 11.8 g/dL — AB (ref 12.0–15.0)
MCHC: 32 g/dL (ref 30.0–36.0)
MCV: 80.4 fl (ref 78.0–100.0)
Platelets: 333 10*3/uL (ref 150.0–400.0)
RBC: 4.58 Mil/uL (ref 3.87–5.11)
RDW: 17.8 % — AB (ref 11.5–15.5)
WBC: 8.1 10*3/uL (ref 4.0–10.5)

## 2015-12-22 LAB — FERRITIN: FERRITIN: 22.9 ng/mL (ref 10.0–291.0)

## 2015-12-22 MED ORDER — ALBUTEROL SULFATE (2.5 MG/3ML) 0.083% IN NEBU: 2.5000 mg | INHALATION_SOLUTION | Freq: Four times a day (QID) | RESPIRATORY_TRACT | Status: DC

## 2015-12-22 MED ORDER — MONTELUKAST SODIUM 10 MG PO TABS: ORAL_TABLET | ORAL | Status: DC

## 2015-12-22 MED ORDER — GABAPENTIN 300 MG PO CAPS: 300.0000 mg | ORAL_CAPSULE | Freq: Three times a day (TID) | ORAL | Status: DC

## 2015-12-22 MED ORDER — LOSARTAN POTASSIUM 100 MG PO TABS: 100.0000 mg | ORAL_TABLET | Freq: Every day | ORAL | Status: DC

## 2015-12-22 NOTE — Progress Notes (Signed)
   Subjective:    Patient ID: Lisa Hamilton, female    DOB: 01/09/1946, 70 y.o.   MRN: [id number]  HPI The patient is a 70 YO female coming in for follow up of her leg swelling (increased lasix and given leg compressions which is helping and no more pain), and her iron levels (has been rejected to give blood several times recently which is unusual, up to date on colonoscopy, no blood in stools or dark stools and no bleeding noted, takes multivitamin daily but no iron due to some gene changes and son with hemochromatosis).   Review of Systems  Constitutional: Negative for fever, activity change, appetite change, fatigue and unexpected weight change.  Respiratory: Negative for cough, chest tightness, shortness of breath and wheezing.   Cardiovascular: Positive for leg swelling. Negative for chest pain and palpitations.  Gastrointestinal: Negative.  Negative for blood in stool.  Musculoskeletal: Negative.   Skin: Negative.       Objective:   Physical Exam  Constitutional: She is oriented to person, place, and time. She appears well-developed and well-nourished.  Overweight  HENT:  Head: Normocephalic and atraumatic.  Eyes: EOM are normal.  Neck: Normal range of motion.  Cardiovascular: Normal rate and regular rhythm.   Pulmonary/Chest: Effort normal and breath sounds normal. No respiratory distress. She has no wheezes. She has no rales.  Abdominal: Soft.  Musculoskeletal: She exhibits edema and tenderness.  1-2+ edema bilaterally, left more than right but no signs of infection  Neurological: She is alert and oriented to person, place, and time. Coordination normal.  Skin: Skin is warm and dry.   Filed Vitals:   12/22/15 0932  BP: 134/62  Pulse: 86  Temp: 98.6 F (37 C)  TempSrc: Oral  Resp: 12  Height: 5\' 1"  (1.549 m)  Weight: 223 lb (101.152 kg)  SpO2: 96%      Assessment & Plan:

## 2015-12-22 NOTE — Patient Instructions (Addendum)
We will check the blood work today and send the results on mychart.   We have sent in the refills and given you the albuterol refill.   We will see you back in November for the physical and if you need [place] sooner please call or send a message on the mychart.

## 2015-12-22 NOTE — Assessment & Plan Note (Signed)
Checking BMP and adjust as needed. Taking lasix BID now and better with swelling. Also using compression sometimes with okay results and elevating the legs when she can. No signs of secondary cause and recent US negative for DVT.

## 2015-12-22 NOTE — Assessment & Plan Note (Signed)
Rejected from giving blood due to low counts and no increased source of blood or dark stools. Colonoscopy up to date. Checking CBC and ferritin today.

## 2015-12-22 NOTE — Progress Notes (Signed)
Pre visit review using our clinic review tool, if applicable. No additional management support is needed unless otherwise documented below in the visit note. 

## 2016-01-03 ENCOUNTER — Other Ambulatory Visit: Payer: Self-pay | Admitting: *Deleted

## 2016-01-04 MED ORDER — TRAMADOL HCL 50 MG PO TABS: 50.0000 mg | ORAL_TABLET | Freq: Three times a day (TID) | ORAL | Status: DC | PRN

## 2016-01-04 NOTE — Telephone Encounter (Signed)
I do not write 3 month supply on tramadol but can fill 1 month with refills.

## 2016-01-04 NOTE — Telephone Encounter (Signed)
Yes.. Through [facility] last filled 06/13/15...Johny Chess

## 2016-01-04 NOTE — Telephone Encounter (Signed)
This is a very high amount, is this a 3 month supply?

## 2016-01-04 NOTE — Telephone Encounter (Signed)
Notified pt w/MD response. Faxing script to [facility]...Chryl Heck

## 2016-01-22 ENCOUNTER — Telehealth: Payer: Self-pay

## 2016-01-22 NOTE — Telephone Encounter (Signed)
This was just filled on 01/04/16 with refills and is not eligible for refill.

## 2016-01-22 NOTE — Telephone Encounter (Signed)
Rx was sent tot her local pharmacy on  01/04/16 see MD msg below..Notified pt with MD response...Johny Chess   Earnstine Regal, MA at 01/04/2016 11:59 AM     Status: Signed       Expand All Collapse All   Notified pt w/MD response. Faxing script to [facility].../lb            Hoyt Koch, MD at 01/04/2016 11:34 AM     Status: Signed       Expand All Collapse All   I do not write 3 month supply on tramadol but can fill 1 month with refills

## 2016-01-22 NOTE — Telephone Encounter (Signed)
Called [facility] to see if rx was received. Spoke w/Kevin he states tHey do not have a record of it. Gave md authorization from 6/1 on Tramadol...Johny Chess

## 2016-01-22 NOTE — Telephone Encounter (Signed)
traMADol (ULTRAM) 50 MG tablet [[id number]]       traMADol (ULTRAM) 50 MG tablet [id number]      Patient is requesting a refill on this. States the rx needs to be 52month supply or [facility] with not fill the rx. Can you please follow up thank you!

## 2016-02-21 ENCOUNTER — Ambulatory Visit (INDEPENDENT_AMBULATORY_CARE_PROVIDER_SITE_OTHER): Payer: Medicare Other | Admitting: Family

## 2016-02-21 ENCOUNTER — Encounter: Payer: Self-pay | Admitting: Family

## 2016-02-21 ENCOUNTER — Telehealth: Payer: Self-pay | Admitting: Internal Medicine

## 2016-02-21 VITALS — BP 164/70 | HR 95 | Temp 98.1°F | Ht 61.0 in | Wt 224.0 lb

## 2016-02-21 DIAGNOSIS — R600 Localized edema: Secondary | ICD-10-CM

## 2016-02-21 DIAGNOSIS — R21 Rash and other nonspecific skin eruption: Secondary | ICD-10-CM

## 2016-02-21 MED ORDER — TRIAMCINOLONE ACETONIDE 0.1 % EX CREA: 1.0000 "application " | TOPICAL_CREAM | Freq: Two times a day (BID) | CUTANEOUS | Status: DC

## 2016-02-21 NOTE — Assessment & Plan Note (Signed)
Bilateral lower extremity edema is most likely multifactorial between medication and comorbid conditions. No evidence of calf pain or blood clot upon physical exam. Encourage conservative treatment with leg elevation, decrease sodium in diet, and compression socks. Increase furosemide 4 days to help with swelling. Follow-up if symptoms worsen or do not improve.

## 2016-02-21 NOTE — Progress Notes (Signed)
Subjective:    Patient ID: Lisa Hamilton, female    DOB: 07-Sep-1945, 70 y.o.   MRN: [id number]  Chief Complaint  Patient presents with  . Leg Swelling    pitting edema noted on LLE.     HPI:  Lisa Hamilton is a 70 y.o. female who  has a past medical history of Glaucoma (1977); COPD (chronic obstructive pulmonary disease) ([place]); Asthmatic bronchitis; Hypertension; Venous insufficiency; Obesity; Hiatal hernia; Diverticulosis of colon; colonic polyps; Hepatic cyst; Medullary sponge kidney; Renal calculus; DJD (degenerative joint disease); Osteoporosis; Vitamin D deficiency; Restless leg syndrome; Lipoma; Shingles (2009); Constipation; Kidney stone; Allergy; Cancer ([place]); and Nontoxic multinodular goiter. and presents today for an acute office visit.   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 Associated symptoms of edema and redness located in her bilateral lower extremities with the right side being greater than the left. No fevers. Lisa Hamilton of symptoms is worse at night. Modifying factors include elevation and previous increase in furosemide which did not help very much. Does have compression socks, but is not currently wearing them secondary to heat. Endorses some pain at night. Denies chest pain or heart palpitations. Has some shortness of breath with exertion on occasion. She also notice a rash located in the same location that started about 3 weeks ago.  Allergies  Allergen Reactions  . Adhesive [Tape] Other (See Comments)    blisters  . Codeine Other (See Comments)    nightmares  . Amoxicillin-Pot Clavulanate Rash  . Penicillins Rash     Current Outpatient Prescriptions on File Prior to Visit  Medication Sig Dispense Refill  . albuterol (PROVENTIL) (2.5 MG/3ML) 0.083% nebulizer solution Take 3 mLs (2.5 mg total) by nebulization 4 (four) times daily. 450 mL 1  . aspirin 81 MG tablet Take 2 tablets by mouth at bedtime    . atorvastatin (LIPITOR) 20 MG tablet Take 1 tablet (20 mg  total) by mouth daily. 90 tablet 3  . bisacodyl (DULCOLAX) 5 MG EC tablet Take 5 mg by mouth daily as needed. For constipation    . Calcium Carbonate-Vitamin D 600-400 MG-UNIT per tablet Take 1 tablet by mouth 3 (three) times daily.    . Cholecalciferol (VITAMIN D) 1000 UNITS capsule Take 1,000 Units by mouth daily. VITAMIN D 3    . clonazePAM (KLONOPIN) 0.5 MG tablet 1-3 taken 30 minutes before bed 270 tablet 3  . Co-Enzyme Q-10 100 MG CAPS Take 200 mg by mouth daily.     Marland Kitchen docusate sodium (COLACE) 100 MG capsule Take 200 mg by mouth at bedtime as needed. For constipation    . esomeprazole (NEXIUM) 20 MG capsule Take 20 mg by mouth daily at 12 noon.    . Fluticasone-Salmeterol (ADVAIR DISKUS) 500-50 MCG/DOSE AEPB Inhale 1 puff into the lungs every 12 (twelve) hours. 3 each 2  . furosemide (LASIX) 40 MG tablet Take 1 tablet (40 mg total) by mouth 3 (three) times daily. 270 tablet 3  . gabapentin (NEURONTIN) 300 MG capsule Take 1 capsule (300 mg total) by mouth 3 (three) times daily. 270 capsule 3  . guaiFENesin (MUCINEX) 600 MG 12 hr tablet Take 600 mg by mouth 2 (two) times daily as needed. Reported on 12/22/2015    . ibuprofen (ADVIL,MOTRIN) 200 MG tablet Take 200 mg by mouth every 6 (six) hours as needed.    Astrid Drafts Ultra Strength 1500 MG CAPS Take 1,500 mg by mouth daily.    Marland Kitchen losartan (COZAAR) 100  MG tablet Take 1 tablet (100 mg total) by mouth daily. 90 tablet 3  . Magnesium 400 MG CAPS Take 400 mg by mouth daily.    . montelukast (SINGULAIR) 10 MG tablet Take 1 tablet by mouth at  bedtime 90 tablet 3  . Multiple Vitamin (MULTIVITAMIN) capsule Take 1 capsule by mouth daily. Iron free    . potassium chloride SA (K-DUR,KLOR-CON) 20 MEQ tablet Take one tablet one day then two tablets the next day, alternating every other day. 135 tablet 1  . Probiotic Product (PROBIOTIC DAILY PO) Take 1 tablet by mouth 2 (two) times daily.    Marland Kitchen rOPINIRole (REQUIP) 0.5 MG tablet Take 5 daily as directed 450  tablet 3  . theophylline (THEODUR) 300 MG 12 hr tablet Take 1 tablet by mouth two  times daily 180 tablet 2  . traMADol (ULTRAM) 50 MG tablet Take 1 tablet (50 mg total) by mouth every 8 (eight) hours as needed for moderate pain or Hamilton pain. 90 tablet 5   No current facility-administered medications on file prior to visit.     Past Surgical History  Procedure Laterality Date  . Tonsillectomy  1967  . Glaucoma surgery  1977  . Bilateral wrist fracture, right  1996    left 2001, Dr. Daylene Katayama  . Bilateral carpel tunnel repairs      Dr. Shellia Carwin  left--1999   right--2008  . Squamous cell carcinoma excision  2010    [place]  . Laparoscopic liver cyst fenestration  05/2011  . Hernia repair  1963  . Hernia repair  2012    umb hernia  . Shoulder open rotator cuff repair  08/13/2012    Procedure: ROTATOR CUFF REPAIR SHOULDER OPEN;  Surgeon: Magnus Sinning, MD;  Location: [facility];  Service: Orthopedics;  Laterality: Left;  LEFT SHOULDER ANTERIOR ACROMINECTOMY AND ROTATOR CUFF REPAIR AND EXCISION LEFT SHOULDER LIPOMA  . Lipoma excision  08/13/2012    Procedure: EXCISION LIPOMA;  Surgeon: Magnus Sinning, MD;  Location: [facility];  Service: Orthopedics;  Laterality: Left;  . Cataract extraction  04/07/13 R, 05/18/13 L    [facility]  . Colonoscopy    . Eye surgery Left 11/22/2014    "MD pucker" repair     Review of Systems  Constitutional: Negative for fever and chills.  Respiratory: Negative for chest tightness.   Cardiovascular: Positive for leg swelling. Negative for chest pain and palpitations.  Skin: Positive for rash.  Neurological: Negative for headaches.      Objective:    BP 164/70 mmHg  Pulse 95  Temp(Src) 98.1 F (36.7 C) (Oral)  Ht 5\' 1"  (1.549 m)  Wt 224 lb (101.606 kg)  BMI 42.35 kg/m2  SpO2 94% Nursing note and vital signs reviewed.  Physical Exam  Constitutional: She is oriented to person, place, and time. She appears well-developed and well-nourished. No distress.   Cardiovascular: Normal rate, regular rhythm, normal heart sounds and intact distal pulses.   Pulmonary/Chest: Effort normal and breath sounds normal.  Neurological: She is alert and oriented to person, place, and time.  Skin: Skin is warm and dry. Rash (maculopapular rash diffusely located around the left lower extremity with some redness. ) noted.  Psychiatric: She has a normal mood and affect. Her behavior is normal. Judgment and thought content normal.       Assessment & Plan:   Problem List Items Addressed This Visit      Musculoskeletal and Integument   Rash and nonspecific skin  eruption    Rash appears consistent with dermatitis with no discharge. Start triamcinolone. Follow-up if symptoms worsen or do not improve.        Other   Bilateral lower extremity edema - Primary    Bilateral lower extremity edema is most likely multifactorial between medication and comorbid conditions. No evidence of calf pain or blood clot upon physical exam. Encourage conservative treatment with leg elevation, decrease sodium in diet, and compression socks. Increase furosemide 4 days to help with swelling. Follow-up if symptoms worsen or do not improve.          I am having Ms. Hamideh start on triamcinolone cream. I am also having her maintain her aspirin, multivitamin, Vitamin D, Co-Enzyme Q-10, Calcium Carbonate-Vitamin D, Magnesium, Krill Oil Ultra Strength, docusate sodium, bisacodyl, guaiFENesin, ibuprofen, Probiotic Product (PROBIOTIC DAILY PO), esomeprazole, Fluticasone-Salmeterol, atorvastatin, clonazePAM, furosemide, rOPINIRole, potassium chloride SA, theophylline, albuterol, losartan, montelukast, gabapentin, and traMADol.   Meds ordered this encounter  Medications  . triamcinolone cream (KENALOG) 0.1 %    Sig: Apply 1 application topically 2 (two) times daily.    Dispense:  30 g    Refill:  0    Order Specific Question:  Supervising Provider    Answer:  Pricilla Holm A [id number]      Follow-up: Return if symptoms worsen or fail to improve.  Mauricio Po, FNP

## 2016-02-21 NOTE — Telephone Encounter (Signed)
Patient Name: Lisa Hamilton  DOB: 11/07/1945    Initial Comment caller states she has a rash on her legs that she thinks is from her medication   Nurse Assessment  Nurse: Mallie Mussel, RN, Alveta Heimlich Date/Time Eilene Ghazi Time): 02/21/2016 8:49:06 AM  Confirm and document reason for call. If symptomatic, describe symptoms. You must click the next button to save text entered. ---Caller states that she has been on Gabapentin "a while." Beginning about a month ago, she noticed swelling of her legs and feet. She has swelling up to her knee. She was seen and had her furosemide increased. It helped with the swelling for a couple weeks, and now its back. The left leg looks more swollen than the right one. She has a rash on her legs and feet. She describes the rash as red raised bumps. At night, it feels warm to the touch. It itches all the time. She has a gel that she uses on it to help with the itching. She also states that her head has been itching as well.  Has the patient traveled out of the country within the last 30 days? ---No  Does the patient have any new or worsening symptoms? ---Yes  Will a triage be completed? ---Yes  Related visit to physician within the last 2 weeks? ---No  Does the PT have any chronic conditions? (i.e. diabetes, asthma, etc.) ---Yes  List chronic conditions. ---Asthma, HTN, Hypercholesterolemia  Is this a behavioral health or substance abuse call? ---No     Guidelines    Guideline Title Affirmed Question Affirmed Notes  Leg Swelling and Edema [1] Difficulty breathing with exertion (e.g., walking) AND [2] new onset or worsening    Final Disposition User   Go to ED Now (or PCP triage) Mallie Mussel, RN, [place]    Comments  Dr. Sharlet Salina is not working all day and is full at this time. I scheduled her to see Dr. Terri Piedra at 10:45am today.   Referrals  REFERRED TO PCP OFFICE   Disagree/Comply: Comply

## 2016-02-21 NOTE — Assessment & Plan Note (Signed)
Rash appears consistent with dermatitis with no discharge. Start triamcinolone. Follow-up if symptoms worsen or do not improve.

## 2016-02-21 NOTE — Patient Instructions (Signed)
Thank you for choosing [facility].  Summary/Instructions:  Please continue to take your medications as prescribed.  Apply the triamcinolone cream 2x per day to help with itching.  Elevate your legs as able; Decrease sodium intake; compression socks as able.   May increase furosemide by 1 pill for the next 3-4 days.   Your prescription(s) have been submitted to your pharmacy or been printed and provided for you. Please take as directed and contact our office if you believe you are having problem(s) with the medication(s) or have any questions.  If your symptoms worsen or fail to improve, please contact our office for further instruction, or in case of emergency go directly to the emergency room at the closest medical facility.

## 2016-02-21 NOTE — Telephone Encounter (Signed)
Please advise 

## 2016-02-21 NOTE — Telephone Encounter (Signed)
Appears to have been scheduled with Terri Piedra which is fine.

## 2016-02-21 NOTE — Progress Notes (Signed)
Pre visit review using our clinic review tool, if applicable. No additional management support is needed unless otherwise documented below in the visit note. 

## 2016-03-12 ENCOUNTER — Other Ambulatory Visit: Payer: Self-pay | Admitting: Family

## 2016-03-12 MED ORDER — TRIAMCINOLONE ACETONIDE 0.1 % EX CREA: 1.0000 "application " | TOPICAL_CREAM | Freq: Two times a day (BID) | CUTANEOUS | 0 refills | Status: DC

## 2016-04-03 ENCOUNTER — Other Ambulatory Visit: Payer: Self-pay | Admitting: Internal Medicine

## 2016-04-03 DIAGNOSIS — L821 Other seborrheic keratosis: Secondary | ICD-10-CM | POA: Diagnosis not present

## 2016-04-03 DIAGNOSIS — D1801 Hemangioma of skin and subcutaneous tissue: Secondary | ICD-10-CM | POA: Diagnosis not present

## 2016-04-03 DIAGNOSIS — L738 Other specified follicular disorders: Secondary | ICD-10-CM | POA: Diagnosis not present

## 2016-04-03 DIAGNOSIS — Z85828 Personal history of other malignant neoplasm of skin: Secondary | ICD-10-CM | POA: Diagnosis not present

## 2016-04-03 DIAGNOSIS — D692 Other nonthrombocytopenic purpura: Secondary | ICD-10-CM | POA: Diagnosis not present

## 2016-04-03 DIAGNOSIS — D2371 Other benign neoplasm of skin of right lower limb, including hip: Secondary | ICD-10-CM | POA: Diagnosis not present

## 2016-04-03 DIAGNOSIS — D225 Melanocytic nevi of trunk: Secondary | ICD-10-CM | POA: Diagnosis not present

## 2016-04-09 ENCOUNTER — Ambulatory Visit: Payer: Medicare Other | Admitting: Internal Medicine

## 2016-05-11 DIAGNOSIS — Z23 Encounter for immunization: Secondary | ICD-10-CM | POA: Diagnosis not present

## 2016-05-13 DIAGNOSIS — R8271 Bacteriuria: Secondary | ICD-10-CM | POA: Diagnosis not present

## 2016-05-13 DIAGNOSIS — N200 Calculus of kidney: Secondary | ICD-10-CM | POA: Diagnosis not present

## 2016-05-21 DIAGNOSIS — H35373 Puckering of macula, bilateral: Secondary | ICD-10-CM | POA: Diagnosis not present

## 2016-05-21 DIAGNOSIS — Z961 Presence of intraocular lens: Secondary | ICD-10-CM | POA: Diagnosis not present

## 2016-05-21 DIAGNOSIS — H401132 Primary open-angle glaucoma, bilateral, moderate stage: Secondary | ICD-10-CM | POA: Diagnosis not present

## 2016-05-21 DIAGNOSIS — H353131 Nonexudative age-related macular degeneration, bilateral, early dry stage: Secondary | ICD-10-CM | POA: Diagnosis not present

## 2016-05-21 DIAGNOSIS — H2640 Unspecified secondary cataract: Secondary | ICD-10-CM | POA: Diagnosis not present

## 2016-05-21 DIAGNOSIS — H35363 Drusen (degenerative) of macula, bilateral: Secondary | ICD-10-CM | POA: Diagnosis not present

## 2016-05-22 ENCOUNTER — Other Ambulatory Visit: Payer: Self-pay | Admitting: Internal Medicine

## 2016-06-13 ENCOUNTER — Other Ambulatory Visit: Payer: Self-pay | Admitting: Internal Medicine

## 2016-06-13 ENCOUNTER — Other Ambulatory Visit (INDEPENDENT_AMBULATORY_CARE_PROVIDER_SITE_OTHER): Payer: Medicare Other

## 2016-06-13 ENCOUNTER — Ambulatory Visit (INDEPENDENT_AMBULATORY_CARE_PROVIDER_SITE_OTHER): Payer: Medicare Other | Admitting: Internal Medicine

## 2016-06-13 ENCOUNTER — Encounter: Payer: Self-pay | Admitting: Internal Medicine

## 2016-06-13 VITALS — BP 142/70 | HR 88 | Temp 98.5°F | Resp 20 | Ht 61.0 in | Wt 230.0 lb

## 2016-06-13 DIAGNOSIS — J449 Chronic obstructive pulmonary disease, unspecified: Secondary | ICD-10-CM | POA: Diagnosis not present

## 2016-06-13 DIAGNOSIS — E7800 Pure hypercholesterolemia, unspecified: Secondary | ICD-10-CM

## 2016-06-13 DIAGNOSIS — R739 Hyperglycemia, unspecified: Secondary | ICD-10-CM

## 2016-06-13 DIAGNOSIS — Z1159 Encounter for screening for other viral diseases: Secondary | ICD-10-CM

## 2016-06-13 DIAGNOSIS — R600 Localized edema: Secondary | ICD-10-CM | POA: Diagnosis not present

## 2016-06-13 DIAGNOSIS — R7309 Other abnormal glucose: Secondary | ICD-10-CM

## 2016-06-13 DIAGNOSIS — I10 Essential (primary) hypertension: Secondary | ICD-10-CM | POA: Diagnosis not present

## 2016-06-13 LAB — CBC
HEMATOCRIT: 39.8 % (ref 36.0–46.0)
HEMOGLOBIN: 12.9 g/dL (ref 12.0–15.0)
MCHC: 32.6 g/dL (ref 30.0–36.0)
MCV: 87.2 fl (ref 78.0–100.0)
PLATELETS: 297 10*3/uL (ref 150.0–400.0)
RBC: 4.56 Mil/uL (ref 3.87–5.11)
RDW: 16.9 % — AB (ref 11.5–15.5)
WBC: 8.7 10*3/uL (ref 4.0–10.5)

## 2016-06-13 LAB — COMPREHENSIVE METABOLIC PANEL
ALBUMIN: 4 g/dL (ref 3.5–5.2)
ALT: 16 U/L (ref 0–35)
AST: 16 U/L (ref 0–37)
Alkaline Phosphatase: 122 U/L — ABNORMAL HIGH (ref 39–117)
BUN: 13 mg/dL (ref 6–23)
CHLORIDE: 101 meq/L (ref 96–112)
CO2: 36 mEq/L — ABNORMAL HIGH (ref 19–32)
CREATININE: 0.65 mg/dL (ref 0.40–1.20)
Calcium: 9.9 mg/dL (ref 8.4–10.5)
GFR: 96.07 mL/min (ref >60.00)
GLUCOSE: 120 mg/dL — AB (ref 70–99)
POTASSIUM: 3.1 meq/L — AB (ref 3.5–5.1)
SODIUM: 145 meq/L (ref 135–145)
TOTAL PROTEIN: 7.2 g/dL (ref 6.0–8.3)
Total Bilirubin: 0.5 mg/dL (ref 0.2–1.2)

## 2016-06-13 LAB — HEMOGLOBIN A1C: HEMOGLOBIN A1C: 6.5 % (ref 4.6–6.5)

## 2016-06-13 MED ORDER — PRAVASTATIN SODIUM 40 MG PO TABS: 40.0000 mg | ORAL_TABLET | Freq: Every day | ORAL | 3 refills | Status: DC

## 2016-06-13 MED ORDER — POTASSIUM CHLORIDE CRYS ER 20 MEQ PO TBCR: EXTENDED_RELEASE_TABLET | ORAL | 1 refills | Status: DC

## 2016-06-13 NOTE — Assessment & Plan Note (Signed)
Given rx for nebulizer and checking theophylline level. Will forward this note to her pulmonary specialist and ask him to take over her theophylline dosing and treatment and evaluate if this is still appropriate.

## 2016-06-13 NOTE — Patient Instructions (Signed)
We are checking the labs today.   We have given you the prescription for the nebulizer to get a new one.   We have [facility] the new cholesterol medicine which should not give you the muscle problems. It is called pravastatin and you take 1 pill daily. Stop taking atorvastatin (also called lipitor).   Since we could not get to the wellness visit today I would recommend to come back with our health coach so we can make sure we are not missing anything with your health.

## 2016-06-13 NOTE — Progress Notes (Signed)
Pre visit review using our clinic review tool, if applicable. No additional management support is needed unless otherwise documented below in the visit note. 

## 2016-06-13 NOTE — Assessment & Plan Note (Signed)
Referral to vascular per her request. Advised her that this is no fix to this problem. Advised that wearing compression stockings all the time can be helpful and I would be hesitant to change her lasix dosing since it is not helping with her leg fluid. No skin breakdown or rash complicating. Prior kidney, liver, and BNP negative for cause of the fluid. Not suspect for DVT as this is a longstanding chronic problem.

## 2016-06-13 NOTE — Assessment & Plan Note (Signed)
Checking HgA1c as last was 6.2 and her weight is slightly up.

## 2016-06-13 NOTE — Assessment & Plan Note (Signed)
She will stop lipitor and start pravastatin 40 mg daily. She does need to be on moderate or high intensity statin for heart risk and she agrees to try this.

## 2016-06-13 NOTE — Progress Notes (Signed)
   Subjective:    Patient ID: [name], female    DOB: 22-Apr-1946, 70 y.o.   MRN: [id number]  HPI The patient is a 70 YO female here for annual wellness visit however she has many concerns which she would rather address today. She is concerned about her legs swelling still (addressed in May and evaluated for other causes which are not found, seen again in July and her lasix was temporarily adjusted, she does not feel that the lasix helps with the swelling, she is wearing compression hose some of the time and this seems to help, trying to elevate her legs about 3-4 hours per day and this helps but when she is up on her feet it comes right back), as well as her cholesterol (she is now on lipitor which we changed at last visit, she is having muscle cramps and weakness, she has read about the side effects and does not want to take anymore) and her asthma (her nebulizer is not working well anymore, instead of getting a new battery for it she wants to get a new one that works better, she is using her nebulizer as needed, she does still take theophylline but her lung doctor does not monitor or prescribe this medication for some reason, she also had to reschedule last visit with them and cannot get in until January).   Review of Systems  Constitutional: Negative for activity change, appetite change, fatigue, fever and unexpected weight change.  HENT: Negative.   Eyes: Negative.   Respiratory: Positive for shortness of breath. Negative for cough, chest tightness and wheezing.   Cardiovascular: Positive for leg swelling. Negative for chest pain and palpitations.  Gastrointestinal: Negative for abdominal distention, abdominal pain, constipation, diarrhea and nausea.  Musculoskeletal: Positive for arthralgias and myalgias. Negative for back pain and gait problem.  Skin: Negative.   Neurological: Negative.   Psychiatric/Behavioral: Negative.       Objective:   Physical Exam  Constitutional: She is  oriented to person, place, and time. She appears well-developed and well-nourished.  Obese  HENT:  Head: Normocephalic and atraumatic.  Eyes: EOM are normal.  Neck: Normal range of motion.  Cardiovascular: Normal rate and regular rhythm.   Pulmonary/Chest: Effort normal. No respiratory distress. She has no wheezes. She has no rales.  Abdominal: Soft. She exhibits no distension. There is no tenderness. There is no rebound.  Musculoskeletal: She exhibits edema.  2-3+ edema bilateral legs to the knee, no calf tenderness, no skin color change  Neurological: She is alert and oriented to person, place, and time. Coordination normal.  Skin: Skin is warm and dry.   Vitals:   06/13/16 0939 06/13/16 1101  BP: (!) 168/100 (!) 142/70  Pulse: 88   Resp: 20   Temp: 98.5 F (36.9 C)   TempSrc: Oral   SpO2: 90%   Weight: 230 lb (104.3 kg)   Height: 5\' 1"  (1.549 m)       Assessment & Plan:

## 2016-06-14 LAB — HEPATITIS C ANTIBODY: HCV AB: NEGATIVE

## 2016-06-14 LAB — THEOPHYLLINE LEVEL: THEOPHYLLINE LVL: 11.6 mg/L (ref 10.0–20.0)

## 2016-06-15 ENCOUNTER — Other Ambulatory Visit: Payer: Self-pay | Admitting: Internal Medicine

## 2016-06-18 ENCOUNTER — Telehealth: Payer: Self-pay | Admitting: *Deleted

## 2016-06-18 MED ORDER — POTASSIUM CHLORIDE CRYS ER 20 MEQ PO TBCR: EXTENDED_RELEASE_TABLET | ORAL | 1 refills | Status: DC

## 2016-06-18 NOTE — Telephone Encounter (Signed)
Pt left msg on triage stating her potassium should have went to [facility], and not [facility]. Requesting rx to be resent to mail service. Resent to [facility]...Johny Chess

## 2016-06-19 ENCOUNTER — Other Ambulatory Visit: Payer: Self-pay | Admitting: *Deleted

## 2016-06-19 DIAGNOSIS — R609 Edema, unspecified: Secondary | ICD-10-CM

## 2016-06-24 NOTE — Progress Notes (Signed)
Subjective:   Lisa Hamilton is a 70 y.o. female who presents for Medicare Annual (Subsequent) preventive examination.  The Patient was informed that the wellness visit is to identify future health risk and educate and initiate measures that can reduce risk for increased disease through the lifespan.   Describes health as fair, good or great? "fair at the best"  Review of Systems:  No ROS.  Medicare Wellness Visit.  Cardiac Risk Factors include: dyslipidemia;obesity (BMI >30kg/m2);advanced age (>50men, >31 women);sedentary lifestyle   Sleep patterns: sleeps 3-4 hours d/t RLS. Up to void at least 2 times.    Home Safety/Smoke Alarms: smoke detectors and carbon monoxide in place.  Living environment; residence and Firearm Safety: Lives with husband in single story home. Firearms locked in safe location.  Seat Belt Safety/Bike Helmet: Wears seat belt   Counseling:   Eye Exam- Last exam 11/2015, followed yearly by [facility]. H/O Glaucoma and Cataract surgery. Followed every 6 months by Dr. Posey Pronto (retina specialist) Dental-Last exam 05/2016, every 6 months by Dr. Melina Copa  Female:   Pap-05/20/13. Dr. Gertie Fey       Mammo-05/05/15, negative. Scheduled for 06/26/16.        Dexa scan-06/13/15,  Osteoporosis. Recall 2 years. Taking Calcium and Vitamin D supplement.        CCS-Colonoscopy 11/29/14, polyps. Recall 3 years.        Objective:     Vitals: BP 122/60 (BP Location: Left Arm, Patient Position: Sitting, Cuff Size: Normal)   Pulse 90   Ht 5\' 1"  (1.549 m)   Wt 232 lb 0.6 oz (105.3 kg)   SpO2 97%   BMI 43.84 kg/m   Body mass index is 43.84 kg/m.   Tobacco History  Smoking Status  . Never Smoker  Smokeless Tobacco  . Never Used     Counseling given: Not Answered   Past Medical History:  Diagnosis Date  . Allergy   . Asthmatic bronchitis   . Cancer (HCC)    skin cancer- squamous cell- scalp, right wrist  . Constipation    chronic  . COPD (chronic obstructive  pulmonary disease) ([place])   . Diverticulosis of colon   . DJD (degenerative joint disease)   . Glaucoma 1977   EYE SURGERY  FOR TX OF GLAUCOMA--NO LONGER HAS GLAUCOMA  . Hepatic cyst    s/p surgical intervention 05/2011 -[place]  . Hiatal hernia   . Hx of colonic polyps   . Hypertension   . Kidney stone   . Lipoma   . Medullary sponge kidney   . Nontoxic multinodular goiter    pt unsure of this  . Obesity   . Osteoporosis   . Renal calculus    s/p lithotripsy 06/2011  . Restless leg syndrome   . Shingles 2009  . Venous insufficiency   . Vitamin D deficiency    Past Surgical History:  Procedure Laterality Date  . bilateral carpel tunnel repairs     Dr. Shellia Carwin  left--1999   right--2008  . bilateral wrist fracture, right  1996   left 2001, Dr. Daylene Katayama  . CATARACT EXTRACTION  04/07/13 R, 05/18/13 L   [place]  . COLONOSCOPY    . eye surgery Left 11/22/2014   "MD pucker" repair  . [place]  . [place]  . HERNIA REPAIR  2012   umb hernia  . LAPAROSCOPIC LIVER CYST FENESTRATION  05/2011  . LIPOMA EXCISION  08/13/2012   Procedure: EXCISION  LIPOMA;  Surgeon: Magnus Sinning, MD;  Location: [facility];  Service: Orthopedics;  Laterality: Left;  . SHOULDER OPEN ROTATOR CUFF REPAIR  08/13/2012   Procedure: ROTATOR CUFF REPAIR SHOULDER OPEN;  Surgeon: Magnus Sinning, MD;  Location: [facility];  Service: Orthopedics;  Laterality: Left;  LEFT SHOULDER ANTERIOR ACROMINECTOMY AND ROTATOR CUFF REPAIR AND EXCISION LEFT SHOULDER LIPOMA  . SQUAMOUS CELL CARCINOMA EXCISION  2010   L [place]  . TONSILLECTOMY  1967   Family History  Problem Relation Age of Onset  . Lung cancer Father   . Colon cancer Father     possible colon cancer, but unsure  . Lung cancer Sister   . Irritable bowel syndrome Sister   . Obstructive Sleep Apnea Sister   . Osteoarthritis Sister   . Esophageal cancer Neg Hx   . Rectal cancer Neg Hx   . Stomach cancer Neg Hx    History  Sexual  Activity  . Sexual activity: Not on file    Outpatient Encounter Prescriptions as of 06/25/2016  Medication Sig  . albuterol (PROVENTIL) (2.5 MG/3ML) 0.083% nebulizer solution USE 1 VIAL VIA NEBULIZER FOUR TIMES DAILY  . aspirin 81 MG tablet Take 2 tablets by mouth at bedtime  . bisacodyl (DULCOLAX) 5 MG EC tablet Take 5 mg by mouth daily as needed. For constipation  . Calcium Carbonate-Vitamin D 600-400 MG-UNIT per tablet Take 1 tablet by mouth 3 (three) times daily.  . Cholecalciferol (VITAMIN D) 1000 UNITS capsule Take 1,000 Units by mouth daily. VITAMIN D 3  . clonazePAM (KLONOPIN) 0.5 MG tablet 1-3 taken 30 minutes before bed  . Co-Enzyme Q-10 100 MG CAPS Take 200 mg by mouth daily.   Marland Kitchen docusate sodium (COLACE) 100 MG capsule Take 200 mg by mouth at bedtime as needed. For constipation  . esomeprazole (NEXIUM) 20 MG capsule Take 20 mg by mouth daily at 12 noon.  . Fluticasone-Salmeterol (ADVAIR DISKUS) 500-50 MCG/DOSE AEPB Inhale 1 puff into the lungs every 12 (twelve) hours.  . furosemide (LASIX) 40 MG tablet TAKE 1 TABLET BY MOUTH 3  TIMES DAILY  . gabapentin (NEURONTIN) 300 MG capsule Take 1 capsule (300 mg total) by mouth 3 (three) times daily.  Marland Kitchen guaiFENesin (MUCINEX) 600 MG 12 hr tablet Take 600 mg by mouth 2 (two) times daily as needed. Reported on 12/22/2015  . ibuprofen (ADVIL,MOTRIN) 200 MG tablet Take 200 mg by mouth every 6 (six) hours as needed.  Astrid Drafts Ultra Strength 1500 MG CAPS Take 1,500 mg by mouth daily.  Marland Kitchen losartan (COZAAR) 100 MG tablet Take 1 tablet (100 mg total) by mouth daily.  . Magnesium 400 MG CAPS Take 400 mg by mouth daily.  . montelukast (SINGULAIR) 10 MG tablet Take 1 tablet by mouth at  bedtime  . Multiple Vitamin (MULTIVITAMIN) capsule Take 1 capsule by mouth daily. Iron free  . potassium chloride SA (K-DUR,KLOR-CON) 20 MEQ tablet Take one tablet one day then two tablets the next day, alternating every other day.  . pravastatin (PRAVACHOL) 40 MG  tablet Take 1 tablet (40 mg total) by mouth daily.  . Probiotic Product (PROBIOTIC DAILY PO) Take 1 tablet by mouth 2 (two) times daily.  Marland Kitchen rOPINIRole (REQUIP) 0.5 MG tablet Take 5 daily as directed  . theophylline (THEODUR) 300 MG 12 hr tablet Take 1 tablet by mouth two  times daily  . traMADol (ULTRAM) 50 MG tablet Take 1 tablet (50 mg total) by mouth every 8 (eight) hours  as needed for moderate pain or severe pain.  Marland Kitchen triamcinolone cream (KENALOG) 0.1 % Apply 1 application topically 2 (two) times daily.  . [DISCONTINUED] theophylline (THEODUR) 300 MG 12 hr tablet TAKE 1 TABLET BY MOUTH TWO  TIMES DAILY   No facility-administered encounter medications on file as of 06/25/2016.     Activities of Daily Living In your present state of health, do you have any difficulty performing the following activities: 06/25/2016  Hearing? N  Vision? N  Difficulty concentrating or making decisions? N  Walking or climbing stairs? Y  Dressing or bathing? N  Doing errands, shopping? N  Preparing Food and eating ? N  Using the Toilet? N  In the past six months, have you accidently leaked urine? N  Do you have problems with loss of bowel control? N  Managing your Medications? N  Managing your Finances? N  Housekeeping or managing your Housekeeping? N  Some recent data might be hidden    Patient Care Team: Hoyt Koch, MD as PCP - General (Internal Medicine) Irine Seal, MD (Urology) Rolm Bookbinder, MD (Dermatology) Rutherford Guys, MD (Ophthalmology) Max Villa Herb, DPM (Podiatry) Deneise Lever, MD (Pulmonary Disease) Milus Banister, MD (Gastroenterology) Jerelyn Charles, MD as Consulting Physician (Obstetrics) Noralee Space, MD as Consulting Physician (Pulmonary Disease) Magnus Sinning, MD as Consulting Physician (Orthopedic Surgery)    Assessment:    Physical assessment deferred to PCP.  Exercise Activities and Dietary recommendations Current Exercise Habits: The patient does not  participate in regular exercise at present, Exercise limited by: orthopedic condition(s);respiratory conditions(s) (RLS, arthritis, sciatica and swelling limits exercise)   Diet (meal preparation, eat out, water intake, caffeinated beverages, dairy products, fruits and vegetables): Prepares own meals. Drinks water and diet pepsi. Dessert in the evening.   Breakfast: Bacon, egg, toast, jelly, fruit. Coffee (3 cups/week) Lunch: Fast food taco salad, sandwich, chips Dinner: Steamed shrimp, baked beans, baked potato.     Encouraged patient to increase water intake. Discussed heart healthy diet. Nutrition information emailed via [facility] portal.  Discussed increasing activity as tolerated after clearance from Vascular specialist.    Goals      Patient Stated   . <enter goal here> (pt-stated)          Get my legs in shape to be able to move without limitation. Seeing vascular/vein specialist soon for evaluation.       Fall Risk Fall Risk  06/25/2016 08/30/2015 06/07/2014  Falls in the past year? No Yes No  Number falls in past yr: - 1 -  Injury with Fall? - No -   Depression Screen PHQ 2/9 Scores 06/25/2016 08/30/2015 06/07/2014 06/02/2013  PHQ - 2 Score 0 0 0 1     Cognitive Function MMSE - Mini Mental State Exam 06/25/2016  Orientation to time 5  Orientation to Place 5  Registration 3  Attention/ Calculation 5  Recall 3  Language- name 2 objects 2  Language- repeat 1  Language- follow 3 step command 3  Language- read & follow direction 1  Write a sentence 1  Copy design 1  Total score 30         Immunization History  Administered Date(s) Administered  . H1N1 07/27/2008  . Influenza Split 05/16/2011  . Influenza, High Dose Seasonal PF 06/13/2015, 05/11/2016  . Influenza,inj,Quad PF,36+ Mos 06/02/2013, 06/07/2014  . Pneumococcal Conjugate-13 01/04/2014  . Pneumococcal Polysaccharide-23 05/15/2012  . Td 09/26/2009  . Tdap 06/07/2014  . Zoster 01/09/2013  Screening  Tests Health Maintenance  Topic Date Due  . MAMMOGRAM  05/04/2017  . PNA vac Low Risk Adult (2 of 2 - PPSV23) 05/15/2017  . COLONOSCOPY  11/28/2017  . TETANUS/TDAP  06/07/2024  . INFLUENZA VACCINE  Completed  . DEXA SCAN  Completed  . ZOSTAVAX  Completed  . Hepatitis C Screening  Completed      Plan:     Eat heart healthy diet (full of fruits, vegetables, whole grains, lean protein, water--limit salt, fat, and sugar intake) and increase physical activity as tolerated.  Continue doing brain stimulating activities (puzzles, reading, adult coloring books, staying active) to keep memory sharp.   Bring a copy of your advanced directives to your next office visit.  Call orthopedic surgeon to schedule appt with new provider.   *patient concerns: sciatica pain, leg swelling and balance. Patient plans to contact orthopedic surgeon regarding sciatica pain. Discussed possibility of PT (after vascular appt) to assist with balance.    During the course of the visit the patient was educated and counseled about the following appropriate screening and preventive services:   Vaccines to include Pneumoccal, Influenza, Hepatitis B, Td, Zostavax, HCV  Cardiovascular Disease  Colorectal cancer screening  Bone density screening  Diabetes screening  Glaucoma screening  Mammography/PAP  Nutrition counseling   Patient Instructions (the written plan) was given to the patient.   Gerilyn Nestle, RN  06/25/2016

## 2016-06-24 NOTE — Progress Notes (Signed)
Pre visit review using our clinic review tool, if applicable. No additional management support is needed unless otherwise documented below in the visit note. 

## 2016-06-25 ENCOUNTER — Telehealth: Payer: Self-pay

## 2016-06-25 ENCOUNTER — Ambulatory Visit (INDEPENDENT_AMBULATORY_CARE_PROVIDER_SITE_OTHER): Payer: Medicare Other

## 2016-06-25 VITALS — BP 122/60 | HR 90 | Ht 61.0 in | Wt 232.0 lb

## 2016-06-25 DIAGNOSIS — Z0000 Encounter for general adult medical examination without abnormal findings: Secondary | ICD-10-CM

## 2016-06-25 MED ORDER — TRIAMCINOLONE ACETONIDE 0.1 % EX CREA
1.0000 | TOPICAL_CREAM | Freq: Two times a day (BID) | CUTANEOUS | 3 refills | Status: DC
Start: 2016-06-25 — End: 2018-07-14

## 2016-06-25 NOTE — Patient Instructions (Addendum)
Eat heart healthy diet (full of fruits, vegetables, whole grains, lean protein, water--limit salt, fat, and sugar intake) and increase physical activity as tolerated.  Continue doing brain stimulating activities (puzzles, reading, adult coloring books, staying active) to keep memory sharp.   Bring a copy of your advanced directives to your next office visit.  Call orthopedic surgeon to schedule appt with new provider.    Fat and Cholesterol Restricted Diet Introduction Getting too much fat and cholesterol in your diet may cause health problems. Following this diet helps keep your fat and cholesterol at normal levels. This can keep you from getting sick. What types of fat should I choose?  Choose monosaturated and polyunsaturated fats. These are found in foods such as olive oil, canola oil, flaxseeds, walnuts, almonds, and seeds.  Eat more omega-3 fats. Good choices include salmon, mackerel, sardines, tuna, flaxseed oil, and ground flaxseeds.  Limit saturated fats. These are in animal products such as meats, butter, and cream. They can also be in plant products such as palm oil, palm kernel oil, and coconut oil.  Avoid foods with partially hydrogenated oils in them. These contain trans fats. Examples of foods that have trans fats are stick margarine, some tub margarines, cookies, crackers, and other baked goods. What general guidelines do I need to follow?  Check food labels. Look for the words "trans fat" and "saturated fat."  When preparing a meal:  Fill half of your plate with vegetables and green salads.  Fill one fourth of your plate with whole grains. Look for the word "whole" as the first word in the ingredient list.  Fill one fourth of your plate with lean protein foods.  Eat more foods that have fiber, like apples, carrots, beans, peas, and barley.  Eat more home-cooked foods. Eat less at restaurants and buffets.  Limit or avoid alcohol.  Limit foods high in starch and  sugar.  Limit fried foods.  Cook foods without frying them. Baking, boiling, grilling, and broiling are all great options.  Lose weight if you are overweight. Losing even a small amount of weight can help your overall health. It can also help prevent diseases such as diabetes and heart disease. What foods can I eat? Grains  Whole grains, such as whole wheat or whole grain breads, crackers, cereals, and pasta. Unsweetened oatmeal, bulgur, barley, quinoa, or brown rice. Corn or whole wheat flour tortillas. Vegetables  Fresh or frozen vegetables (raw, steamed, roasted, or grilled). Green salads. Fruits  All fresh, canned (in natural juice), or frozen fruits. Meat and Other Protein Products  Ground beef (85% or leaner), grass-fed beef, or beef trimmed of fat. Skinless chicken or [place]. Ground chicken or [place]. Pork trimmed of fat. All fish and seafood. Eggs. Dried beans, peas, or lentils. Unsalted nuts or seeds. Unsalted canned or dry beans. Dairy  Low-fat dairy products, such as skim or 1% milk, 2% or reduced-fat cheeses, low-fat ricotta or cottage cheese, or plain low-fat yogurt. Fats and Oils  Tub margarines without trans fats. Light or reduced-fat mayonnaise and salad dressings. Avocado. Olive, canola, sesame, or safflower oils. Natural peanut or almond butter (choose ones without added sugar and oil). The items listed above may not be a complete list of recommended foods or beverages. Contact your dietitian for more options.  What foods are not recommended? Grains  White bread. White pasta. White rice. Cornbread. Bagels, pastries, and croissants. Crackers that contain trans fat. Vegetables  White potatoes. Corn. Creamed or fried vegetables. Vegetables in a  cheese sauce. Fruits  Dried fruits. Canned fruit in light or heavy syrup. Fruit juice. Meat and Other Protein Products  Fatty cuts of meat. Ribs, chicken wings, bacon, sausage, bologna, salami, chitterlings, fatback, hot dogs,  bratwurst, and packaged luncheon meats. Liver and organ meats. Dairy  Whole or 2% milk, cream, half-and-half, and cream cheese. Whole milk cheeses. Whole-fat or sweetened yogurt. Full-fat cheeses. Nondairy creamers and whipped toppings. Processed cheese, cheese spreads, or cheese curds. Sweets and Desserts  Corn syrup, sugars, honey, and molasses. Candy. Jam and jelly. Syrup. Sweetened cereals. Cookies, pies, cakes, donuts, muffins, and ice cream. Fats and Oils  Butter, stick margarine, lard, shortening, ghee, or bacon fat. Coconut, palm kernel, or palm oils. Beverages  Alcohol. Sweetened drinks (such as sodas, lemonade, and fruit drinks or punches). The items listed above may not be a complete list of foods and beverages to avoid. Contact your dietitian for more information.  This information is not intended to replace advice given to you by your health care provider. Make sure you discuss any questions you have with your health care provider. Document Released: 01/21/2012 Document Revised: 03/28/2016 Document Reviewed: 10/21/2013  2017 [employer]   Fall Prevention in the Home Introduction Falls can cause injuries. They can happen to people of all ages. There are many things you can do to make your home safe and to help prevent falls. What can I do on the outside of my home?  Regularly fix the edges of walkways and driveways and fix any cracks.  Remove anything that might make you trip as you walk through a door, such as a raised step or threshold.  Trim any bushes or trees on the path to your home.  Use bright outdoor lighting.  Clear any walking paths of anything that might make someone trip, such as rocks or tools.  Regularly check to see if handrails are loose or broken. Make sure that both sides of any steps have handrails.  Any raised decks and porches should have guardrails on the edges.  Have any leaves, snow, or ice cleared regularly.  Use sand or salt on walking paths  during winter.  Clean up any spills in your garage right away. This includes oil or grease spills. What can I do in the bathroom?  Use night lights.  Install grab bars by the toilet and in the tub and shower. Do not use towel bars as grab bars.  Use non-skid mats or decals in the tub or shower.  If you need to sit down in the shower, use a plastic, non-slip stool.  Keep the floor dry. Clean up any water that spills on the floor as soon as it happens.  Remove soap buildup in the tub or shower regularly.  Attach bath mats securely with double-sided non-slip rug tape.  Do not have throw rugs and other things on the floor that can make you trip. What can I do in the bedroom?  Use night lights.  Make sure that you have a light by your bed that is easy to reach.  Do not use any sheets or blankets that are too big for your bed. They should not hang down onto the floor.  Have a firm chair that has side arms. You can use this for support while you get dressed.  Do not have throw rugs and other things on the floor that can make you trip. What can I do in the kitchen?  Clean up any spills right away.  Avoid  walking on wet floors.  Keep items that you use a lot in easy-to-reach places.  If you need to reach something above you, use a strong step stool that has a grab bar.  Keep electrical cords out of the way.  Do not use floor polish or wax that makes floors slippery. If you must use wax, use non-skid floor wax.  Do not have throw rugs and other things on the floor that can make you trip. What can I do with my stairs?  Do not leave any items on the stairs.  Make sure that there are handrails on both sides of the stairs and use them. Fix handrails that are broken or loose. Make sure that handrails are as long as the stairways.  Check any carpeting to make sure that it is firmly attached to the stairs. Fix any carpet that is loose or worn.  Avoid having throw rugs at the top  or bottom of the stairs. If you do have throw rugs, attach them to the floor with carpet tape.  Make sure that you have a light switch at the top of the stairs and the bottom of the stairs. If you do not have them, ask someone to add them for you. What else can I do to help prevent falls?  Wear shoes that:  Do not have high heels.  Have rubber bottoms.  Are comfortable and fit you well.  Are closed at the toe. Do not wear sandals.  If you use a stepladder:  Make sure that it is fully opened. Do not climb a closed stepladder.  Make sure that both sides of the stepladder are locked into place.  Ask someone to hold it for you, if possible.  Clearly mark and make sure that you can see:  Any grab bars or handrails.  First and last steps.  Where the edge of each step is.  Use tools that help you move around (mobility aids) if they are needed. These include:  Canes.  Walkers.  Scooters.  Crutches.  Turn on the lights when you go into a dark area. Replace any light bulbs as soon as they burn out.  Set up your furniture so you have a clear path. Avoid moving your furniture around.  If any of your floors are uneven, fix them.  If there are any pets around you, be aware of where they are.  Review your medicines with your doctor. Some medicines can make you feel dizzy. This can increase your chance of falling. Ask your doctor what other things that you can do to help prevent falls. This information is not intended to replace advice given to you by your health care provider. Make sure you discuss any questions you have with your health care provider. Document Released: 05/18/2009 Document Revised: 12/28/2015 Document Reviewed: 08/26/2014  2017 [employer]  Health Maintenance, Female Introduction Adopting a healthy lifestyle and getting preventive care can go a long way to promote health and wellness. Talk with your health care provider about what schedule of regular  examinations is right for you. This is a good chance for you to check in with your provider about disease prevention and staying healthy. In between checkups, there are plenty of things you can do on your own. Experts have done a lot of research about which lifestyle changes and preventive measures are most likely to keep you healthy. Ask your health care provider for more information. Weight and diet Eat a healthy diet  Be sure  to include plenty of vegetables, fruits, low-fat dairy products, and lean protein.  Do not eat a lot of foods high in solid fats, added sugars, or salt.  Get regular exercise. This is one of the most important things you can do for your health.  Most adults should exercise for at least 150 minutes each week. The exercise should increase your heart rate and make you sweat (moderate-intensity exercise).  Most adults should also do strengthening exercises at least twice a week. This is in addition to the moderate-intensity exercise. Maintain a healthy weight  Body mass index (BMI) is a measurement that can be used to identify possible weight problems. It estimates body fat based on height and weight. Your health care provider can help determine your BMI and help you achieve or maintain a healthy weight.  For females 60 years of age and older:  A BMI below 18.5 is considered underweight.  A BMI of 18.5 to 24.9 is normal.  A BMI of 25 to 29.9 is considered overweight.  A BMI of 30 and above is considered obese. Watch levels of cholesterol and blood lipids  You should start having your blood tested for lipids and cholesterol at 69 years of age, then have this test every 5 years.  You may need to have your cholesterol levels checked more often if:  Your lipid or cholesterol levels are high.  You are older than 69 years of age.  You are at high risk for heart disease. Cancer screening Lung Cancer  Lung cancer screening is recommended for adults 84-80 years  old who are at high risk for lung cancer because of a history of smoking.  A yearly low-dose CT scan of the lungs is recommended for people who:  Currently smoke.  Have quit within the past 15 years.  Have at least a 30-pack-year history of smoking. A pack year is smoking an average of one pack of cigarettes a day for 1 year.  Yearly screening should continue until it has been 15 years since you quit.  Yearly screening should stop if you develop a health problem that would prevent you from having lung cancer treatment. Breast Cancer  Practice breast self-awareness. This means understanding how your breasts normally appear and feel.  It also means doing regular breast self-exams. Let your health care provider know about any changes, no matter how small.  If you are in your 20s or 30s, you should have a clinical breast exam (CBE) by a health care provider every 1-3 years as part of a regular health exam.  If you are 45 or older, have a CBE every year. Also consider having a breast X-ray (mammogram) every year.  If you have a family history of breast cancer, talk to your health care provider about genetic screening.  If you are at high risk for breast cancer, talk to your health care provider about having an MRI and a mammogram every year.  Breast cancer gene (BRCA) assessment is recommended for women who have family members with BRCA-related cancers. BRCA-related cancers include:  Breast.  Ovarian.  Tubal.  Peritoneal cancers.  Results of the assessment will determine the need for genetic counseling and BRCA1 and BRCA2 testing. Cervical Cancer  Your health care provider may recommend that you be screened regularly for cancer of the pelvic organs (ovaries, uterus, and vagina). This screening involves a pelvic examination, including checking for microscopic changes to the surface of your cervix (Pap test). You may be encouraged to have  this screening done every 3 years, beginning  at age 82.  For women ages 53-65, health care providers may recommend pelvic exams and Pap testing every 3 years, or they may recommend the Pap and pelvic exam, combined with testing for human papilloma virus (HPV), every 5 years. Some types of HPV increase your risk of cervical cancer. Testing for HPV may also be done on women of any age with unclear Pap test results.  Other health care providers may not recommend any screening for nonpregnant women who are considered low risk for pelvic cancer and who do not have symptoms. Ask your health care provider if a screening pelvic exam is right for you.  If you have had past treatment for cervical cancer or a condition that could lead to cancer, you need Pap tests and screening for cancer for at least 20 years after your treatment. If Pap tests have been discontinued, your risk factors (such as having a new sexual partner) need to be reassessed to determine if screening should resume. Some women have medical problems that increase the chance of getting cervical cancer. In these cases, your health care provider may recommend more frequent screening and Pap tests. Colorectal Cancer  This type of cancer can be detected and often prevented.  Routine colorectal cancer screening usually begins at 69 years of age and continues through 69 years of age.  Your health care provider may recommend screening at an earlier age if you have risk factors for colon cancer.  Your health care provider may also recommend using home test kits to check for hidden blood in the stool.  A small camera at the end of a tube can be used to examine your colon directly (sigmoidoscopy or colonoscopy). This is done to check for the earliest forms of colorectal cancer.  Routine screening usually begins at age 68.  Direct examination of the colon should be repeated every 5-10 years through 69 years of age. However, you may need to be screened more often if early forms of precancerous  polyps or small growths are found. Skin Cancer  Check your skin from head to toe regularly.  Tell your health care provider about any new moles or changes in moles, especially if there is a change in a mole's shape or color.  Also tell your health care provider if you have a mole that is larger than the size of a pencil eraser.  Always use sunscreen. Apply sunscreen liberally and repeatedly throughout the day.  Protect yourself by wearing long sleeves, pants, a wide-brimmed hat, and sunglasses whenever you are outside. Heart disease, diabetes, and high blood pressure  High blood pressure causes heart disease and increases the risk of stroke. High blood pressure is more likely to develop in:  People who have blood pressure in the high end of the normal range (130-139/85-89 mm Hg).  People who are overweight or obese.  People who are African American.  If you are 67-62 years of age, have your blood pressure checked every 3-5 years. If you are 67 years of age or older, have your blood pressure checked every year. You should have your blood pressure measured twice-once when you are at a hospital or clinic, and once when you are not at a hospital or clinic. Record the average of the two measurements. To check your blood pressure when you are not at a hospital or clinic, you can use:  An automated blood pressure machine at a pharmacy.  A home blood pressure  monitor.  If you are between 24 years and 72 years old, ask your health care provider if you should take aspirin to prevent strokes.  Have regular diabetes screenings. This involves taking a blood sample to check your fasting blood sugar level.  If you are at a normal weight and have a low risk for diabetes, have this test once every three years after 69 years of age.  If you are overweight and have a high risk for diabetes, consider being tested at a younger age or more often. Preventing infection Hepatitis B  If you have a higher  risk for hepatitis B, you should be screened for this virus. You are considered at high risk for hepatitis B if:  You were born in a country where hepatitis B is common. Ask your health care provider which countries are considered high risk.  Your parents were born in a high-risk country, and you have not been immunized against hepatitis B (hepatitis B vaccine).  You have HIV or AIDS.  You use needles to inject street drugs.  You live with someone who has hepatitis B.  You have had sex with someone who has hepatitis B.  You get hemodialysis treatment.  You take certain medicines for conditions, including cancer, organ transplantation, and autoimmune conditions. Hepatitis C  Blood testing is recommended for:  Everyone born from [birth year] through 1964.  Anyone with known risk factors for hepatitis C. Sexually transmitted infections (STIs)  You should be screened for sexually transmitted infections (STIs) including gonorrhea and chlamydia if:  You are sexually active and are younger than 69 years of age.  You are older than 69 years of age and your health care provider tells you that you are at risk for this type of infection.  Your sexual activity has changed since you were last screened and you are at an increased risk for chlamydia or gonorrhea. Ask your health care provider if you are at risk.  If you do not have HIV, but are at risk, it may be recommended that you take a prescription medicine daily to prevent HIV infection. This is called pre-exposure prophylaxis (PrEP). You are considered at risk if:  You are sexually active and do not regularly use condoms or know the HIV status of your partner(s).  You take drugs by injection.  You are sexually active with a partner who has HIV. Talk with your health care provider about whether you are at high risk of being infected with HIV. If you choose to begin PrEP, you should first be tested for HIV. You should then be tested every 3  months for as long as you are taking PrEP. Pregnancy  If you are premenopausal and you may become pregnant, ask your health care provider about preconception counseling.  If you may become pregnant, take 400 to 800 micrograms (mcg) of folic acid every day.  If you want to prevent pregnancy, talk to your health care provider about birth control (contraception). Osteoporosis and menopause  Osteoporosis is a disease in which the bones lose minerals and strength with aging. This can result in serious bone fractures. Your risk for osteoporosis can be identified using a bone density scan.  If you are 70 years of age or older, or if you are at risk for osteoporosis and fractures, ask your health care provider if you should be screened.  Ask your health care provider whether you should take a calcium or vitamin D supplement to lower your risk for osteoporosis.  Menopause may have certain physical symptoms and risks.  Hormone replacement therapy may reduce some of these symptoms and risks. Talk to your health care provider about whether hormone replacement therapy is right for you. Follow these instructions at home:  Schedule regular health, dental, and eye exams.  Stay current with your immunizations.  Do not use any tobacco products including cigarettes, chewing tobacco, or electronic cigarettes.  If you are pregnant, do not drink alcohol.  If you are breastfeeding, limit how much and how often you drink alcohol.  Limit alcohol intake to no more than 1 drink per day for nonpregnant women. One drink equals 12 ounces of beer, 5 ounces of wine, or 1 ounces of hard liquor.  Do not use street drugs.  Do not share needles.  Ask your health care provider for help if you need support or information about quitting drugs.  Tell your health care provider if you often feel depressed.  Tell your health care provider if you have ever been abused or do not feel safe at home. This information is  not intended to replace advice given to you by your health care provider. Make sure you discuss any questions you have with your health care provider. Document Released: 02/04/2011 Document Revised: 12/28/2015 Document Reviewed: 04/25/2015  2017 [employer]

## 2016-06-25 NOTE — Telephone Encounter (Signed)
Sent in

## 2016-06-25 NOTE — Progress Notes (Signed)
Medical screening examination/treatment/procedure(s) were performed by non-physician practitioner and as supervising physician I was immediately available for consultation/collaboration. I agree with above. Elizabeth A Crawford, MD 

## 2016-06-25 NOTE — Telephone Encounter (Signed)
Patient in this morning for AWV, requesting refill for Triamcinolone cream to [facility] in [place] please.

## 2016-06-26 ENCOUNTER — Encounter: Payer: Self-pay | Admitting: Internal Medicine

## 2016-06-26 DIAGNOSIS — Z1231 Encounter for screening mammogram for malignant neoplasm of breast: Secondary | ICD-10-CM | POA: Diagnosis not present

## 2016-07-25 ENCOUNTER — Encounter: Payer: Self-pay | Admitting: Vascular Surgery

## 2016-08-01 ENCOUNTER — Other Ambulatory Visit: Payer: Self-pay

## 2016-08-01 ENCOUNTER — Other Ambulatory Visit: Payer: Self-pay | Admitting: Internal Medicine

## 2016-08-01 MED ORDER — ROPINIROLE HCL 0.5 MG PO TABS: ORAL_TABLET | ORAL | 0 refills | Status: DC

## 2016-08-07 ENCOUNTER — Ambulatory Visit (INDEPENDENT_AMBULATORY_CARE_PROVIDER_SITE_OTHER): Payer: Medicare Other | Admitting: Vascular Surgery

## 2016-08-07 ENCOUNTER — Ambulatory Visit (HOSPITAL_COMMUNITY)
Admission: RE | Admit: 2016-08-07 | Discharge: 2016-08-07 | Disposition: A | Discharge status: Home / Self Care | Payer: Medicare Other | Source: Ambulatory Visit | Attending: Vascular Surgery | Admitting: Vascular Surgery

## 2016-08-07 ENCOUNTER — Encounter: Payer: Self-pay | Admitting: Vascular Surgery

## 2016-08-07 VITALS — BP 112/64 | HR 98 | Temp 98.1°F | Resp 18 | Ht 61.0 in | Wt 235.0 lb

## 2016-08-07 DIAGNOSIS — R609 Edema, unspecified: Secondary | ICD-10-CM | POA: Diagnosis not present

## 2016-08-07 DIAGNOSIS — I872 Venous insufficiency (chronic) (peripheral): Secondary | ICD-10-CM | POA: Diagnosis not present

## 2016-08-07 NOTE — Progress Notes (Signed)
Patient ID: Lisa Hamilton, female   DOB: September 21, 1945, 71 y.o.   MRN: [id number]  Reason for Consult: New Evaluation (bilateral lower extremity sewlling and pain)   Referred by Hoyt Koch, *  Subjective:     HPI:  Lisa Hamilton is a 71 y.o. female presents for evaluation of bilateral lower extremity swelling. She states that the swelling is been present in her bilateral lower extremity for several years previously only in the left lower extremity. She is also 1 compression stocking although has difficulty with this given that they fall off of her thighs and cause more discomfort. She is also taken diuretics which have helped some in the past but no longer help. She does have significant weight issues had previously been on Weight Watchers which has helped her feel better but never did seem to help the swelling. She does not have history of DVT in either leg does have evidence of varicose veins and significant spider veins throughout the bilateral lower extremity that do not cause her issue. She has pretty previous intervention on liver cyst. She has never been considered for weight loss surgery to her knowledge. She does not do much exercise because of back pain and sciatica and also spent significant time on her feet because of the sciatica and restless leg syndrome which she states are worsened with resting. She does elevate her legs at night with minimal effect on her edema. She presents today with lower extremity reflux evaluation of her bilateral lower extremity.  Past Medical History:  Diagnosis Date  . Allergy   . Asthmatic bronchitis   . Cancer (HCC)    skin cancer- squamous cell- scalp, right wrist  . Constipation    chronic  . COPD (chronic obstructive pulmonary disease) ([place])   . Diverticulosis of colon   . DJD (degenerative joint disease)   . Glaucoma 1977   EYE SURGERY  FOR TX OF GLAUCOMA--NO LONGER HAS GLAUCOMA  . Hepatic cyst    s/p surgical intervention 05/2011  -[place]  . Hiatal hernia   . Hx of colonic polyps   . Hypertension   . Kidney stone   . Lipoma   . Medullary sponge kidney   . Nontoxic multinodular goiter    pt unsure of this  . Obesity   . Osteoporosis   . Renal calculus    s/p lithotripsy 06/2011  . Restless leg syndrome   . Shingles 2009  . Venous insufficiency   . Vitamin D deficiency    Family History  Problem Relation Age of Onset  . Lung cancer Father   . Colon cancer Father     possible colon cancer, but unsure  . Lung cancer Sister   . Irritable bowel syndrome Sister   . Obstructive Sleep Apnea Sister   . Osteoarthritis Sister   . Esophageal cancer Neg Hx   . Rectal cancer Neg Hx   . Stomach cancer Neg Hx    Past Surgical History:  Procedure Laterality Date  . bilateral carpel tunnel repairs     Dr. Shellia Carwin  left--1999   right--2008  . bilateral wrist fracture, right  1996   left 2001, Dr. Daylene Katayama  . CATARACT EXTRACTION  04/07/13 R, 05/18/13 L   [place]  . COLONOSCOPY    . eye surgery Left 11/22/2014   "MD pucker" repair  . [place]  . [place]  . HERNIA REPAIR  2012   umb hernia  . LAPAROSCOPIC  LIVER CYST FENESTRATION  05/2011  . LIPOMA EXCISION  08/13/2012   Procedure: EXCISION LIPOMA;  Surgeon: Magnus Sinning, MD;  Location: [facility];  Service: Orthopedics;  Laterality: Left;  . SHOULDER OPEN ROTATOR CUFF REPAIR  08/13/2012   Procedure: ROTATOR CUFF REPAIR SHOULDER OPEN;  Surgeon: Magnus Sinning, MD;  Location: [facility];  Service: Orthopedics;  Laterality: Left;  LEFT SHOULDER ANTERIOR ACROMINECTOMY AND ROTATOR CUFF REPAIR AND EXCISION LEFT SHOULDER LIPOMA  . SQUAMOUS CELL CARCINOMA EXCISION  2010   L [place]  . TONSILLECTOMY  1967    Short Social History:  Social History  Substance Use Topics  . Smoking status: Never Smoker  . Smokeless tobacco: Never Used  . Alcohol use 0.0 oz/week     Comment: social use    Allergies  Allergen Reactions  . Adhesive [Tape] Other  (See Comments)    blisters  . Codeine Other (See Comments)    nightmares  . Amoxicillin-Pot Clavulanate Rash  . Penicillins Rash    Current Outpatient Prescriptions  Medication Sig Dispense Refill  . albuterol (PROVENTIL) (2.5 MG/3ML) 0.083% nebulizer solution USE 1 VIAL VIA NEBULIZER FOUR TIMES DAILY 1350 mL 1  . aspirin 81 MG tablet Take 2 tablets by mouth at bedtime    . bisacodyl (DULCOLAX) 5 MG EC tablet Take 5 mg by mouth daily as needed. For constipation    . Cholecalciferol (VITAMIN D) 1000 UNITS capsule Take 2,000 Units by mouth daily. VITAMIN D 3    . clonazePAM (KLONOPIN) 0.5 MG tablet 1-3 taken 30 minutes before bed 270 tablet 3  . Co-Enzyme Q-10 100 MG CAPS Take 200 mg by mouth daily.     Marland Kitchen docusate sodium (COLACE) 100 MG capsule Take 200 mg by mouth at bedtime as needed. For constipation    . esomeprazole (NEXIUM) 20 MG capsule Take 20 mg by mouth daily at 12 noon.    . Fluticasone-Salmeterol (ADVAIR DISKUS) 500-50 MCG/DOSE AEPB Inhale 1 puff into the lungs every 12 (twelve) hours. 3 each 2  . furosemide (LASIX) 40 MG tablet TAKE 1 TABLET BY MOUTH 3  TIMES DAILY 270 tablet 2  . gabapentin (NEURONTIN) 300 MG capsule Take 1 capsule (300 mg total) by mouth 3 (three) times daily. 270 capsule 3  . guaiFENesin (MUCINEX) 600 MG 12 hr tablet Take 600 mg by mouth 2 (two) times daily as needed. Reported on 12/22/2015    . ibuprofen (ADVIL,MOTRIN) 200 MG tablet Take 200 mg by mouth every 6 (six) hours as needed.    Astrid Drafts Ultra Strength 1500 MG CAPS Take 1,500 mg by mouth daily.    Marland Kitchen losartan (COZAAR) 100 MG tablet Take 1 tablet (100 mg total) by mouth daily. 90 tablet 3  . Magnesium 400 MG CAPS Take 400 mg by mouth 2 (two) times daily.     . montelukast (SINGULAIR) 10 MG tablet Take 1 tablet by mouth at  bedtime 90 tablet 3  . Multiple Vitamin (MULTIVITAMIN) capsule Take 1 capsule by mouth daily. Iron free    . Multiple Vitamins-Minerals (ICAPS AREDS 2) CAPS Take by mouth 2 (two)  times daily.    . potassium chloride SA (K-DUR,KLOR-CON) 20 MEQ tablet Take one tablet one day then two tablets the next day, alternating every other day. 135 tablet 1  . pravastatin (PRAVACHOL) 40 MG tablet Take 1 tablet (40 mg total) by mouth daily. 90 tablet 3  . Probiotic Product (PROBIOTIC DAILY PO) Take 1 tablet by mouth 2 (  two) times daily.    Marland Kitchen rOPINIRole (REQUIP) 0.5 MG tablet TAKE 5 TABLETS DAILY AS  DIRECTED 450 tablet 0  . theophylline (THEODUR) 300 MG 12 hr tablet Take 1 tablet by mouth two  times daily 180 tablet 2  . traMADol (ULTRAM) 50 MG tablet Take 1 tablet (50 mg total) by mouth every 8 (eight) hours as needed for moderate pain or severe pain. 90 tablet 5  . triamcinolone cream (KENALOG) 0.1 % Apply 1 application topically 2 (two) times daily. 80 g 3  . Calcium Carbonate-Vitamin D 600-400 MG-UNIT per tablet Take 1 tablet by mouth 3 (three) times daily.     No current facility-administered medications for this visit.     Review of Systems  Constitutional:  Constitutional negative. Eyes: Eyes negative.  Respiratory: Positive for wheezing.  Cardiovascular: Positive for leg swelling.  GI: Gastrointestinal negative.  Musculoskeletal: Positive for back pain, leg pain and joint pain.  Skin: Skin negative.  Neurological: Neurological negative. Hematologic: Hematologic/lymphatic negative.  Psychiatric: Psychiatric negative.        Objective:  Objective   Vitals:   08/07/16 1322  BP: 112/64  Pulse: 98  Resp: 18  Temp: 98.1 F (36.7 C)  SpO2: 96%  Weight: 235 lb (106.6 kg)  Height: 5\' 1"  (1.549 m)   Body mass index is 44.4 kg/m.  Physical Exam  Constitutional: She is oriented to person, place, and time. She appears well-developed.  HENT:  Head: Atraumatic.  Eyes: EOM are normal.  Neck: Neck supple.  Cardiovascular: Normal rate.   Pulses:      Radial pulses are 2+ on the right side, and 2+ on the left side.       Dorsalis pedis pulses are 2+ on the right  side, and 2+ on the left side.       Posterior tibial pulses are 2+ on the right side, and 2+ on the left side.  Pulmonary/Chest: Effort normal. No respiratory distress. She has no wheezes.  Abdominal: Soft.  Musculoskeletal:  2+ pitting edema of her ble Evidence of lipidema of her feet  Neurological: She is alert and oriented to person, place, and time.  Skin: Skin is warm and dry.  Psychiatric: She has a normal mood and affect. Her behavior is normal. Judgment and thought content normal.    Data:  Lower extremity reflux studies demonstrates venous incompetence in the right saphenofemoral junction size 0.71 cm. Venous, and inserted a left proximal great saphenous vein at 0.68 cm.     Assessment/Plan:     71 year old female here for evaluation of bilateral lower extremity edema that is likely multifactorial nature. She does have saphenofemoral reflux bilaterally but this is likely not the main cause of her issues. Given this I would not recommend her for saphenous vein ablation at this time. I have recommended retrying thigh-high compression stockings which she has already been fitted for in the past and we will document that today. She is already on medically appropriate diuretics and so I think the best case for her now is to control her weight and exercise. I have recommended consideration for medically supervised dieting and or consideration of weight loss surgery given that this is the greatest risk to her health at this time with her consortium of issues. We did spent significant time talking about her weight loss and how I think this might help. She also has back pain and knee pain that contribute to her issues of lower extremity discomfort at this time.  I will have her follow up when necessary although I told her I would not consider saphenous vein ablation unless her weight was under much better control she demonstrates good understanding she is very thankful for our discussion we will see  her hopefully in the future.     Waynetta Sandy MD [facility]

## 2016-08-16 ENCOUNTER — Encounter: Payer: Self-pay | Admitting: Internal Medicine

## 2016-08-16 ENCOUNTER — Ambulatory Visit (INDEPENDENT_AMBULATORY_CARE_PROVIDER_SITE_OTHER): Payer: Medicare Other | Admitting: Internal Medicine

## 2016-08-16 ENCOUNTER — Telehealth: Payer: Self-pay | Admitting: Internal Medicine

## 2016-08-16 VITALS — BP 118/78 | HR 85 | Ht 61.0 in | Wt 236.0 lb

## 2016-08-16 DIAGNOSIS — G2581 Restless legs syndrome: Secondary | ICD-10-CM | POA: Diagnosis not present

## 2016-08-16 DIAGNOSIS — J449 Chronic obstructive pulmonary disease, unspecified: Secondary | ICD-10-CM | POA: Diagnosis not present

## 2016-08-16 MED ORDER — ALBUTEROL SULFATE (2.5 MG/3ML) 0.083% IN NEBU: INHALATION_SOLUTION | RESPIRATORY_TRACT | 1 refills | Status: DC

## 2016-08-16 MED ORDER — FLUTICASONE-SALMETEROL 232-14 MCG/ACT IN AEPB: 1.0000 | INHALATION_SPRAY | Freq: Two times a day (BID) | RESPIRATORY_TRACT | 3 refills | Status: DC

## 2016-08-16 MED ORDER — COMPRESSOR NEBULIZER MISC: 1.0000 [IU] | Freq: Once | 1 refills | Status: AC

## 2016-08-16 NOTE — Patient Instructions (Signed)
Script printed to see if AirDuo inhaler from [facility] would be cheaper for you than Advair  Script printed for nebulizer solution  Script printed for replacement nebulizer machine to be supplied through DME company  Try taking whole clonazepam after supper, then 1/2 tab at bedtime. See if this controls leg jerks better by morning

## 2016-08-16 NOTE — Telephone Encounter (Signed)
atc [facility], was on hold Xseveral minutes with no option for VM.  Wcb.

## 2016-08-16 NOTE — Progress Notes (Signed)
HPI female never smoker followed for Restless Legs, back pain that disturbs sleep, asthma, complicated by peripheral venous disease, hemochromatosis  ----------------------------------------------------------------------  10/03/2015-71 year old female never smoker followed for Restless Legs, back pain that disturbs sleep, asthma, complicated by peripheral venous disease, hemochromatosis Restless legs get jumpy around 4 PM. She takes her first Requip and gabapentin at 4 PM, takes Requip, gabapentin and one half clonazepam around 8 PM and again at 10:30 PM/bedtime. Occasionally wakes 1:30 or 2 AM to take one half gabapentin and tramadol. Says she gets enough sleep. Chronic peripheral venous insufficiency with peripheral edema being treated by Dr. Sharlet Salina.  08/16/2016- 71 year old female never smoker followed for Restless Legs, back pain that disturbs sleep, asthma, complicated by peripheral venous disease, hemochromatosis FOLLOWS FOR: Pt states the colder weather makes it harder to breathe but uses scarf to cover her nose and mouth when outside. She also continues to use her nebulizer.  Dr Sharlet Salina wants patient to have new neb machince-current one she bought online-needs to establish with local DME for machine and mebs.  Legs frequently wake her now. Using 5 Requip tablets daily, 3 gabapentin's and divided doses clonazepam. Advair controls asthma but is expensive.              ROS-see HPI Constitutional:   No-   weight loss, night sweats, fevers, chills, +fatigue, lassitude. HEENT:   No-  headaches, difficulty swallowing, tooth/dental problems, sore throat,       No-  sneezing, itching, ear ache, nasal congestion, post nasal drip,  CV:  No-   chest pain, orthopnea, PND, swelling in lower extremities, anasarca,                                                       dizziness, palpitations Resp: No-   shortness of breath with exertion or at rest.              No-   productive cough,  No  non-productive cough,  No- coughing up of blood.              No-   change in color of mucus. + wheezing.   Skin: No-   rash or lesions. GI:  No-   heartburn, indigestion, abdominal pain, nausea, vomiting,  GU:     MS:  No-   joint pain or swelling.  No- decreased range of motion.  + back pain. Neuro-     + per HPI Psych:  No- change in mood or affect. No depression or anxiety.  No memory loss.  OBJ- Physical Exam General- Alert, Oriented, Affect-appropriate, Distress- none acute, +obese Skin- rash-none, lesions- none, excoriation- none Lymphadenopathy- none Head- atraumatic            Eyes- Gross vision intact, PERRLA, conjunctivae and secretions clear            Ears- Hearing, canals-normal            Nose- Clear, no-Septal dev, mucus, polyps, erosion, perforation             Throat- Mallampati II-III , mucosa clear , drainage- none, tonsils- atrophic Neck- flexible , trachea midline, no stridor , thyroid nl, carotid no bruit Chest - symmetrical excursion , unlabored           Heart/CV- RRR , no murmur , no gallop  ,  no rub, nl s1 s2                           - JVD- none , edema +1, stasis changes- none, varices +           Lung- clear to P&A, wheeze- none, cough- none , dullness-none, rub- none           Chest wall-  Abd-  Br/ Gen/ Rectal- Not done, not indicated Extrem- cyanosis- none, clubbing, none, atrophy- none, strength- nl, + compression hose Neuro- grossly intact to observation, no unusual restlessness or movement

## 2016-08-17 ENCOUNTER — Other Ambulatory Visit: Payer: Self-pay | Admitting: Internal Medicine

## 2016-08-18 NOTE — Assessment & Plan Note (Signed)
We discussed timing for taking another half tablet of clonazepam in the evening to see if this helps enough with restless legs now. Trigger for her may be her varicose veins. At some point she may need to see a neurologist.

## 2016-08-18 NOTE — Assessment & Plan Note (Signed)
We discussed Advair, cost and alternatives. Plan-during good seasons she can reduce Advair to 1 puff, once daily for maintenance. As comparison we are giving her prescription for AirDuo which she will price through [facility]

## 2016-08-19 NOTE — Telephone Encounter (Signed)
Called [facility], spoke with [facility], needing diagnosis code for Albuterol neb med.  Diagnosis given verbally: Asthma with COPD >> J44.9 Nothing further needed.

## 2016-08-26 ENCOUNTER — Encounter: Payer: Self-pay | Admitting: Podiatry

## 2016-08-26 ENCOUNTER — Ambulatory Visit (INDEPENDENT_AMBULATORY_CARE_PROVIDER_SITE_OTHER): Payer: Medicare Other | Admitting: Podiatry

## 2016-08-26 VITALS — BP 141/88 | HR 99 | Resp 16

## 2016-08-26 DIAGNOSIS — M71572 Other bursitis, not elsewhere classified, left ankle and foot: Secondary | ICD-10-CM

## 2016-08-26 DIAGNOSIS — M7752 Other enthesopathy of left foot: Secondary | ICD-10-CM

## 2016-08-26 DIAGNOSIS — Q828 Other specified congenital malformations of skin: Secondary | ICD-10-CM

## 2016-08-26 NOTE — Progress Notes (Signed)
   Subjective:    Patient ID: Lisa Hamilton, female    DOB: 08/28/1945, 71 y.o.   MRN: [id number]  HPI: She presents today after having last been here for many years with a chief complaint of a painful lesion sub-first metatarsophalangeal joint of the left foot. She states that the wart U cut out seems to be growing back. She is also complaining about pain to the fifth metatarsal base of the left foot.    Review of Systems  Respiratory: Positive for cough.   Musculoskeletal: Positive for arthralgias and gait problem.  All other systems reviewed and are negative.      Objective:   Physical Exam vital signs are stable she is alert and oriented 3 pulses are palpable. Reactive hyperkeratotic lesion sub-first metatarsophalangeal joint of the left foot is prominent and painful palpation. She also has very prominent fifth metatarsals with significant bowing of the lateral aspect of the foot medially/metatarsus abductus prominent fifth metatarsal left through the right.          Assessment & Plan:  Assessment: Bursitis capsulitis fifth met base left. Reactive hyperkeratotic lesions sub-first met left.  Plan: Injected the lateral aspect with Kenalog and local anesthetic today and I also i debrided all reactive hyperkeratotic tissue. Follow-up with her in 1 month or so history.

## 2016-10-08 ENCOUNTER — Other Ambulatory Visit: Payer: Self-pay | Admitting: Internal Medicine

## 2016-10-08 NOTE — Telephone Encounter (Signed)
Ok refill x 1 year

## 2016-10-08 NOTE — Telephone Encounter (Signed)
CY please advise if ok to refill this medication.  Last filled 07/2016 for #[id number].  thanks

## 2016-11-18 DIAGNOSIS — Z961 Presence of intraocular lens: Secondary | ICD-10-CM | POA: Diagnosis not present

## 2016-11-18 DIAGNOSIS — H353131 Nonexudative age-related macular degeneration, bilateral, early dry stage: Secondary | ICD-10-CM | POA: Diagnosis not present

## 2016-11-18 DIAGNOSIS — H35372 Puckering of macula, left eye: Secondary | ICD-10-CM | POA: Diagnosis not present

## 2016-11-19 DIAGNOSIS — H353131 Nonexudative age-related macular degeneration, bilateral, early dry stage: Secondary | ICD-10-CM | POA: Diagnosis not present

## 2016-11-19 DIAGNOSIS — H43813 Vitreous degeneration, bilateral: Secondary | ICD-10-CM | POA: Diagnosis not present

## 2016-11-19 DIAGNOSIS — Z961 Presence of intraocular lens: Secondary | ICD-10-CM | POA: Diagnosis not present

## 2016-11-19 DIAGNOSIS — H401132 Primary open-angle glaucoma, bilateral, moderate stage: Secondary | ICD-10-CM | POA: Diagnosis not present

## 2016-11-19 DIAGNOSIS — H35371 Puckering of macula, right eye: Secondary | ICD-10-CM | POA: Diagnosis not present

## 2016-11-19 DIAGNOSIS — H35453 Secondary pigmentary degeneration, bilateral: Secondary | ICD-10-CM | POA: Diagnosis not present

## 2016-11-19 DIAGNOSIS — H35363 Drusen (degenerative) of macula, bilateral: Secondary | ICD-10-CM | POA: Diagnosis not present

## 2016-11-19 DIAGNOSIS — H35372 Puckering of macula, left eye: Secondary | ICD-10-CM | POA: Diagnosis not present

## 2016-12-02 DIAGNOSIS — N390 Urinary tract infection, site not specified: Secondary | ICD-10-CM | POA: Diagnosis not present

## 2016-12-02 DIAGNOSIS — N3941 Urge incontinence: Secondary | ICD-10-CM | POA: Diagnosis not present

## 2016-12-02 DIAGNOSIS — N200 Calculus of kidney: Secondary | ICD-10-CM | POA: Diagnosis not present

## 2016-12-04 ENCOUNTER — Ambulatory Visit (INDEPENDENT_AMBULATORY_CARE_PROVIDER_SITE_OTHER): Payer: Medicare Other | Admitting: Podiatry

## 2016-12-04 ENCOUNTER — Encounter: Payer: Self-pay | Admitting: Podiatry

## 2016-12-04 DIAGNOSIS — M7752 Other enthesopathy of left foot: Secondary | ICD-10-CM

## 2016-12-04 DIAGNOSIS — Q828 Other specified congenital malformations of skin: Secondary | ICD-10-CM

## 2016-12-04 NOTE — Progress Notes (Signed)
She presents today with chief complaint of pain to the fifth metatarsal base bilateral. She states that after the injections in January she felt much better just until just the other week.  Objective: Vital signs are stable she's alert and oriented 3. She has palpable bursa beneath the fifth metatarsal bases bilateral secondary to metatarsal adductus.  Assessment: Capsulitis and bursitis fifth metatarsal bases bilateral.  Plan: Injected the area today with him on local anesthetic debrided the reactive hyperkeratoses that are present.  Follow-up with her in 3 months

## 2016-12-13 DIAGNOSIS — H35721 Serous detachment of retinal pigment epithelium, right eye: Secondary | ICD-10-CM | POA: Diagnosis not present

## 2016-12-13 DIAGNOSIS — H353131 Nonexudative age-related macular degeneration, bilateral, early dry stage: Secondary | ICD-10-CM | POA: Diagnosis not present

## 2016-12-13 DIAGNOSIS — H35373 Puckering of macula, bilateral: Secondary | ICD-10-CM | POA: Diagnosis not present

## 2016-12-13 DIAGNOSIS — H35363 Drusen (degenerative) of macula, bilateral: Secondary | ICD-10-CM | POA: Diagnosis not present

## 2017-01-14 DIAGNOSIS — N3941 Urge incontinence: Secondary | ICD-10-CM | POA: Diagnosis not present

## 2017-01-27 ENCOUNTER — Other Ambulatory Visit: Payer: Self-pay | Admitting: Internal Medicine

## 2017-02-06 ENCOUNTER — Other Ambulatory Visit: Payer: Self-pay | Admitting: Internal Medicine

## 2017-02-26 DIAGNOSIS — Z85828 Personal history of other malignant neoplasm of skin: Secondary | ICD-10-CM | POA: Diagnosis not present

## 2017-02-26 DIAGNOSIS — I8311 Varicose veins of right lower extremity with inflammation: Secondary | ICD-10-CM | POA: Diagnosis not present

## 2017-02-26 DIAGNOSIS — I8312 Varicose veins of left lower extremity with inflammation: Secondary | ICD-10-CM | POA: Diagnosis not present

## 2017-02-26 DIAGNOSIS — I872 Venous insufficiency (chronic) (peripheral): Secondary | ICD-10-CM | POA: Diagnosis not present

## 2017-02-27 DIAGNOSIS — N3281 Overactive bladder: Secondary | ICD-10-CM | POA: Diagnosis not present

## 2017-03-06 DIAGNOSIS — H35363 Drusen (degenerative) of macula, bilateral: Secondary | ICD-10-CM | POA: Diagnosis not present

## 2017-03-06 DIAGNOSIS — H353130 Nonexudative age-related macular degeneration, bilateral, stage unspecified: Secondary | ICD-10-CM | POA: Diagnosis not present

## 2017-03-06 DIAGNOSIS — H35373 Puckering of macula, bilateral: Secondary | ICD-10-CM | POA: Diagnosis not present

## 2017-03-06 DIAGNOSIS — H43813 Vitreous degeneration, bilateral: Secondary | ICD-10-CM | POA: Diagnosis not present

## 2017-03-06 DIAGNOSIS — H35721 Serous detachment of retinal pigment epithelium, right eye: Secondary | ICD-10-CM | POA: Diagnosis not present

## 2017-03-06 DIAGNOSIS — H35453 Secondary pigmentary degeneration, bilateral: Secondary | ICD-10-CM | POA: Diagnosis not present

## 2017-03-06 DIAGNOSIS — H2640 Unspecified secondary cataract: Secondary | ICD-10-CM | POA: Diagnosis not present

## 2017-03-06 DIAGNOSIS — Z961 Presence of intraocular lens: Secondary | ICD-10-CM | POA: Diagnosis not present

## 2017-03-12 ENCOUNTER — Ambulatory Visit: Payer: Medicare Other | Admitting: Podiatry

## 2017-03-12 ENCOUNTER — Ambulatory Visit (INDEPENDENT_AMBULATORY_CARE_PROVIDER_SITE_OTHER): Payer: Medicare Other | Admitting: Podiatry

## 2017-03-12 ENCOUNTER — Encounter: Payer: Self-pay | Admitting: Podiatry

## 2017-03-12 DIAGNOSIS — Q828 Other specified congenital malformations of skin: Secondary | ICD-10-CM

## 2017-03-12 DIAGNOSIS — M7752 Other enthesopathy of left foot: Secondary | ICD-10-CM | POA: Diagnosis not present

## 2017-03-12 NOTE — Progress Notes (Signed)
She presents today with chief complaint of pain to the plantar aspect of the fifth metatarsal bases bilaterally. She states that V hurts so bad I can hardly walk on them.  Objective: Vital signs are stable she is alert and oriented 3. Pulses are palpable. Palpable bursa beneath the fifth metatarsal base bilaterally she has an adducted foot type resulting in a very prominent fifth metatarsals bilaterally. She also has a palpable soft tissue mass with an overlying reactive hyperkeratotic lesion.  Assessment: Metatarsus adductus with a prominent fifth metatarsal base resulting in reactive hyperkeratosis and bursitis.  Plan: Injected the bursa today with Kenalog and local anesthetic I also degrees no reactive hyperkeratoses. I recommended that she follow-up with [facility] to have a set of orthotics made. They will have to offload the fifth metatarsal base. Shoes may be a better choice for her.

## 2017-03-17 ENCOUNTER — Ambulatory Visit (INDEPENDENT_AMBULATORY_CARE_PROVIDER_SITE_OTHER): Payer: Self-pay | Admitting: Orthotics

## 2017-03-17 DIAGNOSIS — Q828 Other specified congenital malformations of skin: Secondary | ICD-10-CM

## 2017-03-17 DIAGNOSIS — M7752 Other enthesopathy of left foot: Secondary | ICD-10-CM

## 2017-03-17 NOTE — Progress Notes (Signed)
Patient came into today to be cast for Custom Foot Orthotics. Upon recommendation of [name] Patient presents with hx of bursitis, ff adductus, keratosis. Goals are accommodative foot orthotics with sweet spot/offload 5th met heads bilateral and dancer's pad for thinning fat pad and metarsalagia.  Plan vendor [employer]

## 2017-03-27 ENCOUNTER — Other Ambulatory Visit: Payer: Self-pay | Admitting: Internal Medicine

## 2017-03-27 NOTE — Telephone Encounter (Signed)
Faxed script back to pharmacy

## 2017-03-27 NOTE — Telephone Encounter (Signed)
Check [place] registry for last filled date no history on filen pls advise in Dr. Sharlet Salina absence...Lisa Hamilton

## 2017-04-10 ENCOUNTER — Ambulatory Visit: Payer: Medicare Other | Admitting: Orthotics

## 2017-04-10 DIAGNOSIS — N3281 Overactive bladder: Secondary | ICD-10-CM | POA: Diagnosis not present

## 2017-04-10 DIAGNOSIS — Q828 Other specified congenital malformations of skin: Secondary | ICD-10-CM

## 2017-04-10 DIAGNOSIS — R8271 Bacteriuria: Secondary | ICD-10-CM | POA: Diagnosis not present

## 2017-04-10 NOTE — Progress Notes (Signed)
Patient came in today to pick up custom made foot orthotics.  The goals were accomplished and the patient reported no dissatisfaction with said orthotics.  Patient was advised of breakin period and how to report any issues.  Modifications were made in house to further offload 5th met base left and for F/O to fit into [employer] Comfort sandals.   $300 self pay

## 2017-04-20 ENCOUNTER — Other Ambulatory Visit: Payer: Self-pay | Admitting: Family

## 2017-04-20 ENCOUNTER — Other Ambulatory Visit: Payer: Self-pay | Admitting: Internal Medicine

## 2017-04-21 NOTE — Telephone Encounter (Signed)
CY Please advise on refill. Thanks.  

## 2017-04-21 NOTE — Telephone Encounter (Signed)
Ok to refill x 1 year 

## 2017-05-05 ENCOUNTER — Ambulatory Visit: Payer: Medicare Other | Admitting: Internal Medicine

## 2017-05-12 DIAGNOSIS — Z23 Encounter for immunization: Secondary | ICD-10-CM | POA: Diagnosis not present

## 2017-05-22 DIAGNOSIS — D225 Melanocytic nevi of trunk: Secondary | ICD-10-CM | POA: Diagnosis not present

## 2017-05-22 DIAGNOSIS — D2262 Melanocytic nevi of left upper limb, including shoulder: Secondary | ICD-10-CM | POA: Diagnosis not present

## 2017-05-22 DIAGNOSIS — L280 Lichen simplex chronicus: Secondary | ICD-10-CM | POA: Diagnosis not present

## 2017-05-22 DIAGNOSIS — L814 Other melanin hyperpigmentation: Secondary | ICD-10-CM | POA: Diagnosis not present

## 2017-05-22 DIAGNOSIS — D1801 Hemangioma of skin and subcutaneous tissue: Secondary | ICD-10-CM | POA: Diagnosis not present

## 2017-05-22 DIAGNOSIS — Z85828 Personal history of other malignant neoplasm of skin: Secondary | ICD-10-CM | POA: Diagnosis not present

## 2017-05-22 DIAGNOSIS — I872 Venous insufficiency (chronic) (peripheral): Secondary | ICD-10-CM | POA: Diagnosis not present

## 2017-05-22 DIAGNOSIS — L821 Other seborrheic keratosis: Secondary | ICD-10-CM | POA: Diagnosis not present

## 2017-05-22 DIAGNOSIS — I8311 Varicose veins of right lower extremity with inflammation: Secondary | ICD-10-CM | POA: Diagnosis not present

## 2017-05-22 DIAGNOSIS — I8312 Varicose veins of left lower extremity with inflammation: Secondary | ICD-10-CM | POA: Diagnosis not present

## 2017-06-10 ENCOUNTER — Telehealth: Payer: Self-pay | Admitting: Internal Medicine

## 2017-06-10 ENCOUNTER — Other Ambulatory Visit: Payer: Self-pay | Admitting: Internal Medicine

## 2017-06-10 NOTE — Telephone Encounter (Signed)
Refill has been denied pt is over due for annual appt.must see MD for refills./lmb

## 2017-06-10 NOTE — Telephone Encounter (Signed)
Pharmacy called for a refill of her montelukast (SINGULAIR) 10 MG tablet  Please advise  [facility] Reference Number [id number]

## 2017-06-18 ENCOUNTER — Ambulatory Visit (INDEPENDENT_AMBULATORY_CARE_PROVIDER_SITE_OTHER): Payer: Medicare Other | Admitting: Podiatry

## 2017-06-18 ENCOUNTER — Ambulatory Visit: Payer: Medicare Other | Admitting: Podiatry

## 2017-06-18 ENCOUNTER — Encounter: Payer: Self-pay | Admitting: Podiatry

## 2017-06-18 DIAGNOSIS — M7752 Other enthesopathy of left foot: Secondary | ICD-10-CM | POA: Diagnosis not present

## 2017-06-18 DIAGNOSIS — Q828 Other specified congenital malformations of skin: Secondary | ICD-10-CM

## 2017-06-18 NOTE — Progress Notes (Signed)
She presents today for follow-up of the bursitis fifth metatarsal bases bilaterally. Reactive hyperkeratosis was also present. She states that the orthotics seem to only help on today's that I can wear open shoes to go swimming. Otherwise my feet are still killing me.  Objective: Pulses are palpable still has moderate edema to the left lower extremity due to venous insufficiency with history of cellulitis to the left leg. Pain on palpation with fluctuance a fifth metatarsal bases bilaterally mild erythema surrounding these areas but does not appear to be infectious. Reactive hyperkeratosis are also present.  Assessment: Foot deformity resulting in painful fifth metatarsal base bilateral bursitis capsulitis.  Plan: Injected the area today with Kenalog and local anesthetic debris and all reactive hyperkeratosis. Patient is requesting surgical intervention. I am going to refer her to Dr. Amalia Hailey for a surgical consult.

## 2017-06-19 DIAGNOSIS — N952 Postmenopausal atrophic vaginitis: Secondary | ICD-10-CM | POA: Diagnosis not present

## 2017-06-19 DIAGNOSIS — N814 Uterovaginal prolapse, unspecified: Secondary | ICD-10-CM | POA: Diagnosis not present

## 2017-06-19 DIAGNOSIS — N816 Rectocele: Secondary | ICD-10-CM | POA: Diagnosis not present

## 2017-06-19 DIAGNOSIS — N905 Atrophy of vulva: Secondary | ICD-10-CM | POA: Diagnosis not present

## 2017-06-20 MED ORDER — MONTELUKAST SODIUM 10 MG PO TABS: 10.0000 mg | ORAL_TABLET | Freq: Every day | ORAL | 0 refills | Status: DC

## 2017-06-20 NOTE — Telephone Encounter (Signed)
Per office policy sent 30 day to local pharmacy until appt.../lmb  

## 2017-06-20 NOTE — Telephone Encounter (Signed)
Patient already has an appointment set up for 12/4. Can she have a refill she is out of medication.

## 2017-06-20 NOTE — Addendum Note (Signed)
Addended by: Earnstine Regal on: 06/20/2017 09:37 AM   Modules accepted: Orders

## 2017-06-24 ENCOUNTER — Telehealth: Payer: Self-pay | Admitting: Internal Medicine

## 2017-06-24 ENCOUNTER — Other Ambulatory Visit: Payer: Self-pay | Admitting: Internal Medicine

## 2017-06-24 NOTE — Telephone Encounter (Signed)
Reference Number is [id number] Pharmacy called stating the patient requested this montelukast (SINGULAIR) 10 MG tablet  [facility], [facility]

## 2017-06-24 NOTE — Telephone Encounter (Signed)
Pt is overdue for annual appt.Per office policy can only send  30 day to local pharmacy until appt...Lisa Hamilton

## 2017-06-25 ENCOUNTER — Other Ambulatory Visit: Payer: Self-pay

## 2017-06-30 ENCOUNTER — Ambulatory Visit (INDEPENDENT_AMBULATORY_CARE_PROVIDER_SITE_OTHER): Payer: Medicare Other

## 2017-06-30 ENCOUNTER — Other Ambulatory Visit: Payer: Self-pay | Admitting: Podiatry

## 2017-06-30 ENCOUNTER — Ambulatory Visit: Payer: Medicare Other

## 2017-06-30 ENCOUNTER — Ambulatory Visit (INDEPENDENT_AMBULATORY_CARE_PROVIDER_SITE_OTHER): Payer: Medicare Other | Admitting: Podiatry

## 2017-06-30 DIAGNOSIS — M7750 Other enthesopathy of unspecified foot: Secondary | ICD-10-CM

## 2017-06-30 DIAGNOSIS — M7672 Peroneal tendinitis, left leg: Secondary | ICD-10-CM | POA: Diagnosis not present

## 2017-06-30 DIAGNOSIS — M7752 Other enthesopathy of left foot: Secondary | ICD-10-CM

## 2017-06-30 DIAGNOSIS — M898X7 Other specified disorders of bone, ankle and foot: Secondary | ICD-10-CM | POA: Diagnosis not present

## 2017-06-30 DIAGNOSIS — M7751 Other enthesopathy of right foot: Secondary | ICD-10-CM

## 2017-06-30 NOTE — Patient Instructions (Signed)
Pre-Operative Instructions  Congratulations, you have decided to take an important step towards improving your quality of life.  You can be assured that the doctors and staff at [facility] will be with you every step of the way.  Here are some important things you should know:  1. Plan to be at the surgery center/hospital at least 1 (one) hour prior to your scheduled time, unless otherwise directed by the surgical center/hospital staff.  You must have a responsible adult accompany you, remain during the surgery and drive you home.  Make sure you have directions to the surgical center/hospital to ensure you arrive on time. 2. If you are having surgery at [facility] or [facility], you will need a copy of your medical history and physical form from your family physician within one month prior to the date of surgery. We will give you a form for your primary physician to complete.  3. We make every effort to accommodate the date you request for surgery.  However, there are times where surgery dates or times have to be moved.  We will contact you as soon as possible if a change in schedule is required.   4. No aspirin/ibuprofen for one week before surgery.  If you are on aspirin, any non-steroidal anti-inflammatory medications (Mobic, Aleve, Ibuprofen) should not be taken seven (7) days prior to your surgery.  You make take Tylenol for pain prior to surgery.  5. Medications - If you are taking daily heart and blood pressure medications, seizure, reflux, allergy, asthma, anxiety, pain or diabetes medications, make sure you notify the surgery center/hospital before the day of surgery so they can tell you which medications you should take or avoid the day of surgery. 6. No food or drink after midnight the night before surgery unless directed otherwise by surgical center/hospital staff. 7. No alcoholic beverages 24-hours prior to surgery.  No smoking 24-hours prior or 24-hours after  surgery. 8. Wear loose pants or shorts. They should be loose enough to fit over bandages, boots, and casts. 9. Don't wear slip-on shoes. Sneakers are preferred. 10. Bring your boot with you to the surgery center/hospital.  Also bring crutches or a walker if your physician has prescribed it for you.  If you do not have this equipment, it will be provided for you after surgery. 11. If you have not been contacted by the surgery center/hospital by the day before your surgery, call to confirm the date and time of your surgery. 12. Leave-time from work may vary depending on the type of surgery you have.  Appropriate arrangements should be made prior to surgery with your employer. 13. Prescriptions will be provided immediately following surgery by your doctor.  Fill these as soon as possible after surgery and take the medication as directed. Pain medications will not be refilled on weekends and must be approved by the doctor. 14. Remove nail polish on the operative foot and avoid getting pedicures prior to surgery. 15. Wash the night before surgery.  The night before surgery wash the foot and leg well with water and the antibacterial soap provided. Be sure to pay special attention to beneath the toenails and in between the toes.  Wash for at least three (3) minutes. Rinse thoroughly with water and dry well with a towel.  Perform this wash unless told not to do so by your physician.  Enclosed: 1 Ice pack (please put in freezer the night before surgery)   1 Hibiclens skin cleaner     Pre-op instructions  If you have any questions regarding the instructions, please do not hesitate to call our office.  [place]: [address], [place], [place] [address] -- [phone]  [place]: [address] [place], [place] [address] -- [phone]  [place]: [address]  [place], [place] [address] -- [phone]  [place]: [address], [address], [place], [place] [address] -- [phone]  Website:  [email or web address] 

## 2017-06-30 NOTE — Progress Notes (Signed)
Subjective:   Lisa Hamilton is a 71 y.o. female who presents for Medicare Annual (Subsequent) preventive examination.  Review of Systems:  No ROS.  Medicare Wellness Visit. Additional risk factors are reflected in the social history.  Cardiac Risk Factors include: advanced age (>48men, >50 women);obesity (BMI >30kg/m2);hypertension;dyslipidemia Sleep patterns: has frequent nighttime awakenings, gets up 1-2 times nightly to void and sleeps 5-6 hours nightly.    Home Safety/Smoke Alarms: Feels safe in home. Smoke alarms in place.  Living environment; residence and Firearm Safety: 1-story house/ trailer, equipment: Radio producer, Type: Narrow [employer], Type: Tub Surveyor, quantity, no firearms. Lives with husband, no needs for DME, good support system Seat Belt Safety/Bike Helmet: Wears seat belt.    Objective:     Vitals: BP 132/68   Pulse (!) 58   Resp 18   Ht 5\' 1"  (1.549 m)   Wt 196 lb (88.9 kg)   SpO2 97%   BMI 37.03 kg/m   Body mass index is 37.03 kg/m.   Tobacco Social History   Tobacco Use  Smoking Status Never Smoker  Smokeless Tobacco Never Used     Counseling given: Not Answered   Past Medical History:  Diagnosis Date  . Allergy   . Asthmatic bronchitis   . Cancer (HCC)    skin cancer- squamous cell- scalp, right wrist  . Constipation    chronic  . COPD (chronic obstructive pulmonary disease) ([place])   . Diverticulosis of colon   . DJD (degenerative joint disease)   . Glaucoma 1977   EYE SURGERY  FOR TX OF GLAUCOMA--NO LONGER HAS GLAUCOMA  . Hepatic cyst    s/p surgical intervention 05/2011 -[place]  . Hiatal hernia   . Hx of colonic polyps   . Hypertension   . Kidney stone   . Lipoma   . Medullary sponge kidney   . Nontoxic multinodular goiter    pt unsure of this  . Obesity   . Osteoporosis   . Renal calculus    s/p lithotripsy 06/2011  . Restless leg syndrome   . Shingles 2009  . Venous insufficiency   . Vitamin D deficiency     Past Surgical History:  Procedure Laterality Date  . bilateral carpel tunnel repairs     Dr. Shellia Carwin  left--1999   right--2008  . bilateral wrist fracture, right  1996   left 2001, Dr. Daylene Katayama  . CATARACT EXTRACTION  04/07/13 R, 05/18/13 L   [facility]  . COLONOSCOPY    . eye surgery Left 11/22/2014   "MD pucker" repair  . [place]  . [place]  . HERNIA REPAIR  2012   umb hernia  . LAPAROSCOPIC LIVER CYST FENESTRATION  05/2011  . LIPOMA EXCISION  08/13/2012   Procedure: EXCISION LIPOMA;  Surgeon: Magnus Sinning, MD;  Location: [facility];  Service: Orthopedics;  Laterality: Left;  . SHOULDER OPEN ROTATOR CUFF REPAIR  08/13/2012   Procedure: ROTATOR CUFF REPAIR SHOULDER OPEN;  Surgeon: Magnus Sinning, MD;  Location: [facility];  Service: Orthopedics;  Laterality: Left;  LEFT SHOULDER ANTERIOR ACROMINECTOMY AND ROTATOR CUFF REPAIR AND EXCISION LEFT SHOULDER LIPOMA  . SQUAMOUS CELL CARCINOMA EXCISION  2010   L [place]  . TONSILLECTOMY  1967   Family History  Problem Relation Age of Onset  . Lung cancer Father   . Colon cancer Father        possible colon cancer, but unsure  . Lung  cancer Sister   . Irritable bowel syndrome Sister   . Obstructive Sleep Apnea Sister   . Osteoarthritis Sister   . Esophageal cancer Neg Hx   . Rectal cancer Neg Hx   . Stomach cancer Neg Hx    Social History   Substance and Sexual Activity  Sexual Activity Not on file    Outpatient Encounter Medications as of 07/01/2017  Medication Sig  . albuterol (PROVENTIL) (2.5 MG/3ML) 0.083% nebulizer solution USE 1 VIAL VIA NEBULIZER FOUR TIMES DAILY  . aspirin 81 MG tablet Take 2 tablets by mouth at bedtime  . bisacodyl (DULCOLAX) 5 MG EC tablet Take 5 mg by mouth daily as needed. For constipation  . Calcium Carbonate-Vitamin D 600-400 MG-UNIT per tablet Take 1 tablet by mouth 3 (three) times daily.  . Cholecalciferol (VITAMIN D) 1000 UNITS capsule Take 2,000 Units by mouth daily.  VITAMIN D 3  . clonazePAM (KLONOPIN) 0.5 MG tablet 1-3 taken 30 minutes before bed  . Co-Enzyme Q-10 100 MG CAPS Take 200 mg by mouth daily.   Marland Kitchen docusate sodium (COLACE) 100 MG capsule Take 200 mg by mouth at bedtime as needed. For constipation  . esomeprazole (NEXIUM) 20 MG capsule Take 20 mg by mouth as needed.   . Fluticasone-Salmeterol (ADVAIR DISKUS) 500-50 MCG/DOSE AEPB Inhale 1 puff into the lungs every 12 (twelve) hours. (Patient taking differently: Inhale 1 puff into the lungs daily. )  . furosemide (LASIX) 40 MG tablet Take 1 tablet (40 mg total) by mouth 3 (three) times daily. KEEP 07/08/2017 APPOINTMENT FOR FURTHER REFILLS  . gabapentin (NEURONTIN) 300 MG capsule TAKE 1 CAPSULE BY MOUTH 3  TIMES DAILY  . guaiFENesin (MUCINEX) 600 MG 12 hr tablet Take 600 mg by mouth 2 (two) times daily as needed. Reported on 12/22/2015  . ibuprofen (ADVIL,MOTRIN) 200 MG tablet Take 200 mg by mouth every 6 (six) hours as needed.  Astrid Drafts Ultra Strength 1500 MG CAPS Take 1,500 mg by mouth daily.  Marland Kitchen losartan (COZAAR) 100 MG tablet Take 1 tablet (100 mg total) by mouth daily. KEEP 07/08/2017 APPOINTMENT FOR FURTHER REFILLS  . Magnesium 400 MG CAPS Take 400 mg by mouth 2 (two) times daily.   . montelukast (SINGULAIR) 10 MG tablet Take 1 tablet (10 mg total) at bedtime by mouth. Must keep schedule appt for future refills  . Multiple Vitamin (MULTIVITAMIN) capsule Take 1 capsule by mouth daily. Iron free  . potassium chloride SA (K-DUR,KLOR-CON) 20 MEQ tablet Take one tablet one day then two tablets the next day, alternating every other day. KEEP 07/08/2017 APPOINTMENT FOR FURTHER REFILLS  . pravastatin (PRAVACHOL) 40 MG tablet Take 1 tablet (40 mg total) daily by mouth. Keep 07/08/2017 appointment for further refills  . Probiotic Product (PROBIOTIC DAILY PO) Take 1 tablet by mouth 2 (two) times daily.  Marland Kitchen rOPINIRole (REQUIP) 0.5 MG tablet TAKE 5 TABLETS DAILY AS  DIRECTED  . theophylline (THEODUR) 300 MG 12 hr  tablet Take 1 tablet by mouth two  times daily  . theophylline (THEODUR) 300 MG 12 hr tablet TAKE 1 TABLET BY MOUTH TWO  TIMES DAILY  . traMADol (ULTRAM) 50 MG tablet TAKE 1 TABLET BY MOUTH EVERY 8 HOURS AS NEEDED FOR MODERATE TO SEVERE PAIN  . triamcinolone cream (KENALOG) 0.1 % Apply 1 application topically 2 (two) times daily.  . [DISCONTINUED] Fluticasone-Salmeterol (AIRDUO RESPICLICK 440/10) 272-53 MCG/ACT AEPB Inhale 1 puff into the lungs 2 (two) times daily. Rinse mouth (Patient not taking: Reported  on 07/01/2017)  . [DISCONTINUED] Multiple Vitamins-Minerals (ICAPS AREDS 2) CAPS Take by mouth 2 (two) times daily.  . [DISCONTINUED] theophylline (THEODUR) 300 MG 12 hr tablet TAKE 1 TABLET BY MOUTH TWO  TIMES DAILY (Patient not taking: Reported on 07/01/2017)   No facility-administered encounter medications on file as of 07/01/2017.     Activities of Daily Living In your present state of health, do you have any difficulty performing the following activities: 07/01/2017  Hearing? N  Vision? N  Difficulty concentrating or making decisions? N  Walking or climbing stairs? Y  Dressing or bathing? N  Doing errands, shopping? N  Preparing Food and eating ? N  Using the Toilet? N  In the past six months, have you accidently leaked urine? Y  Comment has urge incontinence  Do you have problems with loss of bowel control? N  Managing your Medications? N  Managing your Finances? N  Housekeeping or managing your Housekeeping? N  Some recent data might be hidden    Patient Care Team: Hoyt Koch, MD as PCP - General (Internal Medicine) Irine Seal, MD (Urology) Rolm Bookbinder, MD (Dermatology) Rutherford Guys, MD (Ophthalmology) [place], Romilda Garret, [place] (Podiatry) Deneise Lever, MD (Pulmonary Disease) Milus Banister, MD (Gastroenterology) Jerelyn Charles, MD as Consulting Physician (Obstetrics) Noralee Space, MD as Consulting Physician (Pulmonary Disease) [place], Laurice Record, MD  (Inactive) as Consulting Physician (Orthopedic Surgery) Edrick Kins, DPM as Consulting Physician (Podiatry) Jerelyn Charles, MD as Consulting Physician (Obstetrics)    Assessment:    Physical assessment deferred to PCP.  Exercise Activities and Dietary recommendations Current Exercise Habits: Structured exercise class, Time (Minutes): 55, Frequency (Times/Week): 3, Weekly Exercise (Minutes/Week): 165, Intensity: Mild, Exercise limited by: orthopedic condition(s)  Diet (meal preparation, eat out, water intake, caffeinated beverages, dairy products, fruits and vegetables): in general, a "healthy" diet  , well balanced, doing the next 56 days diet and has lost 36 pounds, eats a variety of fruits and vegetables daily, limits salt, fat/cholesterol, sugar, caffeine, drinks 6-8 glasses of water daily.  Goals    . Patient Stated     I want to continue to lose weight and stay on the next 56 days diet until I reach my weight goal, I will continue to do water aerobics.       Fall Risk Fall Risk  07/01/2017 06/25/2016 08/30/2015 06/07/2014  Falls in the past year? Yes No Yes No  Number falls in past yr: 1 - 1 -  Injury with Fall? Yes - No -  Risk for fall due to : Impaired balance/gait;Impaired mobility - - -  Follow up Falls prevention discussed - - -   Depression Screen PHQ 2/9 Scores 07/01/2017 06/25/2016 08/30/2015 06/07/2014  PHQ - 2 Score 0 0 0 0  PHQ- 9 Score 3 - - -     Cognitive Function MMSE - Mini Mental State Exam 07/01/2017 06/25/2016  Orientation to time 5 5  Orientation to Place 5 5  Registration 3 3  Attention/ Calculation 5 5  Recall 2 3  Language- name 2 objects 2 2  Language- repeat 1 1  Language- follow 3 step command 3 3  Language- read & follow direction 1 1  Write a sentence 1 1  Copy design 1 1  Total score 29 30        Immunization History  Administered Date(s) Administered  . H1N1 07/27/2008  . Influenza Split 05/16/2011  . Influenza, High Dose  Seasonal PF 06/13/2015, 05/11/2016  .  Influenza,inj,Quad PF,6+ Mos 06/02/2013, 06/07/2014  . Influenza-Unspecified 05/05/2017  . Pneumococcal Conjugate-13 01/04/2014  . Pneumococcal Polysaccharide-23 05/15/2012, 07/01/2017  . Td 09/26/2009  . Tdap 06/07/2014  . Zoster 01/09/2013   Screening Tests Health Maintenance  Topic Date Due  . COLONOSCOPY  11/28/2017  . MAMMOGRAM  06/26/2018  . TETANUS/TDAP  06/07/2024  . INFLUENZA VACCINE  Completed  . DEXA SCAN  Completed  . Hepatitis C Screening  Completed  . PNA vac Low Risk Adult  Completed      Plan:    Continue doing brain stimulating activities (puzzles, reading, adult coloring books, staying active) to keep memory sharp.   Continue to eat heart healthy diet (full of fruits, vegetables, whole grains, lean protein, water--limit salt, fat, and sugar intake) and increase physical activity as tolerated.  I have personally reviewed and noted the following in the patient's chart:   . Medical and social history . Use of alcohol, tobacco or illicit drugs  . Current medications and supplements . Functional ability and status . Nutritional status . Physical activity . Advanced directives . List of other physicians . Vitals . Screenings to include cognitive, depression, and falls . Referrals and appointments  In addition, I have reviewed and discussed with patient certain preventive protocols, quality metrics, and best practice recommendations. A written personalized care plan for preventive services as well as general preventive health recommendations were provided to patient.     Michiel Cowboy, RN  07/01/2017

## 2017-07-01 ENCOUNTER — Ambulatory Visit (INDEPENDENT_AMBULATORY_CARE_PROVIDER_SITE_OTHER): Payer: Medicare Other | Admitting: *Deleted

## 2017-07-01 VITALS — BP 132/68 | HR 58 | Resp 18 | Ht 61.0 in | Wt 196.0 lb

## 2017-07-01 DIAGNOSIS — Z0000 Encounter for general adult medical examination without abnormal findings: Secondary | ICD-10-CM | POA: Diagnosis not present

## 2017-07-01 DIAGNOSIS — Z23 Encounter for immunization: Secondary | ICD-10-CM | POA: Diagnosis not present

## 2017-07-01 NOTE — Progress Notes (Signed)
Medical screening examination/treatment/procedure(s) were performed by non-physician practitioner and as supervising physician I was immediately available for consultation/collaboration. I agree with above. Elizabeth A Crawford, MD 

## 2017-07-01 NOTE — Patient Instructions (Addendum)
Continue doing brain stimulating activities (puzzles, reading, adult coloring books, staying active) to keep memory sharp.   Continue to eat heart healthy diet (full of fruits, vegetables, whole grains, lean protein, water--limit salt, fat, and sugar intake) and increase physical activity as tolerated.   Lisa Hamilton , Thank you for taking time to come for your Medicare Wellness Visit. I appreciate your ongoing commitment to your health goals. Please review the following plan we discussed and let me know if I can assist you in the future.   These are the goals we discussed: Goals    . Patient Stated     I want to continue to lose weight and stay on the next 56 days diet until I reach my weight goal, I will continue to do water aerobics.        This is a list of the screening recommended for you and due dates:  Health Maintenance  Topic Date Due  . Colon Cancer Screening  11/28/2017  . Mammogram  06/26/2018  . Tetanus Vaccine  06/07/2024  . Flu Shot  Completed  . DEXA scan (bone density measurement)  Completed  .  Hepatitis C: One time screening is recommended by Center for Disease Control  (CDC) for  adults born from [birth year] through 1964.   Completed  . Pneumonia vaccines  Completed

## 2017-07-02 NOTE — Progress Notes (Signed)
   HPI: 71 year old female presents to the office today for follow up evaluation of bilateral foot pain. She states the pain is primarily located on the lateral sides of the feet and prevents her from wearing tennis shoes. She was referred from Dr. Milinda Pointer for surgical intervention. Patient is here for further evaluation and treatment.    Past Medical History:  Diagnosis Date  . Allergy   . Asthmatic bronchitis   . Cancer (HCC)    skin cancer- squamous cell- scalp, right wrist  . Constipation    chronic  . COPD (chronic obstructive pulmonary disease) ([place])   . Diverticulosis of colon   . DJD (degenerative joint disease)   . Glaucoma 1977   EYE SURGERY  FOR TX OF GLAUCOMA--NO LONGER HAS GLAUCOMA  . Hepatic cyst    s/p surgical intervention 05/2011 -[facility]  . Hiatal hernia   . Hx of colonic polyps   . Hypertension   . Kidney stone   . Lipoma   . Medullary sponge kidney   . Nontoxic multinodular goiter    pt unsure of this  . Obesity   . Osteoporosis   . Renal calculus    s/p lithotripsy 06/2011  . Restless leg syndrome   . Shingles 2009  . Venous insufficiency   . Vitamin D deficiency      Physical Exam: General: The patient is alert and oriented x3 in no acute distress.  Dermatology: Skin is warm, dry and supple bilateral lower extremities. Negative for open lesions or macerations.  Vascular: Palpable pedal pulses bilaterally. No edema or erythema noted. Capillary refill within normal limits.  Neurological: Epicritic and protective threshold grossly intact bilaterally.   Musculoskeletal Exam: Pain with palpation to the peroneal tendons of bilateral feet. Pain with palpation also noted to the bilateral fifth metatarsal bases. Range of motion within normal limits to all pedal and ankle joints bilateral. Muscle strength 5/5 in all groups bilateral.   Radiographic Exam:  Prominent 5th metatarsal base tubercles with global metatarsal adductus and flexible equinovarus  deformity.    Assessment: - Prominent 5th metatarsal tubercles bilaterally - Insertional peroneal tendinitis bilaterally    Plan of Care:  - Patient evaluated. X-Rays reviewed.  - Today we discussed the conservative versus surgical management of the presenting pathology. The patient opts for surgical management. All possible complications and details of the procedure were explained. All patient questions were answered. No guarantees were expressed or implied. - Authorization for surgery was initiated today. Surgery will consist of exostectomy left 5th metatarsal and left peroneal tendon repair.  - Tall CAM boot dispensed.  - Return to clinic 1 week post op.    Edrick Kins, DPM [facility]  Dr. Edrick Kins, DPM    [address] [place], [place] [address]                Office [phone]  Fax [phone]

## 2017-07-04 ENCOUNTER — Telehealth: Payer: Self-pay | Admitting: *Deleted

## 2017-07-04 MED ORDER — ZOSTER VAC RECOMB ADJUVANTED 50 MCG/0.5ML IM SUSR: 0.5000 mL | Freq: Once | INTRAMUSCULAR | 1 refills | Status: AC

## 2017-07-04 NOTE — Telephone Encounter (Signed)
Patient called and requested to have the Shingrix vaccine prescription sent to [facility] in [place]. Spoke to PCP and received a verbal order to send prescription.

## 2017-07-08 ENCOUNTER — Ambulatory Visit: Payer: Medicare Other | Admitting: Internal Medicine

## 2017-07-08 ENCOUNTER — Encounter: Payer: Self-pay | Admitting: Internal Medicine

## 2017-07-08 ENCOUNTER — Ambulatory Visit (INDEPENDENT_AMBULATORY_CARE_PROVIDER_SITE_OTHER): Payer: Medicare Other | Admitting: Internal Medicine

## 2017-07-08 ENCOUNTER — Other Ambulatory Visit (INDEPENDENT_AMBULATORY_CARE_PROVIDER_SITE_OTHER): Payer: Medicare Other

## 2017-07-08 VITALS — BP 122/70 | HR 90 | Temp 98.2°F | Ht 61.0 in | Wt 190.0 lb

## 2017-07-08 DIAGNOSIS — R7301 Impaired fasting glucose: Secondary | ICD-10-CM | POA: Diagnosis not present

## 2017-07-08 DIAGNOSIS — E785 Hyperlipidemia, unspecified: Secondary | ICD-10-CM | POA: Diagnosis not present

## 2017-07-08 DIAGNOSIS — E7800 Pure hypercholesterolemia, unspecified: Secondary | ICD-10-CM | POA: Diagnosis not present

## 2017-07-08 DIAGNOSIS — I10 Essential (primary) hypertension: Secondary | ICD-10-CM | POA: Diagnosis not present

## 2017-07-08 DIAGNOSIS — R739 Hyperglycemia, unspecified: Secondary | ICD-10-CM

## 2017-07-08 DIAGNOSIS — J449 Chronic obstructive pulmonary disease, unspecified: Secondary | ICD-10-CM

## 2017-07-08 DIAGNOSIS — M150 Primary generalized (osteo)arthritis: Secondary | ICD-10-CM

## 2017-07-08 DIAGNOSIS — M2550 Pain in unspecified joint: Secondary | ICD-10-CM | POA: Diagnosis not present

## 2017-07-08 DIAGNOSIS — M159 Polyosteoarthritis, unspecified: Secondary | ICD-10-CM

## 2017-07-08 DIAGNOSIS — M8949 Other hypertrophic osteoarthropathy, multiple sites: Secondary | ICD-10-CM

## 2017-07-08 LAB — CBC
HCT: 45 % (ref 36.0–46.0)
HEMOGLOBIN: 14.7 g/dL (ref 12.0–15.0)
MCHC: 32.7 g/dL (ref 30.0–36.0)
MCV: 92.3 fl (ref 78.0–100.0)
PLATELETS: 295 10*3/uL (ref 150.0–400.0)
RBC: 4.87 Mil/uL (ref 3.87–5.11)
RDW: 14.7 % (ref 11.5–15.5)
WBC: 7.4 10*3/uL (ref 4.0–10.5)

## 2017-07-08 LAB — LIPID PANEL
CHOLESTEROL: 193 mg/dL (ref 0–200)
HDL: 46.4 mg/dL (ref >39.00)
LDL Cholesterol: 119 mg/dL — ABNORMAL HIGH (ref 0–99)
NonHDL: 146.48
TRIGLYCERIDES: 138 mg/dL (ref 0.0–149.0)
Total CHOL/HDL Ratio: 4
VLDL: 27.6 mg/dL (ref 0.0–40.0)

## 2017-07-08 LAB — COMPREHENSIVE METABOLIC PANEL
ALK PHOS: 135 U/L — AB (ref 39–117)
ALT: 16 U/L (ref 0–35)
AST: 21 U/L (ref 0–37)
Albumin: 4.6 g/dL (ref 3.5–5.2)
BILIRUBIN TOTAL: 0.7 mg/dL (ref 0.2–1.2)
BUN: 22 mg/dL (ref 6–23)
CALCIUM: 9.7 mg/dL (ref 8.4–10.5)
CO2: 36 meq/L — AB (ref 19–32)
Chloride: 96 mEq/L (ref 96–112)
Creatinine, Ser: 0.68 mg/dL (ref 0.40–1.20)
GFR: 90.91 mL/min (ref >60.00)
Glucose, Bld: 103 mg/dL — ABNORMAL HIGH (ref 70–99)
Potassium: 3 mEq/L — ABNORMAL LOW (ref 3.5–5.1)
Sodium: 140 mEq/L (ref 135–145)
Total Protein: 7.9 g/dL (ref 6.0–8.3)

## 2017-07-08 LAB — HEMOGLOBIN A1C: Hgb A1c MFr Bld: 5.9 % (ref 4.6–6.5)

## 2017-07-08 MED ORDER — PRAVASTATIN SODIUM 40 MG PO TABS
40.0000 mg | ORAL_TABLET | Freq: Every day | ORAL | 3 refills | Status: DC
Start: 2017-07-08 — End: 2018-10-21

## 2017-07-08 MED ORDER — POTASSIUM CHLORIDE CRYS ER 20 MEQ PO TBCR: EXTENDED_RELEASE_TABLET | ORAL | 3 refills | Status: DC

## 2017-07-08 MED ORDER — LOSARTAN POTASSIUM 100 MG PO TABS: 100.0000 mg | ORAL_TABLET | Freq: Every day | ORAL | 3 refills | Status: DC

## 2017-07-08 MED ORDER — FUROSEMIDE 40 MG PO TABS: 40.0000 mg | ORAL_TABLET | Freq: Three times a day (TID) | ORAL | 3 refills | Status: DC

## 2017-07-08 MED ORDER — TRAMADOL HCL 50 MG PO TABS: 50.0000 mg | ORAL_TABLET | Freq: Every day | ORAL | 0 refills | Status: AC | PRN

## 2017-07-08 MED ORDER — MONTELUKAST SODIUM 10 MG PO TABS: 10.0000 mg | ORAL_TABLET | Freq: Every day | ORAL | 3 refills | Status: DC

## 2017-07-08 NOTE — Progress Notes (Signed)
   Subjective:    Patient ID: Lisa Hamilton, female    DOB: January 09, 1946, 71 y.o.   MRN: [id number]  HPI The patient is a  71 YO female coming in for follow up of medical problems including her blood pressure (taking lasix, losartan, denies side effects, denies headaches or chest pains or SOB), and her asthma (she is still taking theophylline, singulair, albuterol prn, no increased SOB today, has not seen pulmonary since Jan 2018, not smoking, tries to exercise but not doing much lately), and her hyperglycemia (possible related to steroid courses over the years, not exercising, BMI 35) and her arthritis (uses 1 tramadol nightly to help her fall asleep, uses otc medications during the day).   Review of Systems  Constitutional: Negative.   HENT: Negative.   Eyes: Negative.   Respiratory: Negative for cough, chest tightness and shortness of breath.   Cardiovascular: Negative for chest pain, palpitations and leg swelling.  Gastrointestinal: Negative for abdominal distention, abdominal pain, constipation, diarrhea, nausea and vomiting.  Musculoskeletal: Positive for arthralgias.  Skin: Negative.   Neurological: Negative.   Psychiatric/Behavioral: Negative.       Objective:   Physical Exam  Constitutional: She is oriented to person, place, and time. She appears well-developed and well-nourished.  HENT:  Head: Normocephalic and atraumatic.  Eyes: EOM are normal.  Neck: Normal range of motion.  Cardiovascular: Normal rate and regular rhythm.  Pulmonary/Chest: Effort normal and breath sounds normal. No respiratory distress. She has no wheezes. She has no rales.  Abdominal: Soft. Bowel sounds are normal. She exhibits no distension. There is no tenderness. There is no rebound.  Musculoskeletal: She exhibits no edema.  Neurological: She is alert and oriented to person, place, and time. Coordination normal.  Skin: Skin is warm and dry.  Psychiatric: She has a normal mood and affect.   Vitals:   07/08/17 1419  BP: 122/70  Pulse: 90  Temp: 98.2 F (36.8 C)  TempSrc: Oral  SpO2: 93%  Weight: 190 lb (86.2 kg)  Height: 5\' 1"  (1.549 m)      Assessment & Plan:

## 2017-07-08 NOTE — Patient Instructions (Signed)
We have given you the refills. Call Dr. Janee Morn office for the theophylline refill.   We are checking the labs today.

## 2017-07-09 LAB — RHEUMATOID FACTOR

## 2017-07-11 ENCOUNTER — Other Ambulatory Visit: Payer: Self-pay

## 2017-07-11 ENCOUNTER — Ambulatory Visit: Payer: Self-pay | Admitting: *Deleted

## 2017-07-11 ENCOUNTER — Telehealth: Payer: Self-pay | Admitting: Internal Medicine

## 2017-07-11 MED ORDER — POTASSIUM CHLORIDE CRYS ER 20 MEQ PO TBCR: 40.0000 meq | EXTENDED_RELEASE_TABLET | Freq: Every day | ORAL | 3 refills | Status: DC

## 2017-07-11 NOTE — Assessment & Plan Note (Signed)
Checking lipid panel and adjust her pravastatin as needed.

## 2017-07-11 NOTE — Telephone Encounter (Signed)
Copied from [place] [id number]. Topic: Quick Communication - See Telephone Encounter >> Jul 11, 2017  1:28 PM Corie Chiquito, [place] wrote: CRM for notification. See Telephone encounter for: Patient called because because her Potassium Chloride Sa dose needs to be increased. If someone could please give her a call back about this matter at [phone]  07/11/17.

## 2017-07-11 NOTE — Telephone Encounter (Signed)
Mailed

## 2017-07-11 NOTE — Telephone Encounter (Signed)
Opened in error

## 2017-07-11 NOTE — Assessment & Plan Note (Signed)
BMI 95.3 but complicated by OA, hyperlipidemia, hypertension, impaired fasting sugars.

## 2017-07-11 NOTE — Telephone Encounter (Signed)
Okay to print and mail to her, but we want her to start more potassium now.

## 2017-07-11 NOTE — Assessment & Plan Note (Signed)
Refill tramadol to use at night time prn.

## 2017-07-11 NOTE — Telephone Encounter (Signed)
Called in and was given results and instruction for increasing her potassium.  Please let her know that the potassium is still slightly low and we would like to go to 2 pills of potassium.  No signs of diabetes and no signs of rheumatoid arthritis.  She is requesting an Rx be printed and sent to her directly not to the pharmacy for the 180 pills needed for 90 days at 2/day.  She prefers this way because she is trying avoid going into the donut hole with Medicare.    I routed this note to Dr. Nathanial Millman nurse pool.

## 2017-07-11 NOTE — Assessment & Plan Note (Signed)
Needs to get theophylline from pulmonary as they should be monitoring, sees them back in January. No flare today, okay with refilling singulair and albuterol.

## 2017-07-11 NOTE — Assessment & Plan Note (Signed)
Checking HgA1c and adjust as needed.  

## 2017-07-11 NOTE — Assessment & Plan Note (Signed)
Taking lasix and losartan, checking CMP today and adjust as needed.

## 2017-07-15 NOTE — Telephone Encounter (Signed)
Pt. Had contact with provider in regard to potassium.

## 2017-07-16 ENCOUNTER — Telehealth: Payer: Self-pay | Admitting: Podiatry

## 2017-07-16 NOTE — Telephone Encounter (Signed)
Note to Gretta Arab, RN Tiffany Speight, CMA and Wardell Heath, CMA to fit pt for shorter air fracture walker.

## 2017-07-16 NOTE — Telephone Encounter (Signed)
I'm scheduled to have surgery on 27 December with Dr. Amalia Hailey. The boot I was given is too tall as it comes to my knee. I need something shorter than a 17.5 inch. I will be coming up there on 21 December and if you have a shorter boot I would like to exchange it at that time if I may. Please call me back at [phone]. Thank you.

## 2017-07-18 ENCOUNTER — Telehealth: Payer: Self-pay | Admitting: Podiatry

## 2017-07-18 NOTE — Telephone Encounter (Signed)
I informed pt that she could come to the office and I would fit with the shorter boot, and insurance would be billed. Pt states when she places her foot by the boot, it would be impossible to bend her knee. I told pt that she would need to be in the boot to see if the knee would have proper movement. Pt states she has a old short boot, and she could wear it with the taller boot on the other foot, I told her that would cause her to fall, but she could take to the surgery center, but it was too old or not in good shape they would put her in one of their boots and bill her insurance. Pt states she will be by 07/25/2017.

## 2017-07-18 NOTE — Telephone Encounter (Signed)
I received a boot that is too tall for me to wear after I have surgery that I'm scheduled for on 27 December with Dr. Amalia Hailey. I need a boot about 13 inches because the current one is 17 inches and is too tall. Please call and let me know if I can exchange this boot as I will be in [place] on 21 December. You can reach me at [phone]. Thank you.

## 2017-07-25 DIAGNOSIS — M810 Age-related osteoporosis without current pathological fracture: Secondary | ICD-10-CM | POA: Diagnosis not present

## 2017-07-25 DIAGNOSIS — M85851 Other specified disorders of bone density and structure, right thigh: Secondary | ICD-10-CM | POA: Diagnosis not present

## 2017-07-25 DIAGNOSIS — Z1231 Encounter for screening mammogram for malignant neoplasm of breast: Secondary | ICD-10-CM | POA: Diagnosis not present

## 2017-07-25 LAB — HM MAMMOGRAPHY

## 2017-07-25 LAB — HM DEXA SCAN: HM Dexa Scan: -2.8

## 2017-07-29 ENCOUNTER — Other Ambulatory Visit: Payer: Self-pay | Admitting: Internal Medicine

## 2017-07-30 ENCOUNTER — Encounter: Payer: Self-pay | Admitting: Internal Medicine

## 2017-07-30 NOTE — Progress Notes (Signed)
Abstracted and sent to scan  

## 2017-07-31 ENCOUNTER — Encounter: Payer: Self-pay | Admitting: Podiatry

## 2017-07-31 DIAGNOSIS — M7752 Other enthesopathy of left foot: Secondary | ICD-10-CM | POA: Diagnosis not present

## 2017-07-31 DIAGNOSIS — E7800 Pure hypercholesterolemia, unspecified: Secondary | ICD-10-CM | POA: Diagnosis not present

## 2017-07-31 DIAGNOSIS — D1632 Benign neoplasm of short bones of left lower limb: Secondary | ICD-10-CM | POA: Diagnosis not present

## 2017-07-31 DIAGNOSIS — S86312A Strain of muscle(s) and tendon(s) of peroneal muscle group at lower leg level, left leg, initial encounter: Secondary | ICD-10-CM | POA: Diagnosis not present

## 2017-07-31 DIAGNOSIS — M898X7 Other specified disorders of bone, ankle and foot: Secondary | ICD-10-CM | POA: Diagnosis not present

## 2017-07-31 DIAGNOSIS — M25775 Osteophyte, left foot: Secondary | ICD-10-CM | POA: Diagnosis not present

## 2017-07-31 DIAGNOSIS — M25572 Pain in left ankle and joints of left foot: Secondary | ICD-10-CM | POA: Diagnosis not present

## 2017-08-06 ENCOUNTER — Ambulatory Visit (INDEPENDENT_AMBULATORY_CARE_PROVIDER_SITE_OTHER): Payer: Medicare Other | Admitting: Podiatry

## 2017-08-06 ENCOUNTER — Ambulatory Visit (INDEPENDENT_AMBULATORY_CARE_PROVIDER_SITE_OTHER): Payer: Medicare Other

## 2017-08-06 DIAGNOSIS — Z9889 Other specified postprocedural states: Secondary | ICD-10-CM

## 2017-08-06 DIAGNOSIS — M898X7 Other specified disorders of bone, ankle and foot: Secondary | ICD-10-CM

## 2017-08-06 DIAGNOSIS — M7672 Peroneal tendinitis, left leg: Secondary | ICD-10-CM

## 2017-08-06 MED ORDER — OXYCODONE-ACETAMINOPHEN 5-325 MG PO TABS: 1.0000 | ORAL_TABLET | Freq: Three times a day (TID) | ORAL | 0 refills | Status: DC | PRN

## 2017-08-07 ENCOUNTER — Other Ambulatory Visit: Payer: Self-pay | Admitting: Podiatry

## 2017-08-07 DIAGNOSIS — M7672 Peroneal tendinitis, left leg: Secondary | ICD-10-CM

## 2017-08-08 ENCOUNTER — Encounter: Payer: Self-pay | Admitting: Internal Medicine

## 2017-08-08 NOTE — Progress Notes (Signed)
Abstracted and sent to scan  

## 2017-08-09 NOTE — Progress Notes (Signed)
   Subjective:  Patient presents today status post left foot surgery. DOS: 07/31/17.  She states she is doing well overall.  She has no new complaints at this time.  She is here for further evaluation and treatment.   Past Medical History:  Diagnosis Date  . Allergy   . Asthmatic bronchitis   . Cancer (HCC)    skin cancer- squamous cell- scalp, right wrist  . Constipation    chronic  . COPD (chronic obstructive pulmonary disease) ([place])   . Diverticulosis of colon   . DJD (degenerative joint disease)   . Glaucoma 1977   EYE SURGERY  FOR TX OF GLAUCOMA--NO LONGER HAS GLAUCOMA  . Hepatic cyst    s/p surgical intervention 05/2011 -[facility]  . Hiatal hernia   . Hx of colonic polyps   . Hypertension   . Kidney stone   . Lipoma   . Medullary sponge kidney   . Nontoxic multinodular goiter    pt unsure of this  . Obesity   . Osteoporosis   . Renal calculus    s/p lithotripsy 06/2011  . Restless leg syndrome   . Shingles 2009  . Venous insufficiency   . Vitamin D deficiency       Objective/Physical Exam Skin incisions appear to be well coapted with sutures and staples intact. No sign of infectious process noted. No dehiscence. No active bleeding noted. Moderate edema noted to the surgical extremity.  Radiographic Exam:  Osteotomies sites appear to be stable with routine healing.  Assessment: 1. s/p left foot surgery. DOS: 07/31/17   Plan of Care:  1.  Patient was evaluated. X-rays reviewed 2.  Dressing change. 3.  Continue weightbearing in cam boot. 4.  Refill prescription for Percocet 5/325 mg provided to patient. 5.  Return to clinic in 1 week for suture removal.   Edrick Kins, DPM [facility]  Dr. Edrick Kins, [address]                                        [place], [place] [address]                Office [phone]  Fax [phone]

## 2017-08-13 ENCOUNTER — Encounter: Payer: Self-pay | Admitting: Podiatry

## 2017-08-13 ENCOUNTER — Ambulatory Visit (INDEPENDENT_AMBULATORY_CARE_PROVIDER_SITE_OTHER): Payer: Medicare Other | Admitting: Podiatry

## 2017-08-13 DIAGNOSIS — Z9889 Other specified postprocedural states: Secondary | ICD-10-CM

## 2017-08-13 DIAGNOSIS — M7672 Peroneal tendinitis, left leg: Secondary | ICD-10-CM

## 2017-08-13 DIAGNOSIS — M898X7 Other specified disorders of bone, ankle and foot: Secondary | ICD-10-CM

## 2017-08-14 ENCOUNTER — Telehealth: Payer: Self-pay | Admitting: Podiatry

## 2017-08-14 NOTE — Telephone Encounter (Signed)
Pt states she received a call from PT and she was not aware she was to begin PT, Dr. Amalia Hailey did not inform. I reviewed Dr. Amalia Hailey' written orders for [facility] with pt. Pt states she had set up an appt, but was not certain her insurance would cover. Pt states she felt insurance would cover if stated as medically necessary. I told her DR. EVans felt it was and it would have [facility] check her insurance for her. Pt asked if she would be in the boot or needed a tennis shoe and I told pt to bring it in case, she would have the boot on and off through her therapy.  Pt states she was only able to set up one appt next week all others were booked up and [facility] is only there 2 days a week, and she may want to set up at the [place] location. I told pt, to set up what would be convenient for her. Pt states understanding.

## 2017-08-14 NOTE — Telephone Encounter (Signed)
I'm one of Dr. Amalia Hailey surgical patients. I would like to talk with one of the nurses about my appointments that are scheduled with [facility]. Please call me back at [phone]. Thank you very much.

## 2017-08-17 NOTE — Progress Notes (Signed)
   Subjective:  Patient presents today status post left foot surgery. DOS: 07/31/17.  She states she is doing well overall.  She reports some intermittent sharp pain in the foot but believes it was because the dressing was too tight. She is here for further evaluation and treatment.   Past Medical History:  Diagnosis Date  . Allergy   . Asthmatic bronchitis   . Cancer (HCC)    skin cancer- squamous cell- scalp, right wrist  . Constipation    chronic  . COPD (chronic obstructive pulmonary disease) ([place])   . Diverticulosis of colon   . DJD (degenerative joint disease)   . Glaucoma 1977   EYE SURGERY  FOR TX OF GLAUCOMA--NO LONGER HAS GLAUCOMA  . Hepatic cyst    s/p surgical intervention 05/2011 -[facility]  . Hiatal hernia   . Hx of colonic polyps   . Hypertension   . Kidney stone   . Lipoma   . Medullary sponge kidney   . Nontoxic multinodular goiter    pt unsure of this  . Obesity   . Osteoporosis   . Renal calculus    s/p lithotripsy 06/2011  . Restless leg syndrome   . Shingles 2009  . Venous insufficiency   . Vitamin D deficiency       Objective/Physical Exam Skin incisions appear to be well coapted with sutures and staples intact. No sign of infectious process noted. No dehiscence. No active bleeding noted. No edema noted to the surgical extremity.  Assessment: 1. s/p left foot surgery. DOS: 07/31/17   Plan of Care:  1.  Patient was evaluated.  2. Staples removed.  3. Continue weightbearing in cam boot for 2 more weeks. 4. Patient can return to water aerobics in two weeks. 5. Orders for physical therapy 3 times weekly for 4 weeks placed today.  6. Return to clinic in 2 weeks.    Edrick Kins, DPM [facility]  Dr. Edrick Kins, [place]                                        [place], [place] [address]                Office [phone]  Fax [phone]

## 2017-08-18 ENCOUNTER — Encounter: Payer: Self-pay | Admitting: Internal Medicine

## 2017-08-18 ENCOUNTER — Ambulatory Visit (INDEPENDENT_AMBULATORY_CARE_PROVIDER_SITE_OTHER): Payer: Medicare Other | Admitting: Internal Medicine

## 2017-08-18 ENCOUNTER — Telehealth: Payer: Self-pay | Admitting: Internal Medicine

## 2017-08-18 DIAGNOSIS — J4530 Mild persistent asthma, uncomplicated: Secondary | ICD-10-CM | POA: Diagnosis not present

## 2017-08-18 DIAGNOSIS — G2581 Restless legs syndrome: Secondary | ICD-10-CM

## 2017-08-18 MED ORDER — ROPINIROLE HCL 0.5 MG PO TABS: ORAL_TABLET | ORAL | 3 refills | Status: DC

## 2017-08-18 MED ORDER — CLONAZEPAM 0.5 MG PO TABS: ORAL_TABLET | ORAL | 3 refills | Status: DC

## 2017-08-18 MED ORDER — GABAPENTIN 300 MG PO CAPS: 300.0000 mg | ORAL_CAPSULE | Freq: Three times a day (TID) | ORAL | 0 refills | Status: DC

## 2017-08-18 NOTE — Progress Notes (Signed)
HPI female never smoker followed for Restless Legs, back pain that disturbs sleep, asthma, complicated by peripheral venous disease, hemochromatosis  ----------------------------------------------------------------------  08/16/2016- 72 year old female never smoker followed for Restless Legs, back pain that disturbs sleep, asthma, complicated by peripheral venous disease, hemochromatosis FOLLOWS FOR: Pt states the colder weather makes it harder to breathe but uses scarf to cover her nose and mouth when outside. She also continues to use her nebulizer.  Dr Sharlet Salina wants patient to have new neb machince-current one she bought online-needs to establish with local DME for machine and mebs.  Legs frequently wake her now. Using 5 Requip tablets daily, 3 gabapentin's and divided doses clonazepam. Advair controls asthma but is expensive.  08/18/17- 72 year old female never smoker followed for Restless Legs, back pain that disturbs sleep, asthma, complicated by peripheral venous disease, hemochromatosis  ----asthma, ,RLS,follow up, feels asthma under control,RLS feels worse while riding  or sitting,feels legs jumpy,has to move around Theophylline, albuterol nebulizer, Singulair, Requip 0.5 mg (up to 5 tabs daily) Breathing "no problem".  Has a portable nebulizer and wants refill med for it.  Has not been needing Advair and does not know if theophylline does anything for her any longer.  Denies sleep disturbance from asthma. Restless legs if sits to travel.  Uses 5 Requip daily but would need more on days when she is driving back and forth to visit family.  Has been taking 1.5 clonazepam tablets per day but needs to increase bedtime dose so that she is taking total 2 tablets daily.              ROS-see HPI + = positive Constitutional:   No-   weight loss, night sweats, fevers, chills, +fatigue, lassitude. HEENT:   No-  headaches, difficulty swallowing, tooth/dental problems, sore throat,       No-   sneezing, itching, ear ache, nasal congestion, post nasal drip,  CV:  No-   chest pain, orthopnea, PND, swelling in lower extremities, anasarca,                                                       dizziness, palpitations Resp: No-   shortness of breath with exertion or at rest.              No-   productive cough,  No non-productive cough,  No- coughing up of blood.              No-   change in color of mucus. + wheezing.   Skin: No-   rash or lesions. GI:  No-   heartburn, indigestion, abdominal pain, nausea, vomiting,  GU:     MS:  No-   joint pain or swelling.  No- decreased range of motion.  + back pain. Neuro-     + per HPI Psych:  No- change in mood or affect. No depression or anxiety.  No memory loss.  OBJ- Physical Exam General- Alert, Oriented, Affect-appropriate, Distress- none acute, +obese Skin- rash-none, lesions- none, excoriation- none Lymphadenopathy- none Head- atraumatic            Eyes- Gross vision intact, PERRLA, conjunctivae and secretions clear            Ears- Hearing, canals-normal            Nose- Clear, no-Septal dev, mucus,  polyps, erosion, perforation             Throat- Mallampati II-III , mucosa clear , drainage- none, tonsils- atrophic Neck- flexible , trachea midline, no stridor , thyroid nl, carotid no bruit Chest - symmetrical excursion , unlabored           Heart/CV- RRR , no murmur , no gallop  , no rub, nl s1 s2                           - JVD- none , edema +1, stasis changes- none, varices +           Lung- clear to P&A, wheeze- none, cough- none , dullness-none, rub- none           Chest wall-  Abd-  Br/ Gen/ Rectal- Not done, not indicated Extrem- cyanosis- none, clubbing, none, atrophy- none, strength- nl, + walking boot left foot after surgery Neuro- grossly intact to observation, no unusual restlessness or movement

## 2017-08-18 NOTE — Assessment & Plan Note (Signed)
She does not have obvious tremor or Parkinson's disease. Plan-okay to take an extra Requip before long rides.  See if stopping theophylline helps at all.  Okay to continue gabapentin and clonazepam.

## 2017-08-18 NOTE — Telephone Encounter (Signed)
Spoke with pt. She is requesting refills on Clonazepam, Requip and Gabapentin to be sent to [facility]. All 3 of these prescriptions have been taken care of per Clayborne Dana, [place]. Pt is aware of this. Nothing further was needed.

## 2017-08-18 NOTE — Patient Instructions (Signed)
Script sent refilling Requip- We increased up to 6 daily if needed for travel  Script sent for neb solution  Script printed for clonazepam  We suggested you try reducing theophylline to 1/2 tab twice daily x 7 days, then quit. See if your breathing remains good and if maybe the Restless Legs will get better.  Please call if we can help

## 2017-08-18 NOTE — Assessment & Plan Note (Signed)
Uses rescue nebulizer treatments occasionally and continues Singulair.  I would like to get her off of theophylline if possible. Plan-taper off theophylline.

## 2017-08-19 DIAGNOSIS — M25475 Effusion, left foot: Secondary | ICD-10-CM | POA: Diagnosis not present

## 2017-08-19 DIAGNOSIS — M25675 Stiffness of left foot, not elsewhere classified: Secondary | ICD-10-CM | POA: Diagnosis not present

## 2017-08-19 DIAGNOSIS — R269 Unspecified abnormalities of gait and mobility: Secondary | ICD-10-CM | POA: Diagnosis not present

## 2017-08-19 DIAGNOSIS — M25572 Pain in left ankle and joints of left foot: Secondary | ICD-10-CM | POA: Diagnosis not present

## 2017-08-21 DIAGNOSIS — M25572 Pain in left ankle and joints of left foot: Secondary | ICD-10-CM | POA: Diagnosis not present

## 2017-08-21 DIAGNOSIS — M25475 Effusion, left foot: Secondary | ICD-10-CM | POA: Diagnosis not present

## 2017-08-21 DIAGNOSIS — M25675 Stiffness of left foot, not elsewhere classified: Secondary | ICD-10-CM | POA: Diagnosis not present

## 2017-08-21 DIAGNOSIS — R269 Unspecified abnormalities of gait and mobility: Secondary | ICD-10-CM | POA: Diagnosis not present

## 2017-08-22 DIAGNOSIS — M25675 Stiffness of left foot, not elsewhere classified: Secondary | ICD-10-CM | POA: Diagnosis not present

## 2017-08-22 DIAGNOSIS — M25475 Effusion, left foot: Secondary | ICD-10-CM | POA: Diagnosis not present

## 2017-08-22 DIAGNOSIS — R269 Unspecified abnormalities of gait and mobility: Secondary | ICD-10-CM | POA: Diagnosis not present

## 2017-08-22 DIAGNOSIS — M25572 Pain in left ankle and joints of left foot: Secondary | ICD-10-CM | POA: Diagnosis not present

## 2017-08-25 DIAGNOSIS — M25572 Pain in left ankle and joints of left foot: Secondary | ICD-10-CM | POA: Diagnosis not present

## 2017-08-25 DIAGNOSIS — M25675 Stiffness of left foot, not elsewhere classified: Secondary | ICD-10-CM | POA: Diagnosis not present

## 2017-08-25 DIAGNOSIS — M25475 Effusion, left foot: Secondary | ICD-10-CM | POA: Diagnosis not present

## 2017-08-25 DIAGNOSIS — R269 Unspecified abnormalities of gait and mobility: Secondary | ICD-10-CM | POA: Diagnosis not present

## 2017-08-27 ENCOUNTER — Ambulatory Visit (INDEPENDENT_AMBULATORY_CARE_PROVIDER_SITE_OTHER): Payer: Medicare Other | Admitting: Podiatry

## 2017-08-27 ENCOUNTER — Ambulatory Visit (INDEPENDENT_AMBULATORY_CARE_PROVIDER_SITE_OTHER): Payer: Medicare Other

## 2017-08-27 DIAGNOSIS — M25475 Effusion, left foot: Secondary | ICD-10-CM | POA: Diagnosis not present

## 2017-08-27 DIAGNOSIS — M25572 Pain in left ankle and joints of left foot: Secondary | ICD-10-CM | POA: Diagnosis not present

## 2017-08-27 DIAGNOSIS — M7672 Peroneal tendinitis, left leg: Secondary | ICD-10-CM

## 2017-08-27 DIAGNOSIS — R52 Pain, unspecified: Secondary | ICD-10-CM

## 2017-08-27 DIAGNOSIS — M898X7 Other specified disorders of bone, ankle and foot: Secondary | ICD-10-CM

## 2017-08-27 DIAGNOSIS — M25675 Stiffness of left foot, not elsewhere classified: Secondary | ICD-10-CM | POA: Diagnosis not present

## 2017-08-27 DIAGNOSIS — R269 Unspecified abnormalities of gait and mobility: Secondary | ICD-10-CM | POA: Diagnosis not present

## 2017-08-27 MED ORDER — OXYCODONE-ACETAMINOPHEN 5-325 MG PO TABS: 1.0000 | ORAL_TABLET | Freq: Three times a day (TID) | ORAL | 0 refills | Status: DC | PRN

## 2017-08-29 DIAGNOSIS — M25675 Stiffness of left foot, not elsewhere classified: Secondary | ICD-10-CM | POA: Diagnosis not present

## 2017-08-29 DIAGNOSIS — M25475 Effusion, left foot: Secondary | ICD-10-CM | POA: Diagnosis not present

## 2017-08-29 DIAGNOSIS — M25572 Pain in left ankle and joints of left foot: Secondary | ICD-10-CM | POA: Diagnosis not present

## 2017-08-29 DIAGNOSIS — R269 Unspecified abnormalities of gait and mobility: Secondary | ICD-10-CM | POA: Diagnosis not present

## 2017-08-30 NOTE — Progress Notes (Signed)
   Subjective:  Patient presents today status post left foot surgery. DOS: 07/31/17. She is currently in physical therapy three times weekly. She states the foot feels good most of the time but she is experiencing intermittent sharp pain. She reports taking the pain medication at night and sometimes after physical therapy. Patient is here for further evaluation and treatment.   Past Medical History:  Diagnosis Date  . Allergy   . Asthmatic bronchitis   . Cancer (HCC)    skin cancer- squamous cell- scalp, right wrist  . Constipation    chronic  . COPD (chronic obstructive pulmonary disease) ([place])   . Diverticulosis of colon   . DJD (degenerative joint disease)   . Glaucoma 1977   EYE SURGERY  FOR TX OF GLAUCOMA--NO LONGER HAS GLAUCOMA  . Hepatic cyst    s/p surgical intervention 05/2011 -[facility]  . Hiatal hernia   . Hx of colonic polyps   . Hypertension   . Kidney stone   . Lipoma   . Medullary sponge kidney   . Nontoxic multinodular goiter    pt unsure of this  . Obesity   . Osteoporosis   . Renal calculus    s/p lithotripsy 06/2011  . Restless leg syndrome   . Shingles 2009  . Venous insufficiency   . Vitamin D deficiency       Objective/Physical Exam Skin incisions appear to be healed. No sign of infectious process noted. No dehiscence. No active bleeding noted. No edema noted to the surgical extremity.   Radiographic Exam:  Osteotomies sites appear to be stable with routine healing.  Assessment: 1. s/p left foot surgery. DOS: 07/31/17   Plan of Care:  1. Patient was evaluated. X-Rays reviewed.  2. Refill prescription for Percocet 5/325 mg provided to patient. 3. Transition out of CAM boot into regular shoes. 4. Continue physical therapy. 5. Return to clinic in 4 weeks for surgical consult for right foot surgery.   Edrick Kins, DPM [facility]  Dr. Edrick Kins, [place]                                          [place], [place] [address]                Office [phone]  Fax [phone]

## 2017-09-01 DIAGNOSIS — M25675 Stiffness of left foot, not elsewhere classified: Secondary | ICD-10-CM | POA: Diagnosis not present

## 2017-09-01 DIAGNOSIS — M25572 Pain in left ankle and joints of left foot: Secondary | ICD-10-CM | POA: Diagnosis not present

## 2017-09-01 DIAGNOSIS — R269 Unspecified abnormalities of gait and mobility: Secondary | ICD-10-CM | POA: Diagnosis not present

## 2017-09-01 DIAGNOSIS — M25475 Effusion, left foot: Secondary | ICD-10-CM | POA: Diagnosis not present

## 2017-09-03 DIAGNOSIS — M25572 Pain in left ankle and joints of left foot: Secondary | ICD-10-CM | POA: Diagnosis not present

## 2017-09-03 DIAGNOSIS — R269 Unspecified abnormalities of gait and mobility: Secondary | ICD-10-CM | POA: Diagnosis not present

## 2017-09-03 DIAGNOSIS — M25475 Effusion, left foot: Secondary | ICD-10-CM | POA: Diagnosis not present

## 2017-09-03 DIAGNOSIS — M25675 Stiffness of left foot, not elsewhere classified: Secondary | ICD-10-CM | POA: Diagnosis not present

## 2017-09-04 ENCOUNTER — Ambulatory Visit: Payer: Medicare Other | Admitting: Internal Medicine

## 2017-09-05 DIAGNOSIS — M25475 Effusion, left foot: Secondary | ICD-10-CM | POA: Diagnosis not present

## 2017-09-05 DIAGNOSIS — M25572 Pain in left ankle and joints of left foot: Secondary | ICD-10-CM | POA: Diagnosis not present

## 2017-09-05 DIAGNOSIS — M25675 Stiffness of left foot, not elsewhere classified: Secondary | ICD-10-CM | POA: Diagnosis not present

## 2017-09-05 DIAGNOSIS — R269 Unspecified abnormalities of gait and mobility: Secondary | ICD-10-CM | POA: Diagnosis not present

## 2017-09-05 NOTE — Progress Notes (Signed)
DOS 12.27.18 Exostectomy 5th metatarsal Lt, repair peroneal tendon Lt

## 2017-09-08 DIAGNOSIS — M25675 Stiffness of left foot, not elsewhere classified: Secondary | ICD-10-CM | POA: Diagnosis not present

## 2017-09-08 DIAGNOSIS — R269 Unspecified abnormalities of gait and mobility: Secondary | ICD-10-CM | POA: Diagnosis not present

## 2017-09-08 DIAGNOSIS — M25475 Effusion, left foot: Secondary | ICD-10-CM | POA: Diagnosis not present

## 2017-09-08 DIAGNOSIS — M25572 Pain in left ankle and joints of left foot: Secondary | ICD-10-CM | POA: Diagnosis not present

## 2017-09-09 DIAGNOSIS — H5315 Visual distortions of shape and size: Secondary | ICD-10-CM | POA: Diagnosis not present

## 2017-09-09 DIAGNOSIS — Z961 Presence of intraocular lens: Secondary | ICD-10-CM | POA: Diagnosis not present

## 2017-09-09 DIAGNOSIS — H353131 Nonexudative age-related macular degeneration, bilateral, early dry stage: Secondary | ICD-10-CM | POA: Diagnosis not present

## 2017-09-09 DIAGNOSIS — H35373 Puckering of macula, bilateral: Secondary | ICD-10-CM | POA: Diagnosis not present

## 2017-09-09 DIAGNOSIS — H43813 Vitreous degeneration, bilateral: Secondary | ICD-10-CM | POA: Diagnosis not present

## 2017-09-09 DIAGNOSIS — H401132 Primary open-angle glaucoma, bilateral, moderate stage: Secondary | ICD-10-CM | POA: Diagnosis not present

## 2017-09-10 ENCOUNTER — Other Ambulatory Visit: Payer: Self-pay | Admitting: Internal Medicine

## 2017-09-10 DIAGNOSIS — M25475 Effusion, left foot: Secondary | ICD-10-CM | POA: Diagnosis not present

## 2017-09-10 DIAGNOSIS — R269 Unspecified abnormalities of gait and mobility: Secondary | ICD-10-CM | POA: Diagnosis not present

## 2017-09-10 DIAGNOSIS — M25572 Pain in left ankle and joints of left foot: Secondary | ICD-10-CM | POA: Diagnosis not present

## 2017-09-10 DIAGNOSIS — M25675 Stiffness of left foot, not elsewhere classified: Secondary | ICD-10-CM | POA: Diagnosis not present

## 2017-09-12 DIAGNOSIS — M25475 Effusion, left foot: Secondary | ICD-10-CM | POA: Diagnosis not present

## 2017-09-12 DIAGNOSIS — R269 Unspecified abnormalities of gait and mobility: Secondary | ICD-10-CM | POA: Diagnosis not present

## 2017-09-12 DIAGNOSIS — M25675 Stiffness of left foot, not elsewhere classified: Secondary | ICD-10-CM | POA: Diagnosis not present

## 2017-09-12 DIAGNOSIS — M25572 Pain in left ankle and joints of left foot: Secondary | ICD-10-CM | POA: Diagnosis not present

## 2017-09-15 DIAGNOSIS — M25475 Effusion, left foot: Secondary | ICD-10-CM | POA: Diagnosis not present

## 2017-09-15 DIAGNOSIS — M25675 Stiffness of left foot, not elsewhere classified: Secondary | ICD-10-CM | POA: Diagnosis not present

## 2017-09-15 DIAGNOSIS — R269 Unspecified abnormalities of gait and mobility: Secondary | ICD-10-CM | POA: Diagnosis not present

## 2017-09-15 DIAGNOSIS — M25572 Pain in left ankle and joints of left foot: Secondary | ICD-10-CM | POA: Diagnosis not present

## 2017-09-17 DIAGNOSIS — R269 Unspecified abnormalities of gait and mobility: Secondary | ICD-10-CM | POA: Diagnosis not present

## 2017-09-17 DIAGNOSIS — M25675 Stiffness of left foot, not elsewhere classified: Secondary | ICD-10-CM | POA: Diagnosis not present

## 2017-09-17 DIAGNOSIS — M25572 Pain in left ankle and joints of left foot: Secondary | ICD-10-CM | POA: Diagnosis not present

## 2017-09-17 DIAGNOSIS — M25475 Effusion, left foot: Secondary | ICD-10-CM | POA: Diagnosis not present

## 2017-09-24 ENCOUNTER — Encounter: Payer: Self-pay | Admitting: Podiatry

## 2017-09-24 ENCOUNTER — Ambulatory Visit (INDEPENDENT_AMBULATORY_CARE_PROVIDER_SITE_OTHER): Payer: Medicare Other

## 2017-09-24 ENCOUNTER — Ambulatory Visit (INDEPENDENT_AMBULATORY_CARE_PROVIDER_SITE_OTHER): Payer: Medicare Other | Admitting: Podiatry

## 2017-09-24 DIAGNOSIS — M7671 Peroneal tendinitis, right leg: Secondary | ICD-10-CM

## 2017-09-24 DIAGNOSIS — M898X7 Other specified disorders of bone, ankle and foot: Secondary | ICD-10-CM

## 2017-09-24 DIAGNOSIS — Z9889 Other specified postprocedural states: Secondary | ICD-10-CM

## 2017-09-26 NOTE — Progress Notes (Signed)
   Subjective:  Patient presents today status post left foot surgery. DOS: 07/31/17. She states the left foot is improving. She has now completed physical therapy. She now needs a surgical consultation for the right foot. Patient is here for further evaluation and treatment.   Past Medical History:  Diagnosis Date  . Allergy   . Asthmatic bronchitis   . Cancer (HCC)    skin cancer- squamous cell- scalp, right wrist  . Constipation    chronic  . COPD (chronic obstructive pulmonary disease) ([place])   . Diverticulosis of colon   . DJD (degenerative joint disease)   . Glaucoma 1977   EYE SURGERY  FOR TX OF GLAUCOMA--NO LONGER HAS GLAUCOMA  . Hepatic cyst    s/p surgical intervention 05/2011 -[facility]  . Hiatal hernia   . Hx of colonic polyps   . Hypertension   . Kidney stone   . Lipoma   . Medullary sponge kidney   . Nontoxic multinodular goiter    pt unsure of this  . Obesity   . Osteoporosis   . Renal calculus    s/p lithotripsy 06/2011  . Restless leg syndrome   . Shingles 2009  . Venous insufficiency   . Vitamin D deficiency       Objective/Physical Exam Skin incisions appear to be healed. No sign of infectious process noted. No dehiscence. No active bleeding noted. No edema noted to the surgical extremity. Pain with palpation to the peroneal tendons of the right foot.   Radiographic Exam:  Osteotomies sites appear to be stable with routine healing.  Assessment: 1. s/p left foot surgery. DOS: 07/31/17 2. 5th metatarsal exostosis right 3. Peroneal tendinitis right    Plan of Care:  1. Patient was evaluated. X-Rays reviewed.  2. Left foot doing well. May resume full activity with no restrictions of the left foot.  3. Today we discussed the conservative versus surgical management of the presenting pathology. The patient opts for surgical management. All possible complications and details of the procedure were explained. All patient questions were answered. No guarantees  were expressed or implied. 4. Authorization for surgery was initiated today. Surgery will consist of same procedure as left foot: - 5th metatarsal tubercle exostectomy right - repair peroneal tendon right 5. Return to clinic one week post op.    Edrick Kins, DPM [facility]  Dr. Edrick Kins, [place]                                        [place], [place] [address]                Office [phone]  Fax [phone]

## 2017-10-09 DIAGNOSIS — N3941 Urge incontinence: Secondary | ICD-10-CM | POA: Diagnosis not present

## 2017-10-09 DIAGNOSIS — N200 Calculus of kidney: Secondary | ICD-10-CM | POA: Diagnosis not present

## 2017-10-09 DIAGNOSIS — N390 Urinary tract infection, site not specified: Secondary | ICD-10-CM | POA: Diagnosis not present

## 2017-10-13 ENCOUNTER — Other Ambulatory Visit: Payer: Self-pay | Admitting: Internal Medicine

## 2017-10-13 MED ORDER — ALBUTEROL SULFATE (2.5 MG/3ML) 0.083% IN NEBU: INHALATION_SOLUTION | RESPIRATORY_TRACT | 3 refills | Status: DC

## 2017-10-13 NOTE — Addendum Note (Signed)
Addended by: Amado Coe on: 10/13/2017 03:34 PM   Modules accepted: Orders

## 2017-10-14 ENCOUNTER — Encounter (HOSPITAL_COMMUNITY): Payer: Self-pay

## 2017-10-14 ENCOUNTER — Inpatient Hospital Stay (HOSPITAL_COMMUNITY)
Admission: EM | Admit: 2017-10-14 | Discharge: 2017-10-17 | DRG: 353 | Disposition: A | Discharge status: Home / Self Care | Payer: Medicare Other | Attending: Internal Medicine | Admitting: Internal Medicine

## 2017-10-14 ENCOUNTER — Other Ambulatory Visit: Payer: Self-pay

## 2017-10-14 DIAGNOSIS — R1084 Generalized abdominal pain: Secondary | ICD-10-CM | POA: Diagnosis not present

## 2017-10-14 DIAGNOSIS — J9811 Atelectasis: Secondary | ICD-10-CM | POA: Diagnosis not present

## 2017-10-14 DIAGNOSIS — Y92239 Unspecified place in hospital as the place of occurrence of the external cause: Secondary | ICD-10-CM | POA: Diagnosis not present

## 2017-10-14 DIAGNOSIS — K420 Umbilical hernia with obstruction, without gangrene: Principal | ICD-10-CM | POA: Diagnosis present

## 2017-10-14 DIAGNOSIS — K56600 Partial intestinal obstruction, unspecified as to cause: Secondary | ICD-10-CM | POA: Diagnosis present

## 2017-10-14 DIAGNOSIS — J449 Chronic obstructive pulmonary disease, unspecified: Secondary | ICD-10-CM | POA: Diagnosis present

## 2017-10-14 DIAGNOSIS — R0602 Shortness of breath: Secondary | ICD-10-CM

## 2017-10-14 DIAGNOSIS — J95821 Acute postprocedural respiratory failure: Secondary | ICD-10-CM | POA: Diagnosis not present

## 2017-10-14 DIAGNOSIS — I5032 Chronic diastolic (congestive) heart failure: Secondary | ICD-10-CM | POA: Diagnosis present

## 2017-10-14 DIAGNOSIS — J4530 Mild persistent asthma, uncomplicated: Secondary | ICD-10-CM | POA: Diagnosis present

## 2017-10-14 DIAGNOSIS — Z801 Family history of malignant neoplasm of trachea, bronchus and lung: Secondary | ICD-10-CM

## 2017-10-14 DIAGNOSIS — Z6839 Body mass index (BMI) 39.0-39.9, adult: Secondary | ICD-10-CM

## 2017-10-14 DIAGNOSIS — R188 Other ascites: Secondary | ICD-10-CM | POA: Diagnosis present

## 2017-10-14 DIAGNOSIS — N200 Calculus of kidney: Secondary | ICD-10-CM | POA: Diagnosis not present

## 2017-10-14 DIAGNOSIS — K7689 Other specified diseases of liver: Secondary | ICD-10-CM | POA: Diagnosis present

## 2017-10-14 DIAGNOSIS — Z87442 Personal history of urinary calculi: Secondary | ICD-10-CM

## 2017-10-14 DIAGNOSIS — K56609 Unspecified intestinal obstruction, unspecified as to partial versus complete obstruction: Secondary | ICD-10-CM | POA: Diagnosis not present

## 2017-10-14 DIAGNOSIS — I110 Hypertensive heart disease with heart failure: Secondary | ICD-10-CM | POA: Diagnosis present

## 2017-10-14 DIAGNOSIS — Y838 Other surgical procedures as the cause of abnormal reaction of the patient, or of later complication, without mention of misadventure at the time of the procedure: Secondary | ICD-10-CM | POA: Diagnosis not present

## 2017-10-14 DIAGNOSIS — E876 Hypokalemia: Secondary | ICD-10-CM | POA: Diagnosis present

## 2017-10-14 LAB — CBC WITH DIFFERENTIAL/PLATELET
Basophils Absolute: 0 10*3/uL (ref 0.0–0.1)
Basophils Relative: 0 %
Eosinophils Absolute: 0 10*3/uL (ref 0.0–0.7)
Eosinophils Relative: 0 %
HEMATOCRIT: 45.7 % (ref 36.0–46.0)
Hemoglobin: 14.7 g/dL (ref 12.0–15.0)
LYMPHS ABS: 1.3 10*3/uL (ref 0.7–4.0)
LYMPHS PCT: 10 %
MCH: 30.6 pg (ref 26.0–34.0)
MCHC: 32.2 g/dL (ref 30.0–36.0)
MCV: 95 fL (ref 78.0–100.0)
MONO ABS: 1.2 10*3/uL — AB (ref 0.1–1.0)
Monocytes Relative: 10 %
NEUTROS ABS: 10.1 10*3/uL — AB (ref 1.7–7.7)
Neutrophils Relative %: 80 %
Platelets: 315 10*3/uL (ref 150–400)
RBC: 4.81 MIL/uL (ref 3.87–5.11)
RDW: 14.7 % (ref 11.5–15.5)
WBC: 12.6 10*3/uL — ABNORMAL HIGH (ref 4.0–10.5)

## 2017-10-14 LAB — COMPREHENSIVE METABOLIC PANEL
ALT: 14 U/L (ref 14–54)
AST: 25 U/L (ref 15–41)
Albumin: 3.8 g/dL (ref 3.5–5.0)
Alkaline Phosphatase: 122 U/L (ref 38–126)
Anion gap: 15 (ref 5–15)
BILIRUBIN TOTAL: 1.2 mg/dL (ref 0.3–1.2)
BUN: 12 mg/dL (ref 6–20)
CO2: 31 mmol/L (ref 22–32)
CREATININE: 0.76 mg/dL (ref 0.44–1.00)
Calcium: 9.8 mg/dL (ref 8.9–10.3)
Chloride: 94 mmol/L — ABNORMAL LOW (ref 101–111)
GFR calc non Af Amer: >60 mL/min (ref >60)
Glucose, Bld: 143 mg/dL — ABNORMAL HIGH (ref 65–99)
POTASSIUM: 3.3 mmol/L — AB (ref 3.5–5.1)
Sodium: 140 mmol/L (ref 135–145)
TOTAL PROTEIN: 7.2 g/dL (ref 6.5–8.1)

## 2017-10-14 LAB — I-STAT CG4 LACTIC ACID, ED: LACTIC ACID, VENOUS: 1.6 mmol/L (ref 0.5–1.9)

## 2017-10-14 MED ORDER — FENTANYL CITRATE (PF) 100 MCG/2ML IJ SOLN
50.0000 ug | Freq: Once | INTRAMUSCULAR | Status: AC
  Administered 2017-10-14: 50 ug via INTRAVENOUS
  Filled 2017-10-14: qty 2

## 2017-10-14 MED ORDER — ONDANSETRON HCL 4 MG/2ML IJ SOLN
4.0000 mg | Freq: Once | INTRAMUSCULAR | Status: AC
  Administered 2017-10-14: 4 mg via INTRAVENOUS
  Filled 2017-10-14: qty 2

## 2017-10-14 NOTE — ED Provider Notes (Signed)
[facility] EMERGENCY DEPARTMENT Provider Note   CSN: [id number] Arrival date & time: 10/14/17  2208     History   Chief Complaint Chief Complaint  Patient presents with  . Constipation    HPI Lisa Hamilton is a 72 y.o. female.  Patient with PMH of umbilical hernia presents to the ED with a chief complaint of abdominal pain and constipation since Friday.  She reports associated nausea and vomiting today.  She states that the pain has been severe.  She denies any fevers or chills.  She has tried taking OTC stool softeners and enema and suppository with no relief.  She denies any other associated symptoms.   The history is provided by the patient. No language interpreter was used.    Past Medical History:  Diagnosis Date  . Allergy   . Asthmatic bronchitis   . Cancer (HCC)    skin cancer- squamous cell- scalp, right wrist  . Constipation    chronic  . COPD (chronic obstructive pulmonary disease) ([place])   . Diverticulosis of colon   . DJD (degenerative joint disease)   . Glaucoma 1977   EYE SURGERY  FOR TX OF GLAUCOMA--NO LONGER HAS GLAUCOMA  . Hepatic cyst    s/p surgical intervention 05/2011 -[place]  . Hiatal hernia   . Hx of colonic polyps   . Hypertension   . Kidney stone   . Lipoma   . Medullary sponge kidney   . Nontoxic multinodular goiter    pt unsure of this  . Obesity   . Osteoporosis   . Renal calculus    s/p lithotripsy 06/2011  . Restless leg syndrome   . Shingles 2009  . Venous insufficiency   . Vitamin D deficiency     Patient Active Problem List   Diagnosis Date Noted  . Bilateral lower extremity edema 02/21/2016  . Anemia 12/22/2015  . Hyperglycemia 01/04/2014  . NONTOXIC MULTINODULAR GOITER 08/06/2008  . HYPERCHOLESTEROLEMIA 08/06/2008  . RESTLESS LEG SYNDROME 08/06/2008  . Essential hypertension 08/06/2008  . Liver cyst 08/06/2008  . MEDULLARY SPONGE KIDNEY 08/06/2008  . VITAMIN D DEFICIENCY 10/21/2007  . Morbid  obesity ([place]) 10/21/2007  . GLAUCOMA 10/21/2007  . Asthma in adult, mild persistent, uncomplicated 99/24/2683  . Osteoarthritis 10/21/2007  . Osteoporosis 10/21/2007    Past Surgical History:  Procedure Laterality Date  . bilateral carpel tunnel repairs     Dr. Shellia Carwin  left--1999   right--2008  . bilateral wrist fracture, right  1996   left 2001, Dr. Daylene Katayama  . CATARACT EXTRACTION  04/07/13 R, 05/18/13 L   shapiro  . COLONOSCOPY    . eye surgery Left 11/22/2014   "MD pucker" repair  . [place]  . [place]  . HERNIA REPAIR  2012   umb hernia  . LAPAROSCOPIC LIVER CYST FENESTRATION  05/2011  . LIPOMA EXCISION  08/13/2012   Procedure: EXCISION LIPOMA;  Surgeon: Magnus Sinning, MD;  Location: [facility] ORS;  Service: Orthopedics;  Laterality: Left;  . SHOULDER OPEN ROTATOR CUFF REPAIR  08/13/2012   Procedure: ROTATOR CUFF REPAIR SHOULDER OPEN;  Surgeon: Magnus Sinning, MD;  Location: [facility] ORS;  Service: Orthopedics;  Laterality: Left;  LEFT SHOULDER ANTERIOR ACROMINECTOMY AND ROTATOR CUFF REPAIR AND EXCISION LEFT SHOULDER LIPOMA  . SQUAMOUS CELL CARCINOMA EXCISION  2010   L [place]  . TONSILLECTOMY  1967    OB History    No data available  Home Medications    Prior to Admission medications   Medication Sig Start Date End Date Taking? Authorizing Provider  albuterol (PROVENTIL) (2.5 MG/3ML) 0.083% nebulizer solution USE 1 VIAL IN NEBULIZER 4 TIMES DAILY. Generic: VENTOLIN 10/13/17   Baird Lyons D, MD  aspirin 81 MG tablet Take 2 tablets by mouth at bedtime    [provider]  bisacodyl (DULCOLAX) 5 MG EC tablet Take 5 mg by mouth daily as needed. For constipation    [provider]  Calcium Carbonate-Vitamin D 600-400 MG-UNIT per tablet Take 1 tablet by mouth 3 (three) times daily.    [provider]  Cholecalciferol (VITAMIN D) 1000 UNITS capsule Take 2,000 Units by mouth daily. VITAMIN D 3    [provider]    clonazePAM (KLONOPIN) 0.5 MG tablet 1-3 taken 30 minutes before bed 08/18/17   Baird Lyons D, MD  Co-Enzyme Q-10 100 MG CAPS Take 200 mg by mouth daily.     [provider]  furosemide (LASIX) 40 MG tablet Take 1 tablet (40 mg total) by mouth 3 (three) times daily. 07/08/17   Hoyt Koch, MD  gabapentin (NEURONTIN) 300 MG capsule TAKE 1 CAPSULE BY MOUTH 3  TIMES DAILY 09/10/17   Baird Lyons D, MD  guaiFENesin (MUCINEX) 600 MG 12 hr tablet Take 600 mg by mouth 2 (two) times daily as needed. Reported on 12/22/2015    [provider]  ibuprofen (ADVIL,MOTRIN) 200 MG tablet Take 200 mg by mouth every 6 (six) hours as needed.    [provider]  Astrid Drafts Ultra Strength 1500 MG CAPS Take 1,500 mg by mouth daily.    [provider]  losartan (COZAAR) 100 MG tablet Take 1 tablet (100 mg total) by mouth daily. 07/08/17   Hoyt Koch, MD  Magnesium 400 MG CAPS Take 400 mg by mouth 2 (two) times daily.     [provider]  montelukast (SINGULAIR) 10 MG tablet Take 1 tablet (10 mg total) by mouth at bedtime. 07/08/17   Hoyt Koch, MD  Multiple Vitamin (MULTIVITAMIN) capsule Take 1 capsule by mouth daily. Iron free    [provider]  oxyCODONE-acetaminophen (ROXICET) 5-325 MG tablet Take 1 tablet by mouth every 8 (eight) hours as needed for severe pain. 08/27/17   Edrick Kins, DPM  potassium chloride SA (K-DUR,KLOR-CON) 20 MEQ tablet Take 2 tablets (40 mEq total) by mouth daily. 07/11/17   Hoyt Koch, MD  pravastatin (PRAVACHOL) 40 MG tablet Take 1 tablet (40 mg total) by mouth daily. 07/08/17   Hoyt Koch, MD  Probiotic Product (PROBIOTIC DAILY PO) Take 1 tablet by mouth 2 (two) times daily.    [provider]  rOPINIRole (REQUIP) 0.5 MG tablet Up to 6 daily as needed 08/18/17   Baird Lyons D, MD  theophylline (THEODUR) 300 MG 12 hr tablet Take 1 tablet by mouth two  times daily 10/26/15    Hoyt Koch, MD  traMADol (ULTRAM) 50 MG tablet Take 1 tablet (50 mg total) by mouth daily as needed. 07/08/17   Hoyt Koch, MD  triamcinolone cream (KENALOG) 0.1 % Apply 1 application topically 2 (two) times daily. 06/25/16   Hoyt Koch, MD    Family History Family History  Problem Relation Age of Onset  . Lung cancer Father   . Colon cancer Father        possible colon cancer, but unsure  . Lung cancer Sister   .  Irritable bowel syndrome Sister   . Obstructive Sleep Apnea Sister   . Osteoarthritis Sister   . Esophageal cancer Neg Hx   . Rectal cancer Neg Hx   . Stomach cancer Neg Hx     Social History Social History   Tobacco Use  . Smoking status: Never Smoker  . Smokeless tobacco: Never Used  Substance Use Topics  . Alcohol use: Yes    Alcohol/week: 0.0 oz    Comment: social use  . Drug use: No     Allergies   Adhesive [tape]; Codeine; Amoxicillin-pot clavulanate; and Penicillins   Review of Systems Review of Systems  All other systems reviewed and are negative.    Physical Exam Updated Vital Signs BP 129/71 (BP Location: Left Arm)   Pulse (!) 117   Temp 98.9 F (37.2 C) (Oral)   Ht 5' (1.524 m)   Wt 90.7 kg (200 lb)   SpO2 96%   BMI 39.06 kg/m   Physical Exam  Constitutional: She is oriented to person, place, and time. She appears well-developed and well-nourished.  HENT:  Head: Normocephalic and atraumatic.  Eyes: Conjunctivae and EOM are normal. Pupils are equal, round, and reactive to light.  Neck: Normal range of motion. Neck supple.  Cardiovascular: Normal rate and regular rhythm. Exam reveals no gallop and no friction rub.  No murmur heard. Pulmonary/Chest: Effort normal and breath sounds normal. No respiratory distress. She has no wheezes. She has no rales. She exhibits no tenderness.  Abdominal: Soft. She exhibits no distension and no mass. There is no tenderness. There is no rebound and no guarding.  Quiet  bowel sounds in upper abdomen Generalized abdominal discomfort No palpable hernia on my exam (reduced by Dr. Venora Maples in triage)  Musculoskeletal: Normal range of motion. She exhibits no edema or tenderness.  Neurological: She is alert and oriented to person, place, and time.  Skin: Skin is warm and dry.  Psychiatric: She has a normal mood and affect. Her behavior is normal. Judgment and thought content normal.  Nursing note and vitals reviewed.    ED Treatments / Results  Labs (all labs ordered are listed, but only abnormal results are displayed) Labs Reviewed - No data to display  EKG  EKG Interpretation None       Radiology No results found.  Procedures Hernia reduction Date/Time: 10/14/2017 10:48 PM Performed by: Jola Schmidt, MD Authorized by: Jola Schmidt, MD  Consent: Verbal consent obtained. Risks and benefits: risks, benefits and alternatives were discussed Consent given by: patient Patient understanding: patient states understanding of the procedure being performed Patient consent: the patient's understanding of the procedure matches consent given Procedure consent: procedure consent matches procedure scheduled Relevant documents: relevant documents present and verified Test results: test results available and properly labeled Site marked: the operative site was marked Imaging studies: imaging studies available Required items: required blood products, implants, devices, and special equipment available Patient identity confirmed: verbally with patient Time out: Immediately prior to procedure a "time out" was called to verify the correct patient, procedure, equipment, support staff and site/side marked as required. Preparation: Patient was prepped and draped in the usual sterile fashion. Local anesthesia used: no  Anesthesia: Local anesthesia used: no  Sedation: Patient sedated: no  Patient tolerance: Patient tolerated the procedure well with no immediate  complications    (including critical care time)  Medications Ordered in ED Medications - No data to display   Initial Impression / Assessment and Plan / ED Course  I have reviewed the triage vital signs and the nursing notes.  Pertinent labs & imaging results that were available during my care of the patient were reviewed by me and considered in my medical decision making (see chart for details).     Patient with abdominal pain with associated n/v and lack of significant BM.  Periumbilical hernia reduced by Dr. Venora Maples in triage.  Still has significant abdominal tenderness and discomfort.  Will get CT and treat pain.  Will reassess.  Moderate leukocytosis of 12.6.  No clear source for infection.  Discussed with Dr. Donne Hazel, who will consult.  Recommends NG tube placement and Hospitalist admission. 2:03 AM Appreciate Dr. Tamala Julian from [facility] for admitting the patient.  Final Clinical Impressions(s) / ED Diagnoses   Final diagnoses:  Small bowel obstruction [facility])    ED Discharge Orders    None       Montine Circle, PA-C 10/15/17 [id number]    Jola Schmidt, MD 10/17/17 [phone]

## 2017-10-14 NOTE — ED Notes (Signed)
Started pt on 2L 02 to maintain sats

## 2017-10-14 NOTE — ED Triage Notes (Signed)
Patient c/o not having a normal bowel movement since 11/06/2022 (4 days ago). Pronounced hernia umbilical area. And began vomiting today. C/o abd pain 7/10.

## 2017-10-15 ENCOUNTER — Telehealth: Payer: Self-pay | Admitting: *Deleted

## 2017-10-15 ENCOUNTER — Observation Stay (HOSPITAL_COMMUNITY): Payer: Medicare Other

## 2017-10-15 ENCOUNTER — Emergency Department (HOSPITAL_COMMUNITY): Payer: Medicare Other

## 2017-10-15 ENCOUNTER — Encounter (HOSPITAL_COMMUNITY)
Admission: EM | Disposition: A | Discharge status: Home / Self Care | Payer: Self-pay | Source: Home / Self Care | Attending: Internal Medicine

## 2017-10-15 ENCOUNTER — Observation Stay (HOSPITAL_COMMUNITY): Payer: Medicare Other | Admitting: Anesthesiology

## 2017-10-15 ENCOUNTER — Encounter (HOSPITAL_COMMUNITY): Payer: Self-pay | Admitting: *Deleted

## 2017-10-15 DIAGNOSIS — E876 Hypokalemia: Secondary | ICD-10-CM | POA: Diagnosis present

## 2017-10-15 DIAGNOSIS — K56609 Unspecified intestinal obstruction, unspecified as to partial versus complete obstruction: Secondary | ICD-10-CM | POA: Diagnosis not present

## 2017-10-15 DIAGNOSIS — Y838 Other surgical procedures as the cause of abnormal reaction of the patient, or of later complication, without mention of misadventure at the time of the procedure: Secondary | ICD-10-CM | POA: Diagnosis not present

## 2017-10-15 DIAGNOSIS — R188 Other ascites: Secondary | ICD-10-CM | POA: Diagnosis present

## 2017-10-15 DIAGNOSIS — K420 Umbilical hernia with obstruction, without gangrene: Secondary | ICD-10-CM | POA: Diagnosis not present

## 2017-10-15 DIAGNOSIS — Z6839 Body mass index (BMI) 39.0-39.9, adult: Secondary | ICD-10-CM | POA: Diagnosis not present

## 2017-10-15 DIAGNOSIS — J9811 Atelectasis: Secondary | ICD-10-CM | POA: Diagnosis not present

## 2017-10-15 DIAGNOSIS — R0602 Shortness of breath: Secondary | ICD-10-CM | POA: Diagnosis not present

## 2017-10-15 DIAGNOSIS — K56600 Partial intestinal obstruction, unspecified as to cause: Secondary | ICD-10-CM | POA: Diagnosis not present

## 2017-10-15 DIAGNOSIS — I5032 Chronic diastolic (congestive) heart failure: Secondary | ICD-10-CM | POA: Diagnosis present

## 2017-10-15 DIAGNOSIS — J449 Chronic obstructive pulmonary disease, unspecified: Secondary | ICD-10-CM | POA: Diagnosis present

## 2017-10-15 DIAGNOSIS — N200 Calculus of kidney: Secondary | ICD-10-CM

## 2017-10-15 DIAGNOSIS — K436 Other and unspecified ventral hernia with obstruction, without gangrene: Secondary | ICD-10-CM | POA: Diagnosis not present

## 2017-10-15 DIAGNOSIS — K7689 Other specified diseases of liver: Secondary | ICD-10-CM | POA: Diagnosis present

## 2017-10-15 DIAGNOSIS — Z87442 Personal history of urinary calculi: Secondary | ICD-10-CM | POA: Diagnosis not present

## 2017-10-15 DIAGNOSIS — J4530 Mild persistent asthma, uncomplicated: Secondary | ICD-10-CM

## 2017-10-15 DIAGNOSIS — Z4682 Encounter for fitting and adjustment of non-vascular catheter: Secondary | ICD-10-CM | POA: Diagnosis not present

## 2017-10-15 DIAGNOSIS — Z801 Family history of malignant neoplasm of trachea, bronchus and lung: Secondary | ICD-10-CM | POA: Diagnosis not present

## 2017-10-15 DIAGNOSIS — D649 Anemia, unspecified: Secondary | ICD-10-CM | POA: Diagnosis not present

## 2017-10-15 DIAGNOSIS — Y92239 Unspecified place in hospital as the place of occurrence of the external cause: Secondary | ICD-10-CM | POA: Diagnosis not present

## 2017-10-15 DIAGNOSIS — I10 Essential (primary) hypertension: Secondary | ICD-10-CM | POA: Diagnosis not present

## 2017-10-15 DIAGNOSIS — J95821 Acute postprocedural respiratory failure: Secondary | ICD-10-CM | POA: Diagnosis not present

## 2017-10-15 DIAGNOSIS — K567 Ileus, unspecified: Secondary | ICD-10-CM | POA: Diagnosis not present

## 2017-10-15 DIAGNOSIS — I110 Hypertensive heart disease with heart failure: Secondary | ICD-10-CM | POA: Diagnosis present

## 2017-10-15 HISTORY — PX: VENTRAL HERNIA REPAIR: SHX424

## 2017-10-15 HISTORY — DX: Partial intestinal obstruction, unspecified as to cause: K56.600

## 2017-10-15 HISTORY — PX: INSERTION OF MESH: SHX5868

## 2017-10-15 LAB — BASIC METABOLIC PANEL
Anion gap: 13 (ref 5–15)
BUN: 11 mg/dL (ref 6–20)
CO2: 35 mmol/L — AB (ref 22–32)
Calcium: 9.6 mg/dL (ref 8.9–10.3)
Chloride: 95 mmol/L — ABNORMAL LOW (ref 101–111)
Creatinine, Ser: 0.7 mg/dL (ref 0.44–1.00)
GFR calc Af Amer: >60 mL/min (ref >60)
GLUCOSE: 134 mg/dL — AB (ref 65–99)
POTASSIUM: 2.9 mmol/L — AB (ref 3.5–5.1)
Sodium: 143 mmol/L (ref 135–145)

## 2017-10-15 LAB — I-STAT CG4 LACTIC ACID, ED: LACTIC ACID, VENOUS: 1.04 mmol/L (ref 0.5–1.9)

## 2017-10-15 LAB — CBC
HEMATOCRIT: 45.4 % (ref 36.0–46.0)
Hemoglobin: 14.1 g/dL (ref 12.0–15.0)
MCH: 30.2 pg (ref 26.0–34.0)
MCHC: 31.1 g/dL (ref 30.0–36.0)
MCV: 97.2 fL (ref 78.0–100.0)
PLATELETS: 285 10*3/uL (ref 150–400)
RBC: 4.67 MIL/uL (ref 3.87–5.11)
RDW: 15 % (ref 11.5–15.5)
WBC: 11.7 10*3/uL — ABNORMAL HIGH (ref 4.0–10.5)

## 2017-10-15 LAB — MAGNESIUM: Magnesium: 2.1 mg/dL (ref 1.7–2.4)

## 2017-10-15 SURGERY — REPAIR, HERNIA, VENTRAL
Anesthesia: General

## 2017-10-15 MED ORDER — LIDOCAINE HCL (CARDIAC) 20 MG/ML IV SOLN
INTRAVENOUS | Status: AC
  Filled 2017-10-15: qty 5

## 2017-10-15 MED ORDER — PROPOFOL 10 MG/ML IV BOLUS
INTRAVENOUS | Status: AC
  Filled 2017-10-15: qty 20

## 2017-10-15 MED ORDER — ALBUTEROL SULFATE (2.5 MG/3ML) 0.083% IN NEBU: 2.5000 mg | INHALATION_SOLUTION | RESPIRATORY_TRACT | Status: DC | PRN

## 2017-10-15 MED ORDER — PROMETHAZINE HCL 25 MG/ML IJ SOLN: 6.2500 mg | INTRAMUSCULAR | Status: DC | PRN

## 2017-10-15 MED ORDER — ALBUTEROL SULFATE (2.5 MG/3ML) 0.083% IN NEBU
2.5000 mg | INHALATION_SOLUTION | Freq: Four times a day (QID) | RESPIRATORY_TRACT | Status: DC
  Administered 2017-10-15 – 2017-10-17 (×6): 2.5 mg via RESPIRATORY_TRACT
  Filled 2017-10-15 (×6): qty 3

## 2017-10-15 MED ORDER — LACTATED RINGERS IV SOLN
INTRAVENOUS | Status: DC
  Administered 2017-10-15 (×2): via INTRAVENOUS

## 2017-10-15 MED ORDER — FENTANYL CITRATE (PF) 250 MCG/5ML IJ SOLN
INTRAMUSCULAR | Status: AC
  Filled 2017-10-15: qty 5

## 2017-10-15 MED ORDER — ONDANSETRON HCL 4 MG PO TABS: 4.0000 mg | ORAL_TABLET | Freq: Four times a day (QID) | ORAL | Status: DC | PRN

## 2017-10-15 MED ORDER — HYDROMORPHONE HCL 1 MG/ML IJ SOLN: 0.2500 mg | INTRAMUSCULAR | Status: DC | PRN

## 2017-10-15 MED ORDER — ACETAMINOPHEN 650 MG RE SUPP: 650.0000 mg | Freq: Four times a day (QID) | RECTAL | Status: DC | PRN

## 2017-10-15 MED ORDER — FENTANYL CITRATE (PF) 100 MCG/2ML IJ SOLN
INTRAMUSCULAR | Status: DC | PRN
  Administered 2017-10-15: 100 ug via INTRAVENOUS

## 2017-10-15 MED ORDER — IOPAMIDOL (ISOVUE-300) INJECTION 61%
INTRAVENOUS | Status: AC
  Administered 2017-10-15: 100 mL
  Filled 2017-10-15: qty 100

## 2017-10-15 MED ORDER — PROPOFOL 10 MG/ML IV BOLUS
INTRAVENOUS | Status: DC | PRN
  Administered 2017-10-15: 120 mg via INTRAVENOUS

## 2017-10-15 MED ORDER — CEFAZOLIN SODIUM-DEXTROSE 2-4 GM/100ML-% IV SOLN
2.0000 g | Freq: Once | INTRAVENOUS | Status: AC
  Administered 2017-10-15: 2 g via INTRAVENOUS
  Filled 2017-10-15: qty 100

## 2017-10-15 MED ORDER — ENOXAPARIN SODIUM 40 MG/0.4ML ~~LOC~~ SOLN
40.0000 mg | SUBCUTANEOUS | Status: DC
  Administered 2017-10-16 – 2017-10-17 (×2): 40 mg via SUBCUTANEOUS
  Filled 2017-10-15 (×2): qty 0.4

## 2017-10-15 MED ORDER — BUPIVACAINE-EPINEPHRINE 0.25% -1:200000 IJ SOLN
INTRAMUSCULAR | Status: DC | PRN
  Administered 2017-10-15: 10 mL

## 2017-10-15 MED ORDER — ONDANSETRON HCL 4 MG/2ML IJ SOLN
INTRAMUSCULAR | Status: DC | PRN
  Administered 2017-10-15: 4 mg via INTRAVENOUS

## 2017-10-15 MED ORDER — FENTANYL CITRATE (PF) 100 MCG/2ML IJ SOLN
25.0000 ug | INTRAMUSCULAR | Status: DC | PRN
  Administered 2017-10-15 – 2017-10-16 (×4): 25 ug via INTRAVENOUS
  Filled 2017-10-15 (×4): qty 2

## 2017-10-15 MED ORDER — ENOXAPARIN SODIUM 40 MG/0.4ML ~~LOC~~ SOLN
40.0000 mg | SUBCUTANEOUS | Status: DC
  Filled 2017-10-15: qty 0.4

## 2017-10-15 MED ORDER — DEXAMETHASONE SODIUM PHOSPHATE 10 MG/ML IJ SOLN
INTRAMUSCULAR | Status: AC
  Filled 2017-10-15: qty 1

## 2017-10-15 MED ORDER — ACETAMINOPHEN 325 MG PO TABS: 650.0000 mg | ORAL_TABLET | Freq: Four times a day (QID) | ORAL | Status: DC | PRN

## 2017-10-15 MED ORDER — DEXAMETHASONE SODIUM PHOSPHATE 10 MG/ML IJ SOLN
INTRAMUSCULAR | Status: DC | PRN
  Administered 2017-10-15: 10 mg via INTRAVENOUS

## 2017-10-15 MED ORDER — 0.9 % SODIUM CHLORIDE (POUR BTL) OPTIME
TOPICAL | Status: DC | PRN
  Administered 2017-10-15: 1000 mL

## 2017-10-15 MED ORDER — LIDOCAINE HCL (CARDIAC) 20 MG/ML IV SOLN
INTRAVENOUS | Status: DC | PRN
  Administered 2017-10-15: 75 mg via INTRAVENOUS

## 2017-10-15 MED ORDER — POTASSIUM CHLORIDE IN NACL 20-0.9 MEQ/L-% IV SOLN
INTRAVENOUS | Status: DC
  Administered 2017-10-15 (×2): via INTRAVENOUS
  Filled 2017-10-15 (×2): qty 1000

## 2017-10-15 MED ORDER — SUGAMMADEX SODIUM 200 MG/2ML IV SOLN
INTRAVENOUS | Status: DC | PRN
  Administered 2017-10-15: 150 mg via INTRAVENOUS

## 2017-10-15 MED ORDER — ONDANSETRON HCL 4 MG/2ML IJ SOLN
INTRAMUSCULAR | Status: AC
Start: 2017-10-15 — End: ?
  Filled 2017-10-15: qty 2

## 2017-10-15 MED ORDER — POTASSIUM CHLORIDE 10 MEQ/100ML IV SOLN
10.0000 meq | INTRAVENOUS | Status: AC
Start: 2017-10-15 — End: 2017-10-15
  Administered 2017-10-15 (×3): 10 meq via INTRAVENOUS
  Filled 2017-10-15 (×3): qty 100

## 2017-10-15 MED ORDER — SUGAMMADEX SODIUM 200 MG/2ML IV SOLN
INTRAVENOUS | Status: AC
  Filled 2017-10-15: qty 2

## 2017-10-15 MED ORDER — ROCURONIUM BROMIDE 10 MG/ML (PF) SYRINGE
PREFILLED_SYRINGE | INTRAVENOUS | Status: AC
  Filled 2017-10-15: qty 5

## 2017-10-15 MED ORDER — SUCCINYLCHOLINE CHLORIDE 20 MG/ML IJ SOLN
INTRAMUSCULAR | Status: DC | PRN
  Administered 2017-10-15: 120 mg via INTRAVENOUS

## 2017-10-15 MED ORDER — SODIUM CHLORIDE 0.9 % IV SOLN
INTRAVENOUS | Status: DC
  Administered 2017-10-15: 03:00:00 via INTRAVENOUS

## 2017-10-15 MED ORDER — ONDANSETRON HCL 4 MG/2ML IJ SOLN: 4.0000 mg | Freq: Four times a day (QID) | INTRAMUSCULAR | Status: DC | PRN

## 2017-10-15 MED ORDER — DEXTROSE 5 % IV SOLN
INTRAVENOUS | Status: DC | PRN
  Administered 2017-10-15: 60 ug/min via INTRAVENOUS

## 2017-10-15 MED ORDER — ROCURONIUM BROMIDE 100 MG/10ML IV SOLN
INTRAVENOUS | Status: DC | PRN
  Administered 2017-10-15 (×2): 10 mg via INTRAVENOUS
  Administered 2017-10-15: 30 mg via INTRAVENOUS
  Administered 2017-10-15: 10 mg via INTRAVENOUS

## 2017-10-15 MED ORDER — LORAZEPAM 2 MG/ML IJ SOLN
0.5000 mg | Freq: Every day | INTRAMUSCULAR | Status: DC | PRN
  Administered 2017-10-15: 0.5 mg via INTRAVENOUS
  Filled 2017-10-15: qty 1

## 2017-10-15 SURGICAL SUPPLY — 39 items
APL SKNCLS STERI-STRIP NONHPOA (GAUZE/BANDAGES/DRESSINGS) ×1
BENZOIN TINCTURE PRP APPL 2/3 (GAUZE/BANDAGES/DRESSINGS) ×2 IMPLANT
BLADE CLIPPER SURG (BLADE) IMPLANT
CANISTER SUCT 3000ML PPV (MISCELLANEOUS) ×2 IMPLANT
CHLORAPREP W/TINT 26ML (MISCELLANEOUS) ×2 IMPLANT
COVER SURGICAL LIGHT HANDLE (MISCELLANEOUS) ×2 IMPLANT
DRAPE LAPAROSCOPIC ABDOMINAL (DRAPES) ×2 IMPLANT
DRSG TEGADERM 4X4.75 (GAUZE/BANDAGES/DRESSINGS) ×2 IMPLANT
ELECT CAUTERY BLADE 6.4 (BLADE) ×2 IMPLANT
ELECT REM PT RETURN 9FT ADLT (ELECTROSURGICAL) ×2
ELECTRODE REM PT RTRN 9FT ADLT (ELECTROSURGICAL) ×1 IMPLANT
GAUZE SPONGE 4X4 12PLY STRL (GAUZE/BANDAGES/DRESSINGS) ×2 IMPLANT
GLOVE BIO SURGEON STRL SZ7 (GLOVE) ×2 IMPLANT
GLOVE BIOGEL PI IND STRL 7.5 (GLOVE) ×1 IMPLANT
GLOVE BIOGEL PI INDICATOR 7.5 (GLOVE) ×1
GOWN STRL REUS W/ TWL LRG LVL3 (GOWN DISPOSABLE) ×2 IMPLANT
GOWN STRL REUS W/TWL LRG LVL3 (GOWN DISPOSABLE) ×4
HANDLE SUCTION POOLE (INSTRUMENTS) ×1 IMPLANT
KIT BASIN OR (CUSTOM PROCEDURE TRAY) ×2 IMPLANT
KIT ROOM TURNOVER OR (KITS) ×2 IMPLANT
MESH VENTRALEX ST 2.5 CRC MED (Mesh General) ×2 IMPLANT
NEEDLE HYPO 25GX1X1/2 BEV (NEEDLE) ×2 IMPLANT
NS IRRIG 1000ML POUR BTL (IV SOLUTION) ×2 IMPLANT
PACK GENERAL/GYN (CUSTOM PROCEDURE TRAY) ×2 IMPLANT
PAD ARMBOARD 7.5X6 YLW CONV (MISCELLANEOUS) ×2 IMPLANT
STRIP CLOSURE SKIN 1/2X4 (GAUZE/BANDAGES/DRESSINGS) ×2 IMPLANT
SUCTION POOLE HANDLE (INSTRUMENTS) ×2
SUT MNCRL AB 4-0 PS2 18 (SUTURE) ×2 IMPLANT
SUT NOVA NAB DX-16 0-1 5-0 T12 (SUTURE) ×2 IMPLANT
SUT NOVA NAB GS-21 0 18 T12 DT (SUTURE) ×6 IMPLANT
SUT NOVA NAB GS-21 1 T12 (SUTURE) ×2 IMPLANT
SUT SILK 3 0 SH CR/8 (SUTURE) ×2 IMPLANT
SUT VIC AB 3-0 SH 27 (SUTURE) ×2
SUT VIC AB 3-0 SH 27XBRD (SUTURE) ×1 IMPLANT
SUT VIC AB 3-0 SH 8-18 (SUTURE) ×2 IMPLANT
SYR CONTROL 10ML LL (SYRINGE) ×2 IMPLANT
TOWEL OR 17X24 6PK STRL BLUE (TOWEL DISPOSABLE) IMPLANT
TOWEL OR 17X26 10 PK STRL BLUE (TOWEL DISPOSABLE) ×2 IMPLANT
TRAY FOLEY W/METER SILVER 14FR (SET/KITS/TRAYS/PACK) IMPLANT

## 2017-10-15 NOTE — Op Note (Signed)
Ventral Hernia Repair Procedure Note  Indications:  Incarcerated periumbilical ventral hernia - recurrent.  Pre-operative Diagnosis: incarcerated ventral hernia  Post-operative Diagnosis:  Incarcerated ventral hernia  Surgeon: Maia Petties   Assistants: none  Anesthesia: General endotracheal anesthesia  ASA Class: 2  Procedure Details  The patient was seen in the Holding Room. The risks, benefits, complications, treatment options, and expected outcomes were discussed with the patient. The possibilities of reaction to medication, pulmonary aspiration, perforation of viscus, bleeding, recurrent infection, the need for additional procedures, failure to diagnose a condition, and creating a complication requiring transfusion or operation were discussed with the patient. The patient concurred with the proposed plan, giving informed consent.  The site of surgery properly noted/marked. The patient was taken to the operating room, identified as Lisa Hamilton and the procedure verified as ventral hernia repair. A Time Out was held and the above information confirmed.  The patient was placed supine.  After establishing general anesthesia, the abdomen was prepped with Chloraprep and draped in sterile fashion.  We made a transverse incision below the umbilicus over the palpable hernia. Dissection was carried down to the hernia sac.  The hernia sac was densely adherent to the overlying dermis.  We opened the hernia sac and encountered a large amount of ascites.  The patient has a second very small defect just above the umbilical defect.  There is incarcerated fat within this smaller defect.  We reduce this out of the hernia sac.  We then carefully examined the contents of the hernia sac.  This seemed to be an appendices epiploica coming off the transverse colon.  The colon appeared viable with no sign of damage.  The appendices epiploica appeared mildly ischemic.  We removed the ischemic area.  We oversewed  this area with 3-0 silk sutures.  Intact fascia was identified circumferentially around the defect.  The defect measured about 3 cm across.  We used a medium Ventralex mesh and secured this to the fascia with interrupted 0 Novofil sutures.  The fascial defect was reapproximated with interrupted figure-of-8 1 Novofil sutures.  The subcutaneous tissues were irrigated.  I excised some of the excess umbilical skin that appeared ischemic. Hemostasis was confirmed.  The skin incision was closed in layers with 3-0 Vicryl subcutaneous sutures and 4-0 monocryl subcuticular closure.  Steri-Strips were applied at the end of the operation.    Instrument, sponge, and needle counts were correct prior to closure and at the conclusion of the case.   Findings: 2 cm defect/ 1 cm defect - incarcerated appendices epiploicae from transverse colon.  Estimated Blood Loss:  Minimal         Drains: none                      Complications:  none         Disposition: PACU - hemodynamically stable.         Condition: stable  Imogene Burn. Georgette Dover, MD, [facility] Surgery  General/ Trauma Surgery  10/15/2017 11:46 AM

## 2017-10-15 NOTE — ED Notes (Signed)
gen surg MD at bedside to eval pt

## 2017-10-15 NOTE — Telephone Encounter (Signed)
"  Lisa Hamilton called and canceled her surgery for tomorrow.  She was admitted into the hospital for a bowel blockage, constipation."  Thanks for letting me know, I will let Dr. Amalia Hailey know."

## 2017-10-15 NOTE — ED Notes (Signed)
Report given to [facility] in short stay

## 2017-10-15 NOTE — Progress Notes (Signed)
Patient ID: Lisa Hamilton, female   DOB: May 24, 1946, 72 y.o.   MRN: [id number]  Examined patient - intermittently incarcerated periumbilical ventral hernia.  Patient also has several very large recurrent hepatic cysts.  Since, she may need surgery in the future to address these cysts, I am hesitant to put a large piece of mesh into her abdomen.  After discussion with the patient, we will plan to proceed with open ventral hernia repair with mesh to repair the small periumbilical ventral hernia.  She has a very lax abdominal wall, but I do not palpate and the CT scan does not show any other defects.  The surgical procedure has been discussed with the patient.  Potential risks, benefits, alternative treatments, and expected outcomes have been explained.  All of the patient's questions at this time have been answered.  The likelihood of reaching the patient's treatment goal is good.  The patient understand the proposed surgical procedure and wishes to proceed.  Possible discharge in the next 1-2 days.  Imogene Burn. Georgette Dover, MD, [facility] Surgery  General/ Trauma Surgery  10/15/2017 7:55 AM

## 2017-10-15 NOTE — H&P (Signed)
History and Physical    Lisa Hamilton CNO:[id number] DOB: 1945-11-06 DOA: 10/14/2017  Referring MD/NP/PA: Lorre Munroe, PA-C PCP: Hoyt Koch, MD  Patient coming from: Home  Chief Complaint: Constipation on abdominal pain  I have personally briefly reviewed patient's old medical records in [place]   HPI: Lisa Hamilton is a 72 y.o. female with medical history significant of COPD, HTN, nephrolithiasis, umbilical hernia, and benign hepatic cyst; who presents with complaints of worsening lower abdominal pain with constipation over the last 5 days.  She reports having severe pain with significant protrusion of her umbilical hernia that is been present for many years.  Tried taking over-the-counter stool softeners without relief of symptoms.  Associated symptoms include poor p.o. intake, nausea, vomiting, shortness of breath, left lower extremity swelling(she reports is chronic, but acutely worse).  Denies having any significant fevers, chills, dysuria, recent sick contacts.  Patient last notes having abdominal surgery to evaluate hepatic cyst done by Dr. Barry Dienes back in 2012 that were noted to be benign.   ED Course: Upon admission into the emergency department patient was noted to be afebrile, pulse 81-117, respirations up to 28, and all other vital signs maintained.  Labs revealed WBC 12.6, potassium 3.3, glucose 143, lactic acid 1.6, and all labs relatively within normal limits.  CT scan of the abdomen revealed signs of a partial small bowel obstruction with nonobstructing renal stones bilaterally, and 16.4 cm hepatic cyst.  On physical exam patient noted to have a ventral hernia which was reduced ED provider.  Dr. Donne Hazel of General surgery was consulted and recommended placement of NG tube.  Patient received 50 mcg of fentanyl and 4 mg of Zofran while in the ED.   [facility] called to admit.  Review of Systems  Constitutional: Negative for chills and fever.  HENT: Negative for ear  discharge and nosebleeds.   Eyes: Negative for pain and discharge.  Respiratory: Positive for shortness of breath.   Cardiovascular: Positive for leg swelling. Negative for chest pain.  Gastrointestinal: Positive for abdominal pain, constipation, nausea and vomiting.  Genitourinary: Negative for dysuria and hematuria.  Musculoskeletal: Negative for falls and neck pain.  Neurological: Negative for focal weakness and loss of consciousness.  Psychiatric/Behavioral: Negative for substance abuse and suicidal ideas.    Past Medical History:  Diagnosis Date  . Allergy   . Asthmatic bronchitis   . Cancer (HCC)    skin cancer- squamous cell- scalp, right wrist  . Constipation    chronic  . COPD (chronic obstructive pulmonary disease) ([place])   . Diverticulosis of colon   . DJD (degenerative joint disease)   . Glaucoma 1977   EYE SURGERY  FOR TX OF GLAUCOMA--NO LONGER HAS GLAUCOMA  . Hepatic cyst    s/p surgical intervention 05/2011 -[place]  . Hiatal hernia   . Hx of colonic polyps   . Hypertension   . Kidney stone   . Lipoma   . Medullary sponge kidney   . Nontoxic multinodular goiter    pt unsure of this  . Obesity   . Osteoporosis   . Renal calculus    s/p lithotripsy 06/2011  . Restless leg syndrome   . Shingles 2009  . Venous insufficiency   . Vitamin D deficiency     Past Surgical History:  Procedure Laterality Date  . bilateral carpel tunnel repairs     Dr. Shellia Carwin  left--1999   right--2008  . bilateral wrist fracture, right  1996  left 2001, Dr. Daylene Katayama  . CATARACT EXTRACTION  04/07/13 R, 05/18/13 L   [facility]  . COLONOSCOPY    . eye surgery Left 11/22/2014   "MD pucker" repair  . [place]  . [place]  . HERNIA REPAIR  2012   umb hernia  . LAPAROSCOPIC LIVER CYST FENESTRATION  05/2011  . LIPOMA EXCISION  08/13/2012   Procedure: EXCISION LIPOMA;  Surgeon: Magnus Sinning, MD;  Location: [facility];  Service: Orthopedics;  Laterality: Left;    . SHOULDER OPEN ROTATOR CUFF REPAIR  08/13/2012   Procedure: ROTATOR CUFF REPAIR SHOULDER OPEN;  Surgeon: Magnus Sinning, MD;  Location: [facility];  Service: Orthopedics;  Laterality: Left;  LEFT SHOULDER ANTERIOR ACROMINECTOMY AND ROTATOR CUFF REPAIR AND EXCISION LEFT SHOULDER LIPOMA  . SQUAMOUS CELL CARCINOMA EXCISION  2010   L [place]  . TONSILLECTOMY  1967     reports that  has never smoked. she has never used smokeless tobacco. She reports that she drinks alcohol. She reports that she does not use drugs.  Allergies  Allergen Reactions  . Adhesive [Tape] Other (See Comments)    blisters  . Codeine Other (See Comments)    nightmares  . Amoxicillin-Pot Clavulanate Rash  . Penicillins Rash    Family History  Problem Relation Age of Onset  . Lung cancer Father   . Colon cancer Father        possible colon cancer, but unsure  . Lung cancer Sister   . Irritable bowel syndrome Sister   . Obstructive Sleep Apnea Sister   . Osteoarthritis Sister   . Esophageal cancer Neg Hx   . Rectal cancer Neg Hx   . Stomach cancer Neg Hx     Prior to Admission medications   Medication Sig Start Date End Date Taking? Authorizing Provider  albuterol (PROVENTIL) (2.5 MG/3ML) 0.083% nebulizer solution USE 1 VIAL IN NEBULIZER 4 TIMES DAILY. Generic: VENTOLIN 10/13/17  Yes Young, Tarri Fuller D, MD  aspirin 81 MG tablet Take 162 mg by mouth at bedtime.    Yes [provider]  Calcium Carbonate-Vitamin D 600-400 MG-UNIT per tablet Take 1 tablet by mouth 3 (three) times daily.   Yes [provider]  Cholecalciferol (VITAMIN D) 1000 UNITS capsule Take 2,000 Units by mouth daily. VITAMIN D 3   Yes [provider]  clonazePAM (KLONOPIN) 0.5 MG tablet 1-3 taken 30 minutes before bed Patient taking differently: Take 0.5 mg by mouth See admin instructions. Take 1 tablet every evening and take 1/2 tablet at night. May take 1/2 tablet extra if needed 08/18/17  Yes Young, Tarri Fuller D, MD   Co-Enzyme Q-10 100 MG CAPS Take 200 mg by mouth daily.    Yes [provider]  furosemide (LASIX) 40 MG tablet Take 1 tablet (40 mg total) by mouth 3 (three) times daily. 07/08/17  Yes Hoyt Koch, MD  gabapentin (NEURONTIN) 300 MG capsule TAKE 1 CAPSULE BY MOUTH 3  TIMES DAILY 09/10/17  Yes Young, Tarri Fuller D, MD  guaiFENesin (MUCINEX) 600 MG 12 hr tablet Take 600 mg by mouth 2 (two) times daily as needed. Reported on 12/22/2015   Yes [provider]  Astrid Drafts Ultra Strength 1500 MG CAPS Take 1,500 mg by mouth daily.   Yes [provider]  losartan (COZAAR) 100 MG tablet Take 1 tablet (100 mg total) by mouth daily. 07/08/17  Yes Hoyt Koch, MD  Magnesium 400 MG CAPS Take 400 mg  by mouth 2 (two) times daily.    Yes [provider]  meloxicam (MOBIC) 15 MG tablet Take 15 mg by mouth daily. 09/18/17  Yes [provider]  montelukast (SINGULAIR) 10 MG tablet Take 1 tablet (10 mg total) by mouth at bedtime. 07/08/17  Yes Hoyt Koch, MD  Multiple Vitamin (MULTIVITAMIN) capsule Take 1 capsule by mouth daily. Iron free   Yes [provider]  potassium chloride SA (K-DUR,KLOR-CON) 20 MEQ tablet Take 2 tablets (40 mEq total) by mouth daily. 07/11/17  Yes Hoyt Koch, MD  pravastatin (PRAVACHOL) 40 MG tablet Take 1 tablet (40 mg total) by mouth daily. 07/08/17  Yes Hoyt Koch, MD  rOPINIRole (REQUIP) 0.5 MG tablet Up to 6 daily as needed Patient taking differently: Take 0.5 mg by mouth See admin instructions. Take 1 tablet every morning, in the afternoon, in the evening and take 2 tablets at bedtime. May take 1 tablet extra if needed 08/18/17  Yes Young, Tarri Fuller D, MD  traMADol (ULTRAM) 50 MG tablet Take 1 tablet (50 mg total) by mouth daily as needed. 07/08/17  Yes Hoyt Koch, MD  triamcinolone cream (KENALOG) 0.1 % Apply 1 application topically 2 (two) times daily. Patient taking differently: Apply 1  application topically 2 (two) times daily as needed (for rash).  06/25/16  Yes Hoyt Koch, MD  oxyCODONE-acetaminophen (ROXICET) 5-325 MG tablet Take 1 tablet by mouth every 8 (eight) hours as needed for severe pain. Patient not taking: Reported on 10/14/2017 08/27/17   Edrick Kins, DPM  theophylline (THEODUR) 300 MG 12 hr tablet Take 1 tablet by mouth two  times daily Patient not taking: Reported on 10/14/2017 10/26/15   Hoyt Koch, MD    Physical Exam:  Constitutional: Elderly female who appears to be in some moderate distress.   Vitals:   10/14/17 2237 10/14/17 2238 10/15/17 0000  BP: 129/71  (!) 147/65  Pulse: (!) 117  81  Resp:   (!) 28  Temp: 98.9 F (37.2 C)    TempSrc: Oral    SpO2: 96%  96%  Weight:  90.7 kg (200 lb)   Height:  5' (1.524 m)    Eyes: PERRL, lids and conjunctivae normal ENMT: Mucous membranes are dry. Posterior pharynx clear of any exudate or lesions.  Neck: normal, supple, no masses, no thyromegaly Respiratory: clear to auscultation bilaterally, no wheezing, no crackles. Normal respiratory effort. No accessory muscle use.  Cardiovascular: Regular rate and rhythm, no murmurs / rubs / gallops.  2+ edema of the left lower extremity twice the size of right lower extremity. 2+ pedal pulses. No carotid bruits.  Abdomen: Protuberant abdomen with periumbilical tenderness present with ventral hernia now reduced, no masses palpated.  Decreased bowel sounds. Musculoskeletal: no clubbing / cyanosis. No joint deformity upper and lower extremities. Good ROM, no contractures. Normal muscle tone.  Skin: no rashes, lesions, ulcers. No induration Neurologic: CN 2-12 grossly intact. Sensation intact, DTR normal. Strength 5/5 in all 4.  Psychiatric: Normal judgment and insight. Alert and oriented x 3. Normal mood.     Labs on Admission: I have personally reviewed following labs and imaging studies  CBC: Recent Labs  Lab 10/14/17 2313  WBC 12.6*    NEUTROABS 10.1*  HGB 14.7  HCT 45.7  MCV 95.0  PLT 782   Basic Metabolic Panel: Recent Labs  Lab 10/14/17 2313  NA 140  K 3.3*  CL 94*  CO2 31  GLUCOSE 143*  BUN 12  CREATININE 0.76  CALCIUM 9.8   GFR: Estimated Creatinine Clearance: 65.7 mL/min (by C-G formula based on SCr of 0.76 mg/dL). Liver Function Tests: Recent Labs  Lab 10/14/17 2313  AST 25  ALT 14  ALKPHOS 122  BILITOT 1.2  PROT 7.2  ALBUMIN 3.8   No results for input(s): LIPASE, AMYLASE in the last 168 hours. No results for input(s): AMMONIA in the last 168 hours. Coagulation Profile: No results for input(s): INR, PROTIME in the last 168 hours. Cardiac Enzymes: No results for input(s): CKTOTAL, CKMB, CKMBINDEX, TROPONINI in the last 168 hours. BNP (last 3 results) No results for input(s): PROBNP in the last 8760 hours. HbA1C: No results for input(s): HGBA1C in the last 72 hours. CBG: No results for input(s): GLUCAP in the last 168 hours. Lipid Profile: No results for input(s): CHOL, HDL, LDLCALC, TRIG, CHOLHDL, LDLDIRECT in the last 72 hours. Thyroid Function Tests: No results for input(s): TSH, T4TOTAL, FREET4, T3FREE, THYROIDAB in the last 72 hours. Anemia Panel: No results for input(s): VITAMINB12, FOLATE, FERRITIN, TIBC, IRON, RETICCTPCT in the last 72 hours. Urine analysis:    Component Value Date/Time   COLORURINE YELLOW 05/20/2011 0942   APPEARANCEUR CLEAR 05/20/2011 0942   LABSPEC 1.015 05/20/2011 0942   PHURINE 7.5 05/20/2011 0942   GLUCOSEU NEGATIVE 05/20/2011 0942   HGBUR NEGATIVE 05/20/2011 0942   BILIRUBINUR NEGATIVE 05/20/2011 0942   KETONESUR NEGATIVE 05/20/2011 0942   PROTEINUR NEGATIVE 05/20/2011 0942   UROBILINOGEN 0.2 05/20/2011 0942   NITRITE NEGATIVE 05/20/2011 0942   LEUKOCYTESUR SMALL (A) 05/20/2011 0942   Sepsis Labs: No results found for this or any previous visit (from the past 240 hour(s)).   Radiological Exams on Admission: Ct Abdomen Pelvis W  Contrast  Result Date: 10/15/2017 CLINICAL DATA:  Acute onset of nausea and vomiting. EXAM: CT ABDOMEN AND PELVIS WITH CONTRAST TECHNIQUE: Multidetector CT imaging of the abdomen and pelvis was performed using the standard protocol following bolus administration of intravenous contrast. CONTRAST:  186mL ISOVUE-300 IOPAMIDOL (ISOVUE-300) INJECTION 61% COMPARISON:  CT of the abdomen and pelvis from 07/14/2015 FINDINGS: Lower chest: Mild bibasilar atelectasis is noted. The visualized portions of the mediastinum are unremarkable. Fluid is noted filling the distal esophagus, likely reflecting gastroesophageal reflux. Hepatobiliary: Multiple cysts are again noted within the liver, the largest of which is stable in size, measuring approximately 16.4 cm, with minimal peripheral calcification. Trace fluid is noted tracking about the liver. The gallbladder is decompressed and grossly unremarkable. The common bile duct is grossly unremarkable in appearance. Pancreas: The pancreas is within normal limits. Spleen: The spleen is unremarkable in appearance. Trace ascites is noted tracking about the spleen. Adrenals/Urinary Tract: The adrenal glands are unremarkable in appearance. There is incomplete rotation of the right kidney. Nonobstructing bilateral renal stones measure up to 8 mm in size. There is no evidence of hydronephrosis. No obstructing ureteral stones are identified. Nonspecific perinephric stranding is noted bilaterally. Stomach/Bowel: There is dilatation of small-bowel loops to 5.5 cm in diameter, with gradual decompression of the mid to distal ileum at the lower abdomen posterior to the patient's small periumbilical hernia. Trace air is noted within the more distal small bowel loops. This may reflect partial small-bowel obstruction secondary to an adhesion. Mild surrounding free fluid is noted, tracking into the pelvis and along the left paracolic gutter. The stomach is largely filled with fluid. Mild  diverticulosis is noted along the descending colon, without evidence of diverticulitis. The appendix is grossly unremarkable in appearance.  Vascular/Lymphatic: The abdominal aorta is unremarkable in appearance. The inferior vena cava is grossly unremarkable. No retroperitoneal lymphadenopathy is seen. No pelvic sidewall lymphadenopathy is identified. Reproductive: The bladder is mildly distended and within normal limits. The uterus is grossly unremarkable in appearance. The ovaries are relatively symmetric. No suspicious adnexal masses are seen. Other: Diffuse soft tissue edema is seen along the pannus. Musculoskeletal: No acute osseous abnormalities are identified. Multilevel vacuum phenomenon is noted along the lumbar spine. There is grade 1 anterolisthesis of L4 on L5, reflecting underlying facet disease. The visualized musculature is unremarkable in appearance. IMPRESSION: 1. Diffuse dilatation of small-bowel loops to 5.5 cm in diameter, with gradual decompression of the mid to distal ileum at the lower abdomen posterior to the small periumbilical hernia. Trace air noted within more distal small bowel loops. This may reflect partial small-bowel obstruction secondary to adhesion. 2. Small amount of ascites noted within the abdomen and pelvis, likely reflecting the small bowel process. 3. Diffuse soft tissue edema noted along the pannus. 4. Fluid filling the distal esophagus, likely reflecting gastroesophageal reflux. 5. Multiple large hepatic cysts again noted, measuring up to 16.4 cm. 6. Nonobstructing bilateral renal stones measure up to 8 mm in size. 7. Mild diverticulosis along the descending colon, without evidence of diverticulitis. 8. Mild degenerative change along the lumbar spine. Electronically Signed   By: Garald Balding M.D.   On: 10/15/2017 01:26    EKG: Independently reviewed.  Normal sinus rhythm with signs of background artifact and 99 bpm  Assessment/Plan Partial small bowel obstruction,  umbilical hernia: Acute.  Patient presents with complaints of constipation for the last 5 days.  CT scan showing dilated loops of bowel with signs of partial bowel obstruction.  General surgery consulted and NG tube placement. - Admit to a MedSurg bed - Monitor intake and output - NPO - Continue NGT - IV fluids normal saline at 75 mils - Fentanyl IV as needed pain - Antiemetics as needed - Appreciate general surgery consultative services, will follow-up for further workup recommendation  Hypokalemia: Acute.  Initial potassium 3.3 on admission. - Give 30 mEq of potassium chloride IV - Continue to monitor and replace as needed  Asthma/COPD: Without acute exacerbation - Continuous pulse oximetry with nasal cannula oxygen as needed - Albuterol nebs  Incidental findings on CT: Patient noted to have bilateral nonobstructing  renal stones measuring up to 8 mm and 16.4 cm hepatic cyst.    DVT prophylaxis:lovenox Code Status: full  Family Communication: Discussed plan of care with the patient and her husband present at bedside Disposition Plan: Likely discharge home in 1-3 days Consults called: General surgery Admission status: observation  Norval Morton MD [facility] Pager [phone]   If 7PM-7AM, please contact night-coverage [email or web address] Password [email or web address]  10/15/2017, 2:01 AM

## 2017-10-15 NOTE — Anesthesia Preprocedure Evaluation (Signed)
Anesthesia Evaluation  Patient identified by MRN, date of birth, ID band Patient awake    Reviewed: Allergy & Precautions, NPO status , Patient's Chart, lab work & pertinent test results  Airway Mallampati: II  TM Distance: <3 FB Neck ROM: Full    Dental no notable dental hx.    Pulmonary COPD,    Pulmonary exam normal breath sounds clear to auscultation       Cardiovascular hypertension, Normal cardiovascular exam Rhythm:Regular Rate:Normal     Neuro/Psych negative neurological ROS  negative psych ROS   GI/Hepatic negative GI ROS, Neg liver ROS,   Endo/Other  Morbid obesity  Renal/GU negative Renal ROS  negative genitourinary   Musculoskeletal negative musculoskeletal ROS (+)   Abdominal   Peds negative pediatric ROS (+)  Hematology negative hematology ROS (+)   Anesthesia Other Findings   Reproductive/Obstetrics negative OB ROS                             Anesthesia Physical Anesthesia Plan  ASA: III  Anesthesia Plan: General   Post-op Pain Management:    Induction: Intravenous and Rapid sequence  PONV Risk Score and Plan: 3 and Ondansetron, Dexamethasone and Treatment may vary due to age or medical condition  Airway Management Planned: Oral ETT  Additional Equipment:   Intra-op Plan:   Post-operative Plan: Extubation in OR  Informed Consent: I have reviewed the patients History and Physical, chart, labs and discussed the procedure including the risks, benefits and alternatives for the proposed anesthesia with the patient or authorized representative who has indicated his/her understanding and acceptance.   Dental advisory given  Plan Discussed with: CRNA and Surgeon  Anesthesia Plan Comments:         Anesthesia Quick Evaluation

## 2017-10-15 NOTE — Progress Notes (Signed)
Patient admitted earlier this morning.  H&P reviewed.  Patient seen and examined.  S: Patient states that she is feeling better.  Denies any abdominal pain.  She is waiting on surgery.  O: Vital signs are stable.  Lungs are clear to auscultation bilaterally S1-S2 is normal regular. Abdomen is soft.  Mildly tender around the umbilical area without any rebound rigidity or guarding.  No masses organomegaly.  Potassium was 2.9 this morning  A/P: Patient admitted with a partial small bowel obstruction due to incarceration within umbilical hernia.  General surgery plans to operate today.  She has improved with NG tube placement.  Potassium to be repleted.  Magnesium was normal.   Other issues as addressed in the H&P.  Bonnielee Haff 10/15/2017

## 2017-10-15 NOTE — Consult Note (Signed)
Reason for Consult:sbo/abd pain Referring Physician: Dr Christin Fudge is an 72 y.o. female.  HPI: 21 yof who has history in 2012 with lap fenestration of large symptomatic hepatic cyst.  She was noted to have uh at that time that was fixed primarily by her report. This was here with Dr Barry Dienes.  This recurred at some point after that. Recently she noted that the hernia was tender and she was not having flatus or stool. Last stool was Friday. Having n/v at home. No fevers. Nothing making it better. Came to er and was noted to have uh that was reduced by Dr Venora Maples. She states the hernia was tender before that.  She then had ct that shows she likely had sbo related to hernia given location. I was asked to see her.  She now has ng and reduced hernia and feels much better.  Ng has large volume output, she has passed some flatus now also  Past Medical History:  Diagnosis Date  . Allergy   . Asthmatic bronchitis   . Cancer (HCC)    skin cancer- squamous cell- scalp, right wrist  . Constipation    chronic  . COPD (chronic obstructive pulmonary disease) ([place])   . Diverticulosis of colon   . DJD (degenerative joint disease)   . Glaucoma 1977   EYE SURGERY  FOR TX OF GLAUCOMA--NO LONGER HAS GLAUCOMA  . Hepatic cyst    s/p surgical intervention 05/2011 -[place]  . Hiatal hernia   . Hx of colonic polyps   . Hypertension   . Kidney stone   . Lipoma   . Medullary sponge kidney   . Nontoxic multinodular goiter    pt unsure of this  . Obesity   . Osteoporosis   . Renal calculus    s/p lithotripsy 06/2011  . Restless leg syndrome   . Shingles 2009  . Venous insufficiency   . Vitamin D deficiency     Past Surgical History:  Procedure Laterality Date  . bilateral carpel tunnel repairs     Dr. Shellia Carwin  left--1999   right--2008  . bilateral wrist fracture, right  1996   left 2001, Dr. Daylene Katayama  . CATARACT EXTRACTION  04/07/13 R, 05/18/13 L   shapiro  . COLONOSCOPY    . eye surgery Left  11/22/2014   "MD pucker" repair  . [place]  . [place]  . HERNIA REPAIR  2012   umb hernia  . LAPAROSCOPIC LIVER CYST FENESTRATION  05/2011  . LIPOMA EXCISION  08/13/2012   Procedure: EXCISION LIPOMA;  Surgeon: Magnus Sinning, MD;  Location: [facility];  Service: Orthopedics;  Laterality: Left;  . SHOULDER OPEN ROTATOR CUFF REPAIR  08/13/2012   Procedure: ROTATOR CUFF REPAIR SHOULDER OPEN;  Surgeon: Magnus Sinning, MD;  Location: [facility];  Service: Orthopedics;  Laterality: Left;  LEFT SHOULDER ANTERIOR ACROMINECTOMY AND ROTATOR CUFF REPAIR AND EXCISION LEFT SHOULDER LIPOMA  . SQUAMOUS CELL CARCINOMA EXCISION  2010   L [place]  . TONSILLECTOMY  1967    Family History  Problem Relation Age of Onset  . Lung cancer Father   . Colon cancer Father        possible colon cancer, but unsure  . Lung cancer Sister   . Irritable bowel syndrome Sister   . Obstructive Sleep Apnea Sister   . Osteoarthritis Sister   . Esophageal cancer Neg Hx   . Rectal cancer Neg Hx   .  Stomach cancer Neg Hx     Social History:  reports that  has never smoked. she has never used smokeless tobacco. She reports that she drinks alcohol. She reports that she does not use drugs.  Allergies:  Allergies  Allergen Reactions  . Adhesive [Tape] Other (See Comments)    blisters  . Codeine Other (See Comments)    nightmares  . Amoxicillin-Pot Clavulanate Rash  . Penicillins Rash    Medications: I have reviewed the patient's current medications.  Results for orders placed or performed during the hospital encounter of 10/14/17 (from the past 48 hour(s))  CBC with Differential/Platelet     Status: Abnormal   Collection Time: 10/14/17 11:13 PM  Result Value Ref Range   WBC 12.6 (H) 4.0 - 10.5 K/uL   RBC 4.81 3.87 - 5.11 MIL/uL   Hemoglobin 14.7 12.0 - 15.0 g/dL   HCT 45.7 36.0 - 46.0 %   MCV 95.0 78.0 - 100.0 fL   MCH 30.6 26.0 - 34.0 pg   MCHC 32.2 30.0 - 36.0 g/dL   RDW 14.7 11.5 -  15.5 %   Platelets 315 150 - 400 K/uL   Neutrophils Relative % 80 %   Neutro Abs 10.1 (H) 1.7 - 7.7 K/uL   Lymphocytes Relative 10 %   Lymphs Abs 1.3 0.7 - 4.0 K/uL   Monocytes Relative 10 %   Monocytes Absolute 1.2 (H) 0.1 - 1.0 K/uL   Eosinophils Relative 0 %   Eosinophils Absolute 0.0 0.0 - 0.7 K/uL   Basophils Relative 0 %   Basophils Absolute 0.0 0.0 - 0.1 K/uL    Comment: Performed at [facility], [address] [place], [place] [address]  Comprehensive metabolic panel     Status: Abnormal   Collection Time: 10/14/17 11:13 PM  Result Value Ref Range   Sodium 140 135 - 145 mmol/L   Potassium 3.3 (L) 3.5 - 5.1 mmol/L   Chloride 94 (L) 101 - 111 mmol/L   CO2 31 22 - 32 mmol/L   Glucose, Bld 143 (H) 65 - 99 mg/dL   BUN 12 6 - 20 mg/dL   Creatinine, Ser 0.76 0.44 - 1.00 mg/dL   Calcium 9.8 8.9 - 10.3 mg/dL   Total Protein 7.2 6.5 - 8.1 g/dL   Albumin 3.8 3.5 - 5.0 g/dL   AST 25 15 - 41 U/L   ALT 14 14 - 54 U/L   Alkaline Phosphatase 122 38 - 126 U/L   Total Bilirubin 1.2 0.3 - 1.2 mg/dL   GFR calc non Af Amer >60 >60 mL/min   GFR calc Af Amer >60 >60 mL/min    Comment: (NOTE) The eGFR has been calculated using the CKD EPI equation. This calculation has not been validated in all clinical situations. eGFR's persistently <60 mL/min signify possible Chronic Kidney Disease.    Anion gap 15 5 - 15    Comment: Performed at [facility], [address] [place], [place] [address]  I-Stat CG4 Lactic Acid, ED     Status: None   Collection Time: 10/14/17 11:19 PM  Result Value Ref Range   Lactic Acid, Venous 1.60 0.5 - 1.9 mmol/L  CBC     Status: Abnormal   Collection Time: 10/15/17  4:16 AM  Result Value Ref Range   WBC 11.7 (H) 4.0 - 10.5 K/uL   RBC 4.67 3.87 - 5.11 MIL/uL   Hemoglobin 14.1 12.0 - 15.0 g/dL   HCT 45.4 36.0 -   46.0 %   MCV 97.2 78.0 - 100.0 fL   MCH 30.2 26.0 - 34.0 pg   MCHC 31.1 30.0 - 36.0 g/dL   RDW 15.0 11.5 - 15.5 %   Platelets 285 150  - 400 K/uL    Comment: Performed at [facility], [address] [place], [place] [address]  Basic metabolic panel     Status: Abnormal   Collection Time: 10/15/17  4:16 AM  Result Value Ref Range   Sodium 143 135 - 145 mmol/L   Potassium 2.9 (L) 3.5 - 5.1 mmol/L   Chloride 95 (L) 101 - 111 mmol/L   CO2 35 (H) 22 - 32 mmol/L   Glucose, Bld 134 (H) 65 - 99 mg/dL   BUN 11 6 - 20 mg/dL   Creatinine, Ser 0.70 0.44 - 1.00 mg/dL   Calcium 9.6 8.9 - 10.3 mg/dL   GFR calc non Af Amer >60 >60 mL/min   GFR calc Af Amer >60 >60 mL/min    Comment: (NOTE) The eGFR has been calculated using the CKD EPI equation. This calculation has not been validated in all clinical situations. eGFR's persistently <60 mL/min signify possible Chronic Kidney Disease.    Anion gap 13 5 - 15    Comment: Performed at [facility], [address] [place], [place] [address]  I-Stat CG4 Lactic Acid, ED     Status: None   Collection Time: 10/15/17  4:31 AM  Result Value Ref Range   Lactic Acid, Venous 1.04 0.5 - 1.9 mmol/L    Ct Abdomen Pelvis W Contrast  Result Date: 10/15/2017 CLINICAL DATA:  Acute onset of nausea and vomiting. EXAM: CT ABDOMEN AND PELVIS WITH CONTRAST TECHNIQUE: Multidetector CT imaging of the abdomen and pelvis was performed using the standard protocol following bolus administration of intravenous contrast. CONTRAST:  100mL ISOVUE-300 IOPAMIDOL (ISOVUE-300) INJECTION 61% COMPARISON:  CT of the abdomen and pelvis from 07/14/2015 FINDINGS: Lower chest: Mild bibasilar atelectasis is noted. The visualized portions of the mediastinum are unremarkable. Fluid is noted filling the distal esophagus, likely reflecting gastroesophageal reflux. Hepatobiliary: Multiple cysts are again noted within the liver, the largest of which is stable in size, measuring approximately 16.4 cm, with minimal peripheral calcification. Trace fluid is noted tracking about the liver. The gallbladder is decompressed and  grossly unremarkable. The common bile duct is grossly unremarkable in appearance. Pancreas: The pancreas is within normal limits. Spleen: The spleen is unremarkable in appearance. Trace ascites is noted tracking about the spleen. Adrenals/Urinary Tract: The adrenal glands are unremarkable in appearance. There is incomplete rotation of the right kidney. Nonobstructing bilateral renal stones measure up to 8 mm in size. There is no evidence of hydronephrosis. No obstructing ureteral stones are identified. Nonspecific perinephric stranding is noted bilaterally. Stomach/Bowel: There is dilatation of small-bowel loops to 5.5 cm in diameter, with gradual decompression of the mid to distal ileum at the lower abdomen posterior to the patient's small periumbilical hernia. Trace air is noted within the more distal small bowel loops. This may reflect partial small-bowel obstruction secondary to an adhesion. Mild surrounding free fluid is noted, tracking into the pelvis and along the left paracolic gutter. The stomach is largely filled with fluid. Mild diverticulosis is noted along the descending colon, without evidence of diverticulitis. The appendix is grossly unremarkable in appearance. Vascular/Lymphatic: The abdominal aorta is unremarkable in appearance. The inferior vena cava is grossly unremarkable. No retroperitoneal lymphadenopathy is seen. No pelvic sidewall lymphadenopathy is identified.   Reproductive: The bladder is mildly distended and within normal limits. The uterus is grossly unremarkable in appearance. The ovaries are relatively symmetric. No suspicious adnexal masses are seen. Other: Diffuse soft tissue edema is seen along the pannus. Musculoskeletal: No acute osseous abnormalities are identified. Multilevel vacuum phenomenon is noted along the lumbar spine. There is grade 1 anterolisthesis of L4 on L5, reflecting underlying facet disease. The visualized musculature is unremarkable in appearance. IMPRESSION: 1.  Diffuse dilatation of small-bowel loops to 5.5 cm in diameter, with gradual decompression of the mid to distal ileum at the lower abdomen posterior to the small periumbilical hernia. Trace air noted within more distal small bowel loops. This may reflect partial small-bowel obstruction secondary to adhesion. 2. Small amount of ascites noted within the abdomen and pelvis, likely reflecting the small bowel process. 3. Diffuse soft tissue edema noted along the pannus. 4. Fluid filling the distal esophagus, likely reflecting gastroesophageal reflux. 5. Multiple large hepatic cysts again noted, measuring up to 16.4 cm. 6. Nonobstructing bilateral renal stones measure up to 8 mm in size. 7. Mild diverticulosis along the descending colon, without evidence of diverticulitis. 8. Mild degenerative change along the lumbar spine. Electronically Signed   By: Jeffery  Chang M.D.   On: 10/15/2017 01:26   Dg Abd Portable 1 View  Result Date: 10/15/2017 CLINICAL DATA:  71-year-old female status post enteric tube placement. EXAM: PORTABLE ABDOMEN - 1 VIEW COMPARISON:  Abdominal CT dated 10/15/2017 FINDINGS: An enteric tube partially visualized extending down over the spine with side-port over the upper lumbar spine and tip in the mid abdomen at the level of the L3-L4 disc, likely in the distal stomach. Dilated loops of small bowel noted in the mid abdomen measuring up to 5.5 cm. IMPRESSION: Enteric tube with tip likely in the distal stomach. There is persistent dilatation of small-bowel loops. Electronically Signed   By: Arash  Radparvar M.D.   On: 10/15/2017 03:15    Review of Systems  Respiratory: Positive for wheezing.   Gastrointestinal: Positive for abdominal pain, constipation, nausea and vomiting.  All other systems reviewed and are negative.  Blood pressure (!) 117/58, pulse 89, temperature 98.9 F (37.2 C), temperature source Oral, resp. rate (!) 24, height 5' (1.524 m), weight 90.7 kg (200 lb), SpO2 95  %. Physical Exam  Vitals reviewed. Constitutional: She is oriented to person, place, and time. She appears well-developed and well-nourished.  HENT:  Head: Normocephalic and atraumatic.  Right Ear: External ear normal.  Left Ear: External ear normal.  Eyes: Pupils are equal, round, and reactive to light. No scleral icterus.  Neck: Neck supple.  Cardiovascular: Normal rate, regular rhythm, normal heart sounds and intact distal pulses.  Bilateral 2+ le edema  Respiratory: Effort normal and breath sounds normal. She has no wheezes.  GI: She exhibits no distension. Bowel sounds are decreased. There is no tenderness. A hernia (reducible nontender uh now) is present. Hernia confirmed positive in the ventral area.    Lymphadenopathy:    She has no cervical adenopathy.  Neurological: She is alert and oriented to person, place, and time.  Skin: Skin is warm and dry. She is not diaphoretic.  Psychiatric: She has a normal mood and affect.    Assessment/Plan: SBO likely related to incarcerated [facility]  sbo appears already resolving. No indication for urgent surgery as exam now is benign, continue ng tube repeat xray today.  I think she will need uh repaired at this point only question is timing. Ideally would   be laparoscopically with mesh.  We will follow    10/15/2017, 6:00 AM     

## 2017-10-15 NOTE — Progress Notes (Signed)
1255 Received pt from PACU. Patient alert and oriented x4. Umbilical dressing clean dry and intact. Patient resting in bed. Will continue to monitor.

## 2017-10-15 NOTE — ED Notes (Signed)
Patient transported to CT 

## 2017-10-15 NOTE — Anesthesia Postprocedure Evaluation (Signed)
Anesthesia Post Note  Patient: ADALAYA IRION  Procedure(s) Performed: HERNIA REPAIR VENTRAL ADULT (N/A ) INSERTION OF MESH (N/A )     Patient location during evaluation: PACU Anesthesia Type: General Level of consciousness: awake and alert Pain management: pain level controlled Vital Signs Assessment: post-procedure vital signs reviewed and stable Respiratory status: spontaneous breathing, nonlabored ventilation, respiratory function stable and patient connected to nasal cannula oxygen Cardiovascular status: blood pressure returned to baseline and stable Postop Assessment: no apparent nausea or vomiting Anesthetic complications: no    Last Vitals:  Vitals:   10/15/17 1237 10/15/17 1255  BP:  (!) 117/58  Pulse: 89 88  Resp: 18 16  Temp: (!) 36.1 C 36.8 C  SpO2: 93% 91%    Last Pain:  Vitals:   10/15/17 1255  TempSrc: Oral  PainSc:                  Demonta Wombles S

## 2017-10-15 NOTE — Discharge Instructions (Signed)
[facility] _______[facility] ° °UMBILICAL OR INGUINAL HERNIA REPAIR: POST OP INSTRUCTIONS ° °Always review your discharge instruction sheet given to you by the facility where your surgery was performed. °IF YOU HAVE DISABILITY OR FAMILY LEAVE FORMS, YOU MUST BRING THEM TO THE OFFICE FOR PROCESSING.   °DO NOT GIVE THEM TO YOUR DOCTOR. ° °1. A  prescription for pain medication may be given to you upon discharge.  Take your pain medication as prescribed, if needed.  If narcotic pain medicine is not needed, then you may take acetaminophen (Tylenol) or ibuprofen (Advil) as needed. °2. Take your usually prescribed medications unless otherwise directed. °If you need a refill on your pain medication, please contact your pharmacy.  They will contact our office to request authorization. Prescriptions will not be filled after 5 pm or on week-ends. °3. You should follow a light diet the first 24 hours after arrival home, such as soup and crackers, etc.  Be sure to include lots of fluids daily.  Resume your normal diet the day after surgery. °4.Most patients will experience some swelling and bruising around the umbilicus or in the groin and scrotum.  Ice packs and reclining will help.  Swelling and bruising can take several days to resolve.  °6. It is common to experience some constipation if taking pain medication after surgery.  Increasing fluid intake and taking a stool softener (such as Colace) will usually help or prevent this problem from occurring.  A mild laxative (Milk of Magnesia or Miralax) should be taken according to package directions if there are no bowel movements after 48 hours. °7. Unless discharge instructions indicate otherwise, you may remove your bandages 24-48 hours after surgery, and you may shower at that time.  You may have steri-strips (small skin tapes) in place directly over the incision.  These strips should be left on the skin for 7-10 days.  If your surgeon used skin glue on the  incision, you may shower in 24 hours.  The glue will flake off over the next 2-3 weeks.  Any sutures or staples will be removed at the office during your follow-up visit. °8. ACTIVITIES:  You may resume regular (light) daily activities beginning the next day--such as daily self-care, walking, climbing stairs--gradually increasing activities as tolerated.  You may have sexual intercourse when it is comfortable.  Refrain from any heavy lifting or straining until approved by your doctor. ° °a.You may drive when you are no longer taking prescription pain medication, you can comfortably wear a seatbelt, and you can safely maneuver your car and apply brakes. °b.RETURN TO WORK:   °_____________________________________________ ° °9.You should see your doctor in the office for a follow-up appointment approximately 2-3 weeks after your surgery.  Make sure that you call for this appointment within a day or two after you arrive home to insure a convenient appointment time. °10.OTHER INSTRUCTIONS: _________________________ °   _____________________________________ ° °WHEN TO CALL YOUR DOCTOR: °1. Fever over 101.0 °2. Inability to urinate °3. Nausea and/or vomiting °4. Extreme swelling or bruising °5. Continued bleeding from incision. °6. Increased pain, redness, or drainage from the incision ° °The clinic staff is available to answer your questions during regular business hours.  Please don’t hesitate to call and ask to speak to one of the nurses for clinical concerns.  If you have a medical emergency, go to the nearest emergency room or call 911.  A surgeon from [facility] is always on call at the hospital ° ° °  [address], [address], [place], [place]  [address] ? ° [address] [address], , [place]   [address] °[phone] ? [phone] ? FAX [phone] °Web site: [email or web address] °

## 2017-10-15 NOTE — Transfer of Care (Signed)
Immediate Anesthesia Transfer of Care Note  Patient: Lisa Hamilton  Procedure(s) Performed: HERNIA REPAIR VENTRAL ADULT (N/A ) INSERTION OF MESH (N/A )  Patient Location: PACU  Anesthesia Type:General  Level of Consciousness: awake, alert , oriented and patient cooperative  Airway & Oxygen Therapy: Patient Spontanous Breathing, Patient connected to nasal cannula oxygen and Patient connected to face mask oxygen  Post-op Assessment: Report given to RN, Post -op Vital signs reviewed and stable, Patient moving all extremities and Patient moving all extremities X 4  Post vital signs: Reviewed and stable  Last Vitals:  Vitals:   10/15/17 0745 10/15/17 1156  BP: (!) 105/58   Pulse: 91   Resp:    Temp:  (P) 37.1 C  SpO2: 95%     Last Pain:  Vitals:   10/15/17 0613  TempSrc: Oral  PainSc: 0-No pain         Complications: No apparent anesthesia complications

## 2017-10-15 NOTE — Anesthesia Procedure Notes (Signed)
Procedure Name: Intubation Date/Time: 10/15/2017 10:11 AM Performed by: Izora Gala, CRNA Pre-anesthesia Checklist: Patient identified, Emergency Drugs available, Suction available and Patient being monitored Patient Re-evaluated:Patient Re-evaluated prior to induction Oxygen Delivery Method: Circle system utilized Preoxygenation: Pre-oxygenation with 100% oxygen Induction Type: IV induction Laryngoscope Size: Miller and 3 Grade View: Grade I Tube type: Oral Tube size: 7.0 mm Number of attempts: 1 Airway Equipment and Method: Stylet (Back up for RSI) Placement Confirmation: ETT inserted through vocal cords under direct vision,  positive ETCO2 and breath sounds checked- equal and bilateral Secured at: 21 cm Tube secured with: Tape Dental Injury: Teeth and Oropharynx as per pre-operative assessment

## 2017-10-16 ENCOUNTER — Inpatient Hospital Stay (HOSPITAL_COMMUNITY): Payer: Medicare Other

## 2017-10-16 ENCOUNTER — Encounter (HOSPITAL_COMMUNITY): Payer: Self-pay | Admitting: Surgery

## 2017-10-16 DIAGNOSIS — K56600 Partial intestinal obstruction, unspecified as to cause: Secondary | ICD-10-CM

## 2017-10-16 LAB — CBC
HCT: 42.8 % (ref 36.0–46.0)
Hemoglobin: 12.8 g/dL (ref 12.0–15.0)
MCH: 29.8 pg (ref 26.0–34.0)
MCHC: 29.9 g/dL — AB (ref 30.0–36.0)
MCV: 99.8 fL (ref 78.0–100.0)
PLATELETS: 250 10*3/uL (ref 150–400)
RBC: 4.29 MIL/uL (ref 3.87–5.11)
RDW: 15.2 % (ref 11.5–15.5)
WBC: 8.8 10*3/uL (ref 4.0–10.5)

## 2017-10-16 LAB — BASIC METABOLIC PANEL
ANION GAP: 12 (ref 5–15)
BUN: 13 mg/dL (ref 6–20)
CALCIUM: 8.5 mg/dL — AB (ref 8.9–10.3)
CO2: 32 mmol/L (ref 22–32)
CREATININE: 0.68 mg/dL (ref 0.44–1.00)
Chloride: 101 mmol/L (ref 101–111)
Glucose, Bld: 92 mg/dL (ref 65–99)
Potassium: 3.1 mmol/L — ABNORMAL LOW (ref 3.5–5.1)
Sodium: 145 mmol/L (ref 135–145)

## 2017-10-16 MED ORDER — FUROSEMIDE 40 MG PO TABS
40.0000 mg | ORAL_TABLET | Freq: Every day | ORAL | Status: DC
  Administered 2017-10-17: 40 mg via ORAL
  Filled 2017-10-16: qty 1

## 2017-10-16 MED ORDER — OXYCODONE HCL 5 MG PO TABS: 5.0000 mg | ORAL_TABLET | ORAL | Status: DC | PRN

## 2017-10-16 MED ORDER — GUAIFENESIN ER 600 MG PO TB12: 600.0000 mg | ORAL_TABLET | Freq: Two times a day (BID) | ORAL | Status: DC | PRN

## 2017-10-16 MED ORDER — ROPINIROLE HCL 1 MG PO TABS
1.0000 mg | ORAL_TABLET | Freq: Every day | ORAL | Status: DC
Start: 2017-10-16 — End: 2017-10-17
  Administered 2017-10-16: 1 mg via ORAL
  Filled 2017-10-16: qty 1

## 2017-10-16 MED ORDER — METHOCARBAMOL 500 MG PO TABS
500.0000 mg | ORAL_TABLET | Freq: Three times a day (TID) | ORAL | Status: DC
  Administered 2017-10-16 – 2017-10-17 (×4): 500 mg via ORAL
  Filled 2017-10-16 (×4): qty 1

## 2017-10-16 MED ORDER — OXYCODONE HCL 5 MG PO TABS: 5.0000 mg | ORAL_TABLET | Freq: Four times a day (QID) | ORAL | 0 refills | Status: DC | PRN

## 2017-10-16 MED ORDER — POTASSIUM CHLORIDE CRYS ER 20 MEQ PO TBCR
40.0000 meq | EXTENDED_RELEASE_TABLET | Freq: Once | ORAL | Status: AC
  Administered 2017-10-16: 40 meq via ORAL
  Filled 2017-10-16: qty 2

## 2017-10-16 MED ORDER — GABAPENTIN 300 MG PO CAPS
300.0000 mg | ORAL_CAPSULE | Freq: Three times a day (TID) | ORAL | Status: DC
  Administered 2017-10-16 – 2017-10-17 (×4): 300 mg via ORAL
  Filled 2017-10-16 (×4): qty 1

## 2017-10-16 MED ORDER — ASPIRIN EC 81 MG PO TBEC
162.0000 mg | DELAYED_RELEASE_TABLET | Freq: Every day | ORAL | Status: DC
  Administered 2017-10-16: 162 mg via ORAL
  Filled 2017-10-16: qty 2

## 2017-10-16 MED ORDER — ROPINIROLE HCL 0.5 MG PO TABS
0.5000 mg | ORAL_TABLET | Freq: Two times a day (BID) | ORAL | Status: DC
  Administered 2017-10-17 (×2): 0.5 mg via ORAL
  Filled 2017-10-16 (×2): qty 1

## 2017-10-16 NOTE — Plan of Care (Signed)
  Pain Managment: General experience of comfort will improve 10/16/2017 2018 - Progressing by Irish Lack, RN   Safety: Ability to remain free from injury will improve 10/16/2017 2018 - Progressing by Irish Lack, RN

## 2017-10-16 NOTE — Consult Note (Signed)
[facility] Primary Care Navigator  10/16/2017  Lisa Hamilton 04-Jun-1946 [id number]   Met with patientat the bedside to identify possible discharge needs. Patientreports having "increased mid to lower abdominal pain and distentionthathad ledto this admission/ surgery (status post umbilical hernia repair).  Patient endorses Dr.Elizabeth Crawford with [facility] [place] as herprimary care provider.   [facility] [place] and [facility] to obtain medications without any problem.   Patient states managing hermedications at home with husband's assistance using "pill box" system filled once a week.  Patient's husband Lisa Hamilton) has been providing transportation to herdoctors'appointments after discharge.  Patient lives at [place] husband who will be the primary caregiver upon discharge.  Anticipated discharge plan is homeper patient.  Patientvoiced understanding to call primary care provider's office for a post discharge follow-up appointment within1- 2 weeksor sooner if needs arise.Patient letter (with PCP's contact number) was provided asareminder.  Explained to [place] services available for health managementat home but she denieshavinganypressingneeds or concerns at this time and states "being able to manage health needs so far" (COPD/ HF).  Patientvoiced understandingof need to seek referral from primary care provider to [facility] care management if deemed necessary and appropriate for services in thefuture.  [facility] care management information provided for any future needs that may arise.  Patienthowever,verbally agreed and opted for EMMIcalls to follow-upwithher recovery at home.  Referral made forEMMIGeneralcalls after discharge.   For additional questions please contact:  Edwena Felty A. Kimiko Common, BSN, RN-BC [facility] PRIMARY CARE Navigator Cell: [phone]

## 2017-10-16 NOTE — Progress Notes (Addendum)
[facility] Progress Note  1 Day Post-Op  Subjective: CC: abdominal soreness Abdomen is sore when trying to get up. Passing flatus and had a small BM last night. Denies nausea or vomiting. Wants to go home today.  Objective: Vital signs in last 24 hours: Temp:  [97 F (36.1 C)-99.5 F (37.5 C)] 99.5 F (37.5 C) (03/14 0433) Pulse Rate:  [74-95] 74 (03/14 0433) Resp:  [16-18] 18 (03/14 0433) BP: (101-120)/(42-72) 114/42 (03/14 0433) SpO2:  [84 %-94 %] 94 % (03/14 0717) Last BM Date: 10/16/17  Intake/Output from previous day: 03/13 0701 - 03/14 0700 In: 850 [I.V.:850] Out: 1865 [Urine:350; Emesis/NG output:1475; Blood:40] Intake/Output this shift: No intake/output data recorded.  PE: Gen:  Alert, NAD, pleasant Card:  Regular rate and rhythm, pedal pulses 2+ BL Pulm:  Normal effort, clear to auscultation bilaterally Abd: Soft, appropriately tender, non-distended, bowel sounds present, no HSM, surgical dressing C/D/I Skin: warm and dry, no rashes  Psych: A&Ox3   Lab Results:  Recent Labs    10/14/17 2313 10/15/17 0416  WBC 12.6* 11.7*  HGB 14.7 14.1  HCT 45.7 45.4  PLT 315 285   BMET Recent Labs    10/14/17 2313 10/15/17 0416  NA 140 143  K 3.3* 2.9*  CL 94* 95*  CO2 31 35*  GLUCOSE 143* 134*  BUN 12 11  CREATININE 0.76 0.70  CALCIUM 9.8 9.6   PT/INR No results for input(s): LABPROT, INR in the last 72 hours. CMP     Component Value Date/Time   NA 143 10/15/2017 0416   NA 140 05/30/2015   K 2.9 (L) 10/15/2017 0416   CL 95 (L) 10/15/2017 0416   CO2 35 (H) 10/15/2017 0416   GLUCOSE 134 (H) 10/15/2017 0416   BUN 11 10/15/2017 0416   BUN 20 05/25/2014   CREATININE 0.70 10/15/2017 0416   CALCIUM 9.6 10/15/2017 0416   PROT 7.2 10/14/2017 2313   ALBUMIN 3.8 10/14/2017 2313   AST 25 10/14/2017 2313   ALT 14 10/14/2017 2313   ALKPHOS 122 10/14/2017 2313   BILITOT 1.2 10/14/2017 2313   GFRNONAA >60 10/15/2017 0416   GFRAA >60 10/15/2017  0416   Lipase  No results found for: LIPASE     Studies/Results: Ct Abdomen Pelvis W Contrast  Result Date: 10/15/2017 CLINICAL DATA:  Acute onset of nausea and vomiting. EXAM: CT ABDOMEN AND PELVIS WITH CONTRAST TECHNIQUE: Multidetector CT imaging of the abdomen and pelvis was performed using the standard protocol following bolus administration of intravenous contrast. CONTRAST:  155mL ISOVUE-300 IOPAMIDOL (ISOVUE-300) INJECTION 61% COMPARISON:  CT of the abdomen and pelvis from 07/14/2015 FINDINGS: Lower chest: Mild bibasilar atelectasis is noted. The visualized portions of the mediastinum are unremarkable. Fluid is noted filling the distal esophagus, likely reflecting gastroesophageal reflux. Hepatobiliary: Multiple cysts are again noted within the liver, the largest of which is stable in size, measuring approximately 16.4 cm, with minimal peripheral calcification. Trace fluid is noted tracking about the liver. The gallbladder is decompressed and grossly unremarkable. The common bile duct is grossly unremarkable in appearance. Pancreas: The pancreas is within normal limits. Spleen: The spleen is unremarkable in appearance. Trace ascites is noted tracking about the spleen. Adrenals/Urinary Tract: The adrenal glands are unremarkable in appearance. There is incomplete rotation of the right kidney. Nonobstructing bilateral renal stones measure up to 8 mm in size. There is no evidence of hydronephrosis. No obstructing ureteral stones are identified. Nonspecific perinephric stranding is noted bilaterally. Stomach/Bowel: There is dilatation  of small-bowel loops to 5.5 cm in diameter, with gradual decompression of the mid to distal ileum at the lower abdomen posterior to the patient's small periumbilical hernia. Trace air is noted within the more distal small bowel loops. This may reflect partial small-bowel obstruction secondary to an adhesion. Mild surrounding free fluid is noted, tracking into the pelvis  and along the left paracolic gutter. The stomach is largely filled with fluid. Mild diverticulosis is noted along the descending colon, without evidence of diverticulitis. The appendix is grossly unremarkable in appearance. Vascular/Lymphatic: The abdominal aorta is unremarkable in appearance. The inferior vena cava is grossly unremarkable. No retroperitoneal lymphadenopathy is seen. No pelvic sidewall lymphadenopathy is identified. Reproductive: The bladder is mildly distended and within normal limits. The uterus is grossly unremarkable in appearance. The ovaries are relatively symmetric. No suspicious adnexal masses are seen. Other: Diffuse soft tissue edema is seen along the pannus. Musculoskeletal: No acute osseous abnormalities are identified. Multilevel vacuum phenomenon is noted along the lumbar spine. There is grade 1 anterolisthesis of L4 on L5, reflecting underlying facet disease. The visualized musculature is unremarkable in appearance. IMPRESSION: 1. Diffuse dilatation of small-bowel loops to 5.5 cm in diameter, with gradual decompression of the mid to distal ileum at the lower abdomen posterior to the small periumbilical hernia. Trace air noted within more distal small bowel loops. This may reflect partial small-bowel obstruction secondary to adhesion. 2. Small amount of ascites noted within the abdomen and pelvis, likely reflecting the small bowel process. 3. Diffuse soft tissue edema noted along the pannus. 4. Fluid filling the distal esophagus, likely reflecting gastroesophageal reflux. 5. Multiple large hepatic cysts again noted, measuring up to 16.4 cm. 6. Nonobstructing bilateral renal stones measure up to 8 mm in size. 7. Mild diverticulosis along the descending colon, without evidence of diverticulitis. 8. Mild degenerative change along the lumbar spine. Electronically Signed   By: Garald Balding M.D.   On: 10/15/2017 01:26   Dg Abd Portable 1v  Result Date: 10/15/2017 CLINICAL DATA:   Small-bowel obstruction EXAM: PORTABLE ABDOMEN - 1 VIEW COMPARISON:  10/15/2017 FINDINGS: NG tube in the stomach with the tip in the body the stomach. Mild large and small bowel distention compatible with ileus. IMPRESSION: NG in the stomach Persistent ileus Electronically Signed   By: Franchot Gallo M.D.   On: 10/15/2017 14:10   Dg Abd Portable 1 View  Result Date: 10/15/2017 CLINICAL DATA:  72 year old female status post enteric tube placement. EXAM: PORTABLE ABDOMEN - 1 VIEW COMPARISON:  Abdominal CT dated 10/15/2017 FINDINGS: An enteric tube partially visualized extending down over the spine with side-port over the upper lumbar spine and tip in the mid abdomen at the level of the L3-L4 disc, likely in the distal stomach. Dilated loops of small bowel noted in the mid abdomen measuring up to 5.5 cm. IMPRESSION: Enteric tube with tip likely in the distal stomach. There is persistent dilatation of small-bowel loops. Electronically Signed   By: Anner Crete M.D.   On: 10/15/2017 03:15    Anti-infectives: Anti-infectives (From admission, onward)   Start     Dose/Rate Route Frequency Ordered Stop   10/15/17 0800  ceFAZolin (ANCEF) IVPB 2g/100 mL premix     2 g 200 mL/hr over 30 Minutes Intravenous  Once 10/15/17 0756 10/15/17 1020       Assessment/Plan HTN COPD Hypokalemia - K 2.9 yesterday, labs pending  SBO secondary to intermittently incarcerated umbilical hernia S/p ventral hernia repair with mesh 10/15/17 Dr. Georgette Dover -  POD#1 - patient had a BM last night - d/c NGT and start on soft diet - abdominal binder ordered - f/u and instructions in chart, sent Rx for oxy IR 5 mg #20 tabs 0 refill to pharmacy  FEN: soft diet, saline lock IV VTE: SCDs, lovenox ID: Ancef x1 preop  Plan: D/C NGT and start diet. If tolerating soft diet and potassium improved, ok with discharge home today. Discussed post-op instructions and follow up with patient.    LOS: 1 day    Brigid Re ,  [facility] 10/16/2017, 7:55 AM Pager: [phone] Consults: [phone] Mon-Fri 7:00 am-4:30 pm Sat-Sun 7:00 am-11:30 am

## 2017-10-16 NOTE — Progress Notes (Signed)
[facility] Progress Note  Patient: Lisa Hamilton ZWC:[id number]   PCP: Hoyt Koch, MD DOB: 1945/11/12   DOA: 10/14/2017   DOS: 10/16/2017   Date of Service: the patient was seen and examined on 10/16/2017  Subjective: Feeling better, mild abdominal soreness.  No cough no shortness of breath.  No dizziness no lightheadedness.  Somewhat fatigued.  Brief hospital course: Pt. with PMH of COPD, HTN, nephrolithiasis, umbilical hernia; admitted on 10/14/2017, presented with complaint of abdominal pain, was found to have incarcerated umbilical hernia with bowel obstruction.  Patient underwent hernia repair. Currently further plan is working on hypoxia.  Assessment and Plan: 1.  Incarcerated umbilical hernia. S/P surgical hernia repair as well as open reduction. Appreciate surgical assistance. Feels like patient can be discharged from their perspective after tolerating oral diet. Monitor.  2.  Acute hypoxic respiratory failure. COPD. Atelectasis. Patient has history of COPD, oxygenation drops to 80% on room air on ambulation. Currently requires 3 L of oxygen. Likely from postoperative atelectasis. Incentive spirometry will be provided as well as adequate pain control will be done. Monitor.  3.  Hypokalemia. Replacing orally. We will recheck tomorrow.  4.  Hepatic cyst. Incidentally seen on a CT scan.  Workup recommended at present.  5.  Chronic diastolic CHF. Appears to be euvolemic for now.  Holding Lasix patient actually received IV fluids. Monitor.  6.  Essential hypertension. Blood pressure stable for now. Continue pain control.  Diet: Soft diet DVT Prophylaxis: subcutaneous Heparin  Advance goals of care discussion: full code  Family Communication: family was present at bedside, at the time of interview. The pt provided permission to discuss medical plan with the family. Opportunity was given to ask question and all questions were answered satisfactorily.    Disposition:  Discharge to home.  Consultants: General surgery  Procedures: Hernia repair  Antibiotics: Anti-infectives (From admission, onward)   Start     Dose/Rate Route Frequency Ordered Stop   10/15/17 0800  ceFAZolin (ANCEF) IVPB 2g/100 mL premix     2 g 200 mL/hr over 30 Minutes Intravenous  Once 10/15/17 0756 10/15/17 1020       Objective: Physical Exam: Vitals:   10/16/17 0926 10/16/17 0929 10/16/17 1104 10/16/17 1300  BP:    128/63  Pulse:    93  Resp:    20  Temp:    99.3 F (37.4 C)  TempSrc:    Oral  SpO2: (!) 87% 96% 96% 94%  Weight:      Height:        Intake/Output Summary (Last 24 hours) at 10/16/2017 1523 Last data filed at 10/16/2017 1345 Gross per 24 hour  Intake 600 ml  Output 2076 ml  Net -1476 ml   Filed Weights   10/14/17 2238  Weight: 90.7 kg (200 lb)   General: Alert, Awake and Oriented to Time, Place and Person. Appear in mild distress, affect appropriate Eyes: PERRL, Conjunctiva normal ENT: Oral Mucosa clear moist. Neck: no JVD, no Abnormal Mass Or lumps Cardiovascular: S1 and S2 Present, no Murmur, Peripheral Pulses Present Respiratory: normal respiratory effort, Bilateral Air entry equal and Decreased, no use of accessory muscle, basal Crackles, no wheezes Abdomen: Bowel Sound present, Soft and mild tenderness, no hernia Skin: no redness, no Rash, no induration Extremities: no Pedal edema, no calf tenderness Neurologic: Grossly no focal neuro deficit. Bilaterally Equal motor strength  Data Reviewed: CBC: Recent Labs  Lab 10/14/17 2313 10/15/17 0416 10/16/17 0726  WBC 12.6* 11.7* 8.8  NEUTROABS 10.1*  --   --   HGB 14.7 14.1 12.8  HCT 45.7 45.4 42.8  MCV 95.0 97.2 99.8  PLT 315 285 025   Basic Metabolic Panel: Recent Labs  Lab 10/14/17 2313 10/15/17 0416 10/16/17 0726  NA 140 143 145  K 3.3* 2.9* 3.1*  CL 94* 95* 101  CO2 31 35* 32  GLUCOSE 143* 134* 92  BUN 12 11 13   CREATININE 0.76 0.70 0.68  CALCIUM 9.8  9.6 8.5*  MG  --  2.1  --     Liver Function Tests: Recent Labs  Lab 10/14/17 2313  AST 25  ALT 14  ALKPHOS 122  BILITOT 1.2  PROT 7.2  ALBUMIN 3.8   No results for input(s): LIPASE, AMYLASE in the last 168 hours. No results for input(s): AMMONIA in the last 168 hours. Coagulation Profile: No results for input(s): INR, PROTIME in the last 168 hours. Cardiac Enzymes: No results for input(s): CKTOTAL, CKMB, CKMBINDEX, TROPONINI in the last 168 hours. BNP (last 3 results) No results for input(s): PROBNP in the last 8760 hours. CBG: No results for input(s): GLUCAP in the last 168 hours. Studies: Dg Chest Port 1 View  Result Date: 10/16/2017 CLINICAL DATA:  72 year old female postoperative day 1 from incarcerated ventral abdominal hernia repair. Shortness of breath. EXAM: PORTABLE CHEST 1 VIEW COMPARISON:  06/02/2013 chest radiographs. Preoperative CT Abdomen and Pelvis 10/15/2017 FINDINGS: Seated upright AP portable view at 1252 hours. Persistent mild patchy opacity at both lung bases which most resembles atelectasis as on the recent CT. Stable mild cardiomegaly. Other mediastinal contours are within normal limits. Stable tracheal air column since 2014. No pneumothorax or pulmonary edema. No definite pleural effusion. No definite pneumoperitoneum and unremarkable visible bowel gas in the left upper quadrant. IMPRESSION: Low lung volumes with mild atelectasis. No other acute cardiopulmonary abnormality identified. Electronically Signed   By: Genevie Ann M.D.   On: 10/16/2017 13:33    Scheduled Meds: . albuterol  2.5 mg Nebulization QID  . enoxaparin (LOVENOX) injection  40 mg Subcutaneous Q24H  . methocarbamol  500 mg Oral TID   Continuous Infusions: PRN Meds: acetaminophen **OR** acetaminophen, albuterol, ondansetron **OR** ondansetron (ZOFRAN) IV, oxyCODONE  Time spent: 35 minutes  Author: Berle Mull, MD [facility] Pager: [phone] 10/16/2017 3:23 PM  If 7PM-7AM,  please contact night-coverage at [email or web address], password [facility]

## 2017-10-16 NOTE — Progress Notes (Signed)
SATURATION QUALIFICATIONS: (This note is used to comply with regulatory documentation for home oxygen)  Patient Saturations on Room Air at Rest = 88%  Patient Saturations on Room Air while Ambulating = 80%  Patient Saturations on 2 Liters of oxygen while Ambulating = 93%  Please briefly explain why patient needs home oxygen: 

## 2017-10-17 LAB — BRAIN NATRIURETIC PEPTIDE: B Natriuretic Peptide: 107.1 pg/mL — ABNORMAL HIGH (ref 0.0–100.0)

## 2017-10-17 MED ORDER — METHOCARBAMOL 500 MG PO TABS: 500.0000 mg | ORAL_TABLET | Freq: Three times a day (TID) | ORAL | 0 refills | Status: DC | PRN

## 2017-10-17 MED ORDER — ALBUTEROL SULFATE (2.5 MG/3ML) 0.083% IN NEBU
2.5000 mg | INHALATION_SOLUTION | Freq: Three times a day (TID) | RESPIRATORY_TRACT | Status: DC
  Administered 2017-10-17: 2.5 mg via RESPIRATORY_TRACT
  Filled 2017-10-17: qty 3

## 2017-10-17 NOTE — Progress Notes (Signed)
[facility] Progress Note  2 Days Post-Op  Subjective: CC: no new complaints Patient not discharged yesterday because oxygen saturations were low with ambulation, patient hopeful to be discharged today. Tolerating diet and having bowel function. Pain well controlled. UOP good.   Objective: Vital signs in last 24 hours: Temp:  [98.4 F (36.9 C)-99.7 F (37.6 C)] 98.4 F (36.9 C) (03/15 0517) Pulse Rate:  [71-93] 71 (03/15 0517) Resp:  [18-20] 18 (03/15 0517) BP: (119-133)/(55-63) 119/59 (03/15 0517) SpO2:  [87 %-97 %] 97 % (03/15 0517) FiO2 (%):  [2 %] 2 % (03/15 0517) Weight:  [89.6 kg (197 lb 8 oz)] 89.6 kg (197 lb 8 oz) (03/15 0517) Last BM Date: 10/16/17  Intake/Output from previous day: 03/14 0701 - 03/15 0700 In: 840 [P.O.:840] Out: 251 [Urine:250; Stool:1] Intake/Output this shift: No intake/output data recorded.  PE: Gen:  Alert, NAD, pleasant Card:  Regular rate and rhythm, pedal pulses 2+ BL Pulm:  Normal effort, clear to auscultation bilaterally Abd: Soft, appropriately tender, non-distended, bowel sounds present, no HSM, surgical dressing C/D/I Skin: warm and dry, no rashes  Psych: A&Ox3   Lab Results:  Recent Labs    10/15/17 0416 10/16/17 0726  WBC 11.7* 8.8  HGB 14.1 12.8  HCT 45.4 42.8  PLT 285 250   BMET Recent Labs    10/15/17 0416 10/16/17 0726  NA 143 145  K 2.9* 3.1*  CL 95* 101  CO2 35* 32  GLUCOSE 134* 92  BUN 11 13  CREATININE 0.70 0.68  CALCIUM 9.6 8.5*   PT/INR No results for input(s): LABPROT, INR in the last 72 hours. CMP     Component Value Date/Time   NA 145 10/16/2017 0726   NA 140 05/30/2015   K 3.1 (L) 10/16/2017 0726   CL 101 10/16/2017 0726   CO2 32 10/16/2017 0726   GLUCOSE 92 10/16/2017 0726   BUN 13 10/16/2017 0726   BUN 20 05/25/2014   CREATININE 0.68 10/16/2017 0726   CALCIUM 8.5 (L) 10/16/2017 0726   PROT 7.2 10/14/2017 2313   ALBUMIN 3.8 10/14/2017 2313   AST 25 10/14/2017 2313   ALT  14 10/14/2017 2313   ALKPHOS 122 10/14/2017 2313   BILITOT 1.2 10/14/2017 2313   GFRNONAA >60 10/16/2017 0726   GFRAA >60 10/16/2017 0726   Lipase  No results found for: LIPASE     Studies/Results: Dg Chest Port 1 View  Result Date: 10/16/2017 CLINICAL DATA:  72 year old female postoperative day 1 from incarcerated ventral abdominal hernia repair. Shortness of breath. EXAM: PORTABLE CHEST 1 VIEW COMPARISON:  06/02/2013 chest radiographs. Preoperative CT Abdomen and Pelvis 10/15/2017 FINDINGS: Seated upright AP portable view at 1252 hours. Persistent mild patchy opacity at both lung bases which most resembles atelectasis as on the recent CT. Stable mild cardiomegaly. Other mediastinal contours are within normal limits. Stable tracheal air column since 2014. No pneumothorax or pulmonary edema. No definite pleural effusion. No definite pneumoperitoneum and unremarkable visible bowel gas in the left upper quadrant. IMPRESSION: Low lung volumes with mild atelectasis. No other acute cardiopulmonary abnormality identified. Electronically Signed   By: Genevie Ann M.D.   On: 10/16/2017 13:33   Dg Abd Portable 1v  Result Date: 10/15/2017 CLINICAL DATA:  Small-bowel obstruction EXAM: PORTABLE ABDOMEN - 1 VIEW COMPARISON:  10/15/2017 FINDINGS: NG tube in the stomach with the tip in the body the stomach. Mild large and small bowel distention compatible with ileus. IMPRESSION: NG in the stomach Persistent ileus  Electronically Signed   By: Franchot Gallo M.D.   On: 10/15/2017 14:10    Anti-infectives: Anti-infectives (From admission, onward)   Start     Dose/Rate Route Frequency Ordered Stop   10/15/17 0800  ceFAZolin (ANCEF) IVPB 2g/100 mL premix     2 g 200 mL/hr over 30 Minutes Intravenous  Once 10/15/17 0756 10/15/17 1020       Assessment/Plan HTN COPD Hypokalemia - K 3.1 yesterday, can replace PO  SBO secondary to intermittently incarcerated umbilical hernia S/p ventral hernia repair with  mesh 10/15/17 Dr. Georgette Dover - POD#3 - tolerating soft diet - abdominal binder while out of bed - f/u and instructions in chart  FEN: soft diet, saline lock IV VTE: SCDs, lovenox ID: Ancef x1 preop  Plan: Stable for discharge home from a surgery standpoint. Discussed post-op instructions and follow up with patient.     LOS: 2 days    Brigid Re , [facility] Surgery 10/17/2017, 8:01 AM Pager: [phone] Consults: [phone] Mon-Fri 7:00 am-4:30 pm Sat-Sun 7:00 am-11:30 am

## 2017-10-17 NOTE — Progress Notes (Signed)
Lisa Hamilton Severe to be D/C'd  per MD order. Discussed with the patient and all questions fully answered.  VSS, Skin clean, dry and intact without evidence of skin break down, no evidence of skin tears noted.  IV catheter discontinued intact. Site without signs and symptoms of complications. Dressing and pressure applied.  An After Visit Summary was printed and given to the patient. Patient received prescription.  D/c education completed with patient/family including follow up instructions, medication list, d/c activities limitations if indicated, with other d/c instructions as indicated by MD - patient able to verbalize understanding, all questions fully answered.   Patient instructed to return to ED, call 911, or call MD for any changes in condition.   Patient to be escorted via [place], and D/C home via private auto.

## 2017-10-17 NOTE — Progress Notes (Signed)
SATURATION QUALIFICATIONS: (This note is used to comply with regulatory documentation for home oxygen)  Patient Saturations on Room Air at Rest = 94%  Patient Saturations on Room Air while Ambulating = 94%  Patient Saturations on 2 Liters of oxygen while Ambulating = 97%  Please briefly explain why patient needs home oxygen:  Patient doesn't need home O2

## 2017-10-20 ENCOUNTER — Telehealth: Payer: Self-pay | Admitting: Podiatry

## 2017-10-20 NOTE — Telephone Encounter (Signed)
I would like to talk to [facility], Dr. Amalia Hailey nurse about the foot operation that unfortunately never happened. I would like to talk to her about that and when we might be able to reschedule. You can call me back at [phone]. Thank you so much.

## 2017-10-20 NOTE — Discharge Summary (Signed)
[facility] Discharge Summary   Patient: Lisa Hamilton QQI:[id number]   PCP: Hoyt Koch, MD DOB: [birth year]   Date of admission: 10/14/2017   Date of discharge: 10/17/2017   Discharge Diagnoses:  Principal Problem:   Partial small bowel obstruction (HCC) Active Problems:   Asthma in adult, mild persistent, uncomplicated   Hypokalemia   Nephrolithiasis  Admitted From: home Disposition: Home with family  Recommendations for Outpatient Follow-up:  1. Please follow-up with PCP in 1 week as well as with general surgery as recommended.  Follow-up Information    Donnie Mesa, MD. Go on 10/29/2017.   Specialty:  General Surgery Why:  Your appointment is at 1:30 PM. Please arrive 30 min prior to appointment time. Bring photo ID and insurance information.  Contact information: [address] [place] [address] [phone]        Hoyt Koch, MD. Schedule an appointment as soon as possible for a visit in 1 week(s).   Specialty:  Internal Medicine Contact information: [address] [place] [address] [phone]          Diet recommendation: Soft diet  Activity: The patient is advised to gradually reintroduce usual activities.  Discharge Condition: good  Code Status: Full code  History of present illness: As per the H and P dictated on admission, "Lisa Hamilton is a 72 y.o. female with medical history significant of COPD, HTN, nephrolithiasis, umbilical hernia, and benign hepatic cyst; who presents with complaints of worsening lower abdominal pain with constipation over the last 5 days.  She reports having severe pain with significant protrusion of her umbilical hernia that is been present for many years.  Tried taking over-the-counter stool softeners without relief of symptoms.  Associated symptoms include poor p.o. intake, nausea, vomiting, shortness of breath, left lower extremity swelling(she reports is chronic, but acutely  worse).  Denies having any significant fevers, chills, dysuria, recent sick contacts.  Patient last notes having abdominal surgery to evaluate hepatic cyst done by Dr. Barry Dienes back in 2012 that were noted to be benign. "  Hospital Course:  Summary of her active problems in the hospital is as following. 1.  Incarcerated umbilical hernia. S/P surgical hernia repair Appreciate surgical assistance. Recommendation was patient can be discharged from their perspective after tolerating oral diet.  2.  Acute hypoxic respiratory failure. COPD. Atelectasis. Patient has history of COPD, oxygenation drops to 80% on room air on ambulation. Post op requires 3 L of oxygen. Likely from postoperative atelectasis. Incentive spirometry was provided, With significant improvement in oxygenationand resolution of hypoxia.  3.  Hypokalemia. Replacing orally.  4.  Hepatic cyst. Incidentally seen on a CT scan. No further Workup recommended at present.  5.  Chronic diastolic CHF. Appears to be euvolemic for now.  Holding Lasix patient actually received IV fluids.  6.  Essential hypertension. Blood pressure stable for now. Continue pain control.  All other chronic medical condition were stable during the hospitalization.  Patient was ambulatory without any assistance. On the day of the discharge the patient's vitals were stable, and no other acute medical condition were reported by patient. the patient was felt safe to be discharge at home with family.  Procedures and Results:  Incarcerated ventral hernia repair  Consultations:  General surgery  DISCHARGE MEDICATION: Allergies as of 10/17/2017      Reactions   Adhesive [tape] Other (See Comments)   blisters   Codeine Other (See Comments)   nightmares  Amoxicillin-pot Clavulanate Rash   Penicillins Rash      Medication List    STOP taking these medications   oxyCODONE-acetaminophen 5-325 MG tablet Commonly known as:  ROXICET       TAKE these medications   albuterol (2.5 MG/3ML) 0.083% nebulizer solution Commonly known as:  PROVENTIL USE 1 VIAL IN NEBULIZER 4 TIMES DAILY. Generic: VENTOLIN   aspirin 81 MG tablet Take 162 mg by mouth at bedtime.   Calcium Carbonate-Vitamin D 600-400 MG-UNIT tablet Take 1 tablet by mouth 3 (three) times daily.   clonazePAM 0.5 MG tablet Commonly known as:  KLONOPIN 1-3 taken 30 minutes before bed What changed:    how much to take  how to take this  when to take this  additional instructions   Co-Enzyme Q-10 100 MG Caps Take 200 mg by mouth daily.   furosemide 40 MG tablet Commonly known as:  LASIX Take 1 tablet (40 mg total) by mouth 3 (three) times daily.   gabapentin 300 MG capsule Commonly known as:  NEURONTIN TAKE 1 CAPSULE BY MOUTH 3  TIMES DAILY   Krill Oil Ultra Strength 1500 MG Caps Take 1,500 mg by mouth daily.   losartan 100 MG tablet Commonly known as:  COZAAR Take 1 tablet (100 mg total) by mouth daily.   Magnesium 400 MG Caps Take 400 mg by mouth 2 (two) times daily.   meloxicam 15 MG tablet Commonly known as:  MOBIC Take 15 mg by mouth daily.   methocarbamol 500 MG tablet Commonly known as:  ROBAXIN Take 1 tablet (500 mg total) by mouth every 8 (eight) hours as needed for muscle spasms.   montelukast 10 MG tablet Commonly known as:  SINGULAIR Take 1 tablet (10 mg total) by mouth at bedtime.   MUCINEX 600 MG 12 hr tablet Generic drug:  guaiFENesin Take 600 mg by mouth 2 (two) times daily as needed. Reported on 12/22/2015   multivitamin capsule Take 1 capsule by mouth daily. Iron free   oxyCODONE 5 MG immediate release tablet Commonly known as:  Oxy IR/ROXICODONE Take 1 tablet (5 mg total) by mouth every 6 (six) hours as needed for moderate pain or severe pain.   potassium chloride SA 20 MEQ tablet Commonly known as:  K-DUR,KLOR-CON Take 2 tablets (40 mEq total) by mouth daily.   pravastatin 40 MG tablet Commonly known as:   PRAVACHOL Take 1 tablet (40 mg total) by mouth daily.   rOPINIRole 0.5 MG tablet Commonly known as:  REQUIP Up to 6 daily as needed What changed:    how much to take  how to take this  when to take this  additional instructions   traMADol 50 MG tablet Commonly known as:  ULTRAM Take 1 tablet (50 mg total) by mouth daily as needed.   triamcinolone cream 0.1 % Commonly known as:  KENALOG Apply 1 application topically 2 (two) times daily. What changed:    when to take this  reasons to take this   Vitamin D 1000 units capsule Take 2,000 Units by mouth daily. VITAMIN D 3      Allergies  Allergen Reactions  . Adhesive [Tape] Other (See Comments)    blisters  . Codeine Other (See Comments)    nightmares  . Amoxicillin-Pot Clavulanate Rash  . Penicillins Rash   Discharge Instructions    Diet - low sodium heart healthy   Complete by:  As directed    Discharge instructions   Complete by:  As  directed    It is important that you read following instructions as well as go over your medication list with RN to help you understand your care after this hospitalization.  Discharge Instructions: Please follow-up with PCP in one week  Please request your primary care physician to go over all Hospital Tests and Procedure/Radiological results at the follow up,  Please get all Hospital records sent to your PCP by signing hospital release before you go home.   Do not drive, operating heavy machinery, perform activities at heights, swimming or participation in water activities or provide baby sitting services while you are on Pain, Sleep and Anxiety Medications; until you have been seen by Primary Care Physician or a Neurologist and advised to do so again. Do not take more than prescribed Pain, Sleep and Anxiety Medications. You were cared for by a hospitalist during your hospital stay. If you have any questions about your discharge medications or the care you received while you were  in the hospital after you are discharged, you can call the unit and ask to speak with the hospitalist on call if the hospitalist that took care of you is not available.  Once you are discharged, your primary care physician will handle any further medical issues. Please note that NO REFILLS for any discharge medications will be authorized once you are discharged, as it is imperative that you return to your primary care physician (or establish a relationship with a primary care physician if you do not have one) for your aftercare needs so that they can reassess your need for medications and monitor your lab values. You Must read complete instructions/literature along with all the possible adverse reactions/side effects for all the Medicines you take and that have been prescribed to you. Take any new Medicines after you have completely understood and accept all the possible adverse reactions/side effects. Wear Seat belts while driving. If you have smoked or chewed Tobacco in the last 2 yrs please stop smoking and/or stop any Recreational drug use.   Increase activity slowly   Complete by:  As directed      Discharge Exam: Filed Weights   10/14/17 2238 10/17/17 0517  Weight: 90.7 kg (200 lb) 89.6 kg (197 lb 8 oz)   Vitals:   10/16/17 2212 10/17/17 0517  BP: (!) 133/55 (!) 119/59  Pulse: 92 71  Resp: 18 18  Temp: 99.7 F (37.6 C) 98.4 F (36.9 C)  SpO2: 97% 97%   General: Appear in no distress, no Rash; Oral Mucosa moist. Cardiovascular: S1 and S2 Present, no Murmur, no JVD Respiratory: Bilateral Air entry present and Clear to Auscultation, no Crackles, no wheezes Abdomen: Bowel Sound present, Soft and mild tenderness Extremities: no Pedal edema, no calf tenderness Neurology: Grossly no focal neuro deficit.  The results of significant diagnostics from this hospitalization (including imaging, microbiology, ancillary and laboratory) are listed below for reference.    Significant Diagnostic  Studies: Ct Abdomen Pelvis W Contrast  Result Date: 10/15/2017 CLINICAL DATA:  Acute onset of nausea and vomiting. EXAM: CT ABDOMEN AND PELVIS WITH CONTRAST TECHNIQUE: Multidetector CT imaging of the abdomen and pelvis was performed using the standard protocol following bolus administration of intravenous contrast. CONTRAST:  130mL ISOVUE-300 IOPAMIDOL (ISOVUE-300) INJECTION 61% COMPARISON:  CT of the abdomen and pelvis from 07/14/2015 FINDINGS: Lower chest: Mild bibasilar atelectasis is noted. The visualized portions of the mediastinum are unremarkable. Fluid is noted filling the distal esophagus, likely reflecting gastroesophageal reflux. Hepatobiliary: Multiple cysts are again  noted within the liver, the largest of which is stable in size, measuring approximately 16.4 cm, with minimal peripheral calcification. Trace fluid is noted tracking about the liver. The gallbladder is decompressed and grossly unremarkable. The common bile duct is grossly unremarkable in appearance. Pancreas: The pancreas is within normal limits. Spleen: The spleen is unremarkable in appearance. Trace ascites is noted tracking about the spleen. Adrenals/Urinary Tract: The adrenal glands are unremarkable in appearance. There is incomplete rotation of the right kidney. Nonobstructing bilateral renal stones measure up to 8 mm in size. There is no evidence of hydronephrosis. No obstructing ureteral stones are identified. Nonspecific perinephric stranding is noted bilaterally. Stomach/Bowel: There is dilatation of small-bowel loops to 5.5 cm in diameter, with gradual decompression of the mid to distal ileum at the lower abdomen posterior to the patient's small periumbilical hernia. Trace air is noted within the more distal small bowel loops. This may reflect partial small-bowel obstruction secondary to an adhesion. Mild surrounding free fluid is noted, tracking into the pelvis and along the left paracolic gutter. The stomach is largely filled  with fluid. Mild diverticulosis is noted along the descending colon, without evidence of diverticulitis. The appendix is grossly unremarkable in appearance. Vascular/Lymphatic: The abdominal aorta is unremarkable in appearance. The inferior vena cava is grossly unremarkable. No retroperitoneal lymphadenopathy is seen. No pelvic sidewall lymphadenopathy is identified. Reproductive: The bladder is mildly distended and within normal limits. The uterus is grossly unremarkable in appearance. The ovaries are relatively symmetric. No suspicious adnexal masses are seen. Other: Diffuse soft tissue edema is seen along the pannus. Musculoskeletal: No acute osseous abnormalities are identified. Multilevel vacuum phenomenon is noted along the lumbar spine. There is grade 1 anterolisthesis of L4 on L5, reflecting underlying facet disease. The visualized musculature is unremarkable in appearance. IMPRESSION: 1. Diffuse dilatation of small-bowel loops to 5.5 cm in diameter, with gradual decompression of the mid to distal ileum at the lower abdomen posterior to the small periumbilical hernia. Trace air noted within more distal small bowel loops. This may reflect partial small-bowel obstruction secondary to adhesion. 2. Small amount of ascites noted within the abdomen and pelvis, likely reflecting the small bowel process. 3. Diffuse soft tissue edema noted along the pannus. 4. Fluid filling the distal esophagus, likely reflecting gastroesophageal reflux. 5. Multiple large hepatic cysts again noted, measuring up to 16.4 cm. 6. Nonobstructing bilateral renal stones measure up to 8 mm in size. 7. Mild diverticulosis along the descending colon, without evidence of diverticulitis. 8. Mild degenerative change along the lumbar spine. Electronically Signed   By: Garald Balding M.D.   On: 10/15/2017 01:26   Dg Chest Port 1 View  Result Date: 10/16/2017 CLINICAL DATA:  72 year old female postoperative day 1 from incarcerated ventral  abdominal hernia repair. Shortness of breath. EXAM: PORTABLE CHEST 1 VIEW COMPARISON:  06/02/2013 chest radiographs. Preoperative CT Abdomen and Pelvis 10/15/2017 FINDINGS: Seated upright AP portable view at 1252 hours. Persistent mild patchy opacity at both lung bases which most resembles atelectasis as on the recent CT. Stable mild cardiomegaly. Other mediastinal contours are within normal limits. Stable tracheal air column since 2014. No pneumothorax or pulmonary edema. No definite pleural effusion. No definite pneumoperitoneum and unremarkable visible bowel gas in the left upper quadrant. IMPRESSION: Low lung volumes with mild atelectasis. No other acute cardiopulmonary abnormality identified. Electronically Signed   By: Genevie Ann M.D.   On: 10/16/2017 13:33   Dg Abd Portable 1v  Result Date: 10/15/2017 CLINICAL DATA:  Small-bowel  obstruction EXAM: PORTABLE ABDOMEN - 1 VIEW COMPARISON:  10/15/2017 FINDINGS: NG tube in the stomach with the tip in the body the stomach. Mild large and small bowel distention compatible with ileus. IMPRESSION: NG in the stomach Persistent ileus Electronically Signed   By: Franchot Gallo M.D.   On: 10/15/2017 14:10   Dg Abd Portable 1 View  Result Date: 10/15/2017 CLINICAL DATA:  72 year old female status post enteric tube placement. EXAM: PORTABLE ABDOMEN - 1 VIEW COMPARISON:  Abdominal CT dated 10/15/2017 FINDINGS: An enteric tube partially visualized extending down over the spine with side-port over the upper lumbar spine and tip in the mid abdomen at the level of the L3-L4 disc, likely in the distal stomach. Dilated loops of small bowel noted in the mid abdomen measuring up to 5.5 cm. IMPRESSION: Enteric tube with tip likely in the distal stomach. There is persistent dilatation of small-bowel loops. Electronically Signed   By: Anner Crete M.D.   On: 10/15/2017 03:15   Dg Foot Complete Left  Result Date: 09/24/2017 Please see detailed radiograph report in office  note.   Microbiology: No results found for this or any previous visit (from the past 240 hour(s)).   Labs: CBC: Recent Labs  Lab 10/14/17 2313 10/15/17 0416 10/16/17 0726  WBC 12.6* 11.7* 8.8  NEUTROABS 10.1*  --   --   HGB 14.7 14.1 12.8  HCT 45.7 45.4 42.8  MCV 95.0 97.2 99.8  PLT 315 285 656   Basic Metabolic Panel: Recent Labs  Lab 10/14/17 2313 10/15/17 0416 10/16/17 0726  NA 140 143 145  K 3.3* 2.9* 3.1*  CL 94* 95* 101  CO2 31 35* 32  GLUCOSE 143* 134* 92  BUN 12 11 13   CREATININE 0.76 0.70 0.68  CALCIUM 9.8 9.6 8.5*  MG  --  2.1  --    Liver Function Tests: Recent Labs  Lab 10/14/17 2313  AST 25  ALT 14  ALKPHOS 122  BILITOT 1.2  PROT 7.2  ALBUMIN 3.8   BNP (last 3 results) Recent Labs    10/17/17 0451  BNP 107.1*   CBG: No results for input(s): GLUCAP in the last 168 hours. Time spent: 35 minutes  Signed:  Berle Mull  [facility] 10/17/2017 , 8:24 AM

## 2017-10-22 ENCOUNTER — Other Ambulatory Visit: Payer: Medicare Other

## 2017-10-23 ENCOUNTER — Telehealth: Payer: Self-pay | Admitting: *Deleted

## 2017-10-23 NOTE — Telephone Encounter (Signed)
I am returning your call how can I help you?  "I haven't been released by my doctor yet.  They don't want me having another surgery yet.  Should I call you or the surgery center when I'm released?"  You will need to call me.  I will get you scheduled.  "What's your name?"  My name is Denelda Akerley.

## 2017-10-28 ENCOUNTER — Ambulatory Visit (INDEPENDENT_AMBULATORY_CARE_PROVIDER_SITE_OTHER): Payer: Medicare Other | Admitting: Internal Medicine

## 2017-10-28 ENCOUNTER — Encounter: Payer: Self-pay | Admitting: Gastroenterology

## 2017-10-28 ENCOUNTER — Other Ambulatory Visit (INDEPENDENT_AMBULATORY_CARE_PROVIDER_SITE_OTHER): Payer: Medicare Other

## 2017-10-28 ENCOUNTER — Encounter: Payer: Self-pay | Admitting: Internal Medicine

## 2017-10-28 VITALS — BP 110/70 | HR 63 | Temp 97.7°F | Ht 60.0 in | Wt 196.0 lb

## 2017-10-28 DIAGNOSIS — R739 Hyperglycemia, unspecified: Secondary | ICD-10-CM

## 2017-10-28 DIAGNOSIS — I10 Essential (primary) hypertension: Secondary | ICD-10-CM

## 2017-10-28 DIAGNOSIS — K56600 Partial intestinal obstruction, unspecified as to cause: Secondary | ICD-10-CM

## 2017-10-28 DIAGNOSIS — K7689 Other specified diseases of liver: Secondary | ICD-10-CM

## 2017-10-28 DIAGNOSIS — E876 Hypokalemia: Secondary | ICD-10-CM

## 2017-10-28 LAB — LIPID PANEL
Cholesterol: 156 mg/dL (ref 0–200)
HDL: 36.7 mg/dL — ABNORMAL LOW (ref >39.00)
LDL Cholesterol: 88 mg/dL (ref 0–99)
NONHDL: 119.13
TRIGLYCERIDES: 158 mg/dL — AB (ref 0.0–149.0)
Total CHOL/HDL Ratio: 4
VLDL: 31.6 mg/dL (ref 0.0–40.0)

## 2017-10-28 LAB — COMPREHENSIVE METABOLIC PANEL
ALBUMIN: 3.6 g/dL (ref 3.5–5.2)
ALT: 28 U/L (ref 0–35)
AST: 27 U/L (ref 0–37)
Alkaline Phosphatase: 162 U/L — ABNORMAL HIGH (ref 39–117)
BILIRUBIN TOTAL: 0.5 mg/dL (ref 0.2–1.2)
BUN: 16 mg/dL (ref 6–23)
CO2: 36 meq/L — AB (ref 19–32)
CREATININE: 0.57 mg/dL (ref 0.40–1.20)
Calcium: 9.2 mg/dL (ref 8.4–10.5)
Chloride: 100 mEq/L (ref 96–112)
GFR: 111.34 mL/min (ref >60.00)
Glucose, Bld: 98 mg/dL (ref 70–99)
Potassium: 4.2 mEq/L (ref 3.5–5.1)
Sodium: 140 mEq/L (ref 135–145)
Total Protein: 6.6 g/dL (ref 6.0–8.3)

## 2017-10-28 LAB — HEMOGLOBIN A1C: Hgb A1c MFr Bld: 6 % (ref 4.6–6.5)

## 2017-10-28 NOTE — Patient Instructions (Addendum)
We will check the labs today and call you back about the results.    

## 2017-10-28 NOTE — Progress Notes (Signed)
   Subjective:    Patient ID: Lisa Hamilton, female    DOB: 1946-04-02, 72 y.o.   MRN: [id number]  HPI The patient is a 72 YO female coming in for hospital follow up (in for hernia repair and SBO, with NG tube decompression). She is still healing from the hernia repair. She denies further constipation. She is still taking narcotics for pain in the evening time before bed. She denies diarrhea. She is still taking stool softener and laxative daily while on the narcotic. She denies significant pain but is still fairly limited in her activities. She denies chest pains or SOB. She denies leg swelling. She denies headaches. No falls since being home. Denies any problems or side effects of medications currently.   PMH, [facility], social history reviewed and updated.   Review of Systems  Constitutional: Positive for activity change and appetite change. Negative for chills, fatigue, fever and unexpected weight change.  HENT: Negative.   Eyes: Negative.   Respiratory: Negative for cough, chest tightness and shortness of breath.   Cardiovascular: Negative for chest pain, palpitations and leg swelling.  Gastrointestinal: Positive for abdominal distention and abdominal pain. Negative for constipation, diarrhea, nausea and vomiting.  Musculoskeletal: Negative.   Skin: Negative.   Neurological: Positive for weakness.  Psychiatric/Behavioral: Negative.       Objective:   Physical Exam  Constitutional: She is oriented to person, place, and time. She appears well-developed and well-nourished.  overweight  HENT:  Head: Normocephalic and atraumatic.  Eyes: EOM are normal.  Neck: Normal range of motion.  Cardiovascular: Normal rate and regular rhythm.  Pulmonary/Chest: Effort normal and breath sounds normal. No respiratory distress. She has no wheezes. She has no rales.  Abdominal: Soft. Bowel sounds are normal. She exhibits no distension. There is no tenderness. There is no rebound.  Wound covered, some mild  distention, no pain with palpation and normal BS  Musculoskeletal: She exhibits no edema.  Neurological: She is alert and oriented to person, place, and time. Coordination normal.  Skin: Skin is warm and dry.  Psychiatric: She has a normal mood and affect.   Vitals:   10/28/17 1421  BP: 110/70  Pulse: 63  Temp: 97.7 F (36.5 C)  TempSrc: Oral  SpO2: 94%  Weight: 196 lb (88.9 kg)  Height: 5' (1.524 m)      Assessment & Plan:

## 2017-10-29 ENCOUNTER — Encounter: Payer: Medicare Other | Admitting: Podiatry

## 2017-10-30 ENCOUNTER — Encounter: Payer: Self-pay | Admitting: Internal Medicine

## 2017-10-30 NOTE — Assessment & Plan Note (Signed)
Resolved and she is still passing BM daily with regimen. As she stops narcotics altogether she can likely decrease regimen.

## 2017-10-30 NOTE — Assessment & Plan Note (Signed)
Patient did not get results of CT in the hospital so reviewed with her that her liver cysts are stable in size from prior.

## 2017-10-30 NOTE — Assessment & Plan Note (Signed)
Checking HgA1c since we are doing blood work today.

## 2017-10-30 NOTE — Assessment & Plan Note (Signed)
Checking CMP and adjust as needed. Likely from ng tube decompression in the hospital without adequate replacement.

## 2017-11-20 DIAGNOSIS — H35372 Puckering of macula, left eye: Secondary | ICD-10-CM | POA: Diagnosis not present

## 2017-11-20 DIAGNOSIS — Z961 Presence of intraocular lens: Secondary | ICD-10-CM | POA: Diagnosis not present

## 2017-11-20 DIAGNOSIS — H353131 Nonexudative age-related macular degeneration, bilateral, early dry stage: Secondary | ICD-10-CM | POA: Diagnosis not present

## 2017-11-24 ENCOUNTER — Other Ambulatory Visit: Payer: Self-pay | Admitting: General Surgery

## 2017-11-24 DIAGNOSIS — K7689 Other specified diseases of liver: Secondary | ICD-10-CM | POA: Diagnosis not present

## 2017-11-27 NOTE — Pre-Procedure Instructions (Signed)
EVANI SHRIDER  11/27/2017      [facility], [place] - [address] AT [address] [place] [address] Phone: [phone] Fax: [phone]    Your procedure is scheduled on May 2  Report to [place] at 0930 A.M.  Call this number if you have problems the morning of surgery:  [phone]   Remember:  Do not eat food or drink liquids after midnight.  Continue all medications as directed by your physician except follow these medication instructions before surgery below  Please complete your PRE-SURGERY ENSURE that was given to before you leave your house the morning of surgery.  Please, if able, drink it in one setting. DO NOT SIP.   Take these medicines the morning of surgery with A SIP OF WATER  albuterol (PROVENTIL)  clonazePAM (KLONOPIN) methocarbamol (ROBAXIN) Eye drops if needed  7 days prior to surgery STOP taking any Aspirin(unless otherwise instructed by your surgeon), Aleve, Naproxen, Ibuprofen, Motrin, Advil, Goody's, BC's, all herbal medications, fish oil, and all vitamins  Follow your doctors instructions regarding your Aspirin.  If no instructions were given by your doctor, then you will need to call the prescribing office office to get instructions.      Do not wear jewelry, make-up or nail polish.  Do not wear lotions, powders, or perfumes, or deodorant.  Do not shave 48 hours prior to surgery.   Do not bring valuables to the hospital.  [facility] is not responsible for any belongings or valuables.  Contacts, dentures or bridgework may not be worn into surgery.  Leave your suitcase in the car.  After surgery it may be brought to your room.  For patients admitted to the hospital, discharge time will be determined by your treatment team.  Patients discharged the day of surgery will not be allowed to drive home.    Special instructions:   [place]- Preparing For  Surgery  Before surgery, you can play an important role. Because skin is not sterile, your skin needs to be as free of germs as possible. You can reduce the number of germs on your skin by washing with CHG (chlorahexidine gluconate) Soap before surgery.  CHG is an antiseptic cleaner which kills germs and bonds with the skin to continue killing germs even after washing.  Please do not use if you have an allergy to CHG or antibacterial soaps. If your skin becomes reddened/irritated stop using the CHG.  Do not shave (including legs and underarms) for at least 48 hours prior to first CHG shower. It is OK to shave your face.  Please follow these instructions carefully.   1. Shower the NIGHT BEFORE SURGERY and the MORNING OF SURGERY with CHG.   2. If you chose to wash your hair, wash your hair first as usual with your normal shampoo.  3. After you shampoo, rinse your hair and body thoroughly to remove the shampoo.  4. Use CHG as you would any other liquid soap. You can apply CHG directly to the skin and wash gently with a scrungie or a clean washcloth.   5. Apply the CHG Soap to your body ONLY FROM THE NECK DOWN.  Do not use on open wounds or open sores. Avoid contact with your eyes, ears, mouth and genitals (private parts). Wash Face and genitals (private parts)  with your normal soap.  6. Wash thoroughly, paying special attention to the  area where your surgery will be performed.  7. Thoroughly rinse your body with warm water from the neck down.  8. DO NOT shower/wash with your normal soap after using and rinsing off the CHG Soap.  9. Pat yourself dry with a CLEAN TOWEL.  10. Wear CLEAN PAJAMAS to bed the night before surgery, wear comfortable clothes the morning of surgery  11. Place CLEAN SHEETS on your bed the night of your first shower and DO NOT SLEEP WITH PETS.    Day of Surgery: Do not apply any deodorants/lotions. Please wear clean clothes to the hospital/surgery center.       Please read over the following fact sheets that you were given.

## 2017-11-28 ENCOUNTER — Encounter (HOSPITAL_COMMUNITY)
Admission: RE | Admit: 2017-11-28 | Discharge: 2017-11-28 | Disposition: A | Discharge status: Home / Self Care | Payer: Medicare Other | Source: Ambulatory Visit | Attending: General Surgery | Admitting: General Surgery

## 2017-11-28 ENCOUNTER — Other Ambulatory Visit: Payer: Self-pay

## 2017-11-28 ENCOUNTER — Encounter (HOSPITAL_COMMUNITY): Payer: Self-pay

## 2017-11-28 DIAGNOSIS — Z01812 Encounter for preprocedural laboratory examination: Secondary | ICD-10-CM | POA: Insufficient documentation

## 2017-11-28 LAB — CBC WITH DIFFERENTIAL/PLATELET
BASOS ABS: 0 10*3/uL (ref 0.0–0.1)
BASOS PCT: 1 %
EOS ABS: 0.3 10*3/uL (ref 0.0–0.7)
Eosinophils Relative: 4 %
HCT: 42 % (ref 36.0–46.0)
HEMOGLOBIN: 12.7 g/dL (ref 12.0–15.0)
Lymphocytes Relative: 24 %
Lymphs Abs: 1.8 10*3/uL (ref 0.7–4.0)
MCH: 29.4 pg (ref 26.0–34.0)
MCHC: 30.2 g/dL (ref 30.0–36.0)
MCV: 97.2 fL (ref 78.0–100.0)
MONO ABS: 0.9 10*3/uL (ref 0.1–1.0)
Monocytes Relative: 11 %
NEUTROS PCT: 60 %
Neutro Abs: 4.6 10*3/uL (ref 1.7–7.7)
Platelets: 246 10*3/uL (ref 150–400)
RBC: 4.32 MIL/uL (ref 3.87–5.11)
RDW: 14.6 % (ref 11.5–15.5)
WBC: 7.7 10*3/uL (ref 4.0–10.5)

## 2017-11-28 LAB — URINALYSIS, ROUTINE W REFLEX MICROSCOPIC
Bilirubin Urine: NEGATIVE
Glucose, UA: NEGATIVE mg/dL
Hgb urine dipstick: NEGATIVE
Ketones, ur: NEGATIVE mg/dL
Nitrite: NEGATIVE
PH: 5 (ref 5.0–8.0)
Protein, ur: NEGATIVE mg/dL
SPECIFIC GRAVITY, URINE: 1.018 (ref 1.005–1.030)
WBC, UA: >50 WBC/hpf — ABNORMAL HIGH (ref 0–5)

## 2017-11-28 LAB — COMPREHENSIVE METABOLIC PANEL
ALBUMIN: 3.7 g/dL (ref 3.5–5.0)
ALT: 17 U/L (ref 14–54)
ANION GAP: 12 (ref 5–15)
AST: 20 U/L (ref 15–41)
Alkaline Phosphatase: 137 U/L — ABNORMAL HIGH (ref 38–126)
BUN: 18 mg/dL (ref 6–20)
CO2: 27 mmol/L (ref 22–32)
Calcium: 9.6 mg/dL (ref 8.9–10.3)
Chloride: 101 mmol/L (ref 101–111)
Creatinine, Ser: 0.64 mg/dL (ref 0.44–1.00)
GFR calc Af Amer: >60 mL/min (ref >60)
GFR calc non Af Amer: >60 mL/min (ref >60)
Glucose, Bld: 93 mg/dL (ref 65–99)
POTASSIUM: 3.5 mmol/L (ref 3.5–5.1)
SODIUM: 140 mmol/L (ref 135–145)
Total Bilirubin: 0.8 mg/dL (ref 0.3–1.2)
Total Protein: 6.8 g/dL (ref 6.5–8.1)

## 2017-11-28 LAB — PROTIME-INR
INR: 1.18
Prothrombin Time: 14.9 seconds (ref 11.4–15.2)

## 2017-11-28 LAB — PREPARE RBC (CROSSMATCH)

## 2017-11-28 NOTE — Progress Notes (Signed)
PCP - Pricilla Holm Cardiologist - denies  Chest x-ray - not needed EKG - 10/15/17 Stress Test - denies ECHO - 2014 Cardiac Cathdenies -   Aspirin Instructions: last dose 11/23/17  Anesthesia review: NO  Patient denies shortness of breath, fever, cough and chest pain at PAT appointment   Patient verbalized understanding of instructions that were given to them at the PAT appointment. Patient was also instructed that they will need to review over the PAT instructions again at home before surgery.

## 2017-12-01 NOTE — H&P (Signed)
Lisa Hamilton Documented: 11/24/2017 10:15 AM Location: [facility] Patient #: [phone] DOB: May 19, 1946 Married / Language: Lisa Hamilton / Race: White Female   History of Present Illness Lisa Klein MD; 11/24/2017 11:19 AM) The patient is a 72 year old female who presents with non-malignant abdominal pain. Patient is a 72 year old female who presented to the hospital with a incarcerated umbilical hernia. Dr. Georgette Hamilton repaired this around 4-5 weeks ago. She had a bowel obstruction associated with this and required an NG tube. She is recovering quite well. As part of the imaging, she had a recurrent right hepatic cyst. I unroofed this back in 2012. Upon further questioning, she has been having some right upper quadrant pain and increased shortness of breath. She had attributed this to her kyphosis and scoliosis. She is "shrinking" and her torso is getting shorter. She also has been having some mild shortness of breath. She denies fevers, chills, jaundice. She has recovered quite well from her hernia repair. She denies pain or nausea and vomiting. She is no longer taking any pain medication from her surgery.  Ct abd/pelvis 10/15/2017 IMPRESSION: 1. Diffuse dilatation of small-bowel loops to 5.5 cm in diameter, with gradual decompression of the mid to distal ileum at the lower abdomen posterior to the small periumbilical hernia. Trace air noted within more distal small bowel loops. This may reflect partial small-bowel obstruction secondary to adhesion. 2. Small amount of ascites noted within the abdomen and pelvis, likely reflecting the small bowel process. 3. Diffuse soft tissue edema noted along the pannus. 4. Fluid filling the distal esophagus, likely reflecting gastroesophageal reflux. 5. Multiple large hepatic cysts again noted, measuring up to 16.4 cm. 6. Nonobstructing bilateral renal stones measure up to 8 mm in size. 7. Mild diverticulosis along the descending colon,  without evidence of diverticulitis. 8. Mild degenerative change along the lumbar spine.   Allergies Lisa Hamilton; 11/24/2017 10:15 AM) Penicillins  Rash. Codeine and Related  weird dreams Allergies Reconciled   Medication History Lisa Hamilton; 11/24/2017 10:15 AM) OxyCODONE HCl (5MG  Tablet, Oral) Active. ClonazePAM (0.5MG  Tablet, Oral) Active. TraMADol HCl (50MG  Tablet, Oral) Active. Betamethasone Dipropionate (0.05% Cream, External) Active. Gabapentin (300MG  Capsule, Oral) Active. Losartan Potassium (100MG  Tablet, Oral) Active. Meloxicam (15MG  Tablet, Oral) Active. Methocarbamol (500MG  Tablet, Oral) Active. Montelukast Sodium (10MG  Tablet, Oral) Active. Potassium Chloride Crys ER (20MEQ Tablet ER, Oral) Active. Pravastatin Sodium (40MG  Tablet, Oral) Active. Furosemide (40MG  Tablet, Oral) Active. ROPINIRole HCl (0.5MG  Tablet, Oral) Active. Theophylline ER (300MG  Tablet ER 12HR, Oral) Active. Mucinex (600MG  Tablet ER 12HR, Oral) Active. ICaps Areds 2 (Oral) Active. MiraLax (Oral) Active. Krill Oil (500MG  Capsule, Oral) Active. Co Q 10 (100MG  Capsule, Oral) Active. Aspirin (81MG  Capsule, Oral) Active. Medications Reconciled    Review of Systems Lisa Klein MD; 11/24/2017 11:19 AM) All other systems negative  Vitals Lisa Hamilton; 11/24/2017 10:17 AM) 11/24/2017 10:16 AM Weight: 210.38 lb Height: 60in Body Surface Area: 1.91 m Body Mass Index: 41.09 kg/m  Temp.: 59F(Oral)  Pulse: 79 (Regular)  BP: 124/80 (Sitting, Left Arm, Standard)       Physical Exam Lisa Klein MD; 11/24/2017 11:19 AM) General Mental Status-Alert. General Appearance-Consistent with stated age. Hydration-Well hydrated. Voice-Normal.  Head and Neck Head-normocephalic, atraumatic with no lesions or palpable masses.  Eye Sclera/Conjunctiva - Bilateral-No scleral icterus.  Chest and Lung Exam Chest and lung exam reveals -quiet, even  and easy respiratory effort with no use of accessory muscles. Inspection Chest Wall - Normal. Back - normal.  Breast -  Did not examine.  Cardiovascular Cardiovascular examination reveals -normal pedal pulses bilaterally. Note: regular rate and rhythm  Abdomen Inspection-Inspection Normal. Palpation/Percussion Palpation and Percussion of the abdomen reveal - Soft, Non Tender, No Rebound tenderness and No Rigidity (guarding). Note: fullness in the RUQ.  Peripheral Vascular Upper Extremity Inspection - Bilateral - Normal - No Clubbing, No Cyanosis, No Edema, Pulses Intact. Lower Extremity Palpation - Edema - Bilateral - No edema.  Neurologic Neurologic evaluation reveals -alert and oriented x 3 with no impairment of recent or remote memory. Mental Status-Normal.  Musculoskeletal Global Assessment -Note: no gross deformities.  Normal Exam - Left-Upper Extremity Strength Normal and Lower Extremity Strength Normal. Normal Exam - Right-Upper Extremity Strength Normal and Lower Extremity Strength Normal.  Lymphatic Head & Neck  General Head & Neck Lymphatics: Bilateral - Description - Normal. Axillary  General Axillary Region: Bilateral - Description - Normal. Tenderness - Non Tender.    Assessment & Plan Lisa Klein MD; 11/24/2017 11:22 AM) HEPATIC CYST (K76.89) Impression: Patient does have a very large right hepatic cyst. This is not as large as it was previously. I think this time I will have to excise the vast majority of the cyst wtih the adjacent liver. I think last time it fell back together and regrew. This time I will excise a much more significant portion of the liver in association with this. I discussed with the patient that the very central portion of the wall would remain, but that this would be much less likely to fold over on itself and recur. I do think because of her pain and shortness of breath, that we should treat this surgically. I do not  think aspiration or sclerosis is going to fix this. She additionally has a much smaller one on the left. If this is amenable to excision, I will excise this area as well.  I discussed that the main risks of this procedure are bleeding and bile leak. I will have to go closer to the right hepatic vein to staple off the portion of the liver posteriorly. I reviewed that there are all the other risks of surgery including pneumonia and urinary tract infection as well as blood clot. I do think that given the patient's age, she probably needs an additional 2-4 weeks to recover from her surgery. She states however that she is nearly back to baseline and would prefer to just get it over with. Current Plans Schedule for Surgery   Signed by Lisa Klein, MD (11/24/2017 11:23 AM)

## 2017-12-03 DIAGNOSIS — Z5189 Encounter for other specified aftercare: Secondary | ICD-10-CM

## 2017-12-03 HISTORY — DX: Encounter for other specified aftercare: Z51.89

## 2017-12-04 ENCOUNTER — Encounter (HOSPITAL_COMMUNITY): Payer: Self-pay

## 2017-12-04 ENCOUNTER — Inpatient Hospital Stay (HOSPITAL_COMMUNITY): Payer: Medicare Other | Admitting: Anesthesiology

## 2017-12-04 ENCOUNTER — Encounter (HOSPITAL_COMMUNITY)
Admission: RE | Disposition: A | Discharge status: Home Health | Payer: Self-pay | Source: Ambulatory Visit | Attending: General Surgery

## 2017-12-04 ENCOUNTER — Inpatient Hospital Stay (HOSPITAL_COMMUNITY)
Admission: RE | Admit: 2017-12-04 | Discharge: 2017-12-11 | DRG: 406 | Disposition: A | Discharge status: Home Health | Payer: Medicare Other | Source: Ambulatory Visit | Attending: General Surgery | Admitting: General Surgery

## 2017-12-04 ENCOUNTER — Inpatient Hospital Stay (HOSPITAL_COMMUNITY): Payer: Medicare Other

## 2017-12-04 DIAGNOSIS — K9161 Intraoperative hemorrhage and hematoma of a digestive system organ or structure complicating a digestive sytem procedure: Secondary | ICD-10-CM | POA: Diagnosis not present

## 2017-12-04 DIAGNOSIS — Z885 Allergy status to narcotic agent status: Secondary | ICD-10-CM

## 2017-12-04 DIAGNOSIS — D62 Acute posthemorrhagic anemia: Secondary | ICD-10-CM | POA: Diagnosis not present

## 2017-12-04 DIAGNOSIS — Z6841 Body Mass Index (BMI) 40.0 and over, adult: Secondary | ICD-10-CM | POA: Diagnosis not present

## 2017-12-04 DIAGNOSIS — K7689 Other specified diseases of liver: Secondary | ICD-10-CM | POA: Diagnosis present

## 2017-12-04 DIAGNOSIS — J4530 Mild persistent asthma, uncomplicated: Secondary | ICD-10-CM | POA: Diagnosis present

## 2017-12-04 DIAGNOSIS — Z79899 Other long term (current) drug therapy: Secondary | ICD-10-CM

## 2017-12-04 DIAGNOSIS — M1990 Unspecified osteoarthritis, unspecified site: Secondary | ICD-10-CM | POA: Diagnosis present

## 2017-12-04 DIAGNOSIS — Z880 Allergy status to penicillin: Secondary | ICD-10-CM

## 2017-12-04 DIAGNOSIS — I959 Hypotension, unspecified: Secondary | ICD-10-CM | POA: Diagnosis not present

## 2017-12-04 DIAGNOSIS — T501X5A Adverse effect of loop [high-ceiling] diuretics, initial encounter: Secondary | ICD-10-CM | POA: Diagnosis not present

## 2017-12-04 DIAGNOSIS — D649 Anemia, unspecified: Secondary | ICD-10-CM | POA: Diagnosis not present

## 2017-12-04 DIAGNOSIS — G2581 Restless legs syndrome: Secondary | ICD-10-CM | POA: Diagnosis present

## 2017-12-04 DIAGNOSIS — Y92234 Operating room of hospital as the place of occurrence of the external cause: Secondary | ICD-10-CM | POA: Diagnosis not present

## 2017-12-04 DIAGNOSIS — R402413 Glasgow coma scale score 13-15, at hospital admission: Secondary | ICD-10-CM | POA: Diagnosis present

## 2017-12-04 DIAGNOSIS — N179 Acute kidney failure, unspecified: Secondary | ICD-10-CM | POA: Diagnosis not present

## 2017-12-04 DIAGNOSIS — E669 Obesity, unspecified: Secondary | ICD-10-CM

## 2017-12-04 DIAGNOSIS — I10 Essential (primary) hypertension: Secondary | ICD-10-CM | POA: Diagnosis present

## 2017-12-04 DIAGNOSIS — Z9889 Other specified postprocedural states: Secondary | ICD-10-CM

## 2017-12-04 DIAGNOSIS — M419 Scoliosis, unspecified: Secondary | ICD-10-CM | POA: Diagnosis present

## 2017-12-04 DIAGNOSIS — G8929 Other chronic pain: Secondary | ICD-10-CM | POA: Diagnosis present

## 2017-12-04 DIAGNOSIS — G8918 Other acute postprocedural pain: Secondary | ICD-10-CM

## 2017-12-04 DIAGNOSIS — D72829 Elevated white blood cell count, unspecified: Secondary | ICD-10-CM | POA: Diagnosis not present

## 2017-12-04 DIAGNOSIS — J449 Chronic obstructive pulmonary disease, unspecified: Secondary | ICD-10-CM | POA: Diagnosis present

## 2017-12-04 DIAGNOSIS — R339 Retention of urine, unspecified: Secondary | ICD-10-CM | POA: Diagnosis present

## 2017-12-04 DIAGNOSIS — T402X5A Adverse effect of other opioids, initial encounter: Secondary | ICD-10-CM | POA: Diagnosis not present

## 2017-12-04 DIAGNOSIS — Z91048 Other nonmedicinal substance allergy status: Secondary | ICD-10-CM

## 2017-12-04 DIAGNOSIS — R5381 Other malaise: Secondary | ICD-10-CM | POA: Diagnosis present

## 2017-12-04 DIAGNOSIS — Y838 Other surgical procedures as the cause of abnormal reaction of the patient, or of later complication, without mention of misadventure at the time of the procedure: Secondary | ICD-10-CM | POA: Diagnosis not present

## 2017-12-04 DIAGNOSIS — E876 Hypokalemia: Secondary | ICD-10-CM | POA: Diagnosis present

## 2017-12-04 DIAGNOSIS — Y92230 Patient room in hospital as the place of occurrence of the external cause: Secondary | ICD-10-CM | POA: Diagnosis not present

## 2017-12-04 DIAGNOSIS — Z5331 Laparoscopic surgical procedure converted to open procedure: Secondary | ICD-10-CM

## 2017-12-04 DIAGNOSIS — R410 Disorientation, unspecified: Secondary | ICD-10-CM | POA: Diagnosis present

## 2017-12-04 DIAGNOSIS — Z7982 Long term (current) use of aspirin: Secondary | ICD-10-CM

## 2017-12-04 DIAGNOSIS — R079 Chest pain, unspecified: Secondary | ICD-10-CM | POA: Diagnosis not present

## 2017-12-04 DIAGNOSIS — Z791 Long term (current) use of non-steroidal anti-inflammatories (NSAID): Secondary | ICD-10-CM

## 2017-12-04 HISTORY — PX: LAPAROSCOPIC PARTIAL HEPATECTOMY: SHX5909

## 2017-12-04 HISTORY — DX: Partial intestinal obstruction, unspecified as to cause: K56.600

## 2017-12-04 LAB — BASIC METABOLIC PANEL
Anion gap: 5 (ref 5–15)
BUN: 11 mg/dL (ref 6–20)
CHLORIDE: 108 mmol/L (ref 101–111)
CO2: 28 mmol/L (ref 22–32)
CREATININE: 0.76 mg/dL (ref 0.44–1.00)
Calcium: 7.8 mg/dL — ABNORMAL LOW (ref 8.9–10.3)
Glucose, Bld: 213 mg/dL — ABNORMAL HIGH (ref 65–99)
Potassium: 4.1 mmol/L (ref 3.5–5.1)
SODIUM: 141 mmol/L (ref 135–145)

## 2017-12-04 LAB — CBC
HCT: 34.9 % — ABNORMAL LOW (ref 36.0–46.0)
HEMOGLOBIN: 10.6 g/dL — AB (ref 12.0–15.0)
MCH: 29.4 pg (ref 26.0–34.0)
MCHC: 30.4 g/dL (ref 30.0–36.0)
MCV: 96.7 fL (ref 78.0–100.0)
Platelets: 264 10*3/uL (ref 150–400)
RBC: 3.61 MIL/uL — AB (ref 3.87–5.11)
RDW: 15 % (ref 11.5–15.5)
WBC: 18.6 10*3/uL — ABNORMAL HIGH (ref 4.0–10.5)

## 2017-12-04 LAB — POCT I-STAT EG7
Bicarbonate: 29 mmol/L — ABNORMAL HIGH (ref 20.0–28.0)
CALCIUM ION: 1.18 mmol/L (ref 1.15–1.40)
HEMATOCRIT: 29 % — AB (ref 36.0–46.0)
HEMOGLOBIN: 9.9 g/dL — AB (ref 12.0–15.0)
O2 Saturation: 98 %
POTASSIUM: 3.9 mmol/L (ref 3.5–5.1)
Patient temperature: 35.6
Sodium: 142 mmol/L (ref 135–145)
TCO2: 31 mmol/L (ref 22–32)
pCO2, Ven: 67.6 mmHg — ABNORMAL HIGH (ref 44.0–60.0)
pH, Ven: 7.233 — ABNORMAL LOW (ref 7.250–7.430)
pO2, Ven: 113 mmHg — ABNORMAL HIGH (ref 32.0–45.0)

## 2017-12-04 LAB — MRSA PCR SCREENING: MRSA BY PCR: NEGATIVE

## 2017-12-04 LAB — PREPARE RBC (CROSSMATCH)

## 2017-12-04 SURGERY — HEPATECTOMY, PARTIAL, LAPAROSCOPIC
Anesthesia: General | Site: Abdomen

## 2017-12-04 MED ORDER — PROPOFOL 10 MG/ML IV BOLUS
INTRAVENOUS | Status: DC | PRN
  Administered 2017-12-04: 150 mg via INTRAVENOUS

## 2017-12-04 MED ORDER — ACETAMINOPHEN 500 MG PO TABS
1000.0000 mg | ORAL_TABLET | ORAL | Status: AC
  Administered 2017-12-04: 1000 mg via ORAL
  Filled 2017-12-04: qty 2

## 2017-12-04 MED ORDER — MEPERIDINE HCL 50 MG/ML IJ SOLN: 6.2500 mg | INTRAMUSCULAR | Status: DC | PRN

## 2017-12-04 MED ORDER — FUROSEMIDE 40 MG PO TABS
40.0000 mg | ORAL_TABLET | Freq: Three times a day (TID) | ORAL | Status: DC
  Administered 2017-12-05 (×3): 40 mg via ORAL
  Filled 2017-12-04 (×4): qty 1

## 2017-12-04 MED ORDER — TRIAMCINOLONE ACETONIDE 0.5 % EX CREA: TOPICAL_CREAM | Freq: Two times a day (BID) | CUTANEOUS | Status: DC

## 2017-12-04 MED ORDER — LACTATED RINGERS IV SOLN
INTRAVENOUS | Status: DC
  Administered 2017-12-04 (×2): via INTRAVENOUS

## 2017-12-04 MED ORDER — HYDROMORPHONE HCL 1 MG/ML IJ SOLN: 0.5000 mg | INTRAMUSCULAR | Status: DC | PRN

## 2017-12-04 MED ORDER — MONTELUKAST SODIUM 10 MG PO TABS
10.0000 mg | ORAL_TABLET | Freq: Every evening | ORAL | Status: DC
  Administered 2017-12-05 – 2017-12-10 (×6): 10 mg via ORAL
  Filled 2017-12-04 (×7): qty 1

## 2017-12-04 MED ORDER — OXYCODONE HCL 5 MG PO TABS: 5.0000 mg | ORAL_TABLET | Freq: Four times a day (QID) | ORAL | Status: DC | PRN

## 2017-12-04 MED ORDER — ACETAMINOPHEN 500 MG PO TABS: 1000.0000 mg | ORAL_TABLET | Freq: Three times a day (TID) | ORAL | Status: DC | PRN

## 2017-12-04 MED ORDER — GUAIFENESIN ER 600 MG PO TB12
600.0000 mg | ORAL_TABLET | Freq: Two times a day (BID) | ORAL | Status: DC | PRN
  Filled 2017-12-04: qty 1

## 2017-12-04 MED ORDER — SIMETHICONE 80 MG PO CHEW
40.0000 mg | CHEWABLE_TABLET | Freq: Four times a day (QID) | ORAL | Status: DC | PRN
  Filled 2017-12-04: qty 1

## 2017-12-04 MED ORDER — POTASSIUM CHLORIDE CRYS ER 20 MEQ PO TBCR
20.0000 meq | EXTENDED_RELEASE_TABLET | Freq: Two times a day (BID) | ORAL | Status: DC
  Administered 2017-12-05 – 2017-12-06 (×2): 20 meq via ORAL
  Filled 2017-12-04 (×5): qty 1

## 2017-12-04 MED ORDER — GABAPENTIN 300 MG PO CAPS
300.0000 mg | ORAL_CAPSULE | Freq: Three times a day (TID) | ORAL | Status: DC
  Administered 2017-12-05 – 2017-12-11 (×18): 300 mg via ORAL
  Filled 2017-12-04 (×21): qty 1

## 2017-12-04 MED ORDER — ONDANSETRON HCL 4 MG/2ML IJ SOLN
INTRAMUSCULAR | Status: DC | PRN
  Administered 2017-12-04: 4 mg via INTRAVENOUS

## 2017-12-04 MED ORDER — DEXAMETHASONE SODIUM PHOSPHATE 10 MG/ML IJ SOLN
INTRAMUSCULAR | Status: AC
  Filled 2017-12-04: qty 1

## 2017-12-04 MED ORDER — BUPIVACAINE ON-Q PAIN PUMP (FOR ORDER SET NO CHG)
INJECTION | Status: AC
  Filled 2017-12-04: qty 1

## 2017-12-04 MED ORDER — MORPHINE SULFATE 2 MG/ML IV SOLN
INTRAVENOUS | Status: DC
  Filled 2017-12-04: qty 25

## 2017-12-04 MED ORDER — CLONAZEPAM 0.5 MG PO TABS: 0.2500 mg | ORAL_TABLET | ORAL | Status: DC

## 2017-12-04 MED ORDER — HEMOSTATIC AGENTS (NO CHARGE) OPTIME
TOPICAL | Status: DC | PRN
  Administered 2017-12-04: 2 via TOPICAL
  Administered 2017-12-04: 1 via TOPICAL

## 2017-12-04 MED ORDER — DEXTROSE IN LACTATED RINGERS 5 % IV SOLN
INTRAVENOUS | Status: DC
  Administered 2017-12-04 – 2017-12-07 (×3): via INTRAVENOUS

## 2017-12-04 MED ORDER — 0.9 % SODIUM CHLORIDE (POUR BTL) OPTIME
TOPICAL | Status: DC | PRN
  Administered 2017-12-04 (×3): 1000 mL

## 2017-12-04 MED ORDER — OXYCODONE HCL 5 MG PO TABS: 5.0000 mg | ORAL_TABLET | Freq: Once | ORAL | Status: DC | PRN

## 2017-12-04 MED ORDER — SODIUM CHLORIDE 0.9 % IV SOLN: 0.0000 ug/min | INTRAVENOUS | Status: DC

## 2017-12-04 MED ORDER — DEXAMETHASONE SODIUM PHOSPHATE 4 MG/ML IJ SOLN
INTRAMUSCULAR | Status: DC | PRN
  Administered 2017-12-04: 5 mg via INTRAVENOUS

## 2017-12-04 MED ORDER — HYDRALAZINE HCL 20 MG/ML IJ SOLN
10.0000 mg | INTRAMUSCULAR | Status: DC | PRN
  Filled 2017-12-04: qty 1

## 2017-12-04 MED ORDER — LIDOCAINE HCL (PF) 1 % IJ SOLN
INTRAMUSCULAR | Status: AC
  Filled 2017-12-04: qty 30

## 2017-12-04 MED ORDER — SODIUM CHLORIDE 0.9 % IR SOLN
Status: DC | PRN
  Administered 2017-12-04: 1000 mL

## 2017-12-04 MED ORDER — ONDANSETRON HCL 4 MG/2ML IJ SOLN
INTRAMUSCULAR | Status: AC
  Filled 2017-12-04: qty 2

## 2017-12-04 MED ORDER — ALBUTEROL SULFATE (2.5 MG/3ML) 0.083% IN NEBU
2.5000 mg | INHALATION_SOLUTION | Freq: Four times a day (QID) | RESPIRATORY_TRACT | Status: DC | PRN
  Administered 2017-12-05 – 2017-12-09 (×3): 2.5 mg via RESPIRATORY_TRACT
  Filled 2017-12-04 (×3): qty 3

## 2017-12-04 MED ORDER — LIDOCAINE 2% (20 MG/ML) 5 ML SYRINGE
INTRAMUSCULAR | Status: AC
  Filled 2017-12-04: qty 5

## 2017-12-04 MED ORDER — FENTANYL CITRATE (PF) 250 MCG/5ML IJ SOLN
INTRAMUSCULAR | Status: AC
  Filled 2017-12-04: qty 5

## 2017-12-04 MED ORDER — DOCUSATE SODIUM 100 MG PO CAPS
100.0000 mg | ORAL_CAPSULE | Freq: Two times a day (BID) | ORAL | Status: DC
  Administered 2017-12-05 (×2): 100 mg via ORAL
  Filled 2017-12-04 (×4): qty 1

## 2017-12-04 MED ORDER — BUPIVACAINE 0.25 % ON-Q PUMP DUAL CATH 300 ML
300.0000 mL | INJECTION | Status: DC
Start: 2017-12-04 — End: 2017-12-11
  Filled 2017-12-04: qty 300

## 2017-12-04 MED ORDER — DIPHENHYDRAMINE HCL 50 MG/ML IJ SOLN: 12.5000 mg | Freq: Four times a day (QID) | INTRAMUSCULAR | Status: DC | PRN

## 2017-12-04 MED ORDER — MIDAZOLAM HCL 2 MG/2ML IJ SOLN
INTRAMUSCULAR | Status: AC
  Filled 2017-12-04: qty 2

## 2017-12-04 MED ORDER — ONDANSETRON HCL 4 MG/2ML IJ SOLN: 4.0000 mg | Freq: Four times a day (QID) | INTRAMUSCULAR | Status: DC | PRN

## 2017-12-04 MED ORDER — EPHEDRINE 5 MG/ML INJ
INTRAVENOUS | Status: AC
  Filled 2017-12-04: qty 10

## 2017-12-04 MED ORDER — HYPROMELLOSE (GONIOSCOPIC) 2.5 % OP SOLN
1.0000 [drp] | Freq: Every day | OPHTHALMIC | Status: DC | PRN
  Filled 2017-12-04: qty 15

## 2017-12-04 MED ORDER — OXYCODONE HCL 5 MG/5ML PO SOLN: 5.0000 mg | Freq: Once | ORAL | Status: DC | PRN

## 2017-12-04 MED ORDER — SODIUM CHLORIDE 0.9 % IV SOLN
Freq: Once | INTRAVENOUS | Status: AC
  Administered 2017-12-04: 14:00:00 via INTRAVENOUS

## 2017-12-04 MED ORDER — LACTATED RINGERS IV SOLN
INTRAVENOUS | Status: DC | PRN
  Administered 2017-12-04: 13:00:00 via INTRAVENOUS

## 2017-12-04 MED ORDER — LIDOCAINE HCL 1 % IJ SOLN
INTRAMUSCULAR | Status: DC | PRN
  Administered 2017-12-04: 23 mL via INTRAMUSCULAR

## 2017-12-04 MED ORDER — CIPROFLOXACIN IN D5W 400 MG/200ML IV SOLN
400.0000 mg | INTRAVENOUS | Status: AC
  Administered 2017-12-04: 400 mg via INTRAVENOUS
  Filled 2017-12-04: qty 200

## 2017-12-04 MED ORDER — PROPYLENE GLYCOL 0.6 % OP SOLN: Freq: Every day | OPHTHALMIC | Status: DC | PRN

## 2017-12-04 MED ORDER — BUPIVACAINE-EPINEPHRINE (PF) 0.25% -1:200000 IJ SOLN
INTRAMUSCULAR | Status: AC
  Filled 2017-12-04: qty 30

## 2017-12-04 MED ORDER — CLONAZEPAM 0.25 MG PO TBDP
0.2500 mg | ORAL_TABLET | Freq: Every day | ORAL | Status: DC | PRN
  Administered 2017-12-07: 0.25 mg via ORAL
  Filled 2017-12-04: qty 1

## 2017-12-04 MED ORDER — ROPINIROLE HCL 0.5 MG PO TABS: 0.5000 mg | ORAL_TABLET | ORAL | Status: DC

## 2017-12-04 MED ORDER — SUGAMMADEX SODIUM 200 MG/2ML IV SOLN
INTRAVENOUS | Status: DC | PRN
  Administered 2017-12-04: 200 mg via INTRAVENOUS

## 2017-12-04 MED ORDER — MIDAZOLAM HCL 5 MG/5ML IJ SOLN
INTRAMUSCULAR | Status: DC | PRN
  Administered 2017-12-04: 1 mg via INTRAVENOUS

## 2017-12-04 MED ORDER — SODIUM CHLORIDE 0.9 % IV SOLN
0.0000 ug/min | INTRAVENOUS | Status: DC
  Administered 2017-12-04: 35 ug/min via INTRAVENOUS
  Administered 2017-12-04: 50 ug/min via INTRAVENOUS
  Administered 2017-12-05: 65 ug/min via INTRAVENOUS
  Administered 2017-12-05: 82 ug/min via INTRAVENOUS
  Administered 2017-12-05: 83 ug/min via INTRAVENOUS
  Administered 2017-12-05: 75 ug/min via INTRAVENOUS
  Administered 2017-12-05: 85 ug/min via INTRAVENOUS
  Administered 2017-12-05: 82 ug/min via INTRAVENOUS
  Administered 2017-12-05: 75 ug/min via INTRAVENOUS
  Administered 2017-12-05: 65 ug/min via INTRAVENOUS
  Administered 2017-12-05: 85 ug/min via INTRAVENOUS
  Administered 2017-12-06: 87 ug/min via INTRAVENOUS
  Filled 2017-12-04: qty 1
  Filled 2017-12-04: qty 10
  Filled 2017-12-04: qty 1
  Filled 2017-12-04 (×5): qty 10
  Filled 2017-12-04: qty 1
  Filled 2017-12-04: qty 10
  Filled 2017-12-04 (×2): qty 1

## 2017-12-04 MED ORDER — CLONAZEPAM 0.5 MG PO TABS
0.5000 mg | ORAL_TABLET | Freq: Every day | ORAL | Status: DC
  Administered 2017-12-05 – 2017-12-10 (×6): 0.5 mg via ORAL
  Filled 2017-12-04 (×6): qty 1

## 2017-12-04 MED ORDER — ROPINIROLE HCL 0.5 MG PO TABS
0.5000 mg | ORAL_TABLET | Freq: Three times a day (TID) | ORAL | Status: DC
  Administered 2017-12-05 – 2017-12-11 (×18): 0.5 mg via ORAL
  Filled 2017-12-04 (×19): qty 1

## 2017-12-04 MED ORDER — ARTIFICIAL TEARS OPHTHALMIC OINT
TOPICAL_OINTMENT | OPHTHALMIC | Status: AC
  Filled 2017-12-04: qty 7

## 2017-12-04 MED ORDER — POLYETHYLENE GLYCOL 3350 17 G PO PACK
17.0000 g | PACK | Freq: Three times a day (TID) | ORAL | Status: DC | PRN
  Filled 2017-12-04: qty 1

## 2017-12-04 MED ORDER — EVICEL 2 ML EX KIT
PACK | CUTANEOUS | Status: AC
  Filled 2017-12-04: qty 1

## 2017-12-04 MED ORDER — FENTANYL CITRATE (PF) 100 MCG/2ML IJ SOLN
INTRAMUSCULAR | Status: DC | PRN
  Administered 2017-12-04: 50 ug via INTRAVENOUS
  Administered 2017-12-04: 25 ug via INTRAVENOUS
  Administered 2017-12-04: 50 ug via INTRAVENOUS
  Administered 2017-12-04 (×3): 25 ug via INTRAVENOUS
  Administered 2017-12-04: 50 ug via INTRAVENOUS

## 2017-12-04 MED ORDER — ROPINIROLE HCL 1 MG PO TABS
1.0000 mg | ORAL_TABLET | Freq: Every day | ORAL | Status: DC
  Administered 2017-12-05 – 2017-12-10 (×5): 1 mg via ORAL
  Filled 2017-12-04 (×8): qty 1

## 2017-12-04 MED ORDER — CHLORHEXIDINE GLUCONATE CLOTH 2 % EX PADS: 6.0000 | MEDICATED_PAD | Freq: Once | CUTANEOUS | Status: DC

## 2017-12-04 MED ORDER — ALBUMIN HUMAN 5 % IV SOLN
INTRAVENOUS | Status: DC | PRN
  Administered 2017-12-04: 13:00:00 via INTRAVENOUS

## 2017-12-04 MED ORDER — CLONAZEPAM 0.25 MG PO TBDP
0.2500 mg | ORAL_TABLET | Freq: Every day | ORAL | Status: DC
  Administered 2017-12-07 – 2017-12-10 (×4): 0.25 mg via ORAL
  Filled 2017-12-04 (×3): qty 1
  Filled 2017-12-04: qty 2
  Filled 2017-12-04: qty 1

## 2017-12-04 MED ORDER — CIPROFLOXACIN IN D5W 400 MG/200ML IV SOLN
400.0000 mg | Freq: Two times a day (BID) | INTRAVENOUS | Status: AC
  Administered 2017-12-04: 400 mg via INTRAVENOUS
  Filled 2017-12-04: qty 200

## 2017-12-04 MED ORDER — NALOXONE HCL 0.4 MG/ML IJ SOLN: 0.4000 mg | INTRAMUSCULAR | Status: DC | PRN

## 2017-12-04 MED ORDER — PROMETHAZINE HCL 25 MG/ML IJ SOLN: 6.2500 mg | INTRAMUSCULAR | Status: DC | PRN

## 2017-12-04 MED ORDER — DIPHENHYDRAMINE HCL 12.5 MG/5ML PO ELIX
12.5000 mg | ORAL_SOLUTION | Freq: Four times a day (QID) | ORAL | Status: DC | PRN
  Filled 2017-12-04: qty 5

## 2017-12-04 MED ORDER — METHOCARBAMOL 500 MG PO TABS
500.0000 mg | ORAL_TABLET | Freq: Three times a day (TID) | ORAL | Status: DC | PRN
  Filled 2017-12-04: qty 1

## 2017-12-04 MED ORDER — LIDOCAINE 2% (20 MG/ML) 5 ML SYRINGE
INTRAMUSCULAR | Status: DC | PRN
  Administered 2017-12-04: 60 mg via INTRAVENOUS

## 2017-12-04 MED ORDER — SUGAMMADEX SODIUM 200 MG/2ML IV SOLN
INTRAVENOUS | Status: AC
  Filled 2017-12-04: qty 2

## 2017-12-04 MED ORDER — ONDANSETRON 4 MG PO TBDP
4.0000 mg | ORAL_TABLET | Freq: Four times a day (QID) | ORAL | Status: DC | PRN
  Filled 2017-12-04: qty 1

## 2017-12-04 MED ORDER — DIPHENHYDRAMINE HCL 12.5 MG/5ML PO ELIX: 12.5000 mg | ORAL_SOLUTION | Freq: Four times a day (QID) | ORAL | Status: DC | PRN

## 2017-12-04 MED ORDER — HYDROMORPHONE HCL 2 MG/ML IJ SOLN
INTRAMUSCULAR | Status: AC
  Filled 2017-12-04: qty 1

## 2017-12-04 MED ORDER — FENTANYL CITRATE (PF) 100 MCG/2ML IJ SOLN
INTRAMUSCULAR | Status: AC
  Filled 2017-12-04: qty 2

## 2017-12-04 MED ORDER — ROCURONIUM BROMIDE 100 MG/10ML IV SOLN
INTRAVENOUS | Status: DC | PRN
  Administered 2017-12-04: 40 mg via INTRAVENOUS
  Administered 2017-12-04: 20 mg via INTRAVENOUS
  Administered 2017-12-04 (×2): 10 mg via INTRAVENOUS

## 2017-12-04 MED ORDER — HYDROMORPHONE HCL 2 MG/ML IJ SOLN
0.2500 mg | INTRAMUSCULAR | Status: DC | PRN
  Administered 2017-12-04: 0.5 mg via INTRAVENOUS

## 2017-12-04 MED ORDER — HYDROMORPHONE HCL 1 MG/ML IJ SOLN
0.5000 mg | INTRAMUSCULAR | Status: DC | PRN
  Administered 2017-12-04: 1 mg via INTRAVENOUS
  Administered 2017-12-04 – 2017-12-05 (×3): 0.5 mg via INTRAVENOUS
  Filled 2017-12-04 (×3): qty 0.5
  Filled 2017-12-04: qty 1

## 2017-12-04 MED ORDER — SODIUM CHLORIDE 0.9% FLUSH: 9.0000 mL | INTRAVENOUS | Status: DC | PRN

## 2017-12-04 MED ORDER — PHENYLEPHRINE HCL 10 MG/ML IJ SOLN
INTRAVENOUS | Status: DC | PRN
  Administered 2017-12-04: 100 ug/min via INTRAVENOUS

## 2017-12-04 MED ORDER — TRAMADOL HCL 50 MG PO TABS
50.0000 mg | ORAL_TABLET | Freq: Four times a day (QID) | ORAL | Status: DC | PRN
  Administered 2017-12-05: 50 mg via ORAL
  Filled 2017-12-04: qty 1

## 2017-12-04 MED ORDER — GABAPENTIN 300 MG PO CAPS
300.0000 mg | ORAL_CAPSULE | ORAL | Status: AC
  Administered 2017-12-04: 300 mg via ORAL
  Filled 2017-12-04: qty 1

## 2017-12-04 MED ORDER — PHENYLEPHRINE 40 MCG/ML (10ML) SYRINGE FOR IV PUSH (FOR BLOOD PRESSURE SUPPORT)
PREFILLED_SYRINGE | INTRAVENOUS | Status: DC | PRN
  Administered 2017-12-04: 120 ug via INTRAVENOUS

## 2017-12-04 MED ORDER — EPHEDRINE SULFATE-NACL 50-0.9 MG/10ML-% IV SOSY
PREFILLED_SYRINGE | INTRAVENOUS | Status: DC | PRN
  Administered 2017-12-04: 10 mg via INTRAVENOUS
  Administered 2017-12-04: 5 mg via INTRAVENOUS

## 2017-12-04 SURGICAL SUPPLY — 91 items
APL SRG 32X5 SNPLK LF DISP (MISCELLANEOUS)
BAG SPEC RTRVL LRG 6X4 10 (ENDOMECHANICALS) ×1
BIOPATCH BLUE 3/4IN DISK W/1.5 (GAUZE/BANDAGES/DRESSINGS) ×2 IMPLANT
CANISTER SUCT 3000ML PPV (MISCELLANEOUS) ×4 IMPLANT
CATH KIT ON-Q SILVERSOAK 7.5IN (CATHETERS) ×4 IMPLANT
CATH ROBINSON RED A/P 12FR (CATHETERS) ×2 IMPLANT
CHLORAPREP W/TINT 26ML (MISCELLANEOUS) ×2 IMPLANT
CLIP VESOCCLUDE LG 6/CT (CLIP) ×2 IMPLANT
COVER SURGICAL LIGHT HANDLE (MISCELLANEOUS) ×2 IMPLANT
DERMABOND ADVANCED (GAUZE/BANDAGES/DRESSINGS) ×1
DERMABOND ADVANCED .7 DNX12 (GAUZE/BANDAGES/DRESSINGS) ×1 IMPLANT
DRAIN CHANNEL 19F RND (DRAIN) ×2 IMPLANT
DRAPE UTILITY XL STRL (DRAPES) ×4 IMPLANT
DRAPE WARM FLUID 44X44 (DRAPE) ×2 IMPLANT
DRSG COVADERM 4X10 (GAUZE/BANDAGES/DRESSINGS) IMPLANT
DRSG COVADERM 4X14 (GAUZE/BANDAGES/DRESSINGS) IMPLANT
DRSG TEGADERM 4X4.75 (GAUZE/BANDAGES/DRESSINGS) ×2 IMPLANT
ELECT BLADE 4.0 EZ CLEAN MEGAD (MISCELLANEOUS) ×2
ELECT BLADE 6.5 EXT (BLADE) IMPLANT
ELECT CAUTERY BLADE 6.4 (BLADE) ×2 IMPLANT
ELECT REM PT RETURN 9FT ADLT (ELECTROSURGICAL) ×2
ELECTRODE BLDE 4.0 EZ CLN MEGD (MISCELLANEOUS) ×1 IMPLANT
ELECTRODE REM PT RTRN 9FT ADLT (ELECTROSURGICAL) ×1 IMPLANT
EVACUATOR SILICONE 100CC (DRAIN) ×2 IMPLANT
GAUZE SPONGE 4X4 12PLY STRL (GAUZE/BANDAGES/DRESSINGS) ×2 IMPLANT
GLOVE BIO SURGEON STRL SZ 6 (GLOVE) ×2 IMPLANT
GLOVE BIOGEL PI IND STRL 7.0 (GLOVE) ×1 IMPLANT
GLOVE BIOGEL PI IND STRL 7.5 (GLOVE) ×1 IMPLANT
GLOVE BIOGEL PI INDICATOR 7.0 (GLOVE) ×1
GLOVE BIOGEL PI INDICATOR 7.5 (GLOVE) ×1
GLOVE ECLIPSE 7.5 STRL STRAW (GLOVE) ×2 IMPLANT
GLOVE INDICATOR 6.5 STRL GRN (GLOVE) ×2 IMPLANT
GOWN STRL REUS W/ TWL LRG LVL3 (GOWN DISPOSABLE) IMPLANT
GOWN STRL REUS W/TWL 2XL LVL3 (GOWN DISPOSABLE) ×2 IMPLANT
GOWN STRL REUS W/TWL LRG LVL3 (GOWN DISPOSABLE)
HAND PENCIL TRP OPTION (MISCELLANEOUS) IMPLANT
HEMOSTAT ARISTA ABSORB 3G PWDR (MISCELLANEOUS) ×2 IMPLANT
HEMOSTAT SNOW SURGICEL 2X4 (HEMOSTASIS) ×4 IMPLANT
KIT BASIN OR (CUSTOM PROCEDURE TRAY) ×2 IMPLANT
KIT TURNOVER KIT B (KITS) ×2 IMPLANT
L-HOOK LAP DISP 36CM (ELECTROSURGICAL) ×2
LHOOK LAP DISP 36CM (ELECTROSURGICAL) ×1 IMPLANT
NS IRRIG 1000ML POUR BTL (IV SOLUTION) ×4 IMPLANT
PAD ARMBOARD 7.5X6 YLW CONV (MISCELLANEOUS) ×4 IMPLANT
PENCIL BUTTON HOLSTER BLD 10FT (ELECTRODE) ×2 IMPLANT
POUCH SPECIMEN RETRIEVAL 10MM (ENDOMECHANICALS) ×2 IMPLANT
RELOAD STAPLER BLUE 60MM (STAPLE) ×9 IMPLANT
RELOAD STAPLER WHITE 60MM (STAPLE) ×10 IMPLANT
SCISSORS ENDO CVD 5DCS (MISCELLANEOUS) ×2 IMPLANT
SEALANT SURGICAL APPL DUAL CAN (MISCELLANEOUS) IMPLANT
SET IRRIG TUBING LAPAROSCOPIC (IRRIGATION / IRRIGATOR) IMPLANT
SHEARS HARMONIC ACE PLUS 36CM (ENDOMECHANICALS) IMPLANT
SLEEVE ENDOPATH XCEL 5M (ENDOMECHANICALS) ×8 IMPLANT
SLEEVE SURGEON STRL (DRAPES) ×2 IMPLANT
SPONGE LAP 18X18 5 PK (GAUZE/BANDAGES/DRESSINGS) ×8 IMPLANT
STAPLE ECHEON FLEX 60 POW ENDO (STAPLE) ×4 IMPLANT
STAPLER RELOAD BLUE 60MM (STAPLE) ×18
STAPLER RELOAD WHITE 60MM (STAPLE) ×20
STAPLER VISISTAT 35W (STAPLE) ×2 IMPLANT
SUT ETHILON 2 0 FS 18 (SUTURE) ×4 IMPLANT
SUT MNCRL AB 4-0 PS2 18 (SUTURE) IMPLANT
SUT PDS AB 1 TP1 96 (SUTURE) ×8 IMPLANT
SUT PDS II 0 TP-1 LOOPED 60 (SUTURE) IMPLANT
SUT PROLENE 1 CT (SUTURE) ×4 IMPLANT
SUT PROLENE 3 0 SH 48 (SUTURE) ×4 IMPLANT
SUT PROLENE 3 0 SH DA (SUTURE) ×2 IMPLANT
SUT PROLENE 4 0 RB 1 (SUTURE) ×4
SUT PROLENE 4-0 RB1 .5 CRCL 36 (SUTURE) ×2 IMPLANT
SUT VIC AB 2-0 CT1 36 (SUTURE) ×4 IMPLANT
SUT VIC AB 2-0 SH 18 (SUTURE) ×6 IMPLANT
SUT VIC AB 3-0 54X BRD REEL (SUTURE) ×1 IMPLANT
SUT VIC AB 3-0 BRD 54 (SUTURE) ×1
SUT VIC AB 3-0 SH 18 (SUTURE) ×2 IMPLANT
SUT VIC AB 3-0 SH 8-18 (SUTURE) ×2 IMPLANT
SUT VICRYL AB 2 0 TIES (SUTURE) ×2 IMPLANT
SUT VICRYL AB 3 0 TIES (SUTURE) ×2 IMPLANT
SYS LAPSCP GELPORT 120MM (MISCELLANEOUS) ×2
SYSTEM LAPSCP GELPORT 120MM (MISCELLANEOUS) ×1 IMPLANT
TOWEL OR 17X24 6PK STRL BLUE (TOWEL DISPOSABLE) ×2 IMPLANT
TRAY FOLEY MTR SLVR 14FR STAT (SET/KITS/TRAYS/PACK) ×2 IMPLANT
TRAY LAPAROSCOPIC MC (CUSTOM PROCEDURE TRAY) ×2 IMPLANT
TROCAR XCEL 12X100 BLDLESS (ENDOMECHANICALS) IMPLANT
TROCAR XCEL BLUNT TIP 100MML (ENDOMECHANICALS) ×2 IMPLANT
TROCAR XCEL NON-BLD 5MMX100MML (ENDOMECHANICALS) IMPLANT
TUBE CONNECTING 12X1/4 (SUCTIONS) ×4 IMPLANT
TUBING INSUF HEATED (TUBING) IMPLANT
TUBING INSUFFLATION (TUBING) ×2 IMPLANT
TUNNELER SHEATH ON-Q 11GX8 (MISCELLANEOUS) ×2 IMPLANT
TUNNELER SHEATH ON-Q 16GX12 DP (PAIN MANAGEMENT) ×2 IMPLANT
WATER STERILE IRR 1000ML POUR (IV SOLUTION) ×2 IMPLANT
YANKAUER SUCT BULB TIP NO VENT (SUCTIONS) ×2 IMPLANT

## 2017-12-04 NOTE — Anesthesia Preprocedure Evaluation (Addendum)
Anesthesia Evaluation  Patient identified by MRN, date of birth, ID band Patient awake    Reviewed: Allergy & Precautions, NPO status , Patient's Chart, lab work & pertinent test results  Airway Mallampati: II  TM Distance: <3 FB Neck ROM: Full    Dental no notable dental hx.    Pulmonary asthma , COPD,    Pulmonary exam normal breath sounds clear to auscultation       Cardiovascular hypertension, Pt. on medications Normal cardiovascular exam Rhythm:Regular Rate:Normal     Neuro/Psych negative neurological ROS  negative psych ROS   GI/Hepatic negative GI ROS, Neg liver ROS, hiatal hernia,   Endo/Other  Morbid obesity  Renal/GU negative Renal ROS  negative genitourinary   Musculoskeletal negative musculoskeletal ROS (+) Arthritis , Osteoarthritis,    Abdominal   Peds negative pediatric ROS (+)  Hematology negative hematology ROS (+)   Anesthesia Other Findings   Reproductive/Obstetrics negative OB ROS                             Anesthesia Physical  Anesthesia Plan  ASA: III  Anesthesia Plan: General   Post-op Pain Management:    Induction: Intravenous  PONV Risk Score and Plan: 3 and Ondansetron, Dexamethasone, Treatment may vary due to age or medical condition and Midazolam  Airway Management Planned: Oral ETT  Additional Equipment:   Intra-op Plan:   Post-operative Plan: Extubation in OR  Informed Consent: I have reviewed the patients History and Physical, chart, labs and discussed the procedure including the risks, benefits and alternatives for the proposed anesthesia with the patient or authorized representative who has indicated his/her understanding and acceptance.   Dental advisory given  Plan Discussed with: CRNA and Surgeon  Anesthesia Plan Comments:        Anesthesia Quick Evaluation

## 2017-12-04 NOTE — Transfer of Care (Signed)
Immediate Anesthesia Transfer of Care Note  Patient: Lisa Hamilton  Procedure(s) Performed: LAPAROSCOPIC PARTIAL HEPATECTOMY (N/A Abdomen)  Patient Location: PACU  Anesthesia Type:General  Level of Consciousness: awake and alert   Airway & Oxygen Therapy: Patient Spontanous Breathing and Patient connected to face mask oxygen  Post-op Assessment: Report given to RN and Post -op Vital signs reviewed and stable  Post vital signs: Reviewed and stable  Last Vitals:  Vitals Value Taken Time  BP 175/151 12/04/2017  3:16 PM  Temp 36.3 C 12/04/2017  3:15 PM  Pulse 92 12/04/2017  3:21 PM  Resp 25 12/04/2017  3:21 PM  SpO2 97 % 12/04/2017  3:21 PM  Vitals shown include unvalidated device data.  Last Pain:  Vitals:   12/04/17 0854  TempSrc:   PainSc: 0-No pain         Complications: No apparent anesthesia complications

## 2017-12-04 NOTE — Interval H&P Note (Signed)
History and Physical Interval Note:  12/04/2017 9:11 AM  Lisa Hamilton  has presented today for surgery, with the diagnosis of RECURRENT HEPATIC CYST  The various methods of treatment have been discussed with the patient and family. After consideration of risks, benefits and other options for treatment, the patient has consented to  Procedure(s): LAPAROSCOPIC PARTIAL HEPATECTOMY (N/A) as a surgical intervention .  The patient's history has been reviewed, patient examined, no change in status, stable for surgery.  I have reviewed the patient's chart and labs.  Questions were answered to the patient's satisfaction.     [name]

## 2017-12-04 NOTE — Anesthesia Procedure Notes (Signed)
Procedure Name: Intubation Date/Time: 12/04/2017 11:12 AM Performed by: Lieutenant Diego, CRNA Pre-anesthesia Checklist: Patient identified, Emergency Drugs available, Suction available and Patient being monitored Patient Re-evaluated:Patient Re-evaluated prior to induction Oxygen Delivery Method: Circle system utilized Preoxygenation: Pre-oxygenation with 100% oxygen Induction Type: IV induction Ventilation: Mask ventilation without difficulty Laryngoscope Size: Miller and 2 Grade View: Grade I Tube type: Oral Tube size: 7.0 mm Number of attempts: 1 Airway Equipment and Method: Stylet and Oral airway Placement Confirmation: ETT inserted through vocal cords under direct vision,  positive ETCO2 and breath sounds checked- equal and bilateral Secured at: 21 cm Tube secured with: Tape Dental Injury: Teeth and Oropharynx as per pre-operative assessment and Injury to lip

## 2017-12-04 NOTE — Op Note (Addendum)
PRE-OPERATIVE DIAGNOSIS: liver cyst, recurrent, symptomatic  POST-OPERATIVE DIAGNOSIS: Same  PROCEDURE: Procedure(s): Laparoscopic left liver cyst unroofing (marsupialization or fenestration), right lap converted to open partial hepatectomy  SURGEON: Surgeon(s): [name], MD  Assistant: Judyann Munson, RNFA  ANESTHESIA: local and general  DRAINS: 19 Fr blake drain in the RUQ  LOCAL MEDICATIONS USED: BUPIVICAINE and LIDOCAINE   SPECIMEN: Source of Specimen:left liver cyst wall, right portion of liver including portion of cyst wall  DISPOSITION OF SPECIMEN: PATHOLOGY  COUNTS: YES  DICTATION: .Dragon Dictation  PLAN OF CARE: Admit to inpatient   PATIENT DISPOSITION: PACU - hemodynamically stable.  FINDINGS: Moderate left liver cyst, large right liver cyst.    EBL: 1 L  PROCEDURE:   Pt was identified in the holding area and taken to the OR where she was placed supine on the operating room table. General anesthesia was induced. The patient's abdomen was prepped and draped in sterile fashion. A timeout was performed according to the surgical safety checklist. When all was correct, we continued.   The infraumbilical skin was infiltrated with local anesthetic. A horizontal incision was made with the #11 blade. A kelly clamp was used to spread the subcutaneous skin. Two Kocher clamps were used to elevate the fascia. The #11 blade was used to incise the fascia. A 0-0 vicryl pursestring suture was placed into the fascia. The hasson trocar was advanced into the abdomen and pneumoperitoneum was achieved to a pressure of 15 mm Hg. There were several adhesions around the hasson trocar.    Two 5 mm trocars were placed in the left abdomen. The adhesions were taken down around the hasson trocar.  The cyst was identified. The harmonic scalpel was used to enter the cyst. The fluid was suctioned out. An additional 5 mm port was placed on the left and one on the  right.  The harmonic scalpel was used to take off a portion of the cyst. The Echelon stapler was used to staple the bulk of the surrounding parenchyma around it.  SNOW hemostatic agent was placed against the posterior cyst wall. Hemostasis was achieved with cautery.   Attention was then directed to the right liver. An additional RUQ 5 mm port was placed.  The cyst was identified.  The cyst was entered and chocolate brown fluid was aspirated.  The cyst wall was retracted.  The cyst was adherent to the diaphragm.  These adhesions were taken down.  However, the posterior aspect of the cyst started hemorrhaging.  The right hepatic vein was clamped down with a locking grasper.  A hand port was placed, but there was not adequate length to control this with a stapler.  The hand port was opened to a right subcostal incision incorporating one of the trocars.    The hepatic vein was grasped with two long Allis clamps and this was repaired with 3-0 prolene.  No additional bleeding was seen.  The bookwalter was then set up for retraction.  The harmonic and the stapler was used to remove the bulk of the cyst wall laterally, superiorly, anteriorly, and posteriorly with a fair amount of liver parenchyma.  There was a defect in the diaphragm that was closed with running #1 PDS suture.  The air in the chest was aspirated with a red rubber catheter.    The abdomen was irrigated.  Arista was placed in the liver bed.  The 19 Fr blake drain was placed in the RUQ and secured with a 2-0 nylon in the other  right sided port site.  OnQ tunnelers were placed in the abdominal wall muscle layers.  The subcostal incision was closed with four running #1 looped PDS suture in 2 layers. The skin was irrigated and closed with staples.     The pursestring suture was tied down at the hasson incision. There was no residual palpable fascial defect. The skin of the port sites was closed with 4-0 monocryl in subcuticular fashion. Needle,  sponge, and instrument counts were correct times 2.   The patient was taken to the PACU in stable condition.

## 2017-12-05 ENCOUNTER — Other Ambulatory Visit: Payer: Self-pay

## 2017-12-05 ENCOUNTER — Encounter (HOSPITAL_COMMUNITY): Payer: Self-pay | Admitting: General Surgery

## 2017-12-05 LAB — PROTIME-INR
INR: 1.38
Prothrombin Time: 16.9 seconds — ABNORMAL HIGH (ref 11.4–15.2)

## 2017-12-05 LAB — CBC
HCT: 32.1 % — ABNORMAL LOW (ref 36.0–46.0)
Hemoglobin: 9.7 g/dL — ABNORMAL LOW (ref 12.0–15.0)
MCH: 29 pg (ref 26.0–34.0)
MCHC: 30.2 g/dL (ref 30.0–36.0)
MCV: 96.1 fL (ref 78.0–100.0)
PLATELETS: 247 10*3/uL (ref 150–400)
RBC: 3.34 MIL/uL — AB (ref 3.87–5.11)
RDW: 15.5 % (ref 11.5–15.5)
WBC: 19.2 10*3/uL — ABNORMAL HIGH (ref 4.0–10.5)

## 2017-12-05 LAB — COMPREHENSIVE METABOLIC PANEL
ALBUMIN: 2.6 g/dL — AB (ref 3.5–5.0)
ALT: 155 U/L — AB (ref 14–54)
AST: 209 U/L — AB (ref 15–41)
Alkaline Phosphatase: 81 U/L (ref 38–126)
Anion gap: 6 (ref 5–15)
BUN: 14 mg/dL (ref 6–20)
CHLORIDE: 106 mmol/L (ref 101–111)
CO2: 27 mmol/L (ref 22–32)
CREATININE: 1.12 mg/dL — AB (ref 0.44–1.00)
Calcium: 7.6 mg/dL — ABNORMAL LOW (ref 8.9–10.3)
GFR calc non Af Amer: 49 mL/min — ABNORMAL LOW (ref >60)
GFR, EST AFRICAN AMERICAN: 56 mL/min — AB (ref >60)
GLUCOSE: 163 mg/dL — AB (ref 65–99)
Potassium: 5.1 mmol/L (ref 3.5–5.1)
SODIUM: 139 mmol/L (ref 135–145)
Total Bilirubin: 0.6 mg/dL (ref 0.3–1.2)
Total Protein: 4.7 g/dL — ABNORMAL LOW (ref 6.5–8.1)

## 2017-12-05 LAB — MAGNESIUM: Magnesium: 1.5 mg/dL — ABNORMAL LOW (ref 1.7–2.4)

## 2017-12-05 LAB — PHOSPHORUS: PHOSPHORUS: 4.9 mg/dL — AB (ref 2.5–4.6)

## 2017-12-05 MED ORDER — ALBUMIN HUMAN 5 % IV SOLN
INTRAVENOUS | Status: AC
  Filled 2017-12-05: qty 250

## 2017-12-05 MED ORDER — SODIUM CHLORIDE 0.9% FLUSH
10.0000 mL | Freq: Two times a day (BID) | INTRAVENOUS | Status: DC
  Administered 2017-12-06 – 2017-12-10 (×7): 10 mL

## 2017-12-05 MED ORDER — ALBUMIN HUMAN 5 % IV SOLN
25.0000 g | Freq: Once | INTRAVENOUS | Status: AC
  Administered 2017-12-05: 25 g via INTRAVENOUS
  Filled 2017-12-05: qty 250

## 2017-12-05 MED ORDER — SODIUM CHLORIDE 0.9% FLUSH
10.0000 mL | INTRAVENOUS | Status: DC | PRN
  Administered 2017-12-08 – 2017-12-10 (×2): 10 mL
  Filled 2017-12-05 (×2): qty 40

## 2017-12-05 NOTE — Progress Notes (Signed)
1 Day Post-Op   Subjective/Chief Complaint: Sore, but dilaudid controlling pain better than morphine.  Soft BP overnight.    Objective: Vital signs in last 24 hours: Temp:  [97.2 F (36.2 C)-98.9 F (37.2 C)] 98.9 F (37.2 C) (05/03 0805) Pulse Rate:  [51-121] 100 (05/03 0900) Resp:  [8-29] 15 (05/03 0900) BP: (72-136)/(39-76) 107/44 (05/03 0900) SpO2:  [79 %-100 %] 98 % (05/03 0900) Weight:  [96 kg (211 lb 10.3 oz)] 96 kg (211 lb 10.3 oz) (05/02 1625)    Intake/Output from previous day: 05/02 0701 - 05/03 0700 In: 4039.9 [I.V.:3429.9; IV Piggyback:450] Out: 1720 [Urine:285; Drains:435; Blood:1000] Intake/Output this shift: No intake/output data recorded.  General appearance: alert, cooperative and mild distress Resp: breathing comfortably Cardio: regular rate and rhythm GI: soft, non distended, dressing c/d/i.  drain serosang. Extremities: extremities normal, atraumatic, no cyanosis or edema  Lab Results:  Recent Labs    12/04/17 1727 12/05/17 0253  WBC 18.6* 19.2*  HGB 10.6* 9.7*  HCT 34.9* 32.1*  PLT 264 247   BMET Recent Labs    12/04/17 1727 12/05/17 0253  NA 141 139  K 4.1 5.1  CL 108 106  CO2 28 27  GLUCOSE 213* 163*  BUN 11 14  CREATININE 0.76 1.12*  CALCIUM 7.8* 7.6*   PT/INR Recent Labs    12/05/17 0253  LABPROT 16.9*  INR 1.38   ABG Recent Labs    12/04/17 1347  HCO3 29.0*    Studies/Results: Dg Chest Port 1 View  Result Date: 12/04/2017 CLINICAL DATA:  Postoperative pain. EXAM: PORTABLE CHEST 1 VIEW COMPARISON:  Radiograph October 16, 2017. FINDINGS: Stable cardiomegaly. No pneumothorax or significant pleural effusion is noted. Bony thorax is unremarkable. Probable mild bibasilar subsegmental atelectasis is noted. IMPRESSION: Probable mild bibasilar subsegmental atelectasis. Electronically Signed   By: Marijo Conception, M.D.   On: 12/04/2017 16:09    Anti-infectives: Anti-infectives (From admission, onward)   Start     Dose/Rate  Route Frequency Ordered Stop   12/04/17 2200  ciprofloxacin (CIPRO) IVPB 400 mg     400 mg 200 mL/hr over 60 Minutes Intravenous Every 12 hours 12/04/17 1624 12/04/17 2250   12/04/17 0825  ciprofloxacin (CIPRO) IVPB 400 mg     400 mg 200 mL/hr over 60 Minutes Intravenous On call to O.R. 12/04/17 0825 12/04/17 1145      Assessment/Plan: s/p Procedure(s): LAPAROSCOPIC PARTIAL HEPATECTOMY (N/A) Continue foley due to urinary output monitoring  ABL anemia - transfused 1 unit, but 1 L EBL.  If stable tomorrow, can start lovenox. Hypomagnesemia - replete Hypotension - holding cozaar.  Albumin bolus today.   Keep in ICU today.   Acute kidney injury - unsurprising considering hypotension yesterday.  Will follow. Leukocytosis - will follow.     LOS: 1 day    Lisa Hamilton 12/05/2017

## 2017-12-05 NOTE — Anesthesia Postprocedure Evaluation (Signed)
Anesthesia Post Note  Patient: Lisa Hamilton  Procedure(s) Performed: LAPAROSCOPIC PARTIAL HEPATECTOMY (N/A Abdomen)     Patient location during evaluation: PACU Anesthesia Type: General Level of consciousness: awake and alert Pain management: pain level controlled Vital Signs Assessment: post-procedure vital signs reviewed and stable Respiratory status: spontaneous breathing, nonlabored ventilation and respiratory function stable Cardiovascular status: blood pressure returned to baseline and stable Postop Assessment: no apparent nausea or vomiting Anesthetic complications: no    Last Vitals:  Vitals:   12/05/17 0730 12/05/17 0805  BP: (!) 86/43   Pulse: 96   Resp: 11   Temp:  37.2 C  SpO2: 95%     Last Pain:  Vitals:   12/05/17 0805  TempSrc: Axillary  PainSc:                  Lynda Rainwater

## 2017-12-06 ENCOUNTER — Encounter (HOSPITAL_COMMUNITY): Payer: Self-pay | Admitting: Surgery

## 2017-12-06 LAB — CREATININE, SERUM
CREATININE: 0.89 mg/dL (ref 0.44–1.00)
GFR calc Af Amer: >60 mL/min (ref >60)

## 2017-12-06 LAB — MAGNESIUM: Magnesium: 1.7 mg/dL (ref 1.7–2.4)

## 2017-12-06 LAB — POTASSIUM: POTASSIUM: 3.8 mmol/L (ref 3.5–5.1)

## 2017-12-06 MED ORDER — MAGIC MOUTHWASH
15.0000 mL | Freq: Four times a day (QID) | ORAL | Status: DC | PRN
  Administered 2017-12-09: 15 mL via ORAL
  Filled 2017-12-06: qty 15

## 2017-12-06 MED ORDER — METHOCARBAMOL 500 MG PO TABS
1000.0000 mg | ORAL_TABLET | Freq: Four times a day (QID) | ORAL | Status: DC | PRN
  Administered 2017-12-08 – 2017-12-10 (×5): 1000 mg via ORAL
  Filled 2017-12-06 (×5): qty 2

## 2017-12-06 MED ORDER — PSYLLIUM 95 % PO PACK
1.0000 | PACK | Freq: Two times a day (BID) | ORAL | Status: DC
  Administered 2017-12-06 – 2017-12-09 (×4): 1 via ORAL
  Filled 2017-12-06 (×11): qty 1

## 2017-12-06 MED ORDER — ALBUMIN HUMAN 5 % IV SOLN
25.0000 g | Freq: Three times a day (TID) | INTRAVENOUS | Status: AC | PRN
  Filled 2017-12-06: qty 500

## 2017-12-06 MED ORDER — IOPAMIDOL (ISOVUE-370) INJECTION 76%
INTRAVENOUS | Status: AC
  Filled 2017-12-06: qty 50

## 2017-12-06 MED ORDER — METHOCARBAMOL 1000 MG/10ML IJ SOLN: 1000.0000 mg | Freq: Four times a day (QID) | INTRAVENOUS | Status: DC | PRN

## 2017-12-06 MED ORDER — SODIUM CHLORIDE 0.9 % IV SOLN
0.0000 ug/min | INTRAVENOUS | Status: DC
  Administered 2017-12-06: 87 ug/min via INTRAVENOUS
  Administered 2017-12-06: 80 ug/min via INTRAVENOUS
  Administered 2017-12-06: 87 ug/min via INTRAVENOUS
  Administered 2017-12-06: 30 ug/min via INTRAVENOUS
  Administered 2017-12-06: 40 ug/min via INTRAVENOUS
  Administered 2017-12-06: 86 ug/min via INTRAVENOUS
  Administered 2017-12-07: 10 ug/min via INTRAVENOUS
  Filled 2017-12-06: qty 10
  Filled 2017-12-06 (×2): qty 1
  Filled 2017-12-06: qty 10
  Filled 2017-12-06 (×2): qty 1
  Filled 2017-12-06: qty 10

## 2017-12-06 MED ORDER — BLISTEX MEDICATED EX OINT
1.0000 "application " | TOPICAL_OINTMENT | Freq: Two times a day (BID) | CUTANEOUS | Status: DC
  Administered 2017-12-06 – 2017-12-11 (×11): 1 via TOPICAL
  Filled 2017-12-06 (×2): qty 6.3

## 2017-12-06 MED ORDER — ALUM & MAG HYDROXIDE-SIMETH 200-200-20 MG/5ML PO SUSP
30.0000 mL | Freq: Four times a day (QID) | ORAL | Status: DC | PRN
  Filled 2017-12-06: qty 30

## 2017-12-06 MED ORDER — ENSURE SURGERY PO LIQD
237.0000 mL | Freq: Two times a day (BID) | ORAL | Status: DC
  Administered 2017-12-06 – 2017-12-10 (×7): 237 mL via ORAL
  Filled 2017-12-06 (×14): qty 237

## 2017-12-06 MED ORDER — ALBUMIN HUMAN 5 % IV SOLN
25.0000 g | Freq: Once | INTRAVENOUS | Status: AC
  Administered 2017-12-06: 25 g via INTRAVENOUS
  Filled 2017-12-06: qty 500

## 2017-12-06 MED ORDER — METHOCARBAMOL 1000 MG/10ML IJ SOLN
1000.0000 mg | Freq: Four times a day (QID) | INTRAMUSCULAR | Status: DC | PRN
  Filled 2017-12-06: qty 10

## 2017-12-06 MED ORDER — ORAL CARE MOUTH RINSE
15.0000 mL | Freq: Two times a day (BID) | OROMUCOSAL | Status: DC
  Administered 2017-12-06 – 2017-12-10 (×7): 15 mL via OROMUCOSAL

## 2017-12-06 MED ORDER — ACETAMINOPHEN 325 MG PO TABS
650.0000 mg | ORAL_TABLET | Freq: Three times a day (TID) | ORAL | Status: DC
  Administered 2017-12-06 – 2017-12-07 (×3): 650 mg via ORAL
  Filled 2017-12-06 (×4): qty 2

## 2017-12-06 MED ORDER — METOCLOPRAMIDE HCL 5 MG/ML IJ SOLN
10.0000 mg | Freq: Four times a day (QID) | INTRAMUSCULAR | Status: DC | PRN
  Filled 2017-12-06: qty 2

## 2017-12-06 MED ORDER — PHENOL 1.4 % MT LIQD
1.0000 | OROMUCOSAL | Status: DC | PRN
  Filled 2017-12-06: qty 177

## 2017-12-06 MED ORDER — METHOCARBAMOL 500 MG PO TABS: 1000.0000 mg | ORAL_TABLET | Freq: Four times a day (QID) | ORAL | Status: DC | PRN

## 2017-12-06 MED ORDER — MENTHOL 3 MG MT LOZG
1.0000 | LOZENGE | OROMUCOSAL | Status: DC | PRN
  Filled 2017-12-06: qty 9

## 2017-12-06 MED ORDER — HYDROCORTISONE 1 % EX CREA
1.0000 "application " | TOPICAL_CREAM | Freq: Three times a day (TID) | CUTANEOUS | Status: DC | PRN
  Filled 2017-12-06: qty 28

## 2017-12-06 MED ORDER — FENTANYL CITRATE (PF) 100 MCG/2ML IJ SOLN
25.0000 ug | INTRAMUSCULAR | Status: DC | PRN
  Administered 2017-12-07: 50 ug via INTRAVENOUS
  Filled 2017-12-06: qty 2

## 2017-12-06 MED ORDER — FUROSEMIDE 80 MG PO TABS
80.0000 mg | ORAL_TABLET | Freq: Two times a day (BID) | ORAL | Status: DC
  Administered 2017-12-06 – 2017-12-09 (×6): 80 mg via ORAL
  Filled 2017-12-06 (×4): qty 1
  Filled 2017-12-06: qty 2
  Filled 2017-12-06 (×2): qty 1

## 2017-12-06 MED ORDER — HYDROCORTISONE 2.5 % RE CREA
1.0000 "application " | TOPICAL_CREAM | Freq: Four times a day (QID) | RECTAL | Status: DC | PRN
  Filled 2017-12-06: qty 28.35

## 2017-12-06 MED ORDER — BISACODYL 10 MG RE SUPP: 10.0000 mg | Freq: Two times a day (BID) | RECTAL | Status: DC | PRN

## 2017-12-06 MED ORDER — OXYCODONE HCL 5 MG PO TABS
5.0000 mg | ORAL_TABLET | ORAL | Status: DC | PRN
  Administered 2017-12-08 – 2017-12-09 (×2): 5 mg via ORAL
  Filled 2017-12-06 (×3): qty 1

## 2017-12-06 NOTE — Progress Notes (Signed)
Pt unable to void since foley cath removal. Pt's bladder scan results showed 175cc's in bladder. Pt rescanned at 0524 bladder scan result showed 564 in bladder. Pt I/O cathed with 700cc's urine removed, will continue to monitor.

## 2017-12-06 NOTE — Progress Notes (Signed)
[address] [place], [place] [address] Phone: [phone]  FAX: [phone]      Lisa Hamilton [id number] Mar 28, 1946  CARE TEAM:  PCP: Hoyt Koch, MD  Outpatient Care Team: Patient Care Team: Hoyt Koch, MD as PCP - General (Internal Medicine) Irine Seal, MD (Urology) Rolm Bookbinder, MD (Dermatology) Rutherford Guys, MD (Ophthalmology) [place], Romilda Garret, [place] (Podiatry) Deneise Lever, MD (Pulmonary Disease) Milus Banister, MD (Gastroenterology) Jerelyn Charles, MD as Consulting Physician (Obstetrics) Noralee Space, MD as Consulting Physician (Pulmonary Disease) [place], Laurice Record, MD (Inactive) as Consulting Physician (Orthopedic Surgery) Edrick Kins, DPM as Consulting Physician (Podiatry) Jerelyn Charles, MD as Consulting Physician (Obstetrics)  Inpatient Treatment Team: Treatment Team: Attending Provider: [name], MD; Case Manager: Royston Bake, RN   Problem List:   Principal Problem:   Liver cyst s/p partial hepatectomy 12/04/2017 Active Problems:   Morbid obesity ([place])   Status post laparoscopy   2 Days Post-Op  12/04/2017  PRE-OPERATIVE DIAGNOSIS: liver cyst, recurrent, symptomatic  POST-OPERATIVE DIAGNOSIS: Same  PROCEDURE:  Laparoscopic left liver cyst unroofing (marsupialization or fenestration),  Right lap converted to open partial hepatectomy  SURGEON: Surgeon(s): [name], MD     Assessment  Fair  Plan: Keep in ICU today.    Foley had been removed - replace esp with urin retention last ngiht ABL anemia - transfused 1 unit, but 1 L EBL.  Hgb down a little & still on Neo  If stable 5/5, can start lovenox. Hypomagnesemia - repleted - follow Hypotension - Albumin bolus today & PRN.  Follow off IVF & diuretics.  Hold BP meds Pureed diet - hold on advance until +flatus/BM RLS - requip Chronic pain - tylenol & gabapentin sched.  OnQ Leukocytosis  - will follow.    30 minutes spent in review, evaluation, examination, counseling, and coordination of care.  More than 50% of that time was spent in counseling.  Adin Hector, M.D., F.A.C.S. Gastrointestinal and Minimally Invasive Surgery [facility] [address] [place], [place] [address] [phone] Main / Paging   12/06/2017    Subjective: (Chief complaint)  Sore Sleepy - not taking PO pills well ICU RN & CNA in room  Objective:  Vital signs:  Vitals:   12/06/17 0530 12/06/17 0545 12/06/17 0600 12/06/17 0700  BP: (!) 95/45 (!) 99/43 (!) 103/44 (!) 107/46  Pulse: (!) 107 (!) 105 (!) 105 (!) 106  Resp: _0 Temp:      TempSrc:      SpO2: 98% 100% 100% 100%  Weight:      Height:        Last BM Date: (P) (PTA)  Intake/Output   Yesterday:  05/03 0701 - 05/04 0700 In: 5161.1 [I.V.:4661.1; IV Piggyback:500] Out: 1350 [Urine:1000; Drains:350] This shift:  Total I/O In: 204.7 [I.V.:204.7] Out: 0   Bowel function:  Flatus: No  BM:  No  Drain: Serosanguinous   Physical Exam:  General: Pt sleepy but oriented x4 in mild acute distress Eyes: PERRL, normal EOM.  Sclera clear.  No icterus Neuro: CN II-XII intact w/o focal sensory/motor deficits. Lymph: No head/neck/groin lymphadenopathy Psych:  No psychosis/paranoia. HENT: Normocephalic, Mucus membranes moist.  No thrush Neck: Supple, No tracheal deviation Chest: No chest wall pain w good excursion CV:  Pulses intact.  Regular rhythm MS: Normal AROM mjr joints.  No obvious deformity  Abdomen: OBESE. Somewhat  firm.  Mildy distended.  Mildly tender at incisions only.  No evidence of peritonitis.  No incarcerated hernias.  Ext:   No deformity.  No mjr edema.  No cyanosis Skin: No petechiae / purpura  Results:   Labs: Results for orders placed or performed during the hospital encounter of 12/04/17 (from the past 48 hour(s))  Prepare RBC     Status: None    Collection Time: 12/04/17  1:40 PM  Result Value Ref Range   Order Confirmation      ORDER PROCESSED BY BLOOD BANK Performed at [facility], [place] [address] [place], [place] [address]   POCT I-Stat EG7     Status: Abnormal   Collection Time: 12/04/17  1:47 PM  Result Value Ref Range   pH, Ven 7.233 (L) 7.250 - 7.430   pCO2, Ven 67.6 (H) 44.0 - 60.0 mmHg   pO2, Ven 113.0 (H) 32.0 - 45.0 mmHg   Bicarbonate 29.0 (H) 20.0 - 28.0 mmol/L   TCO2 31 22 - 32 mmol/L   O2 Saturation 98.0 %   Sodium 142 135 - 145 mmol/L   Potassium 3.9 3.5 - 5.1 mmol/L   Calcium, Ion 1.18 1.15 - 1.40 mmol/L   HCT 29.0 (L) 36.0 - 46.0 %   Hemoglobin 9.9 (L) 12.0 - 15.0 g/dL   Patient temperature 35.6 C    Sample type VENOUS    Comment NOTIFIED PHYSICIAN   MRSA PCR Screening     Status: None   Collection Time: 12/04/17  4:26 PM  Result Value Ref Range   MRSA by PCR NEGATIVE NEGATIVE    Comment:        The GeneXpert MRSA Assay (FDA approved for NASAL specimens only), is one component of a comprehensive MRSA colonization surveillance program. It is not intended to diagnose MRSA infection nor to guide or monitor treatment for MRSA infections. Performed at [facility], [place] [address] [place], [place] [address]   CBC     Status: Abnormal   Collection Time: 12/04/17  5:27 PM  Result Value Ref Range   WBC 18.6 (H) 4.0 - 10.5 K/uL   RBC 3.61 (L) 3.87 - 5.11 MIL/uL   Hemoglobin 10.6 (L) 12.0 - 15.0 g/dL   HCT 34.9 (L) 36.0 - 46.0 %   MCV 96.7 78.0 - 100.0 fL   MCH 29.4 26.0 - 34.0 pg   MCHC 30.4 30.0 - 36.0 g/dL   RDW 15.0 11.5 - 15.5 %   Platelets 264 150 - 400 K/uL    Comment: Performed at [facility] [address] [place], [place] [address]  Basic metabolic panel     Status: Abnormal   Collection Time: 12/04/17  5:27 PM  Result Value Ref Range   Sodium 141 135 - 145 mmol/L   Potassium 4.1 3.5 - 5.1 mmol/L   Chloride 108 101 - 111 mmol/L   CO2 28 22 - 32 mmol/L    Glucose, Bld 213 (H) 65 - 99 mg/dL   BUN 11 6 - 20 mg/dL   Creatinine, Ser 0.76 0.44 - 1.00 mg/dL   Calcium 7.8 (L) 8.9 - 10.3 mg/dL   GFR calc non Af Amer >60 >60 mL/min   GFR calc Af Amer >60 >60 mL/min    Comment: (NOTE) The eGFR has been calculated using the CKD EPI equation. This calculation has not been validated in all clinical situations. eGFR's persistently <60 mL/min signify possible Chronic Kidney Disease.  Anion gap 5 5 - 15    Comment: Performed at [facility] [address], [place], [place] [address]  Comprehensive metabolic panel     Status: Abnormal   Collection Time: 12/05/17  2:53 AM  Result Value Ref Range   Sodium 139 135 - 145 mmol/L   Potassium 5.1 3.5 - 5.1 mmol/L    Comment: DELTA CHECK NOTED   Chloride 106 101 - 111 mmol/L   CO2 27 22 - 32 mmol/L   Glucose, Bld 163 (H) 65 - 99 mg/dL   BUN 14 6 - 20 mg/dL   Creatinine, Ser 1.12 (H) 0.44 - 1.00 mg/dL   Calcium 7.6 (L) 8.9 - 10.3 mg/dL   Total Protein 4.7 (L) 6.5 - 8.1 g/dL   Albumin 2.6 (L) 3.5 - 5.0 g/dL   AST 209 (H) 15 - 41 U/L   ALT 155 (H) 14 - 54 U/L   Alkaline Phosphatase 81 38 - 126 U/L   Total Bilirubin 0.6 0.3 - 1.2 mg/dL   GFR calc non Af Amer 49 (L) >60 mL/min   GFR calc Af Amer 56 (L) >60 mL/min    Comment: (NOTE) The eGFR has been calculated using the CKD EPI equation. This calculation has not been validated in all clinical situations. eGFR's persistently <60 mL/min signify possible Chronic Kidney Disease.    Anion gap 6 5 - 15    Comment: Performed at [facility] [address], [place], [place] [address]  CBC     Status: Abnormal   Collection Time: 12/05/17  2:53 AM  Result Value Ref Range   WBC 19.2 (H) 4.0 - 10.5 K/uL   RBC 3.34 (L) 3.87 - 5.11 MIL/uL   Hemoglobin 9.7 (L) 12.0 - 15.0 g/dL   HCT 32.1 (L) 36.0 - 46.0 %   MCV 96.1 78.0 - 100.0 fL   MCH 29.0 26.0 - 34.0 pg   MCHC 30.2 30.0 - 36.0 g/dL   RDW 15.5 11.5 - 15.5 %   Platelets 247 150 - 400 K/uL     Comment: Performed at [facility], [place] [address] [place], [place] [address]  Protime-INR     Status: Abnormal   Collection Time: 12/05/17  2:53 AM  Result Value Ref Range   Prothrombin Time 16.9 (H) 11.4 - 15.2 seconds   INR 1.38     Comment: Performed at [facility] [address], [place], [place] [address]  Magnesium     Status: Abnormal   Collection Time: 12/05/17  2:53 AM  Result Value Ref Range   Magnesium 1.5 (L) 1.7 - 2.4 mg/dL    Comment: Performed at [facility] [address], [place], [place] [address]  Phosphorus     Status: Abnormal   Collection Time: 12/05/17  2:53 AM  Result Value Ref Range   Phosphorus 4.9 (H) 2.5 - 4.6 mg/dL    Comment: Performed at [facility] [address] [place], [place] [address]    Imaging / Studies: Dg Chest Port 1 View  Result Date: 12/04/2017 CLINICAL DATA:  Postoperative pain. EXAM: PORTABLE CHEST 1 VIEW COMPARISON:  Radiograph October 16, 2017. FINDINGS: Stable cardiomegaly. No pneumothorax or significant pleural effusion is noted. Bony thorax is unremarkable. Probable mild bibasilar subsegmental atelectasis is noted. IMPRESSION: Probable mild bibasilar subsegmental atelectasis. Electronically Signed   By: Marijo Conception, M.D.   On: 12/04/2017 16:09    Medications / Allergies:  per chart  Antibiotics: Anti-infectives (From admission, onward)   Start     Dose/Rate Route Frequency Ordered Stop   12/04/17 2200  ciprofloxacin (CIPRO) IVPB 400 mg     400 mg 200 mL/hr over 60 Minutes Intravenous Every 12 hours 12/04/17 1624 12/04/17 2250   12/04/17 0825  ciprofloxacin (CIPRO) IVPB 400 mg     400 mg 200 mL/hr over 60 Minutes Intravenous On call to O.R. 12/04/17 0825 12/04/17 1145        Note: Portions of this report may have been transcribed using voice recognition software. Every effort was made to ensure accuracy; however, inadvertent computerized transcription errors may be present.   Any  transcriptional errors that result from this process are unintentional.     Adin Hector, M.D., F.A.C.S. Gastrointestinal and Minimally Invasive Surgery [facility] [address], [place], [place] [address] [phone] Main / Paging   12/06/2017

## 2017-12-07 LAB — MAGNESIUM: Magnesium: 1.4 mg/dL — ABNORMAL LOW (ref 1.7–2.4)

## 2017-12-07 LAB — CBC
HEMATOCRIT: 25.9 % — AB (ref 36.0–46.0)
Hemoglobin: 7.9 g/dL — ABNORMAL LOW (ref 12.0–15.0)
MCH: 29.4 pg (ref 26.0–34.0)
MCHC: 30.5 g/dL (ref 30.0–36.0)
MCV: 96.3 fL (ref 78.0–100.0)
Platelets: 157 10*3/uL (ref 150–400)
RBC: 2.69 MIL/uL — AB (ref 3.87–5.11)
RDW: 15 % (ref 11.5–15.5)
WBC: 13.5 10*3/uL — AB (ref 4.0–10.5)

## 2017-12-07 LAB — COMPREHENSIVE METABOLIC PANEL
ALBUMIN: 2.6 g/dL — AB (ref 3.5–5.0)
ALK PHOS: 98 U/L (ref 38–126)
ALT: 140 U/L — AB (ref 14–54)
AST: 81 U/L — AB (ref 15–41)
Anion gap: 11 (ref 5–15)
BILIRUBIN TOTAL: 0.5 mg/dL (ref 0.3–1.2)
BUN: 11 mg/dL (ref 6–20)
CALCIUM: 7.5 mg/dL — AB (ref 8.9–10.3)
CO2: 30 mmol/L (ref 22–32)
CREATININE: 0.66 mg/dL (ref 0.44–1.00)
Chloride: 104 mmol/L (ref 101–111)
GFR calc Af Amer: >60 mL/min (ref >60)
GLUCOSE: 135 mg/dL — AB (ref 65–99)
Potassium: 2.5 mmol/L — CL (ref 3.5–5.1)
Sodium: 145 mmol/L (ref 135–145)
TOTAL PROTEIN: 4.9 g/dL — AB (ref 6.5–8.1)

## 2017-12-07 MED ORDER — MAGNESIUM SULFATE 4 GM/100ML IV SOLN
4.0000 g | Freq: Once | INTRAVENOUS | Status: AC
  Administered 2017-12-07: 4 g via INTRAVENOUS
  Filled 2017-12-07: qty 100

## 2017-12-07 MED ORDER — POTASSIUM CHLORIDE 10 MEQ/100ML IV SOLN
10.0000 meq | INTRAVENOUS | Status: AC
  Administered 2017-12-07 (×2): 10 meq via INTRAVENOUS
  Filled 2017-12-07 (×4): qty 100

## 2017-12-07 MED ORDER — ACETAMINOPHEN 500 MG PO TABS
1000.0000 mg | ORAL_TABLET | Freq: Three times a day (TID) | ORAL | Status: DC
  Administered 2017-12-07 – 2017-12-11 (×12): 1000 mg via ORAL
  Filled 2017-12-07 (×14): qty 2

## 2017-12-07 MED ORDER — POTASSIUM CHLORIDE CRYS ER 20 MEQ PO TBCR
40.0000 meq | EXTENDED_RELEASE_TABLET | Freq: Two times a day (BID) | ORAL | Status: DC
  Administered 2017-12-07 – 2017-12-08 (×3): 40 meq via ORAL
  Filled 2017-12-07 (×4): qty 2

## 2017-12-07 MED ORDER — IPRATROPIUM-ALBUTEROL 0.5-2.5 (3) MG/3ML IN SOLN
3.0000 mL | Freq: Three times a day (TID) | RESPIRATORY_TRACT | Status: DC
  Administered 2017-12-07 – 2017-12-11 (×12): 3 mL via RESPIRATORY_TRACT
  Filled 2017-12-07 (×13): qty 3

## 2017-12-07 MED ORDER — IPRATROPIUM-ALBUTEROL 0.5-2.5 (3) MG/3ML IN SOLN
3.0000 mL | Freq: Three times a day (TID) | RESPIRATORY_TRACT | Status: DC
  Administered 2017-12-07: 3 mL via RESPIRATORY_TRACT

## 2017-12-07 NOTE — Progress Notes (Signed)
[place]  [address] [place], [place] [address] Phone: [phone]  FAX: [phone]      [name] [id number] 1946-04-25  CARE TEAM:  PCP: Lisa Koch, MD  Outpatient Care Team: Patient Care Team: Lisa Koch, MD as PCP - General (Internal Medicine) Lisa Seal, MD (Urology) Lisa Bookbinder, MD (Dermatology) Lisa Guys, MD (Ophthalmology) [place], Lisa Hamilton, [place] (Podiatry) Lisa Lever, MD (Pulmonary Disease) Lisa Banister, MD (Gastroenterology) Lisa Charles, MD as Consulting Physician (Obstetrics) Lisa Space, MD as Consulting Physician (Pulmonary Disease) [place], Lisa Record, MD (Inactive) as Consulting Physician (Orthopedic Surgery) Lisa Hamilton, DPM as Consulting Physician (Podiatry) Lisa Charles, MD as Consulting Physician (Obstetrics)  Inpatient Treatment Team: Treatment Team: Attending Provider: [name], MD; Registered Nurse: Early Osmond, RN   Problem List:   Principal Problem:   Liver cyst s/p partial hepatectomy 12/04/2017 Active Problems:   Morbid obesity ([place])   RESTLESS LEG SYNDROME   Essential hypertension   Asthma in adult, mild persistent, uncomplicated   Osteoarthritis   Anemia   Hypokalemia   Status post laparoscopy   Hypomagnesemia   3 Days Post-Op  12/04/2017  PRE-OPERATIVE DIAGNOSIS: liver cyst, recurrent, symptomatic  POST-OPERATIVE DIAGNOSIS: Same  PROCEDURE:  Laparoscopic left liver cyst unroofing (marsupialization or fenestration),  Right lap converted to open partial hepatectomy  SURGEON: Surgeon(s): [name], MD     Assessment  Improving  Plan: Transfer to floor.    Foley had been removed - replace esp with urine retention last ngiht ABL anemia - transfused 1 unit, but 1 L EBL.  Hgb down a little more - follow on lovenox. Hypomagnesemia - replete & follow HypoK - replete & follow Hypotension - Albumin bolus today &  PRN.  Follow off IVF & diuretics.  Hold BP meds Adv solid diet with  +flatus/BM RLS - requip Chronic pain - tylenol & gabapentin sched.  OnQ - prob out Monday Leukocytosis - Improved - follow.    20 minutes spent in review, evaluation, examination, counseling, and coordination of care.  More than 50% of that time was spent in counseling.  Lisa Hamilton, M.D., F.A.C.S. Gastrointestinal and Minimally Invasive Surgery [facility] [address], [place], [place] [address] [phone] Main / Paging   12/07/2017    Subjective: (Chief complaint)  Sitting up in chair tol pureed Low K & Mag  Objective:  Vital signs:  Vitals:   12/07/17 0600 12/07/17 0610 12/07/17 0700 12/07/17 0750  BP: (!) 107/59  95/65   Pulse: (!) 144 (!) 144 (!) 142   Resp: 19 (!) 24 (!) 23   Temp:      TempSrc:      SpO2: 100% 100% 97% 100%  Weight:      Height:        Last BM Date: 12/06/17  Intake/Output   Yesterday:  05/04 0701 - 05/05 0700 In: 1714.5 [I.V.:1714.5] Out: 4250 [Urine:3875; Drains:375] This shift:  No intake/output data recorded.  Bowel function:  Flatus: YES  BM:  YES  Drain: Serosanguinous   Physical Exam:  General: Pt sleepy but oriented x4 in no acute distress Eyes: PERRL, normal EOM.  Sclera clear.  No icterus Neuro: CN II-XII intact w/o focal sensory/motor deficits. Lymph: No head/neck/groin lymphadenopathy Psych:  No psychosis/paranoia. HENT: Normocephalic, Mucus membranes moist.  No thrush Neck: Supple, No tracheal deviation Chest: No chest wall pain w good excursion CV:  Pulses  intact.  Regular rhythm MS: Normal AROM mjr joints.  No obvious deformity  Abdomen: OBESE. Soft.  Nondistended.  Mildly tender at incisions only.  No evidence of peritonitis.  No incarcerated hernias.  Ext:   No deformity.  No mjr edema.  No cyanosis Skin: No petechiae / purpura  Results:   Labs: Results for orders placed or performed  during the hospital encounter of 12/04/17 (from the past 48 hour(s))  Magnesium     Status: None   Collection Time: 12/06/17  9:32 AM  Result Value Ref Range   Magnesium 1.7 1.7 - 2.4 mg/dL    Comment: Performed at [facility], [place] [address] [place], [place] [address]  Potassium     Status: None   Collection Time: 12/06/17  9:32 AM  Result Value Ref Range   Potassium 3.8 3.5 - 5.1 mmol/L    Comment: Performed at [facility] [address], [place], [place] [address]  Creatinine, serum     Status: None   Collection Time: 12/06/17  9:32 AM  Result Value Ref Range   Creatinine, Ser 0.89 0.44 - 1.00 mg/dL   GFR calc non Af Amer >60 >60 mL/min   GFR calc Af Amer >60 >60 mL/min    Comment: (NOTE) The eGFR has been calculated using the CKD EPI equation. This calculation has not been validated in all clinical situations. eGFR's persistently <60 mL/min signify possible Chronic Kidney Disease. Performed at [facility], [place] [address] [place], [place] [address]   CBC     Status: Abnormal   Collection Time: 12/07/17  5:37 AM  Result Value Ref Range   WBC 13.5 (H) 4.0 - 10.5 K/uL   RBC 2.69 (L) 3.87 - 5.11 MIL/uL   Hemoglobin 7.9 (L) 12.0 - 15.0 g/dL   HCT 25.9 (L) 36.0 - 46.0 %   MCV 96.3 78.0 - 100.0 fL   MCH 29.4 26.0 - 34.0 pg   MCHC 30.5 30.0 - 36.0 g/dL   RDW 15.0 11.5 - 15.5 %   Platelets 157 150 - 400 K/uL    Comment: Performed at [facility], [place] [address], [place], [place] [address]  Magnesium     Status: Abnormal   Collection Time: 12/07/17  5:37 AM  Result Value Ref Range   Magnesium 1.4 (L) 1.7 - 2.4 mg/dL    Comment: Performed at [facility] [address] [place], [place] [address]  Comprehensive metabolic panel     Status: Abnormal   Collection Time: 12/07/17  5:37 AM  Result Value Ref Range   Sodium 145 135 - 145 mmol/L   Potassium 2.5 (LL) 3.5 - 5.1 mmol/L    Comment: CRITICAL RESULT CALLED TO, READ BACK BY AND VERIFIED  WITH: C.HAYES RN @ [phone] 12/07/17 BY C.EDENS    Chloride 104 101 - 111 mmol/L   CO2 30 22 - 32 mmol/L   Glucose, Bld 135 (H) 65 - 99 mg/dL   BUN 11 6 - 20 mg/dL   Creatinine, Ser 0.66 0.44 - 1.00 mg/dL   Calcium 7.5 (L) 8.9 - 10.3 mg/dL   Total Protein 4.9 (L) 6.5 - 8.1 g/dL   Albumin 2.6 (L) 3.5 - 5.0 g/dL   AST 81 (H) 15 - 41 U/L   ALT 140 (H) 14 - 54 U/L   Alkaline Phosphatase 98 38 - 126 U/L   Total Bilirubin 0.5 0.3 - 1.2 mg/dL   GFR calc  non Af Amer >60 >60 mL/min   GFR calc Af Amer >60 >60 mL/min    Comment: (NOTE) The eGFR has been calculated using the CKD EPI equation. This calculation has not been validated in all clinical situations. eGFR's persistently <60 mL/min signify possible Chronic Kidney Disease.    Anion gap 11 5 - 15    Comment: Performed at [facility] [address], [place], [place] [address]    Imaging / Studies: No results found.  Medications / Allergies: per chart  Antibiotics: Anti-infectives (From admission, onward)   Start     Dose/Rate Route Frequency Ordered Stop   12/04/17 2200  ciprofloxacin (CIPRO) IVPB 400 mg     400 mg 200 mL/hr over 60 Minutes Intravenous Every 12 hours 12/04/17 1624 12/04/17 2250   12/04/17 0825  ciprofloxacin (CIPRO) IVPB 400 mg     400 mg 200 mL/hr over 60 Minutes Intravenous On call to O.R. 12/04/17 0825 12/04/17 1145        Note: Portions of this report may have been transcribed using voice recognition software. Every effort was made to ensure accuracy; however, inadvertent computerized transcription errors may be present.   Any transcriptional errors that result from this process are unintentional.     Lisa Hamilton, M.D., F.A.C.S. Gastrointestinal and Minimally Invasive Surgery [facility] [address], [place], [place] [address] [phone] Main / Paging   12/07/2017

## 2017-12-08 LAB — BPAM RBC
BLOOD PRODUCT EXPIRATION DATE: 201905242359
BLOOD PRODUCT EXPIRATION DATE: 201905262359
Blood Product Expiration Date: 201905142359
Blood Product Expiration Date: 201905262359
ISSUE DATE / TIME: 201905021324
ISSUE DATE / TIME: 201905021324
ISSUE DATE / TIME: 201905021353
ISSUE DATE / TIME: 201905021353
UNIT TYPE AND RH: 5100
Unit Type and Rh: 5100
Unit Type and Rh: 5100
Unit Type and Rh: 5100

## 2017-12-08 LAB — TYPE AND SCREEN
ABO/RH(D): O POS
ANTIBODY SCREEN: NEGATIVE
UNIT DIVISION: 0
UNIT DIVISION: 0
Unit division: 0
Unit division: 0

## 2017-12-08 LAB — CBC
HCT: 23.5 % — ABNORMAL LOW (ref 36.0–46.0)
Hemoglobin: 7.3 g/dL — ABNORMAL LOW (ref 12.0–15.0)
MCH: 29.8 pg (ref 26.0–34.0)
MCHC: 31.1 g/dL (ref 30.0–36.0)
MCV: 95.9 fL (ref 78.0–100.0)
Platelets: 193 10*3/uL (ref 150–400)
RBC: 2.45 MIL/uL — ABNORMAL LOW (ref 3.87–5.11)
RDW: 14.8 % (ref 11.5–15.5)
WBC: 9.8 10*3/uL (ref 4.0–10.5)

## 2017-12-08 LAB — BASIC METABOLIC PANEL
Anion gap: 8 (ref 5–15)
BUN: 7 mg/dL (ref 6–20)
CO2: 36 mmol/L — AB (ref 22–32)
Calcium: 7.6 mg/dL — ABNORMAL LOW (ref 8.9–10.3)
Chloride: 100 mmol/L — ABNORMAL LOW (ref 101–111)
Creatinine, Ser: 0.61 mg/dL (ref 0.44–1.00)
GFR calc Af Amer: >60 mL/min (ref >60)
GLUCOSE: 122 mg/dL — AB (ref 65–99)
Potassium: 2.8 mmol/L — ABNORMAL LOW (ref 3.5–5.1)
Sodium: 144 mmol/L (ref 135–145)

## 2017-12-08 LAB — MAGNESIUM: MAGNESIUM: 1.8 mg/dL (ref 1.7–2.4)

## 2017-12-08 MED ORDER — POTASSIUM CHLORIDE CRYS ER 20 MEQ PO TBCR
40.0000 meq | EXTENDED_RELEASE_TABLET | Freq: Three times a day (TID) | ORAL | Status: DC
  Administered 2017-12-08 – 2017-12-11 (×9): 40 meq via ORAL
  Filled 2017-12-08 (×9): qty 2

## 2017-12-08 MED ORDER — ENOXAPARIN SODIUM 40 MG/0.4ML ~~LOC~~ SOLN
40.0000 mg | SUBCUTANEOUS | Status: DC
  Administered 2017-12-09 – 2017-12-11 (×3): 40 mg via SUBCUTANEOUS
  Filled 2017-12-08 (×3): qty 0.4

## 2017-12-08 MED ORDER — ENOXAPARIN SODIUM 40 MG/0.4ML ~~LOC~~ SOLN: 40.0000 mg | SUBCUTANEOUS | Status: DC

## 2017-12-08 MED ORDER — POTASSIUM PHOSPHATES 15 MMOLE/5ML IV SOLN
30.0000 mmol | Freq: Once | INTRAVENOUS | Status: AC
  Administered 2017-12-08: 30 mmol via INTRAVENOUS
  Filled 2017-12-08: qty 10

## 2017-12-08 NOTE — Progress Notes (Signed)
4 Days Post-Op   Subjective/Chief Complaint: Mental status improved.  Sore, but fentanyl/oxy controlling pain.  No n/v.  Had a stool last night.    Objective: Vital signs in last 24 hours: Temp:  [97.6 F (36.4 C)-99 F (37.2 C)] 99 F (37.2 C) (05/06 1408) Pulse Rate:  [86-110] 107 (05/06 1408) Resp:  [16-20] 20 (05/06 1408) BP: (98-122)/(50-67) 119/57 (05/06 1408) SpO2:  [94 %-99 %] 94 % (05/06 1408) Last BM Date: 12/07/17  Intake/Output from previous day: 05/05 0701 - 05/06 0700 In: 580 [P.O.:240; I.V.:140; IV Piggyback:200] Out: 8882 [Urine:1750; Drains:80] Intake/Output this shift: Total I/O In: 582 [P.O.:582] Out: 2210 [Urine:2100; Drains:110]  General appearance: alert, cooperative and mild distress Resp: breathing comfortably Cardio: regular rate and rhythm GI: soft, non distended, dressing c/d/i.  drain serosang. Some serous leakage around drain site, non bilious. Extremities: extremities normal, atraumatic, no cyanosis or edema  Lab Results:  Recent Labs    12/07/17 0537 12/08/17 0427  WBC 13.5* 9.8  HGB 7.9* 7.3*  HCT 25.9* 23.5*  PLT 157 193   BMET Recent Labs    12/07/17 0537 12/08/17 0427  NA 145 144  K 2.5* 2.8*  CL 104 100*  CO2 30 36*  GLUCOSE 135* 122*  BUN 11 7  CREATININE 0.66 0.61  CALCIUM 7.5* 7.6*   PT/INR No results for input(s): LABPROT, INR in the last 72 hours. ABG No results for input(s): PHART, HCO3 in the last 72 hours.  Invalid input(s): PCO2, PO2  Studies/Results: No results found.  Anti-infectives: Anti-infectives (From admission, onward)   Start     Dose/Rate Route Frequency Ordered Stop   12/04/17 2200  ciprofloxacin (CIPRO) IVPB 400 mg     400 mg 200 mL/hr over 60 Minutes Intravenous Every 12 hours 12/04/17 1624 12/04/17 2250   12/04/17 0825  ciprofloxacin (CIPRO) IVPB 400 mg     400 mg 200 mL/hr over 60 Minutes Intravenous On call to O.R. 12/04/17 0825 12/04/17 1145      Assessment/Plan: s/p  Procedure(s): LAPAROSCOPIC PARTIAL HEPATECTOMY (N/A)  ABL anemia - recheck in AM.  Has dropped more on lovenox.  Recheck.   Hypotension - resolved. Hypokalemia - aggressive repletion.  Increase po K to TID.  Acute kidney injury - resolved Leukocytosis - resolved Deconditioning - OOB, possible PT    LOS: 4 days    [name] 12/08/2017

## 2017-12-08 NOTE — Plan of Care (Signed)
  Problem: Pain Managment: Goal: General experience of comfort will improve Outcome: Progressing   

## 2017-12-08 NOTE — Care Management Important Message (Signed)
Important Message  Patient Details  Name: Lisa Hamilton MRN: [id number] Date of Birth: [birth year]   Medicare Important Message Given:  Yes    Orbie Pyo 12/08/2017, 11:55 AM

## 2017-12-09 LAB — CBC
HEMATOCRIT: 23.8 % — AB (ref 36.0–46.0)
HEMOGLOBIN: 7.2 g/dL — AB (ref 12.0–15.0)
MCH: 29.5 pg (ref 26.0–34.0)
MCHC: 30.3 g/dL (ref 30.0–36.0)
MCV: 97.5 fL (ref 78.0–100.0)
Platelets: 235 10*3/uL (ref 150–400)
RBC: 2.44 MIL/uL — ABNORMAL LOW (ref 3.87–5.11)
RDW: 14.8 % (ref 11.5–15.5)
WBC: 7.7 10*3/uL (ref 4.0–10.5)

## 2017-12-09 LAB — BASIC METABOLIC PANEL
ANION GAP: 5 (ref 5–15)
BUN: 6 mg/dL (ref 6–20)
CHLORIDE: 96 mmol/L — AB (ref 101–111)
CO2: 43 mmol/L — AB (ref 22–32)
Calcium: 8 mg/dL — ABNORMAL LOW (ref 8.9–10.3)
Creatinine, Ser: 0.56 mg/dL (ref 0.44–1.00)
GFR calc Af Amer: >60 mL/min (ref >60)
GFR calc non Af Amer: >60 mL/min (ref >60)
GLUCOSE: 150 mg/dL — AB (ref 65–99)
POTASSIUM: 3.2 mmol/L — AB (ref 3.5–5.1)
Sodium: 144 mmol/L (ref 135–145)

## 2017-12-09 LAB — MAGNESIUM: Magnesium: 1.5 mg/dL — ABNORMAL LOW (ref 1.7–2.4)

## 2017-12-09 LAB — PHOSPHORUS: Phosphorus: 2.2 mg/dL — ABNORMAL LOW (ref 2.5–4.6)

## 2017-12-09 MED ORDER — MAGNESIUM SULFATE 2 GM/50ML IV SOLN
2.0000 g | Freq: Once | INTRAVENOUS | Status: AC
  Administered 2017-12-09: 2 g via INTRAVENOUS
  Filled 2017-12-09: qty 50

## 2017-12-09 MED ORDER — POTASSIUM CHLORIDE 10 MEQ/100ML IV SOLN
10.0000 meq | INTRAVENOUS | Status: AC
  Administered 2017-12-09 (×3): 10 meq via INTRAVENOUS
  Filled 2017-12-09 (×3): qty 100

## 2017-12-09 MED ORDER — FUROSEMIDE 40 MG PO TABS
40.0000 mg | ORAL_TABLET | Freq: Two times a day (BID) | ORAL | Status: DC
  Filled 2017-12-09: qty 1

## 2017-12-09 MED ORDER — POTASSIUM CHLORIDE 10 MEQ/100ML IV SOLN
10.0000 meq | INTRAVENOUS | Status: DC
  Administered 2017-12-09: 10 meq via INTRAVENOUS
  Filled 2017-12-09: qty 100

## 2017-12-09 NOTE — Plan of Care (Signed)
  Problem: Health Behavior/Discharge Planning: Goal: Ability to manage health-related needs will improve Outcome: Progressing   Problem: Clinical Measurements: Goal: Ability to maintain clinical measurements within normal limits will improve Outcome: Progressing Goal: Will remain free from infection Outcome: Progressing   

## 2017-12-09 NOTE — Progress Notes (Signed)
5 Days Post-Op   Subjective/Chief Complaint: Doing better with walking.    Objective: Vital signs in last 24 hours: Temp:  [98.4 F (36.9 C)-99 F (37.2 C)] 98.4 F (36.9 C) (05/07 0452) Pulse Rate:  [92-113] 92 (05/07 0452) Resp:  [17-20] 17 (05/07 0452) BP: (105-119)/(56-59) 105/56 (05/07 0452) SpO2:  [94 %-99 %] 97 % (05/07 0912) Last BM Date: 12/08/17  Intake/Output from previous day: 05/06 0701 - 05/07 0700 In: 2993 [P.O.:924; I.V.:120; IV Piggyback:510] Out: 6010 [Urine:5800; Drains:210] Intake/Output this shift: Total I/O In: -  Out: 895 [Urine:350; Drains:545]    General appearance:was OOB walking with staff and walker. Resp: a bit labored while ambulatory. Cardio: regular rate and rhythm GI: soft, non distended, dressing c/d/i.  drain serosang. Some serous leakage around drain site, non bilious. Extremities: extremities normal, atraumatic, no cyanosis or edema  Lab Results:  Recent Labs    12/08/17 0427 12/09/17 0755  WBC 9.8 7.7  HGB 7.3* 7.2*  HCT 23.5* 23.8*  PLT 193 235   BMET Recent Labs    12/08/17 0427 12/09/17 0755  NA 144 144  K 2.8* 3.2*  CL 100* 96*  CO2 36* 43*  GLUCOSE 122* 150*  BUN 7 6  CREATININE 0.61 0.56  CALCIUM 7.6* 8.0*   PT/INR No results for input(s): LABPROT, INR in the last 72 hours. ABG No results for input(s): PHART, HCO3 in the last 72 hours.  Invalid input(s): PCO2, PO2  Studies/Results: No results found.  Anti-infectives: Anti-infectives (From admission, onward)   Start     Dose/Rate Route Frequency Ordered Stop   12/04/17 2200  ciprofloxacin (CIPRO) IVPB 400 mg     400 mg 200 mL/hr over 60 Minutes Intravenous Every 12 hours 12/04/17 1624 12/04/17 2250   12/04/17 0825  ciprofloxacin (CIPRO) IVPB 400 mg     400 mg 200 mL/hr over 60 Minutes Intravenous On call to O.R. 12/04/17 0825 12/04/17 1145      Assessment/Plan: s/p Procedure(s): LAPAROSCOPIC PARTIAL HEPATECTOMY (N/A)  ABL anemia -  Stable  Hypotension - resolved. Hypokalemia - aggressive repletion.  TID KCl, more IV repletion.   Hypomagnesemia- replete Hypophosphatemia- replete  Acute kidney injury - resolved Leukocytosis - resolved Deconditioning - OOB, possible PT May need home O2 for asthma   LOS: 5 days    [name] 12/09/2017

## 2017-12-10 LAB — BASIC METABOLIC PANEL
Anion gap: 6 (ref 5–15)
BUN: 7 mg/dL (ref 6–20)
CALCIUM: 8.3 mg/dL — AB (ref 8.9–10.3)
CO2: 40 mmol/L — ABNORMAL HIGH (ref 22–32)
CREATININE: 0.59 mg/dL (ref 0.44–1.00)
Chloride: 97 mmol/L — ABNORMAL LOW (ref 101–111)
GFR calc Af Amer: >60 mL/min (ref >60)
GLUCOSE: 119 mg/dL — AB (ref 65–99)
POTASSIUM: 4.4 mmol/L (ref 3.5–5.1)
SODIUM: 143 mmol/L (ref 135–145)

## 2017-12-10 LAB — CBC
HCT: 25.4 % — ABNORMAL LOW (ref 36.0–46.0)
Hemoglobin: 7.6 g/dL — ABNORMAL LOW (ref 12.0–15.0)
MCH: 29.2 pg (ref 26.0–34.0)
MCHC: 29.9 g/dL — AB (ref 30.0–36.0)
MCV: 97.7 fL (ref 78.0–100.0)
PLATELETS: 285 10*3/uL (ref 150–400)
RBC: 2.6 MIL/uL — ABNORMAL LOW (ref 3.87–5.11)
RDW: 14.9 % (ref 11.5–15.5)
WBC: 7.7 10*3/uL (ref 4.0–10.5)

## 2017-12-10 LAB — MAGNESIUM: MAGNESIUM: 2.1 mg/dL (ref 1.7–2.4)

## 2017-12-10 LAB — PHOSPHORUS: PHOSPHORUS: 2.7 mg/dL (ref 2.5–4.6)

## 2017-12-10 MED ORDER — FUROSEMIDE 40 MG PO TABS
60.0000 mg | ORAL_TABLET | Freq: Two times a day (BID) | ORAL | Status: DC
  Administered 2017-12-10: 60 mg via ORAL
  Filled 2017-12-10 (×2): qty 1

## 2017-12-10 NOTE — Care Management Note (Signed)
Case Management Note  Patient Details  Name: Lisa Hamilton MRN: [id number] Date of Birth: 1945-10-26  Subjective/Objective:                    Action/Plan: Discussed potential discharge for tomorrow with patient. Patient confirmed face sheet information. Explained PT/OT will work with her and make recommendations. Patient voiced understanding . Patient requesting walker and 3 in 1 for home. Same ordered.  Also discussed possible home oxygen. Explained NT/RN/PT will do qualification ambulatory oxygen saturation note. If patient needs home oxygen [facility] will deliver portable oxygen tank to hospital room before discharge and concentrator to home. Patient has transportation home ( husband) .     If needed patient would like home health services through [facility]. ( she gets her NEB medicine through [facility]).  Expected Discharge Date:                  Expected Discharge Plan:  [place]  In-House Referral:     Discharge planning Services  CM Consult  Post Acute Care Choice:  Durable Medical Equipment, Home Health Choice offered to:  Patient  DME Arranged:  3-N-1, Walker rolling DME Agency:  [place]:    [place] Agency:  [place]  Status of Service:  In process, will continue to follow  If discussed at Long Length of Stay Meetings, dates discussed:    Additional Comments:  Marilu Favre, RN 12/10/2017, 10:18 AM

## 2017-12-10 NOTE — Progress Notes (Signed)
Patient up in hall walking with Mobility tech this AM.  She was up on Room air sat. Was 93% while sitting, while walking she was 88%.  When she returned to her room and sat down she desated to 85%, but recovered on her own to 93% in 1.5 minutes on her own, with out supplement oxygen supply. Pt does have her Incentive spirometer at bedside and is using every hour while awake.

## 2017-12-10 NOTE — Evaluation (Signed)
Occupational Therapy Evaluation Patient Details Name: Lisa Hamilton MRN: [id number] DOB: [birth year] Today's Date: 12/10/2017    History of Present Illness Pt is a 72 y/o female s/p laparascopic partial hepatectomy on 5/2 secondary to liver cyst. PMH includes asthma, COPD, and HTN.    Clinical Impression   PTA, pt was independent with ADL and functional mobility. She currently is limited by decreased activity tolerance for ADL and decreased safety awareness. Pt requiring min guard assist for shower transfers and supervision for LB ADL and toilet transfers. Educated pt concerning compensatory strategies for LB ADL to maximize independence. She verbalized understanding. Pt requiring cues to maintain safety and to wear her supplemental O2 during toileting tasks today. Facilitated improved understanding of energy conservation strategies with education, discussion, and handout provided and pt is able to verbalize strategies. Feel that pt would benefit from OT follow-up via home health services in order to maximize safety and independence in her home environment. No further acute OT needs identified and all further OT treatment and assessment will be deferred to home health OT. Pt has already been set-up to receive home health services.    Follow Up Recommendations  Home health OT;Supervision - Intermittent    Equipment Recommendations  None recommended by OT    Recommendations for Other Services       Precautions / Restrictions Precautions Precautions: Fall;Other (comment) Precaution Comments: Watch O2 sats  Restrictions Weight Bearing Restrictions: No      Mobility Bed Mobility               General bed mobility comments: OOB in bathroom on my arrival.   Transfers Overall transfer level: Needs assistance Equipment used: Rolling walker (2 wheeled) Transfers: Sit to/from Stand Sit to Stand: Supervision         General transfer comment: Supervision for safety. Cues to wear  socks when ambulating.     Balance Overall balance assessment: Needs assistance Sitting-balance support: No upper extremity supported;Feet supported Sitting balance-Leahy Scale: Good     Standing balance support: Bilateral upper extremity supported;During functional activity Standing balance-Leahy Scale: Fair Standing balance comment: Able to stand without UE support for static tasks.                            ADL either performed or assessed with clinical judgement   ADL Overall ADL's : Needs assistance/impaired Eating/Feeding: Modified independent   Grooming: Supervision/safety;Standing   Upper Body Bathing: Set up;Sitting   Lower Body Bathing: Supervison/ safety;Sit to/from stand   Upper Body Dressing : Set up;Sitting   Lower Body Dressing: Supervision/safety;Sit to/from stand   Toilet Transfer: Supervision/safety;Ambulation;RW;Comfort height toilet   Toileting- Clothing Manipulation and Hygiene: Supervision/safety;Sit to/from stand   Tub/ Shower Transfer: Min guard;Walk-in shower;Ambulation;Rolling walker;Shower seat   Functional mobility during ADLs: Supervision/safety General ADL Comments: Able to complete basic ADL and ADL transfers today with supervision to min guard assist. Educated pt on methods to don/doff LB clothing to minimize incisional strain and she demonstrates understanding. Pt does present with limited activity tolerance for ADL and spent significant time educating pt concerning energy conservation strategies to apply during daily routine and she demonstrates understanding. Provided handout for reinforcement.      Vision Baseline Vision/History: Macular Degeneration Patient Visual Report: No change from baseline Vision Assessment?: No apparent visual deficits Additional Comments: Age-related macular degeneration. She verbalizes good understanding of low vision compensatory strategies.      Perception  Praxis      Pertinent  Vitals/Pain Pain Assessment: Faces Faces Pain Scale: Hurts little more Pain Location: R side/abdomen when she coughs only Pain Descriptors / Indicators: Discomfort;Sore Pain Intervention(s): Limited activity within patient's tolerance;Monitored during session;Repositioned     Hand Dominance     Extremity/Trunk Assessment Upper Extremity Assessment Upper Extremity Assessment: Overall WFL for tasks assessed   Lower Extremity Assessment Lower Extremity Assessment: Generalized weakness   Cervical / Trunk Assessment Cervical / Trunk Assessment: Other exceptions Cervical / Trunk Exceptions: Pt reports scoliosis    Communication Communication Communication: No difficulties   Cognition Arousal/Alertness: Awake/alert Behavior During Therapy: WFL for tasks assessed/performed Overall Cognitive Status: Within Functional Limits for tasks assessed                                     General Comments  Pt in bathroom on my arrival with supplemental O2 removed. SpO2 85% on return to chair and educated pt on importance of maintaining O2 throughout daily activities. Returned 2L/min supplemental O2 via [place] with improvement to >90%.    Exercises     Shoulder Instructions      Home Living Family/patient expects to be discharged to:: Private residence Living Arrangements: Spouse/significant other Available Help at Discharge: Family;Available 24 hours/day Type of Home: House Home Access: Stairs to enter [employer] of Steps: 4 Entrance Stairs-Rails: Right Home Layout: One level     Bathroom Shower/Tub: Occupational psychologist: Handicapped height     Home Equipment: Shower seat;Cane - single point;Grab bars - tub/shower          Prior Functioning/Environment Level of Independence: Independent with assistive device(s)        Comments: Used cane for mobility        OT Problem List: Cardiopulmonary status limiting activity;Decreased  strength;Decreased activity tolerance;Impaired balance (sitting and/or standing);Decreased knowledge of use of DME or AE;Decreased safety awareness      OT Treatment/Interventions:      OT Goals(Current goals can be found in the care plan section) Acute Rehab OT Goals Patient Stated Goal: to go home  OT Goal Formulation: With patient Time For Goal Achievement: 12/24/17 Potential to Achieve Goals: Good  OT Frequency:     Barriers to D/C:            Co-evaluation              AM-PAC PT "6 Clicks" Daily Activity     Outcome Measure Help from another person eating meals?: None Help from another person taking care of personal grooming?: A Little Help from another person toileting, which includes using toliet, bedpan, or urinal?: A Little Help from another person bathing (including washing, rinsing, drying)?: A Little Help from another person to put on and taking off regular upper body clothing?: A Little Help from another person to put on and taking off regular lower body clothing?: A Little 6 Click Score: 19   End of Session Equipment Utilized During Treatment: Rolling walker  Activity Tolerance: Patient tolerated treatment well Patient left: in chair;with call bell/phone within reach  OT Visit Diagnosis: Other abnormalities of gait and mobility (R26.89)                Time: 0973-5329 OT Time Calculation (min): 27 min Charges:  OT General Charges $OT Visit: 1 Visit OT Evaluation $OT Eval Moderate Complexity: 1 Mod OT Treatments $Self Care/Home Management :  8-22 mins G-Codes:     Norman Herrlich, MS OTR/L  Pager: [place] A Kunal Levario 12/10/2017, 5:36 PM

## 2017-12-10 NOTE — Care Management (Signed)
SATURATION QUALIFICATIONS: (This note is used to comply with regulatory documentation for home oxygen)  Patient Saturations on Room Air at Rest = 90%  Patient Saturations on Room Air while Ambulating = 88% on room air   Patient Saturations on 2 Liters of oxygen while Ambulating = 91%  Please briefly explain why patient needs home oxygen:COPD and asthma

## 2017-12-10 NOTE — Progress Notes (Addendum)
6 Days Post-Op   Subjective/Chief Complaint: Continuing to improve.  Has still required oxygen.  Is getting stir crazy.    Objective: Vital signs in last 24 hours: Temp:  [98 F (36.7 C)-98.6 F (37 C)] 98.1 F (36.7 C) (05/08 0444) Pulse Rate:  [90-95] 90 (05/08 0444) Resp:  [18-20] 18 (05/08 0444) BP: (91-114)/(52-63) 95/52 (05/08 0444) SpO2:  [91 %-100 %] 100 % (05/08 0444) Last BM Date: 12/08/17  Intake/Output from previous day: 05/07 0701 - 05/08 0700 In: 900 [P.O.:240; I.V.:260; IV Piggyback:400] Out: 1985 [Urine:350; Drains:1635] Intake/Output this shift: No intake/output data recorded.   General appearance:was OOB to bathroom.  Ambulation much more stable.   Resp: breathing comfortably off oxygen in bathroom. Cardio: regular rate and rhythm GI: soft, non distended, incision c/d/i. No erythema or drainage.  drain serosang.  Extremities: extremities normal, atraumatic, no cyanosis or edema  Lab Results:  Recent Labs    12/09/17 0755 12/10/17 0437  WBC 7.7 7.7  HGB 7.2* 7.6*  HCT 23.8* 25.4*  PLT 235 285   BMET Recent Labs    12/09/17 0755 12/10/17 0437  NA 144 143  K 3.2* 4.4  CL 96* 97*  CO2 43* 40*  GLUCOSE 150* 119*  BUN 6 7  CREATININE 0.56 0.59  CALCIUM 8.0* 8.3*   PT/INR No results for input(s): LABPROT, INR in the last 72 hours. ABG No results for input(s): PHART, HCO3 in the last 72 hours.  Invalid input(s): PCO2, PO2  Studies/Results: No results found.  Anti-infectives: Anti-infectives (From admission, onward)   Start     Dose/Rate Route Frequency Ordered Stop   12/04/17 2200  ciprofloxacin (CIPRO) IVPB 400 mg     400 mg 200 mL/hr over 60 Minutes Intravenous Every 12 hours 12/04/17 1624 12/04/17 2250   12/04/17 0825  ciprofloxacin (CIPRO) IVPB 400 mg     400 mg 200 mL/hr over 60 Minutes Intravenous On call to O.R. 12/04/17 0825 12/04/17 1145      Assessment/Plan: s/p Procedure(s): LAPAROSCOPIC PARTIAL HEPATECTOMY  (N/A)  ABL anemia - Stable  Hypotension - resolved. Hypokalemia - better today.  Increase lasix again.  Recheck tomorrow.     Hypomagnesemia- resolved, recheck tomorrow Hypophosphatemia- resolved, recheck tomorrow  Acute kidney injury - resolved Leukocytosis - resolved Deconditioning - OOB, possible PT May need home O2 for asthma D/c blake drain.  Non bilious.    Dispo:  Work toward d/c tomorrow.  eval whether home O2 req'd.     LOS: 6 days    [name] 12/10/2017

## 2017-12-10 NOTE — Evaluation (Signed)
Physical Therapy Evaluation Patient Details Name: Lisa Hamilton MRN: [id number] DOB: [birth year] Today's Date: 12/10/2017   History of Present Illness  Pt is a 72 y/o female s/p laparascopic partial hepatectomy on 5/2 secondary to liver cyst. PMH includes asthma, COPD, and HTN.   Clinical Impression  Pt admitted secondary to problem above with deficits below. Pt fatiguing easily and requiring standing rests X 3 during gait training. Requiring min guard to supervision for mobility with RW. Pt's oxygen sats ranging from 88%-93% on RA during ambulation. Upon seated rest, oxygen sats dropping to 85% on RA, and required increased time and pursed lip breathing to return to 93%. Will continue to follow acutely to maximize functional mobility independence and safety.     Follow Up Recommendations Home health PT;Supervision for mobility/OOB    Equipment Recommendations  Rolling walker with 5" wheels;3in1 (PT)    Recommendations for Other Services       Precautions / Restrictions Precautions Precautions: Fall;Other (comment) Precaution Comments: Watch O2 sats  Restrictions Weight Bearing Restrictions: No      Mobility  Bed Mobility               General bed mobility comments: In chair upon entry.   Transfers Overall transfer level: Needs assistance Equipment used: Rolling walker (2 wheeled) Transfers: Sit to/from Stand Sit to Stand: Min guard         General transfer comment: Min guard for safety. Verbal cues for safe hand placement.   Ambulation/Gait Ambulation/Gait assistance: Min guard;Supervision Ambulation Distance (Feet): 125 Feet Assistive device: Rolling walker (2 wheeled) Gait Pattern/deviations: Step-through pattern;Decreased stride length Gait velocity: Decreased  Gait velocity interpretation: <1.8 ft/sec, indicate of risk for recurrent falls General Gait Details: Slow, guarded gait. No overt LOB noted. Pt fatiguing easily and required standing rest X 3. Pt's  oxygen sats ranging from 88%-93% during ambulation on RA. Upon sitting, pt's sats decreasing to 85% on RA and required extended time to return to 92% on RA.   Stairs            Wheelchair Mobility    Modified Rankin (Stroke Patients Only)       Balance Overall balance assessment: Needs assistance Sitting-balance support: No upper extremity supported;Feet supported Sitting balance-Leahy Scale: Good     Standing balance support: Bilateral upper extremity supported;During functional activity Standing balance-Leahy Scale: Poor Standing balance comment: Reliant on BUE support.                              Pertinent Vitals/Pain Pain Assessment: No/denies pain    Home Living Family/patient expects to be discharged to:: Private residence Living Arrangements: Spouse/significant other Available Help at Discharge: Family;Available 24 hours/day Type of Home: House Home Access: Stairs to enter Entrance Stairs-Rails: Right Entrance Stairs-Number of Steps: 4 Home Layout: One level Home Equipment: Shower seat;Cane - single point;Grab bars - tub/shower      Prior Function Level of Independence: Independent with assistive device(s)         Comments: USes cane for ambulation      Hand Dominance        Extremity/Trunk Assessment   Upper Extremity Assessment Upper Extremity Assessment: Defer to OT evaluation    Lower Extremity Assessment Lower Extremity Assessment: Generalized weakness    Cervical / Trunk Assessment Cervical / Trunk Assessment: Other exceptions Cervical / Trunk Exceptions: Pt reports scoliosis   Communication   Communication: No difficulties  Cognition Arousal/Alertness:  Awake/alert Behavior During Therapy: WFL for tasks assessed/performed Overall Cognitive Status: Within Functional Limits for tasks assessed                                        General Comments      Exercises     Assessment/Plan    PT  Assessment Patient needs continued PT services  PT Problem List Decreased strength;Decreased mobility;Decreased activity tolerance;Decreased knowledge of use of DME;Cardiopulmonary status limiting activity       PT Treatment Interventions Gait training;DME instruction;Stair training;Functional mobility training;Therapeutic activities;Therapeutic exercise;Balance training;Patient/family education    PT Goals (Current goals can be found in the Care Plan section)  Acute Rehab PT Goals Patient Stated Goal: to go home  PT Goal Formulation: With patient Time For Goal Achievement: 12/24/17 Potential to Achieve Goals: Good    Frequency Min 3X/week   Barriers to discharge        Co-evaluation               AM-PAC PT "6 Clicks" Daily Activity  Outcome Measure Difficulty turning over in bed (including adjusting bedclothes, sheets and blankets)?: A Little Difficulty moving from lying on back to sitting on the side of the bed? : Unable Difficulty sitting down on and standing up from a chair with arms (e.g., wheelchair, bedside commode, etc,.)?: Unable Help needed moving to and from a bed to chair (including a wheelchair)?: A Little Help needed walking in hospital room?: A Little Help needed climbing 3-5 steps with a railing? : A Little 6 Click Score: 14    End of Session Equipment Utilized During Treatment: Gait belt Activity Tolerance: Patient limited by fatigue Patient left: in chair;with call bell/phone within reach Nurse Communication: Mobility status;Other (comment)(oxygen sats ) PT Visit Diagnosis: Other abnormalities of gait and mobility (R26.89)    Time: 4132-4401 PT Time Calculation (min) (ACUTE ONLY): 23 min   Charges:   PT Evaluation $PT Eval Moderate Complexity: 1 Mod PT Treatments $Gait Training: 8-22 mins   PT G Codes:        Leighton Ruff, PT, DPT  Acute Rehabilitation Services  Pager: [phone]   Rudean Hitt 12/10/2017, 11:09 AM

## 2017-12-11 LAB — CBC
HCT: 26.6 % — ABNORMAL LOW (ref 36.0–46.0)
Hemoglobin: 8.1 g/dL — ABNORMAL LOW (ref 12.0–15.0)
MCH: 29.3 pg (ref 26.0–34.0)
MCHC: 30.5 g/dL (ref 30.0–36.0)
MCV: 96.4 fL (ref 78.0–100.0)
Platelets: 364 10*3/uL (ref 150–400)
RBC: 2.76 MIL/uL — AB (ref 3.87–5.11)
RDW: 14.9 % (ref 11.5–15.5)
WBC: 10.6 10*3/uL — AB (ref 4.0–10.5)

## 2017-12-11 LAB — BASIC METABOLIC PANEL
Anion gap: 10 (ref 5–15)
BUN: 11 mg/dL (ref 6–20)
CALCIUM: 8.8 mg/dL — AB (ref 8.9–10.3)
CO2: 34 mmol/L — ABNORMAL HIGH (ref 22–32)
CREATININE: 0.6 mg/dL (ref 0.44–1.00)
Chloride: 98 mmol/L — ABNORMAL LOW (ref 101–111)
Glucose, Bld: 119 mg/dL — ABNORMAL HIGH (ref 65–99)
Potassium: 4.5 mmol/L (ref 3.5–5.1)
SODIUM: 142 mmol/L (ref 135–145)

## 2017-12-11 LAB — PHOSPHORUS: Phosphorus: 2.4 mg/dL — ABNORMAL LOW (ref 2.5–4.6)

## 2017-12-11 LAB — MAGNESIUM: MAGNESIUM: 2 mg/dL (ref 1.7–2.4)

## 2017-12-11 MED ORDER — METHOCARBAMOL 500 MG PO TABS: 1000.0000 mg | ORAL_TABLET | Freq: Three times a day (TID) | ORAL | 1 refills | Status: DC | PRN

## 2017-12-11 MED ORDER — POTASSIUM CHLORIDE CRYS ER 20 MEQ PO TBCR: 40.0000 meq | EXTENDED_RELEASE_TABLET | Freq: Three times a day (TID) | ORAL | 0 refills | Status: DC

## 2017-12-11 MED ORDER — OXYCODONE HCL 5 MG PO TABS: 5.0000 mg | ORAL_TABLET | ORAL | 0 refills | Status: DC | PRN

## 2017-12-11 NOTE — Care Management (Signed)
Post discharge. [facility] realized they are unable to provide home health PT due to patient's address. Spoke to patient on the phone.  Patient aware . Second choice [facility]. Home health arranged with [facility]. Referral accepted by [facility] with Alvis Lemmings.  Patient aware. Magdalen Spatz RN BSN

## 2017-12-11 NOTE — Discharge Summary (Signed)
Physician Discharge Summary  Patient ID: Lisa Hamilton MRN: [id number] DOB/AGE: [birth year] 72 y.o.  Admit date: 12/04/2017 Discharge date: 12/11/2017  Admission Diagnoses: Patient Active Problem List   Diagnosis Date Noted  . Hypomagnesemia 12/07/2017  . Status post laparoscopy 12/04/2017  . Hypokalemia 10/15/2017  . Nephrolithiasis 10/15/2017  . Bilateral lower extremity edema 02/21/2016  . Anemia 12/22/2015  . Hyperglycemia 01/04/2014  . NONTOXIC MULTINODULAR GOITER 08/06/2008  . HYPERCHOLESTEROLEMIA 08/06/2008  . RESTLESS LEG SYNDROME 08/06/2008  . Essential hypertension 08/06/2008  . Liver cyst s/p partial hepatectomy 12/04/2017 08/06/2008  . MEDULLARY SPONGE KIDNEY 08/06/2008  . VITAMIN D DEFICIENCY 10/21/2007  . Morbid obesity ([place]) 10/21/2007  . GLAUCOMA 10/21/2007  . Asthma in adult, mild persistent, uncomplicated 16/02/3709  . Osteoarthritis 10/21/2007  . Osteoporosis 10/21/2007    Discharge Diagnoses:  Principal Problem:   Liver cyst s/p partial hepatectomy 12/04/2017 Active Problems:   Morbid obesity ([place])   RESTLESS LEG SYNDROME   Essential hypertension   Asthma in adult, mild persistent, uncomplicated   Osteoarthritis   Anemia   Hypokalemia   Status post laparoscopy   Hypomagnesemia   Discharged Condition: stable  Hospital Course:  Pt is a 72 yo F admitted to the ICU following a lap converted to open partial hepatectomy.  She had intraoperative bleeding from her right hepatic vein and conversion to open was required to repair this.  She had around a 1 L EBL and required transfusion.  She was observed in the ICU for 2 nights.  She had to switch from morphine for pain control to dilaudid. Her BP remained a bit soft on POD 1 and she was given albumin boluses.  She has some mild acute kidney injury as well on POD 1, but this resolved.  Her CBC was followed, and she drifted her H&H down quite a bit, but this remained stable prior to d/c while on lovenox.  She required  foley reinsertion x 1 for urinary retention.  Diuretics were held until BP came up. She had significant hypokalemia and hypomagnesemia.  This worsened after lasix was started.  She was transferred to the floor on POD 3.  She had an episode of delirium and dilaudid was changed to fentanyl with resolution.  She was able to tolerate diet advance.  She had a stool on POD 4.  She was able to void after foley was removed the second time.  She had a blake drain that remained non bilious and was removed.  She was very diligent about working with therapy/nursing/aids with mobility and used her walker to get around.  She did get tired out, but pushed herself.    She has COPD at baseline, and was on the cusp of needing home O2.  She just barely had not qualified for it prior to admission, but could not keep sats above 88 while ambulating without oxygen.    She will be discharged to home on POD 7 with HH PT and HH O2.  We will see her back in 1 week for staple removal and 2 weeks for follow up with me.  Pathology was benign.     Consults: None  Significant Diagnostic Studies: labs: WBCs 7.7, HCT 25.4, K 4.4, Cr 0.59, Mg 2.1 and phos 2.7. LFTs trending down with normal T bili and last AST 81/ALT 140. Albumin 2.6  Treatments: surgery: see above  Discharge Exam: Blood pressure (!) 108/57, pulse 93, temperature 98.9 F (37.2 C), temperature source Oral, resp. rate 19, height 5' (  1.524 m), weight 96 kg (211 lb 10.3 oz), SpO2 97 %. General appearance: alert, cooperative and no distress Resp: breathing comfortably at rest, mild tachypnea while up and about Cardio: regular rate and rhythm GI: soft, non distended, drain site with some serosang drainage.  incision c/d/i.  no erythema or drainage. Extremities: warm, well perfused.  +1 edema  Disposition: Discharge disposition: 01-Home or Self Care       Discharge Instructions    Call MD for:  persistant nausea and vomiting   Complete by:  As directed     Call MD for:  redness, tenderness, or signs of infection (pain, swelling, redness, odor or green/yellow discharge around incision site)   Complete by:  As directed    Call MD for:  severe uncontrolled pain   Complete by:  As directed    Call MD for:  temperature >100.4   Complete by:  As directed    Change dressing (specify)   Complete by:  As directed    Change drain site as needed.   Diet - low sodium heart healthy   Complete by:  As directed    Increase activity slowly   Complete by:  As directed      Allergies as of 12/11/2017      Reactions   Penicillins Rash, Other (See Comments)   PATIENT HAS HAD A PCN REACTION WITH IMMEDIATE RASH, FACIAL/TONGUE/THROAT SWELLING, SOB, OR LIGHTHEADEDNESS WITH HYPOTENSION:  #  #  YES  #  #  Has patient had a PCN reaction causing severe rash involving mucus membranes or skin necrosis: No Has patient had a PCN reaction that required hospitalization: No Has patient had a PCN reaction occurring within the last 10 years: No If all of the above answers are "NO", then may proceed with Cephalosporin use.   Adhesive [tape] Rash, Other (See Comments)   Blisters The clear tape    Amoxicillin-pot Clavulanate Rash   Codeine Other (See Comments)   nightmares      Medication List    TAKE these medications   acetaminophen 500 MG tablet Commonly known as:  TYLENOL Take 1,000 mg by mouth 3 (three) times daily as needed for moderate pain or headache.   albuterol (2.5 MG/3ML) 0.083% nebulizer solution Commonly known as:  PROVENTIL USE 1 VIAL IN NEBULIZER 4 TIMES DAILY. Generic: VENTOLIN What changed:    how much to take  how to take this  when to take this  reasons to take this  additional instructions   aspirin EC 81 MG tablet Take 162 mg by mouth at bedtime.   betamethasone dipropionate 0.05 % cream Commonly known as:  DIPROLENE Apply 1 application topically See admin instructions. Apply to legs twice daily, alternate every 2 weeks with  triamcinolone   CALCIUM 600+D 600-800 MG-UNIT Tabs Generic drug:  Calcium Carb-Cholecalciferol Take 1 tablet by mouth 2 (two) times daily.   clonazePAM 0.5 MG tablet Commonly known as:  KLONOPIN 1-3 taken 30 minutes before bed What changed:    how much to take  how to take this  when to take this  additional instructions   Co-Enzyme Q-10 100 MG Caps Take 100 mg by mouth daily.   Fish Oil 1200 MG Caps Take 1,200 mg by mouth daily.   furosemide 40 MG tablet Commonly known as:  LASIX Take 1 tablet (40 mg total) by mouth 3 (three) times daily. What changed:    how much to take  when to take this  additional  instructions   gabapentin 300 MG capsule Commonly known as:  NEURONTIN TAKE 1 CAPSULE BY MOUTH 3  TIMES DAILY What changed:    how much to take  how to take this  when to take this   ibuprofen 200 MG tablet Commonly known as:  ADVIL,MOTRIN Take 400 mg by mouth 3 (three) times daily as needed for headache or moderate pain.   Krill Oil 500 MG Caps Take 500 mg by mouth daily.   losartan 100 MG tablet Commonly known as:  COZAAR Take 1 tablet (100 mg total) by mouth daily.   Magnesium 400 MG Caps Take 400 mg by mouth 2 (two) times daily.   meloxicam 15 MG tablet Commonly known as:  MOBIC Take 15 mg by mouth daily.   methocarbamol 500 MG tablet Commonly known as:  ROBAXIN Take 2 tablets (1,000 mg total) by mouth every 8 (eight) hours as needed for muscle spasms. What changed:  how much to take   montelukast 10 MG tablet Commonly known as:  SINGULAIR Take 1 tablet (10 mg total) by mouth at bedtime. What changed:  when to take this   MUCINEX 600 MG 12 hr tablet Generic drug:  guaiFENesin Take 600 mg by mouth 2 (two) times daily as needed (congestion).   multivitamin capsule Take 1 capsule by mouth daily. Iron free   oxyCODONE 5 MG immediate release tablet Commonly known as:  Oxy IR/ROXICODONE Take 1-2 tablets (5-10 mg total) by mouth every 4  (four) hours as needed for moderate pain, severe pain or breakthrough pain. What changed:    how much to take  when to take this  reasons to take this   polyethylene glycol packet Commonly known as:  MIRALAX / GLYCOLAX Take 17 g by mouth 3 (three) times daily as needed for moderate constipation.   potassium chloride SA 20 MEQ tablet Commonly known as:  K-DUR,KLOR-CON Take 2 tablets (40 mEq total) by mouth 3 (three) times daily. What changed:  when to take this   pravastatin 40 MG tablet Commonly known as:  PRAVACHOL Take 1 tablet (40 mg total) by mouth daily.   PRESERVISION AREDS 2 Caps Take 1 capsule by mouth 2 (two) times daily.   rOPINIRole 0.5 MG tablet Commonly known as:  REQUIP Up to 6 daily as needed What changed:    how much to take  how to take this  when to take this  additional instructions   SYSTANE BALANCE OP Place 1 drop into both eyes daily as needed (dry eyes).   traMADol 50 MG tablet Commonly known as:  ULTRAM Take 1 tablet (50 mg total) by mouth daily as needed. What changed:    when to take this  reasons to take this   triamcinolone cream 0.1 % Commonly known as:  KENALOG Apply 1 application topically 2 (two) times daily. What changed:    when to take this  additional instructions   Vitamin D 2000 units tablet Take 2,000 Units by mouth daily.            Durable Medical Equipment  (From admission, onward)        Start     Ordered   12/10/17 1126  For home use only DME oxygen  Once    Question Answer Comment  Mode or (Route) Nasal cannula   Liters per Minute 2   Frequency Continuous (stationary and portable oxygen unit needed)   Oxygen conserving device Yes   Oxygen delivery system Gas  12/10/17 1126   12/10/17 1018  For home use only DME Walker rolling  Once    Question:  Patient needs a walker to treat with the following condition  Answer:  Weakness   12/10/17 1017   12/10/17 1018  For home use only DME 3 n 1   Once     12/10/17 1017       Discharge Care Instructions  (From admission, onward)        Start     Ordered   12/11/17 0000  Change dressing (specify)    Comments:  Change drain site as needed.   12/11/17 0107     Follow-up Information    [name], MD Follow up in 2 week(s).   Specialty:  General Surgery Contact information: [address] [place] [address] [phone]        [facility] [place] Follow up in 1 week(s).   Specialty:  General Surgery Why:  staple removal Contact information: [address] [place]  [address] [phone]           Signed: [name] 12/11/2017, 1:19 AM

## 2017-12-11 NOTE — Progress Notes (Signed)
Physical Therapy Treatment and Discharge Patient Details Name: Lisa Hamilton MRN: [id number] DOB: 02/17/1946 Today's Date: 12/11/2017    History of Present Illness Pt is a 72 y/o female s/p laparascopic partial hepatectomy on 5/2 secondary to liver cyst. PMH includes asthma, COPD, and HTN.     PT Comments    At the time of PT eval pt was able to perform transfers and ambulation with gross modified independence to supervision for safety (for stairs). Per staff, pt has been ambulating in hall with family without difficulty. Pt was instructed in upright posture and gently extending trunk to neutral, to minimize tightening of tissue around staples. Family member present for education as well and was present for stair training. Pt has currently met all acute PT goals and anticipates d/c home today. Will sign off at this time. If needs change, please reconsult.    Follow Up Recommendations  Home health PT;Supervision for mobility/OOB     Equipment Recommendations  Rolling walker with 5" wheels;3in1 (PT)    Recommendations for Other Services       Precautions / Restrictions Precautions Precautions: Fall;Other (comment) Precaution Comments: Watch O2 sats  Restrictions Weight Bearing Restrictions: No    Mobility  Bed Mobility               General bed mobility comments: OOB standing in front of recliner with family member present upon arrival.   Transfers Overall transfer level: Modified independent Equipment used: Rolling walker (2 wheeled) Transfers: Sit to/from Stand           General transfer comment: Pt able to demonstrate sit<>stand several times throughout session without unsteadiness or LOB. VC's for hand placement on seated surface for safety.   Ambulation/Gait Ambulation/Gait assistance: Modified independent (Device/Increase time) Ambulation Distance (Feet): 100 Feet Assistive device: Rolling walker (2 wheeled) Gait Pattern/deviations: Step-through  pattern;Decreased stride length Gait velocity: Decreased  Gait velocity interpretation: 1.31 - 2.62 ft/sec, indicative of limited community ambulator General Gait Details: Slow but generally steady. No unsteadiness or LOB noted with RW for support. VC's for improved posture.    Stairs Stairs: Yes Stairs assistance: Min guard;Supervision Stair Management: One rail Right;Step to pattern;Forwards;Sideways Number of Stairs: 4 General stair comments: Pt was instructed in forwards negotiation of stairs with HHA on the L to simulate home environment, and sideways negotiation of stairs to descend. Pt felt more comfortable holding the rail with both hands for added support.    Wheelchair Mobility    Modified Rankin (Stroke Patients Only)       Balance Overall balance assessment: Needs assistance Sitting-balance support: No upper extremity supported;Feet supported Sitting balance-Leahy Scale: Good     Standing balance support: Bilateral upper extremity supported;During functional activity Standing balance-Leahy Scale: Fair Standing balance comment: Able to stand without UE support for static tasks.                             Cognition Arousal/Alertness: Awake/alert Behavior During Therapy: WFL for tasks assessed/performed Overall Cognitive Status: Impaired/Different from baseline Area of Impairment: Safety/judgement                         Safety/Judgement: Decreased awareness of safety     General Comments: Continues to demonstrate slightly decreased safety awareness      Exercises      General Comments        Pertinent Vitals/Pain Pain Assessment: Faces Faces Pain  Scale: Hurts little more Pain Location: R side/abdomen when she coughs only Pain Descriptors / Indicators: Discomfort;Sore Pain Intervention(s): Monitored during session    Home Living Family/patient expects to be discharged to:: Private residence Living Arrangements:  Spouse/significant other Available Help at Discharge: Family;Available 24 hours/day Type of Home: House Home Access: Stairs to enter Entrance Stairs-Rails: Right Home Layout: One level Home Equipment: Shower seat;Cane - single point;Grab bars - tub/shower      Prior Function Level of Independence: Independent with assistive device(s)      Comments: Used cane for mobility   PT Goals (current goals can now be found in the care plan section) Acute Rehab PT Goals Patient Stated Goal: to go home  PT Goal Formulation: With patient Time For Goal Achievement: 12/24/17 Potential to Achieve Goals: Good Progress towards PT goals: Progressing toward goals    Frequency    Min 3X/week      PT Plan Current plan remains appropriate    Co-evaluation              AM-PAC PT "6 Clicks" Daily Activity  Outcome Measure  Difficulty turning over in bed (including adjusting bedclothes, sheets and blankets)?: A Little Difficulty moving from lying on back to sitting on the side of the bed? : Unable Difficulty sitting down on and standing up from a chair with arms (e.g., wheelchair, bedside commode, etc,.)?: Unable Help needed moving to and from a bed to chair (including a wheelchair)?: A Little Help needed walking in hospital room?: A Little Help needed climbing 3-5 steps with a railing? : A Little 6 Click Score: 14    End of Session Equipment Utilized During Treatment: Gait belt Activity Tolerance: Patient limited by fatigue Patient left: in chair;with call bell/phone within reach Nurse Communication: Mobility status PT Visit Diagnosis: Other abnormalities of gait and mobility (R26.89)     Time: 1505-6979 PT Time Calculation (min) (ACUTE ONLY): 13 min  Charges:  $Gait Training: 8-22 mins                    G Codes:       Rolinda Roan, PT, DPT Acute Rehabilitation Services Pager: [place] 12/11/2017, 10:26 AM

## 2017-12-11 NOTE — Discharge Instructions (Signed)
[facility]      [facility] °[phone] ° °ABDOMINAL SURGERY: POST OP INSTRUCTIONS ° °Always review your discharge instruction sheet given to you by the facility where your surgery was performed. ° °IF YOU HAVE DISABILITY OR FAMILY LEAVE FORMS, YOU MUST BRING THEM TO THE OFFICE FOR PROCESSING.  PLEASE DO NOT GIVE THEM TO YOUR DOCTOR. ° °1. A prescription for pain medication may be given to you upon discharge.  Take your pain medication as prescribed, if needed.  If narcotic pain medicine is not needed, then you may take acetaminophen (Tylenol) or ibuprofen (Advil) as needed. °2. Take your usually prescribed medications unless otherwise directed. °3. If you need a refill on your pain medication, please contact your pharmacy. They will contact our office to request authorization.  Prescriptions will not be filled after 5pm or on week-ends. °4. You should follow a light diet the first few days after arrival home, such as soup and crackers, pudding, etc.unless your doctor has advised otherwise. A high-fiber, low fat diet can be resumed as tolerated.   Be sure to include lots of fluids daily. Most patients will experience some swelling and bruising on the chest and neck area.  Ice packs will help.  Swelling and bruising can take several days to resolve °5. Most patients will experience some swelling and bruising in the area of the incision. Ice pack will help. Swelling and bruising can take several days to resolve..  °6. It is common to experience some constipation if taking pain medication after surgery.  Increasing fluid intake and taking a stool softener will usually help or prevent this problem from occurring.  A mild laxative (Milk of Magnesia or Miralax) should be taken according to package directions if there are no bowel movements after 48 hours. °7.  You may have steri-strips (small skin tapes) in place directly over the incision.  These strips should be left on the skin for 10-14 days.  If your  surgeon used skin glue on the incision, you may shower in 48 hours.  The glue will flake off over the next 2-3 weeks.  Any sutures or staples will be removed at the office during your follow-up visit. You may find that a light gauze bandage over your incision may keep your staples from being rubbed or pulled. You may shower and replace the bandage daily. °8. ACTIVITIES:  You may resume regular (light) daily activities beginning the next day--such as daily self-care, walking, climbing stairs--gradually increasing activities as tolerated.  You may have sexual intercourse when it is comfortable.  Refrain from any heavy lifting or straining until approved by your doctor. °a. You may drive when you no longer are taking prescription pain medication, you can comfortably wear a seatbelt, and you can safely maneuver your car and apply brakes °b. Return to Work: __________8 weeks if applicable_________________________ °9. You should see your doctor in the office for a follow-up appointment approximately two weeks after your surgery.  Make sure that you call for this appointment within a day or two after you arrive home to insure a convenient appointment time. °OTHER INSTRUCTIONS:  °_____________________________________________________________ °_____________________________________________________________ ° °WHEN TO CALL YOUR DOCTOR: °1. Fever over 101.0 °2. Inability to urinate °3. Nausea and/or vomiting °4. Extreme swelling or bruising °5. Continued bleeding from incision. °6. Increased pain, redness, or drainage from the incision. °7. Difficulty swallowing or breathing °8. Muscle cramping or spasms. °9. Numbness or tingling in hands or feet or around lips. ° °The clinic staff is   available to answer your questions during regular business hours.  Please don’t hesitate to call and ask to speak to one of the nurses if you have concerns. ° °For further questions, please visit [email or web address] ° ° ° °

## 2017-12-15 DIAGNOSIS — Z48815 Encounter for surgical aftercare following surgery on the digestive system: Secondary | ICD-10-CM | POA: Diagnosis not present

## 2017-12-19 DIAGNOSIS — Z48815 Encounter for surgical aftercare following surgery on the digestive system: Secondary | ICD-10-CM | POA: Diagnosis not present

## 2017-12-22 DIAGNOSIS — Z48815 Encounter for surgical aftercare following surgery on the digestive system: Secondary | ICD-10-CM | POA: Diagnosis not present

## 2017-12-26 DIAGNOSIS — Z48815 Encounter for surgical aftercare following surgery on the digestive system: Secondary | ICD-10-CM | POA: Diagnosis not present

## 2017-12-29 DIAGNOSIS — Z48815 Encounter for surgical aftercare following surgery on the digestive system: Secondary | ICD-10-CM | POA: Diagnosis not present

## 2017-12-30 DIAGNOSIS — Z48815 Encounter for surgical aftercare following surgery on the digestive system: Secondary | ICD-10-CM | POA: Diagnosis not present

## 2017-12-30 DIAGNOSIS — M1991 Primary osteoarthritis, unspecified site: Secondary | ICD-10-CM | POA: Diagnosis not present

## 2017-12-30 DIAGNOSIS — M810 Age-related osteoporosis without current pathological fracture: Secondary | ICD-10-CM | POA: Diagnosis not present

## 2017-12-30 DIAGNOSIS — Z9981 Dependence on supplemental oxygen: Secondary | ICD-10-CM | POA: Diagnosis not present

## 2017-12-30 DIAGNOSIS — G2581 Restless legs syndrome: Secondary | ICD-10-CM | POA: Diagnosis not present

## 2017-12-30 DIAGNOSIS — J449 Chronic obstructive pulmonary disease, unspecified: Secondary | ICD-10-CM | POA: Diagnosis not present

## 2017-12-30 DIAGNOSIS — J4530 Mild persistent asthma, uncomplicated: Secondary | ICD-10-CM | POA: Diagnosis not present

## 2017-12-30 DIAGNOSIS — Z9181 History of falling: Secondary | ICD-10-CM

## 2017-12-30 DIAGNOSIS — Z9049 Acquired absence of other specified parts of digestive tract: Secondary | ICD-10-CM | POA: Diagnosis not present

## 2017-12-30 DIAGNOSIS — Z79891 Long term (current) use of opiate analgesic: Secondary | ICD-10-CM

## 2017-12-30 DIAGNOSIS — E7800 Pure hypercholesterolemia, unspecified: Secondary | ICD-10-CM | POA: Diagnosis not present

## 2017-12-30 DIAGNOSIS — E876 Hypokalemia: Secondary | ICD-10-CM | POA: Diagnosis not present

## 2018-01-02 DIAGNOSIS — N3000 Acute cystitis without hematuria: Secondary | ICD-10-CM | POA: Diagnosis not present

## 2018-01-02 DIAGNOSIS — B373 Candidiasis of vulva and vagina: Secondary | ICD-10-CM | POA: Diagnosis not present

## 2018-02-26 ENCOUNTER — Encounter: Payer: Self-pay | Admitting: Podiatry

## 2018-02-26 ENCOUNTER — Ambulatory Visit (INDEPENDENT_AMBULATORY_CARE_PROVIDER_SITE_OTHER): Payer: Medicare Other | Admitting: Podiatry

## 2018-02-26 DIAGNOSIS — Q828 Other specified congenital malformations of skin: Secondary | ICD-10-CM

## 2018-02-26 DIAGNOSIS — M722 Plantar fascial fibromatosis: Secondary | ICD-10-CM

## 2018-02-26 NOTE — Progress Notes (Signed)
Presents today for follow-up of painful plantar area to the left forefoot.  Objective: Vital signs are stable alert and oriented x3 she has pain on palpation medial calcaneal tubercle of bilateral heels pulses are palpable.  Assessment: Plantar fasciitis.  Plan: Injected bilateral heels today after sterile Betadine skin prep 20 mg Kenalog 5 mg Marcaine point maximal tenderness.  Tolerated procedure well without complications.

## 2018-03-02 ENCOUNTER — Other Ambulatory Visit (INDEPENDENT_AMBULATORY_CARE_PROVIDER_SITE_OTHER): Payer: Medicare Other

## 2018-03-02 ENCOUNTER — Ambulatory Visit (INDEPENDENT_AMBULATORY_CARE_PROVIDER_SITE_OTHER): Payer: Medicare Other | Admitting: Internal Medicine

## 2018-03-02 ENCOUNTER — Encounter: Payer: Self-pay | Admitting: Internal Medicine

## 2018-03-02 VITALS — BP 102/62 | HR 71 | Ht 60.0 in | Wt 202.4 lb

## 2018-03-02 DIAGNOSIS — J4530 Mild persistent asthma, uncomplicated: Secondary | ICD-10-CM

## 2018-03-02 DIAGNOSIS — G2581 Restless legs syndrome: Secondary | ICD-10-CM | POA: Diagnosis not present

## 2018-03-02 DIAGNOSIS — J9611 Chronic respiratory failure with hypoxia: Secondary | ICD-10-CM

## 2018-03-02 DIAGNOSIS — D509 Iron deficiency anemia, unspecified: Secondary | ICD-10-CM

## 2018-03-02 LAB — FERRITIN: Ferritin: 10 ng/mL (ref 10.0–291.0)

## 2018-03-02 NOTE — Patient Instructions (Addendum)
Order- Walk test O2 qualifying  Order- HC parking- permanent  Order- lab- CBC w dif, Ferritin     Dx iron deficiency anemia  Order schedule PFT   Dx asthma mild persistent uncomplicated  Please call as needed

## 2018-03-02 NOTE — Progress Notes (Signed)
HPI female never smoker followed for Restless Legs, back pain that disturbs sleep, asthma, complicated by peripheral venous disease, hemochromatosis  ----------------------------------------------------------------------  08/18/17- 72 year old female never smoker followed for Restless Legs, back pain that disturbs sleep, asthma, complicated by peripheral venous disease, hemochromatosis  ----asthma, ,RLS,follow up, feels asthma under control,RLS feels worse while riding  or sitting,feels legs jumpy,has to move around Theophylline, albuterol nebulizer, Singulair, Requip 0.5 mg (up to 5 tabs daily) Breathing "no problem".  Has a portable nebulizer and wants refill med for it.  Has not been needing Advair and does not know if theophylline does anything for her any longer.  Denies sleep disturbance from asthma. Restless legs if sits to travel.  Uses 5 Requip daily but would need more on days when she is driving back and forth to visit family.  Has been taking 1.5 clonazepam tablets per day but needs to increase bedtime dose so that she is taking total 2 tablets daily.  03/02/2018- 72 year old female never smoker followed for Restless Legs, back pain that disturbs sleep, asthma, complicated by peripheral venous disease, hemochromatosis  O2 2l Advanced sleep and prn  Using a self-fill system Theophylline, albuterol nebulizer, Singulair, Requip 0.5 mg (up to 5 tabs daily) -----She has [facility] in the hosp[facility]( liver cyst-partial hepatectomy)l in May. She has had surgery 2 times and has been put on oxygen 2l at night and sometimes during the day. Had partial bowel obst, repair inguinal hernia, then hepatic cyst resection. Using O2 during the day if increased exertion or poor air quality.  Wakes some days with chest tightness, responding to her neb treatments. Restless leg symptoms have been worse, possibly related to anemia. She is appropriate for [facility] parking.  CXR 12/04/2017- IMPRESSION: Probable mild  bibasilar subsegmental atelectasis.              ROS-see HPI + = positive Constitutional:   No-   weight loss, night sweats, fevers, chills, +fatigue, lassitude. HEENT:   No-  headaches, difficulty swallowing, tooth/dental problems, sore throat,       No-  sneezing, itching, ear ache, nasal congestion, post nasal drip,  CV:  No-   chest pain, orthopnea, PND, swelling in lower extremities, anasarca,                                                       dizziness, palpitations Resp: + shortness of breath with exertion or at rest.              No-   productive cough,  No non-productive cough,  No- coughing up of blood.              No-   change in color of mucus. + wheezing.   Skin: No-   rash or lesions. GI:  No-   heartburn, indigestion, abdominal pain, nausea, vomiting,  GU:     MS:  No-   joint pain or swelling.  No- decreased range of motion.  + back pain. Neuro-     + per HPI Psych:  No- change in mood or affect. No depression or anxiety.  No memory loss.  OBJ- Physical Exam General- Alert, Oriented, Affect-appropriate, Distress- none acute, +obese Skin- rash-none, lesions- none, excoriation- none Lymphadenopathy- none Head- atraumatic            Eyes-  Gross vision intact, PERRLA, conjunctivae and secretions clear            Ears- Hearing, canals-normal            Nose- Clear, no-Septal dev, mucus, polyps, erosion, perforation             Throat- Mallampati II-III , mucosa clear , drainage- none, tonsils- atrophic Neck- flexible , trachea midline, no stridor , thyroid nl, carotid no bruit Chest - symmetrical excursion , unlabored           Heart/CV- RRR , no murmur , no gallop  , no rub, nl s1 s2                           - JVD+2cm , edema +2-3, stasis changes- none,  superficial varices +           Lung- clear to P&A, wheeze- none, cough- none , dullness-none, rub- none           Chest wall-  Abd-  Br/ Gen/ Rectal- Not done, not indicated Extrem- cyanosis- none, clubbing, none,  atrophy- none, strength- nl, + walking boot left foot after surgery Neuro- grossly intact to observation, no unusual restlessness or movement, +cane

## 2018-03-04 DIAGNOSIS — J9611 Chronic respiratory failure with hypoxia: Secondary | ICD-10-CM | POA: Insufficient documentation

## 2018-03-04 NOTE — Assessment & Plan Note (Signed)
Restless legs symptoms worse since hosp. We discussed switch from Requip to Mirapex, since that helps a friend. I suspect iron deficiency from anemia and will check for that first. If iron is low, her pcp can treat and we will watch for improvement in dyspnea and RLS.

## 2018-03-04 NOTE — Assessment & Plan Note (Signed)
She continues to need and benefit from O2 for sleep and exertion. We will reassess on f/u.

## 2018-03-04 NOTE — Assessment & Plan Note (Signed)
Need to update assessment. Plan- PFT

## 2018-03-10 DIAGNOSIS — H43813 Vitreous degeneration, bilateral: Secondary | ICD-10-CM | POA: Diagnosis not present

## 2018-03-10 DIAGNOSIS — H353111 Nonexudative age-related macular degeneration, right eye, early dry stage: Secondary | ICD-10-CM | POA: Diagnosis not present

## 2018-03-10 DIAGNOSIS — H35363 Drusen (degenerative) of macula, bilateral: Secondary | ICD-10-CM | POA: Diagnosis not present

## 2018-03-10 DIAGNOSIS — H35371 Puckering of macula, right eye: Secondary | ICD-10-CM | POA: Diagnosis not present

## 2018-03-10 DIAGNOSIS — Z961 Presence of intraocular lens: Secondary | ICD-10-CM | POA: Diagnosis not present

## 2018-03-10 DIAGNOSIS — H5315 Visual distortions of shape and size: Secondary | ICD-10-CM | POA: Diagnosis not present

## 2018-03-10 DIAGNOSIS — H401132 Primary open-angle glaucoma, bilateral, moderate stage: Secondary | ICD-10-CM | POA: Diagnosis not present

## 2018-03-10 DIAGNOSIS — H35721 Serous detachment of retinal pigment epithelium, right eye: Secondary | ICD-10-CM | POA: Diagnosis not present

## 2018-03-12 ENCOUNTER — Telehealth: Payer: Self-pay | Admitting: Internal Medicine

## 2018-03-12 ENCOUNTER — Other Ambulatory Visit: Payer: Self-pay | Admitting: Internal Medicine

## 2018-03-12 NOTE — Telephone Encounter (Signed)
Called patient unable to reach left message to give us a call back.

## 2018-03-13 NOTE — Telephone Encounter (Signed)
  ATC pt, no answer. Left message for pt to call back.    Notes recorded by Deneise Lever, MD on 03/03/2018 at 10:49 AM EDT Lab- Ferritin level is at lower end of normal, meaning iron stores are low. Low iron levels may be the reason that restless leg problem is worse. Recommend she talk with her PCP about a trial of iron replacement.

## 2018-03-13 NOTE — Telephone Encounter (Signed)
Pt is returning call. Cb is [phone].

## 2018-03-16 NOTE — Telephone Encounter (Signed)
lmtcb for pt.  

## 2018-03-16 NOTE — Telephone Encounter (Signed)
Patient is returning call. CB is [phone] or [phone].

## 2018-03-16 NOTE — Telephone Encounter (Signed)
Left detailed message relaying below results/recs.  Nothing further needed at this time.

## 2018-03-16 NOTE — Telephone Encounter (Signed)
CY Please advise. Thanks.

## 2018-03-16 NOTE — Telephone Encounter (Signed)
Ok to refill each for total 6 months 

## 2018-03-16 NOTE — Telephone Encounter (Signed)
Pt is calling back [phone]  or [phone] Please leave a detailed message.

## 2018-03-17 ENCOUNTER — Telehealth: Payer: Self-pay | Admitting: Internal Medicine

## 2018-03-17 NOTE — Telephone Encounter (Signed)
Patient informed of MD response and stated understanding  

## 2018-03-17 NOTE — Telephone Encounter (Signed)
Spoke with Lisa Hamilton at [facility] to relay lab results again.  She was on other line with pt, will relay results to pt.  Nothing further needed.

## 2018-03-17 NOTE — Telephone Encounter (Signed)
Levada Dy - nurse with [facility] called states that she is trying to help Ms.Czerniak get the lab results - advised pt should have vm with lab results - pt states she did not get this info - Levada Dy is requesting a call back at [phone] -pr

## 2018-03-17 NOTE — Telephone Encounter (Signed)
Copied from [place] [phone]. Topic: Quick Communication - See Telephone Encounter >> Mar 17, 2018 11:51 AM Rutherford Nail, NT wrote: CRM for notification. See Telephone encounter for: 03/17/18. Patient calling and states that she had an appointment with her pulmonary doctor and had a ferratin level check. Dr Annamaria Boots (pulmonary) wanted her to have Dr Sharlet Salina look at the results to see if she wanted to start her on an iron supplement. Please advise.  CB#: [phone]

## 2018-03-17 NOTE — Telephone Encounter (Signed)
lmtcb for pt. Results were given on detailed voicemail yesterday- see previous phone note.

## 2018-03-17 NOTE — Telephone Encounter (Signed)
I would recommend to try 1 pill iron gluconate daily, and increase diet with iron rich foods.

## 2018-05-06 DIAGNOSIS — Z23 Encounter for immunization: Secondary | ICD-10-CM | POA: Diagnosis not present

## 2018-05-12 DIAGNOSIS — M419 Scoliosis, unspecified: Secondary | ICD-10-CM | POA: Diagnosis not present

## 2018-05-12 DIAGNOSIS — M545 Low back pain: Secondary | ICD-10-CM | POA: Diagnosis not present

## 2018-05-12 DIAGNOSIS — M5136 Other intervertebral disc degeneration, lumbar region: Secondary | ICD-10-CM | POA: Diagnosis not present

## 2018-05-12 DIAGNOSIS — M4310 Spondylolisthesis, site unspecified: Secondary | ICD-10-CM | POA: Diagnosis not present

## 2018-05-12 IMAGING — DX DG ABD PORTABLE 1V
1 series · 1 of 1 positions shown · non-contrast
Comparison: 10/15/2017

CLINICAL DATA: Small-bowel obstruction

EXAM:
PORTABLE ABDOMEN - 1 VIEW

[abdomen kub]
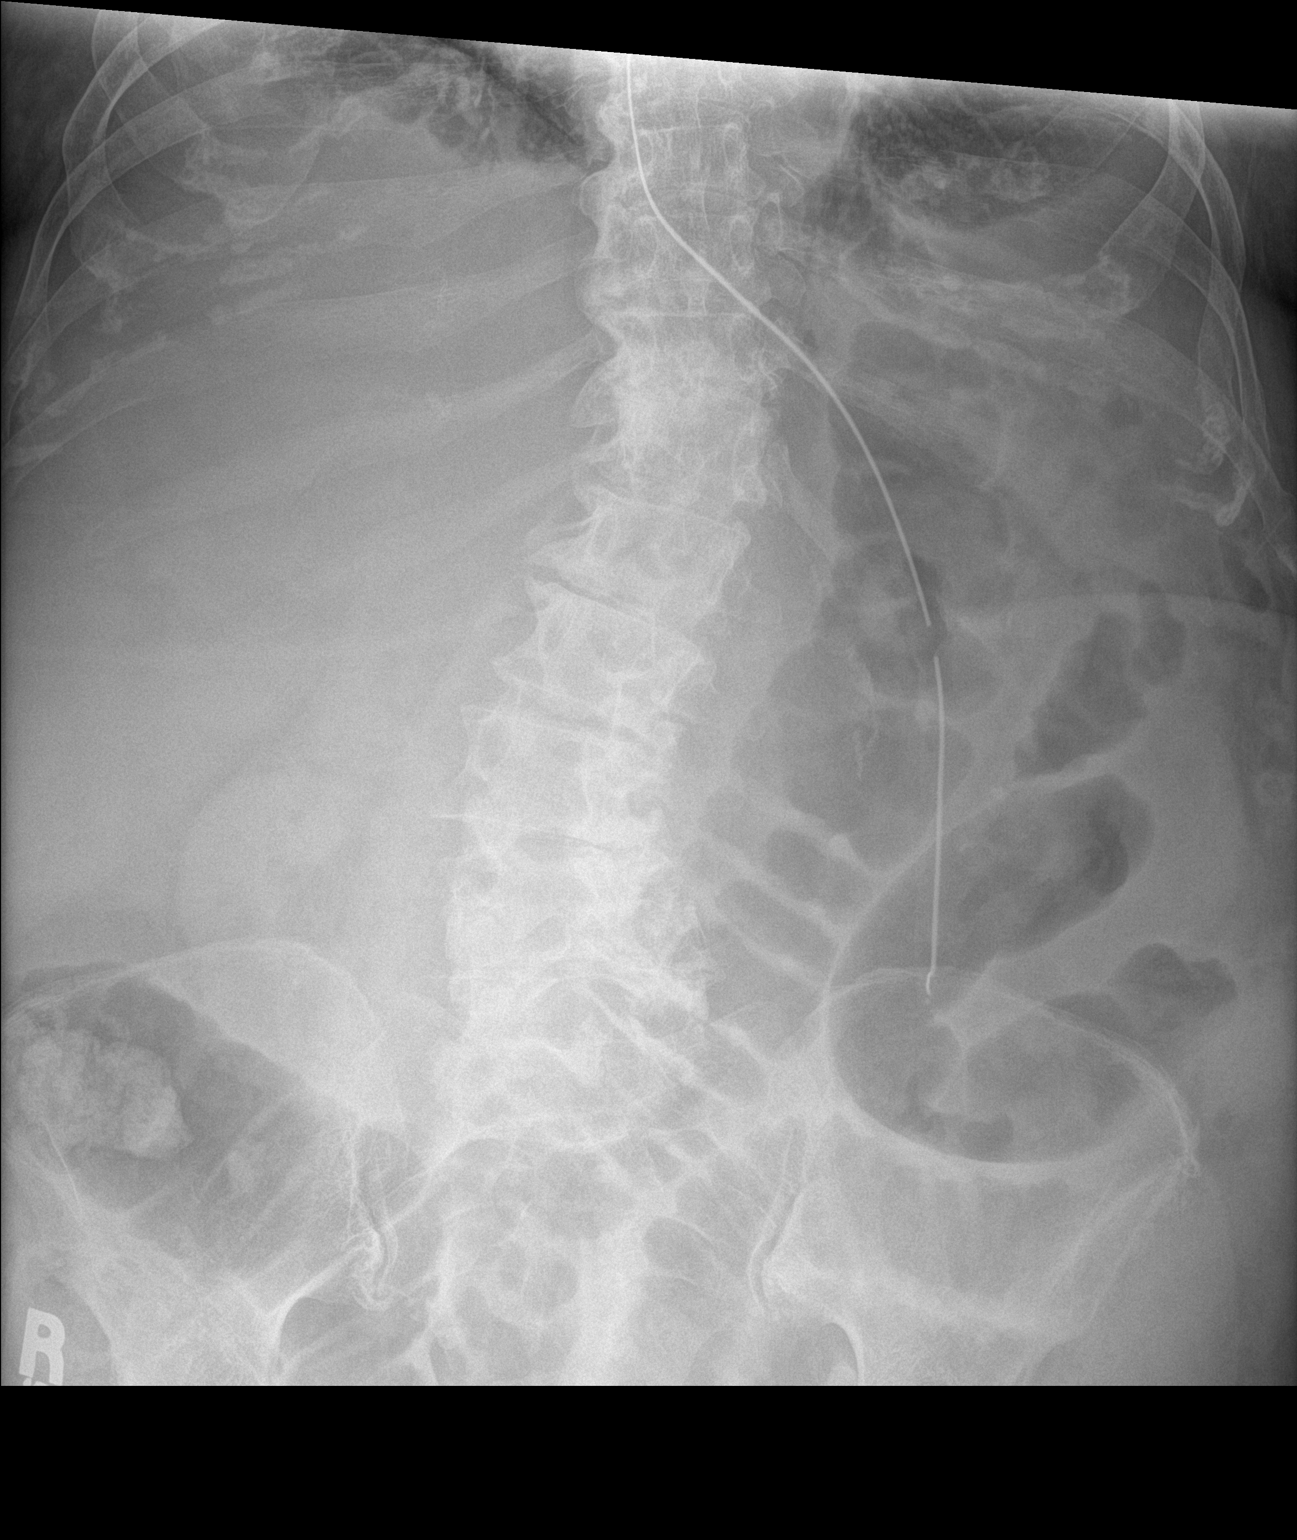

[1 of 1 positions shown; findings below may reference images not displayed]

FINDINGS: NG tube in the stomach with the tip in the body the stomach. Mild
large and small bowel distention compatible with ileus.
IMPRESSION: NG in the stomach

Persistent ileus

## 2018-05-12 IMAGING — DX DG ABD PORTABLE 1V
2 series · 2 of 2 positions shown · non-contrast
Comparison: Abdominal CT dated 10/15/2017

CLINICAL DATA: 71-year-old female status post enteric tube
placement.

EXAM:
PORTABLE ABDOMEN - 1 VIEW

[abdomen kub (1 of 2)]
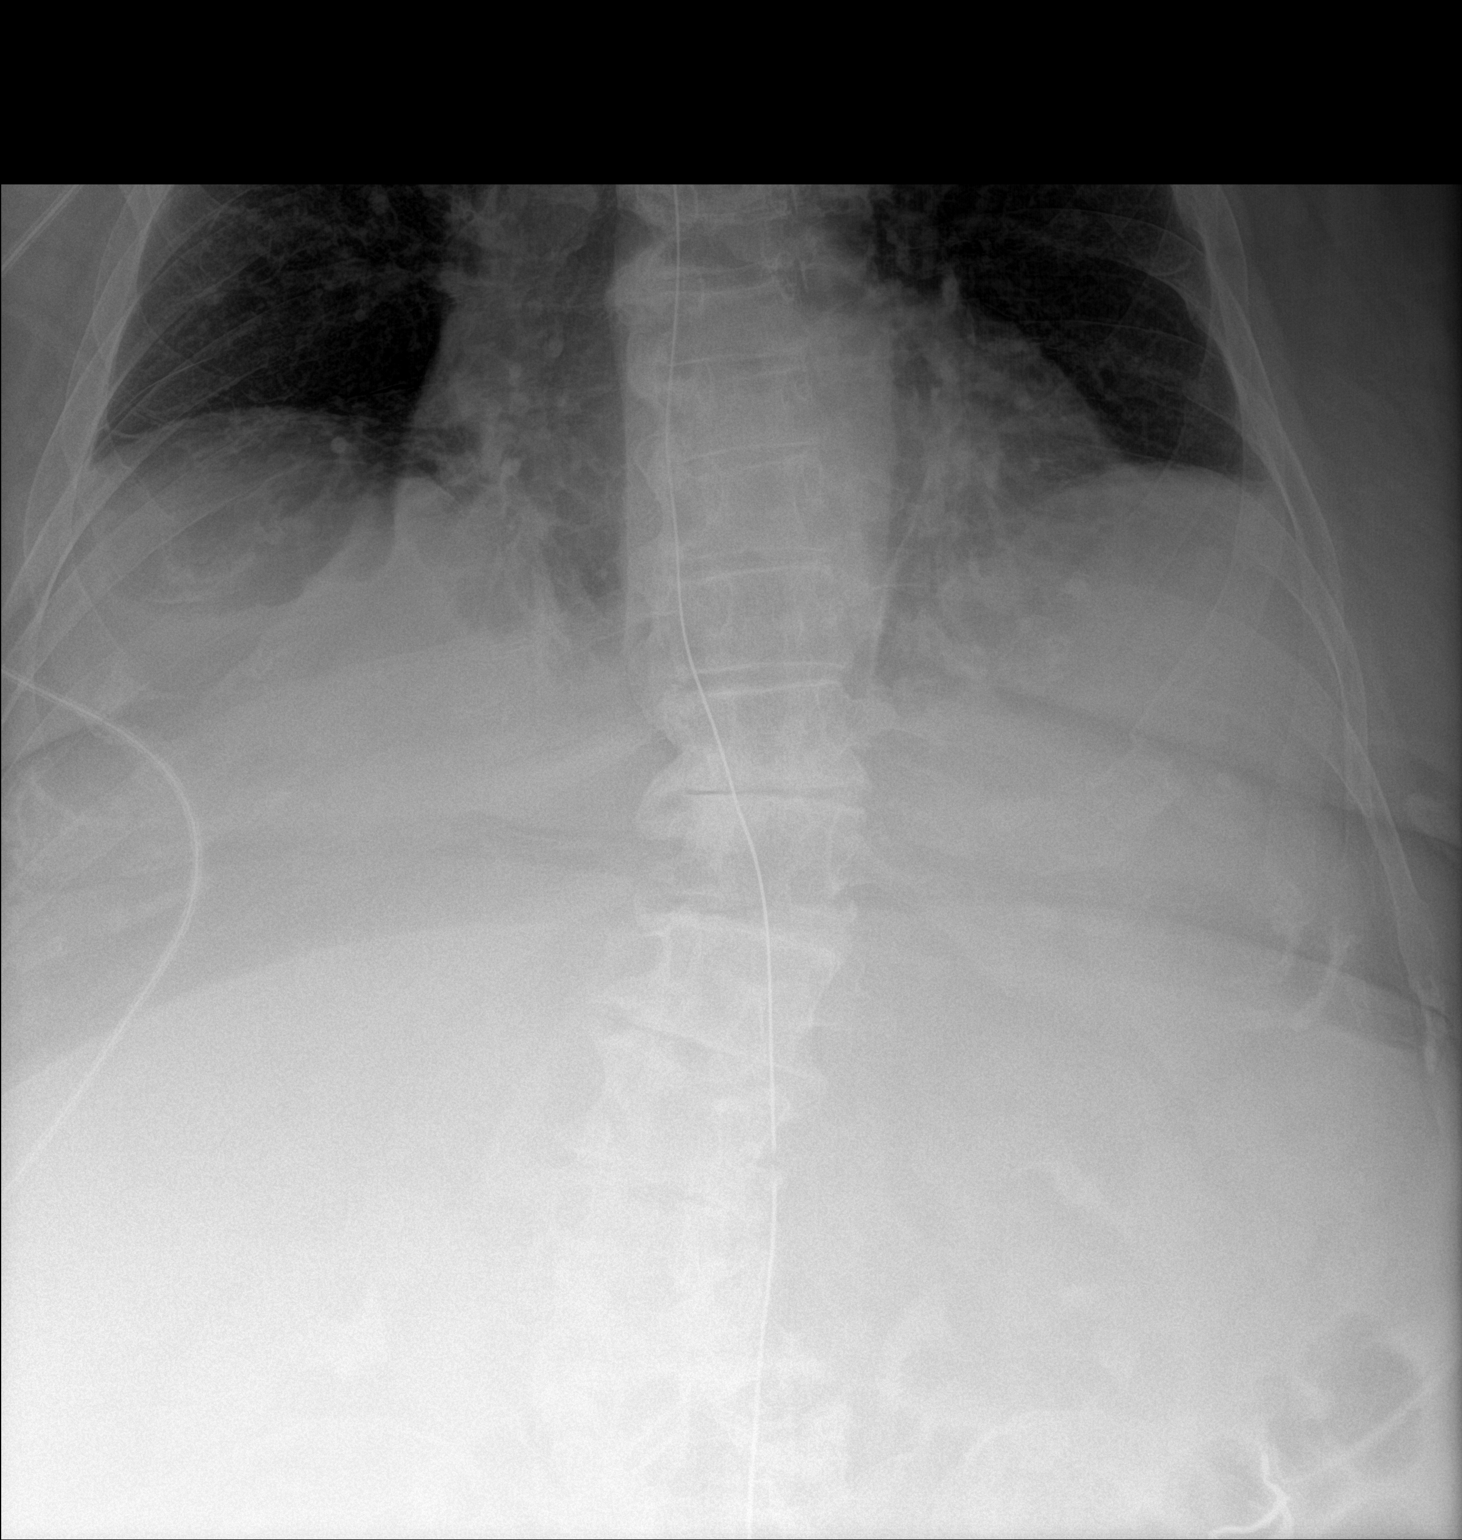

[abdomen kub (2 of 2)]
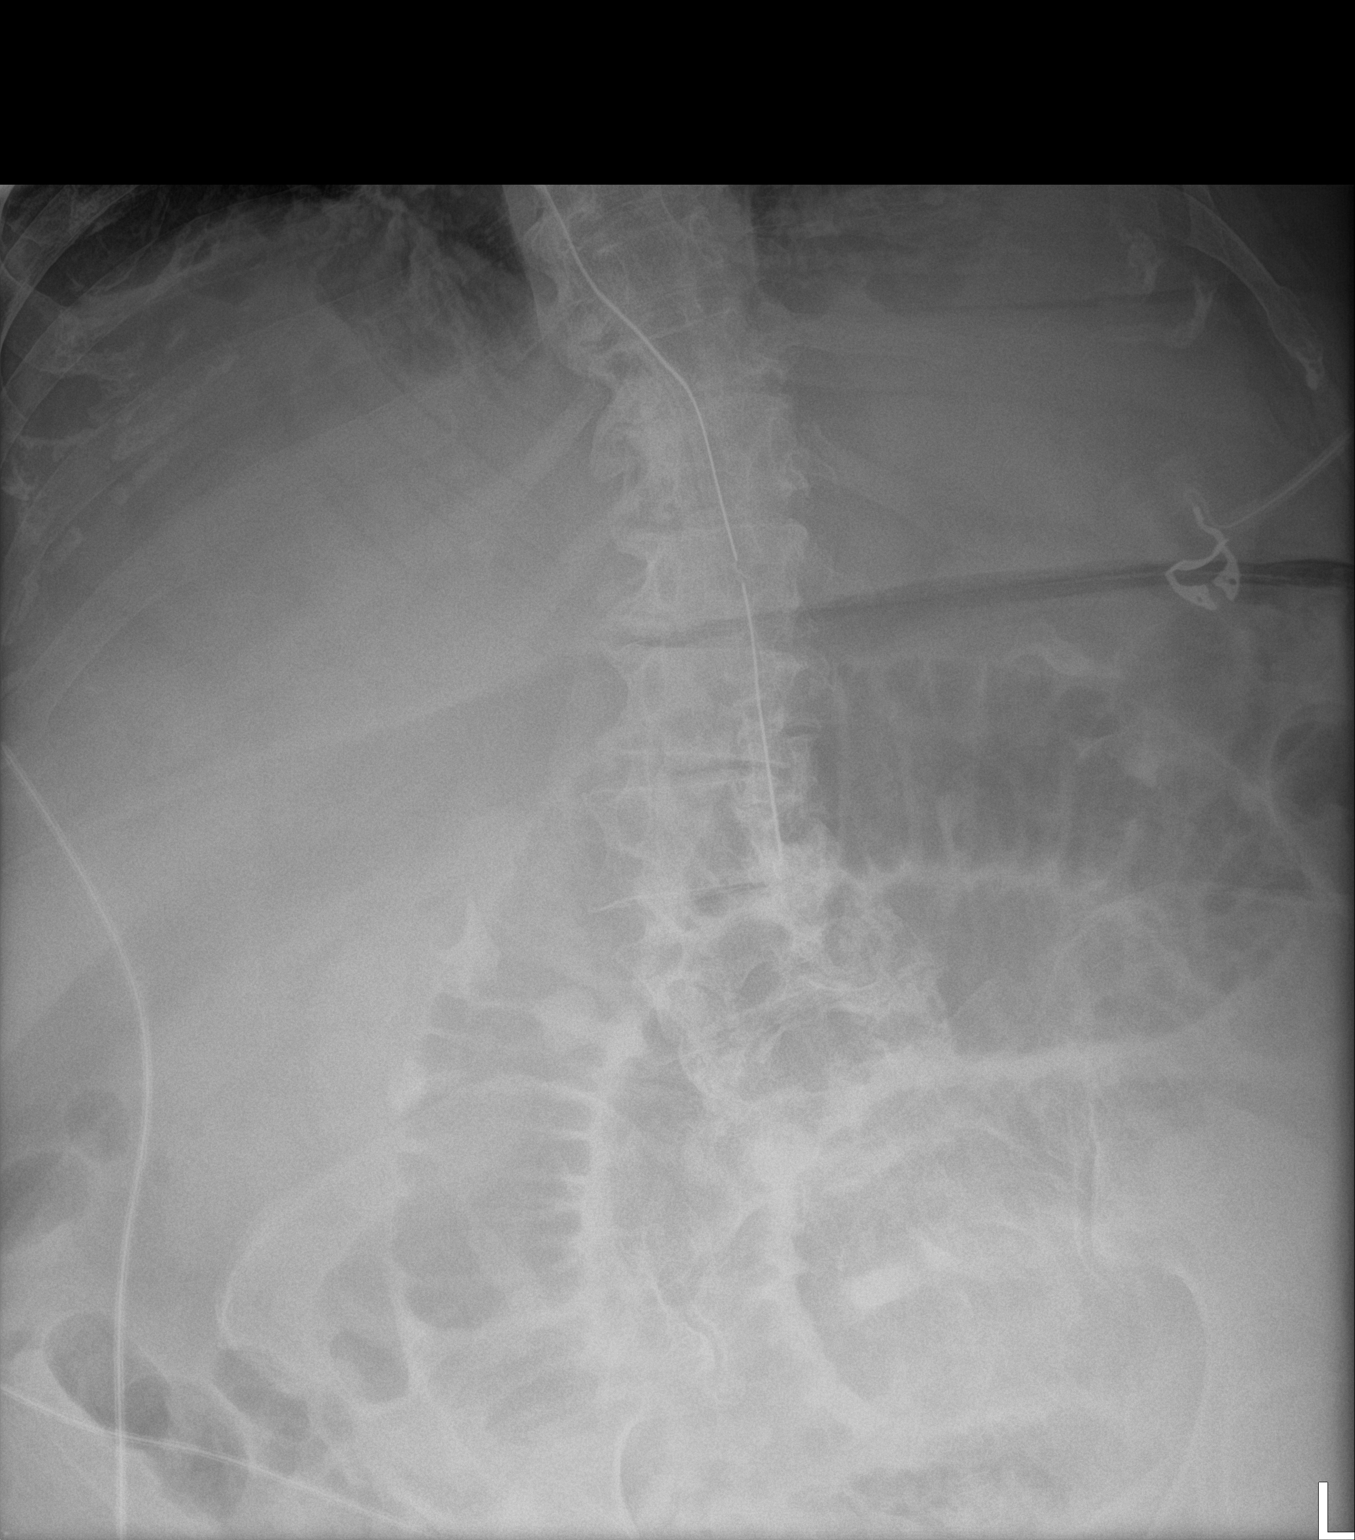

[2 of 2 positions shown; findings below may reference images not displayed]

FINDINGS: An enteric tube partially visualized extending down over the spine
with side-port over the upper lumbar spine and tip in the mid
abdomen at the level of the L3-L4 disc, likely in the distal
stomach. Dilated loops of small bowel noted in the mid abdomen
measuring up to 5.5 cm.
IMPRESSION: Enteric tube with tip likely in the distal stomach. There is
persistent dilatation of small-bowel loops.

## 2018-05-13 IMAGING — DX DG CHEST 1V PORT
1 series · 1 of 1 positions shown · non-contrast
Comparison: 06/02/2013 chest radiographs. Preoperative CT Abdomen
and Pelvis 10/15/2017

CLINICAL DATA: 71-year-old female postoperative day 1 from
incarcerated ventral abdominal hernia repair. Shortness of breath.

EXAM:
PORTABLE CHEST 1 VIEW

[chest]
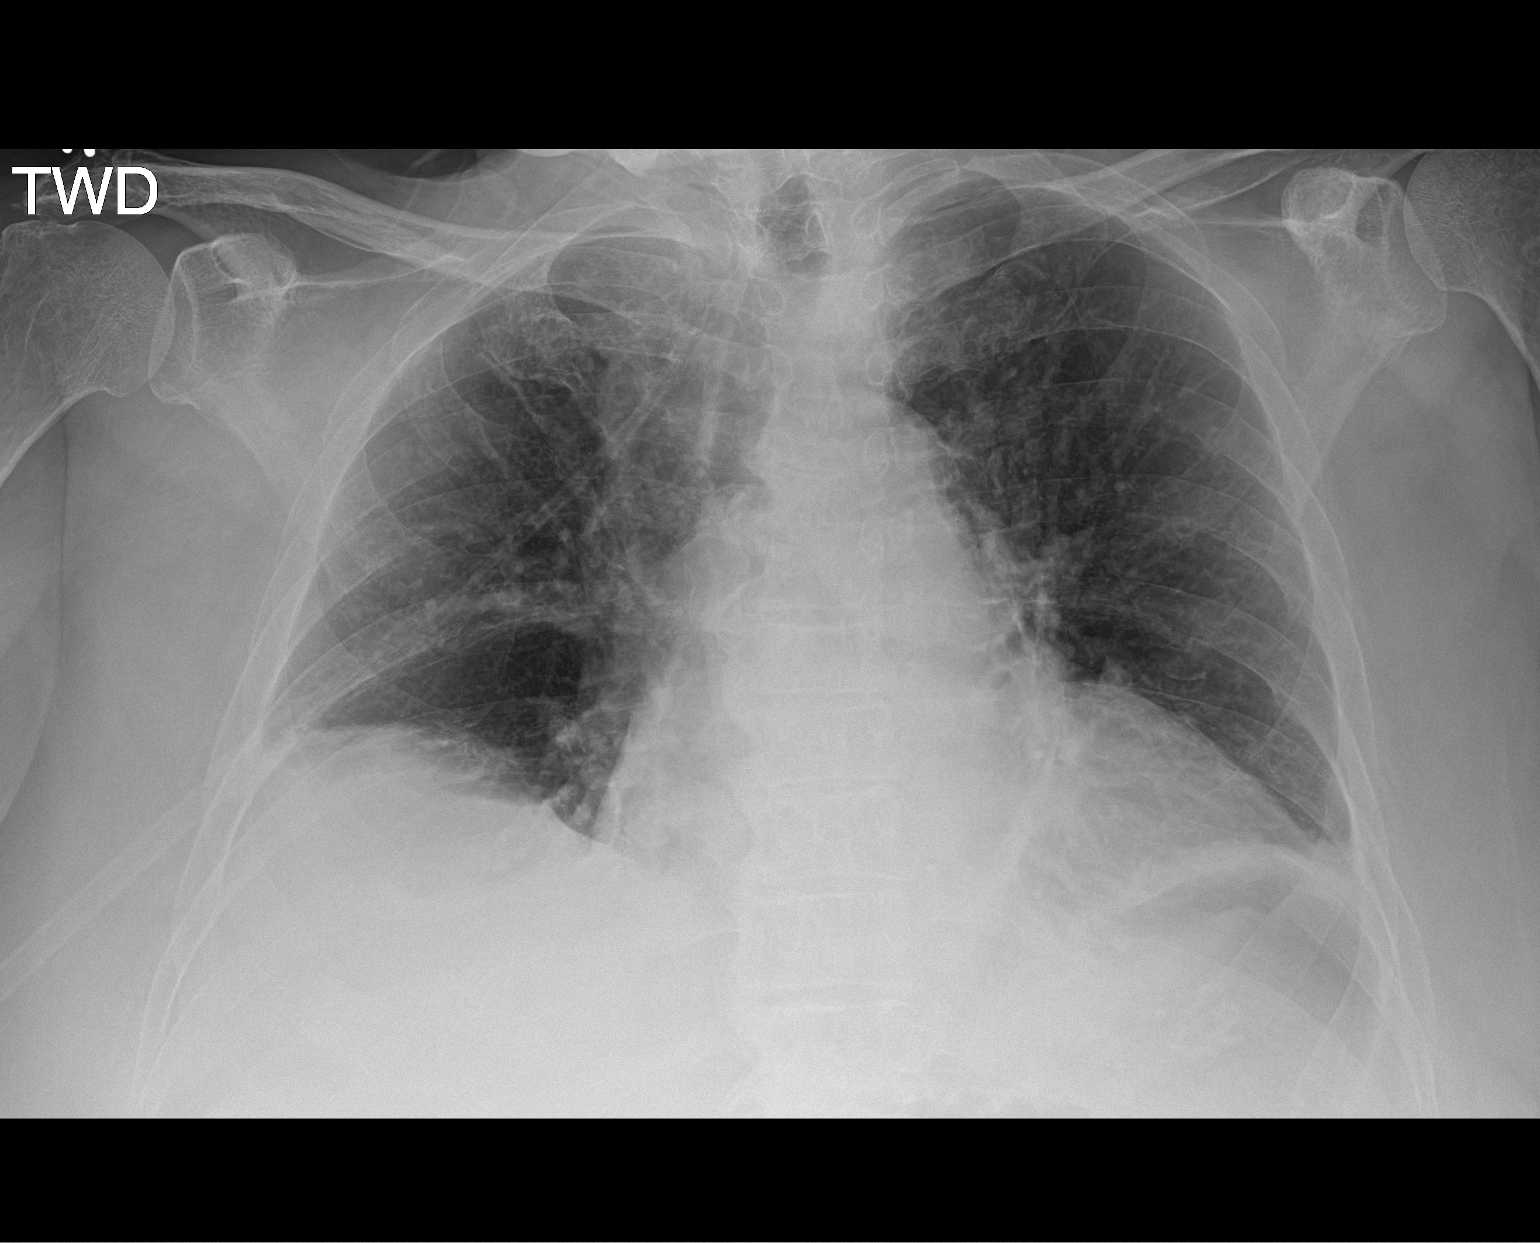

[1 of 1 positions shown; findings below may reference images not displayed]

FINDINGS: Seated upright AP portable view at 1919 hours. Persistent mild
patchy opacity at both lung bases which most resembles atelectasis
as on the recent CT. Stable mild cardiomegaly. Other mediastinal
contours are within normal limits. Stable tracheal air column since
7625. No pneumothorax or pulmonary edema. No definite pleural
effusion. No definite pneumoperitoneum and unremarkable visible
bowel gas in the left upper quadrant.
IMPRESSION: Low lung volumes with mild atelectasis. No other acute
cardiopulmonary abnormality identified.

## 2018-06-12 ENCOUNTER — Ambulatory Visit (AMBULATORY_SURGERY_CENTER): Payer: Self-pay

## 2018-06-12 VITALS — Ht 60.0 in | Wt 179.8 lb

## 2018-06-12 DIAGNOSIS — Z86010 Personal history of colonic polyps: Secondary | ICD-10-CM

## 2018-06-12 NOTE — Progress Notes (Signed)
Patient is on Home O2. Patient states she doesn't use it often. However it was ordered for home use. Lisa Hamilton is scheduled to see Pulmonology on 06/30/18. An office visit will be scheduled with Dr. Ardis Hughs or one of our PA's.

## 2018-06-16 HISTORY — PX: LITHOTRIPSY: SUR834

## 2018-06-23 ENCOUNTER — Other Ambulatory Visit: Payer: Self-pay

## 2018-06-26 ENCOUNTER — Encounter: Payer: Medicare Other | Admitting: Gastroenterology

## 2018-06-30 ENCOUNTER — Encounter: Payer: Self-pay | Admitting: Internal Medicine

## 2018-06-30 ENCOUNTER — Ambulatory Visit (INDEPENDENT_AMBULATORY_CARE_PROVIDER_SITE_OTHER): Payer: Medicare Other | Admitting: Internal Medicine

## 2018-06-30 VITALS — BP 116/70 | HR 73 | Ht 60.0 in | Wt 179.4 lb

## 2018-06-30 DIAGNOSIS — G2581 Restless legs syndrome: Secondary | ICD-10-CM | POA: Diagnosis not present

## 2018-06-30 DIAGNOSIS — J9611 Chronic respiratory failure with hypoxia: Secondary | ICD-10-CM

## 2018-06-30 MED ORDER — ALBUTEROL SULFATE (2.5 MG/3ML) 0.083% IN NEBU: INHALATION_SOLUTION | RESPIRATORY_TRACT | 3 refills | Status: DC

## 2018-06-30 MED ORDER — MONTELUKAST SODIUM 10 MG PO TABS: 10.0000 mg | ORAL_TABLET | Freq: Every evening | ORAL | 3 refills | Status: DC

## 2018-06-30 MED ORDER — CLONAZEPAM 0.5 MG PO TABS: ORAL_TABLET | ORAL | 1 refills | Status: DC

## 2018-06-30 MED ORDER — ROPINIROLE HCL 0.5 MG PO TABS: ORAL_TABLET | ORAL | 3 refills | Status: DC

## 2018-06-30 NOTE — Patient Instructions (Signed)
Order- [facility]- overnight oximetry    Dx chronic respiratory failure  Try tapering gabapentin by 1 capsule/ day, each week   Please call as needed

## 2018-06-30 NOTE — Progress Notes (Signed)
HPI female never smoker followed for Restless Legs, back pain that disturbs sleep, asthma, complicated by peripheral venous disease, hemochromatosis  ---------------------------------------------------------------------- 03/02/2018- 72 year old female never smoker followed for Restless Legs, back pain that disturbs sleep, asthma, complicated by peripheral venous disease, hemochromatosis  O2 2l Advanced sleep and prn  Using a self-fill system Theophylline, albuterol nebulizer, Singulair, Requip 0.5 mg (up to 5 tabs daily) -----She has recently been in the hospita( liver cyst-partial hepatectomy)l in May. She has had surgery 2 times and has been put on oxygen 2l at night and sometimes during the day. Had partial bowel obst, repair inguinal hernia, then hepatic cyst resection. Using O2 during the day if increased exertion or poor air quality.  Wakes some days with chest tightness, responding to her neb treatments. Restless leg symptoms have been worse, possibly related to anemia. She is appropriate for [facility] parking.  CXR 12/04/2017- IMPRESSION: Probable mild bibasilar subsegmental atelectasis.  06/30/2018- 72 year old female never smoker followed for Restless Legs, back pain that disturbs sleep, asthma, complicated by peripheral venous disease, hemochromatosis/ Fe def anemia, O2 2l Advanced sleep and prn  Using a self-fill system ----4 mo f/u for asthma O2 was started with hospitalization after abdominal surgeries for partial bowel obstruction, hernia repair and hepatic cyst resection May, 2019. Admits she has not used oxygen in a month and hopes she can be free of it at night-discussed. She wants to have colonoscopy done as an outpatient.  If she requires oxygen it would need to be done in hospital. Doing water aerobics 3 days/week. Requip 0.5 mg, Neurontin 300 mg, clonazepam 0.5 mg, neb albuterol, Singulair, She thinks her breathing is doing really well now.  Asks about weaning off of gabapentin  which was prescribed for cough. Using Requip 1 in the morning, 1 in the afternoon and 2 in the evening then 1 at bedtime, up to a total of 6/day.  Right now that seems to be enough.              ROS-see HPI + = positive Constitutional:   No-   weight loss, night sweats, fevers, chills, +fatigue, lassitude. HEENT:   No-  headaches, difficulty swallowing, tooth/dental problems, sore throat,       No-  sneezing, itching, ear ache, nasal congestion, post nasal drip,  CV:  No-   chest pain, orthopnea, PND, swelling in lower extremities, anasarca,                                                     dizziness, palpitations Resp: + shortness of breath with exertion or at rest.              No-   productive cough,  No non-productive cough,  No- coughing up of blood.              No-   change in color of mucus. + wheezing.   Skin: No-   rash or lesions. GI:  No-   heartburn, indigestion, abdominal pain, nausea, vomiting,  GU:     MS:  No-   joint pain or swelling.  No- decreased range of motion.  + back pain. Neuro-     + per HPI Psych:  No- change in mood or affect. No depression or anxiety.  No memory loss.  OBJ- Physical Exam General- Alert, Oriented,  Affect-appropriate, Distress- none acute, +obese Skin- rash-none, lesions- none, excoriation- none Lymphadenopathy- none Head- atraumatic            Eyes- Gross vision intact, PERRLA, conjunctivae and secretions clear            Ears- Hearing, canals-normal            Nose- Clear, no-Septal dev, mucus, polyps, erosion, perforation             Throat- Mallampati II-III , mucosa clear , drainage- none, tonsils- atrophic Neck- flexible , trachea midline, no stridor , thyroid nl, carotid no bruit Chest - symmetrical excursion , unlabored           Heart/CV- RRR , no murmur , no gallop  , no rub, nl s1 s2                           - JVD+2cm , edema +2-3, stasis changes- none,  superficial varices +           Lung- clear to P&A, wheeze- none, cough-  none , dullness-none, rub- none           Chest wall-  Abd-  Br/ Gen/ Rectal- Not done, not indicated Extrem- cyanosis- none, clubbing, none, atrophy- none, strength- nl, + walking boot left foot after surgery Neuro- grossly intact to observation, no unusual restlessness or movement, +cane

## 2018-07-01 IMAGING — DX DG CHEST 1V PORT
1 series · 1 of 1 positions shown · non-contrast
Comparison: Radiograph October 16, 2017.

CLINICAL DATA: Postoperative pain.

EXAM:
PORTABLE CHEST 1 VIEW

[chest ap]
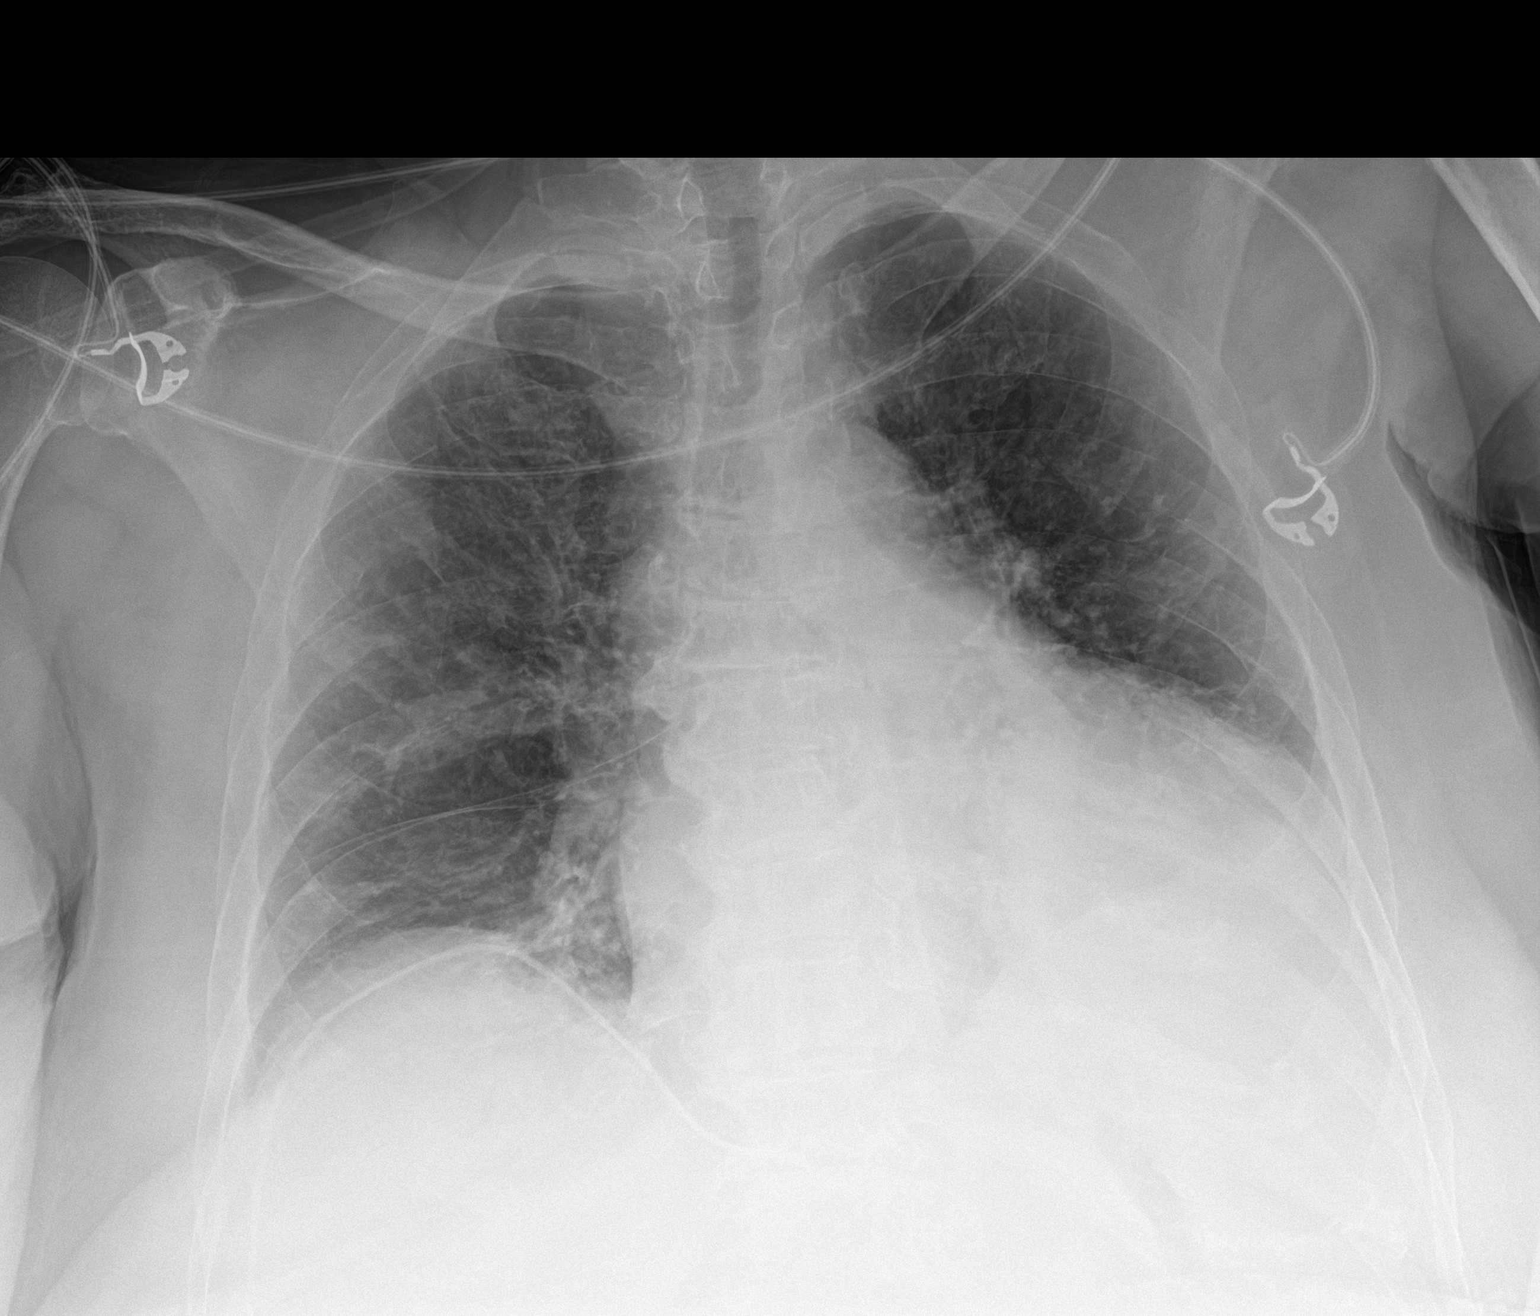

[1 of 1 positions shown; findings below may reference images not displayed]

FINDINGS: Stable cardiomegaly. No pneumothorax or significant pleural effusion
is noted. Bony thorax is unremarkable. Probable mild bibasilar
subsegmental atelectasis is noted.
IMPRESSION: Probable mild bibasilar subsegmental atelectasis.

## 2018-07-07 ENCOUNTER — Ambulatory Visit (INDEPENDENT_AMBULATORY_CARE_PROVIDER_SITE_OTHER): Payer: Medicare Other | Admitting: *Deleted

## 2018-07-07 VITALS — BP 124/76 | HR 61 | Resp 17 | Ht 60.0 in | Wt 177.0 lb

## 2018-07-07 DIAGNOSIS — Z0000 Encounter for general adult medical examination without abnormal findings: Secondary | ICD-10-CM

## 2018-07-07 NOTE — Patient Instructions (Addendum)
Continue doing brain stimulating activities (puzzles, reading, adult coloring books, staying active) to keep memory sharp.   Continue to eat heart healthy diet (full of fruits, vegetables, whole grains, lean protein, water--limit salt, fat, and sugar intake) and increase physical activity as tolerated.   Ms. Lisa Hamilton , Thank you for taking time to come for your Medicare Wellness Visit. I appreciate your ongoing commitment to your health goals. Please review the following plan we discussed and let me know if I can assist you in the future.   These are the goals we discussed: Goals      Patient Stated   . <enter goal here> (pt-stated)     Get my legs in shape to be able to move without limitation. Seeing vascular/vein specialist soon for evaluation.       Other   . Patient Stated     I want to continue to lose weight and stay on the next 56 days diet until I reach my weight goal, I will continue to do water aerobics.     . Patient Stated     I will continue to do massage therapy, do water aerobics, noom diet and make it part of my lifestyle. Love and visit my grandchildren and family.       This is a list of the screening recommended for you and due dates:  Health Maintenance  Topic Date Due  . Colon Cancer Screening  11/28/2017  . Mammogram  07/26/2019  . Tetanus Vaccine  06/07/2024  . Flu Shot  Completed  . DEXA scan (bone density measurement)  Completed  .  Hepatitis C: One time screening is recommended by Center for Disease Control  (CDC) for  adults born from [birth year] through 1964.   Completed  . Pneumonia vaccines  Completed   Health Maintenance, Female Adopting a healthy lifestyle and getting preventive care can go a long way to promote health and wellness. Talk with your health care provider about what schedule of regular examinations is right for you. This is a good chance for you to check in with your provider about disease prevention and staying healthy. In between checkups,  there are plenty of things you can do on your own. Experts have done a lot of research about which lifestyle changes and preventive measures are most likely to keep you healthy. Ask your health care provider for more information. Weight and diet Eat a healthy diet  Be sure to include plenty of vegetables, fruits, low-fat dairy products, and lean protein.  Do not eat a lot of foods high in solid fats, added sugars, or salt.  Get regular exercise. This is one of the most important things you can do for your health. ? Most adults should exercise for at least 150 minutes each week. The exercise should increase your heart rate and make you sweat (moderate-intensity exercise). ? Most adults should also do strengthening exercises at least twice a week. This is in addition to the moderate-intensity exercise.  Maintain a healthy weight  Body mass index (BMI) is a measurement that can be used to identify possible weight problems. It estimates body fat based on height and weight. Your health care provider can help determine your BMI and help you achieve or maintain a healthy weight.  For females 66 years of age and older: ? A BMI below 18.5 is considered underweight. ? A BMI of 18.5 to 24.9 is normal. ? A BMI of 25 to 29.9 is considered overweight. ? A BMI  of 30 and above is considered obese.  Watch levels of cholesterol and blood lipids  You should start having your blood tested for lipids and cholesterol at 71 years of age, then have this test every 5 years.  You may need to have your cholesterol levels checked more often if: ? Your lipid or cholesterol levels are high. ? You are older than 71 years of age. ? You are at high risk for heart disease.  Cancer screening Lung Cancer  Lung cancer screening is recommended for adults 16-76 years old who are at high risk for lung cancer because of a history of smoking.  A yearly low-dose CT scan of the lungs is recommended for people  who: ? Currently smoke. ? Have quit within the past 15 years. ? Have at least a 30-pack-year history of smoking. A pack year is smoking an average of one pack of cigarettes a day for 1 year.  Yearly screening should continue until it has been 15 years since you quit.  Yearly screening should stop if you develop a health problem that would prevent you from having lung cancer treatment.  Breast Cancer  Practice breast self-awareness. This means understanding how your breasts normally appear and feel.  It also means doing regular breast self-exams. Let your health care provider know about any changes, no matter how small.  If you are in your 20s or 30s, you should have a clinical breast exam (CBE) by a health care provider every 1-3 years as part of a regular health exam.  If you are 35 or older, have a CBE every year. Also consider having a breast X-ray (mammogram) every year.  If you have a family history of breast cancer, talk to your health care provider about genetic screening.  If you are at high risk for breast cancer, talk to your health care provider about having an MRI and a mammogram every year.  Breast cancer gene (BRCA) assessment is recommended for women who have family members with BRCA-related cancers. BRCA-related cancers include: ? Breast. ? Ovarian. ? Tubal. ? Peritoneal cancers.  Results of the assessment will determine the need for genetic counseling and BRCA1 and BRCA2 testing.  Cervical Cancer Your health care provider may recommend that you be screened regularly for cancer of the pelvic organs (ovaries, uterus, and vagina). This screening involves a pelvic examination, including checking for microscopic changes to the surface of your cervix (Pap test). You may be encouraged to have this screening done every 3 years, beginning at age 58.  For women ages 89-65, health care providers may recommend pelvic exams and Pap testing every 3 years, or they may recommend  the Pap and pelvic exam, combined with testing for human papilloma virus (HPV), every 5 years. Some types of HPV increase your risk of cervical cancer. Testing for HPV may also be done on women of any age with unclear Pap test results.  Other health care providers may not recommend any screening for nonpregnant women who are considered low risk for pelvic cancer and who do not have symptoms. Ask your health care provider if a screening pelvic exam is right for you.  If you have had past treatment for cervical cancer or a condition that could lead to cancer, you need Pap tests and screening for cancer for at least 20 years after your treatment. If Pap tests have been discontinued, your risk factors (such as having a new sexual partner) need to be reassessed to determine if screening should  resume. Some women have medical problems that increase the chance of getting cervical cancer. In these cases, your health care provider may recommend more frequent screening and Pap tests.  Colorectal Cancer  This type of cancer can be detected and often prevented.  Routine colorectal cancer screening usually begins at 71 years of age and continues through 71 years of age.  Your health care provider may recommend screening at an earlier age if you have risk factors for colon cancer.  Your health care provider may also recommend using home test kits to check for hidden blood in the stool.  A small camera at the end of a tube can be used to examine your colon directly (sigmoidoscopy or colonoscopy). This is done to check for the earliest forms of colorectal cancer.  Routine screening usually begins at age 23.  Direct examination of the colon should be repeated every 5-10 years through 71 years of age. However, you may need to be screened more often if early forms of precancerous polyps or small growths are found.  Skin Cancer  Check your skin from head to toe regularly.  Tell your health care provider about  any new moles or changes in moles, especially if there is a change in a mole's shape or color.  Also tell your health care provider if you have a mole that is larger than the size of a pencil eraser.  Always use sunscreen. Apply sunscreen liberally and repeatedly throughout the day.  Protect yourself by wearing long sleeves, pants, a wide-brimmed hat, and sunglasses whenever you are outside.  Heart disease, diabetes, and high blood pressure  High blood pressure causes heart disease and increases the risk of stroke. High blood pressure is more likely to develop in: ? People who have blood pressure in the high end of the normal range (130-139/85-89 mm Hg). ? People who are overweight or obese. ? People who are African American.  If you are 48-70 years of age, have your blood pressure checked every 3-5 years. If you are 77 years of age or older, have your blood pressure checked every year. You should have your blood pressure measured twice-once when you are at a hospital or clinic, and once when you are not at a hospital or clinic. Record the average of the two measurements. To check your blood pressure when you are not at a hospital or clinic, you can use: ? An automated blood pressure machine at a pharmacy. ? A home blood pressure monitor.  If you are between 87 years and 23 years old, ask your health care provider if you should take aspirin to prevent strokes.  Have regular diabetes screenings. This involves taking a blood sample to check your fasting blood sugar level. ? If you are at a normal weight and have a low risk for diabetes, have this test once every three years after 71 years of age. ? If you are overweight and have a high risk for diabetes, consider being tested at a younger age or more often. Preventing infection Hepatitis B  If you have a higher risk for hepatitis B, you should be screened for this virus. You are considered at high risk for hepatitis B if: ? You were born in  a country where hepatitis B is common. Ask your health care provider which countries are considered high risk. ? Your parents were born in a high-risk country, and you have not been immunized against hepatitis B (hepatitis B vaccine). ? You have HIV or  AIDS. ? You use needles to inject street drugs. ? You live with someone who has hepatitis B. ? You have had sex with someone who has hepatitis B. ? You get hemodialysis treatment. ? You take certain medicines for conditions, including cancer, organ transplantation, and autoimmune conditions.  Hepatitis C  Blood testing is recommended for: ? Everyone born from [birth year] through 1964. ? Anyone with known risk factors for hepatitis C.  Sexually transmitted infections (STIs)  You should be screened for sexually transmitted infections (STIs) including gonorrhea and chlamydia if: ? You are sexually active and are younger than 71 years of age. ? You are older than 71 years of age and your health care provider tells you that you are at risk for this type of infection. ? Your sexual activity has changed since you were last screened and you are at an increased risk for chlamydia or gonorrhea. Ask your health care provider if you are at risk.  If you do not have HIV, but are at risk, it may be recommended that you take a prescription medicine daily to prevent HIV infection. This is called pre-exposure prophylaxis (PrEP). You are considered at risk if: ? You are sexually active and do not regularly use condoms or know the HIV status of your partner(s). ? You take drugs by injection. ? You are sexually active with a partner who has HIV.  Talk with your health care provider about whether you are at high risk of being infected with HIV. If you choose to begin PrEP, you should first be tested for HIV. You should then be tested every 3 months for as long as you are taking PrEP. Pregnancy  If you are premenopausal and you may become pregnant, ask your health  care provider about preconception counseling.  If you may become pregnant, take 400 to 800 micrograms (mcg) of folic acid every day.  If you want to prevent pregnancy, talk to your health care provider about birth control (contraception). Osteoporosis and menopause  Osteoporosis is a disease in which the bones lose minerals and strength with aging. This can result in serious bone fractures. Your risk for osteoporosis can be identified using a bone density scan.  If you are 49 years of age or older, or if you are at risk for osteoporosis and fractures, ask your health care provider if you should be screened.  Ask your health care provider whether you should take a calcium or vitamin D supplement to lower your risk for osteoporosis.  Menopause may have certain physical symptoms and risks.  Hormone replacement therapy may reduce some of these symptoms and risks. Talk to your health care provider about whether hormone replacement therapy is right for you. Follow these instructions at home:  Schedule regular health, dental, and eye exams.  Stay current with your immunizations.  Do not use any tobacco products including cigarettes, chewing tobacco, or electronic cigarettes.  If you are pregnant, do not drink alcohol.  If you are breastfeeding, limit how much and how often you drink alcohol.  Limit alcohol intake to no more than 1 drink per day for nonpregnant women. One drink equals 12 ounces of beer, 5 ounces of Tonnie Friedel, or 1 ounces of hard liquor.  Do not use street drugs.  Do not share needles.  Ask your health care provider for help if you need support or information about quitting drugs.  Tell your health care provider if you often feel depressed.  Tell your health care provider if you  have ever been abused or do not feel safe at home. This information is not intended to replace advice given to you by your health care provider. Make sure you discuss any questions you have with  your health care provider. Document Released: 02/04/2011 Document Revised: 12/28/2015 Document Reviewed: 04/25/2015 [employer]vier Interactive Patient Education  [employer].

## 2018-07-07 NOTE — Progress Notes (Signed)
Subjective:   Lisa Hamilton is a 72 y.o. female who presents for Medicare Annual (Subsequent) preventive examination.  Review of Systems:  No ROS.  Medicare Wellness Visit. Additional risk factors are reflected in the social history.  Cardiac Risk Factors include: advanced age (>81men, >62 women);dyslipidemia;obesity (BMI >30kg/m2) Sleep patterns: has interrupted sleep, feels rested on waking, gets up 1 times nightly to void and sleeps 5-6 hours nightly. Patient reports insomnia issues, discussed recommended sleep tips and stress reduction tips.   Home Safety/Smoke Alarms: Feels safe in home. Smoke alarms in place.  Living environment; residence and Firearm Safety: 1-story house/ trailer, equipment: Radio producer, Type: [place] [employer], [place], Type: Conservation officer, nature and [employer], Type: Tub Surveyor, quantity, no firearms. Lives with husband, no needs for DME, good support system. Seat Belt Safety/Bike Helmet: Wears seat belt.      Objective:     Vitals: BP 124/76   Pulse 61   Resp 17   Ht 5' (1.524 m)   Wt 177 lb (80.3 kg)   SpO2 97%   BMI 34.57 kg/m   Body mass index is 34.57 kg/m.  Advanced Directives 07/07/2018 12/05/2017 11/28/2017 10/15/2017 10/14/2017 07/01/2017 08/07/2016  Does Patient Have a Medical Advance Directive? Yes Yes Yes No No Yes Yes  Type of Paramedic of [place];Living will ;Living will [place];Living will - - [place];Living will [place];Living will  Does patient want to make changes to medical advance directive? - No - Patient declined - - - - -  Copy of [place] in Chart? No - copy requested - No - copy requested - - No - copy requested No - copy requested  Would patient like information on creating a medical advance directive? - - - No - Patient declined - - -  Pre-existing out of facility DNR order (yellow form or pink MOST form) - - -  - - - -    Tobacco Social History   Tobacco Use  Smoking Status Never Smoker  Smokeless Tobacco Never Used     Counseling given: Not Answered     Past Medical History:  Diagnosis Date  . Allergy   . Anemia   . Asthmatic bronchitis   . Blood transfusion without reported diagnosis   . Cancer (HCC)    skin cancer- squamous cell- scalp, right wrist  . Cataract   . Constipation    chronic  . COPD (chronic obstructive pulmonary disease) ([place])   . Diverticulosis of colon   . DJD (degenerative joint disease)   . GERD (gastroesophageal reflux disease)   . Glaucoma 1977   EYE SURGERY  FOR TX OF GLAUCOMA--NO LONGER HAS GLAUCOMA  . Hepatic cyst    s/p surgical intervention 05/2011 -[place]  . Hiatal hernia   . History of kidney stones   . Hx of colonic polyps   . Hyperlipidemia   . Hypertension   . Kidney stone   . Lipoma   . Medullary sponge kidney   . Nontoxic multinodular goiter    pt unsure of this  . Obesity   . Osteoporosis   . Oxygen deficiency   . Partial small bowel obstruction ([place]) 10/15/2017  . Renal calculus    s/p lithotripsy 06/2011  . Restless leg syndrome   . Shingles 2009  . Venous insufficiency   . Vitamin D deficiency    Past Surgical History:  Procedure Laterality Date  .  bilateral carpel tunnel repairs     Dr. Shellia Carwin  left--1999   right--2008  . bilateral wrist fracture, right  1996   left 2001, Dr. Daylene Katayama  . CATARACT EXTRACTION  04/07/13 R, 05/18/13 L   [facility]  . COLONOSCOPY    . eye surgery Left 11/22/2014   "MD pucker" repair  . [place]  . [place]  . HERNIA REPAIR  2012   umb hernia  . INSERTION OF MESH N/A 10/15/2017   Procedure: INSERTION OF MESH;  Surgeon: Donnie Mesa, MD;  Location: [facility];  Service: General;  Laterality: N/A;  . LAPAROSCOPIC LIVER CYST FENESTRATION  05/2011  . LAPAROSCOPIC PARTIAL HEPATECTOMY N/A 12/04/2017   Procedure: LAPAROSCOPIC PARTIAL HEPATECTOMY;  Surgeon: [name], MD;   Location: [facility];  Service: General;  Laterality: N/A;  . LIPOMA EXCISION  08/13/2012   Procedure: EXCISION LIPOMA;  Surgeon: Magnus Sinning, MD;  Location: [facility];  Service: Orthopedics;  Laterality: Left;  . SHOULDER OPEN ROTATOR CUFF REPAIR  08/13/2012   Procedure: ROTATOR CUFF REPAIR SHOULDER OPEN;  Surgeon: Magnus Sinning, MD;  Location: [facility];  Service: Orthopedics;  Laterality: Left;  LEFT SHOULDER ANTERIOR ACROMINECTOMY AND ROTATOR CUFF REPAIR AND EXCISION LEFT SHOULDER LIPOMA  . SQUAMOUS CELL CARCINOMA EXCISION  2010   L [place]  . TONSILLECTOMY  1967  . VENTRAL HERNIA REPAIR N/A 10/15/2017   Procedure: HERNIA REPAIR VENTRAL ADULT;  Surgeon: Donnie Mesa, MD;  Location[facility];  Service: General;  Laterality: N/A;   Family History  Problem Relation Age of Onset  . Lung cancer Father   . Colon cancer Father        possible colon cancer, but unsure  . Lung cancer Sister   . Irritable bowel syndrome Sister   . Obstructive Sleep Apnea Sister   . Osteoarthritis Sister   . Esophageal cancer Neg Hx   . Rectal cancer Neg Hx   . Stomach cancer Neg Hx    Social History   Socioeconomic History  . Marital status: Married    Spouse name: Fritz Pickerel  . Number of children: 2  . Years of education: Not on file  . Highest education level: Not on file  Occupational History  . Occupation: Dance movement psychotherapist for [employer]  . Financial resource strain: Not hard at all  . Food insecurity:    Worry: Never true    Inability: Never true  . Transportation needs:    Medical: No    Non-medical: No  Tobacco Use  . Smoking status: Never Smoker  . Smokeless tobacco: Never Used  Substance and Sexual Activity  . Alcohol use: Yes    Alcohol/week: 0.0 standard drinks    Comment: social use  . Drug use: No  . Sexual activity: Never  Lifestyle  . Physical activity:    Days per week: 3 days    Minutes per session: 60 min  . Stress: Not at all  Relationships  . Social connections:      Talks on phone: More than three times a week    Gets together: More than three times a week    Attends religious service: More than 4 times per year    Active member of club or organization: Yes    Attends meetings of clubs or organizations: More than 4 times per year    Relationship status: Married  Other Topics Concern  . Not on file  Social History Narrative  .  Not on file    Outpatient Encounter Medications as of 07/07/2018  Medication Sig  . acetaminophen (TYLENOL) 500 MG tablet Take 1,000 mg by mouth 3 (three) times daily as needed for moderate pain or headache.  . albuterol (PROVENTIL) (2.5 MG/3ML) 0.083% nebulizer solution USE 1 VIAL IN NEBULIZER 4 TIMES DAILY. Generic: VENTOLIN  . aspirin EC 81 MG tablet Take 162 mg by mouth at bedtime.  . betamethasone dipropionate (DIPROLENE) 0.05 % cream Apply 1 application topically See admin instructions. Apply to legs twice daily, alternate every 2 weeks with triamcinolone  . Calcium Carb-Cholecalciferol (CALCIUM 600+D) 600-800 MG-UNIT TABS Take 1 tablet by mouth 2 (two) times daily.  . Cholecalciferol (VITAMIN D) 2000 units tablet Take 2,000 Units by mouth daily.  . clonazePAM (KLONOPIN) 0.5 MG tablet TAKE 1 TO 3 TABLETS BY  MOUTH 30 MINUTES BEFORE BED  . Co-Enzyme Q-10 100 MG CAPS Take 100 mg by mouth daily.   . furosemide (LASIX) 40 MG tablet Take 1 tablet (40 mg total) by mouth 3 (three) times daily. (Patient taking differently: Take 40-80 mg by mouth See admin instructions. Take 80 mg in the morning and 40 mg in the afternoon)  . gabapentin (NEURONTIN) 300 MG capsule TAKE 1 CAPSULE BY MOUTH 3  TIMES DAILY (Patient taking differently: 2 (two) times daily. )  . guaiFENesin (MUCINEX) 600 MG 12 hr tablet Take 600 mg by mouth 2 (two) times daily as needed (congestion).   Marland Kitchen ibuprofen (ADVIL,MOTRIN) 200 MG tablet Take 400 mg by mouth 3 (three) times daily as needed for headache or moderate pain.  Marland Kitchen losartan (COZAAR) 100 MG tablet Take 1  tablet (100 mg total) by mouth daily.  . Magnesium 400 MG CAPS Take 400 mg by mouth 2 (two) times daily.   . meloxicam (MOBIC) 15 MG tablet Take 15 mg by mouth daily.  . montelukast (SINGULAIR) 10 MG tablet Take 1 tablet (10 mg total) by mouth every evening.  . Multiple Vitamin (MULTIVITAMIN) capsule Take 1 capsule by mouth daily. Iron free  . Multiple Vitamins-Minerals (PRESERVISION AREDS 2) CAPS Take 1 capsule by mouth 2 (two) times daily.  . Omega-3 Fatty Acids (FISH OIL) 1200 MG CAPS Take 1,200 mg by mouth daily.  . polyethylene glycol (MIRALAX / GLYCOLAX) packet Take 17 g by mouth 3 (three) times daily as needed for moderate constipation.  . potassium chloride SA (K-DUR,KLOR-CON) 20 MEQ tablet Take 2 tablets (40 mEq total) by mouth 3 (three) times daily.  . pravastatin (PRAVACHOL) 40 MG tablet Take 1 tablet (40 mg total) by mouth daily.  Marland Kitchen Propylene Glycol (SYSTANE BALANCE OP) Place 1 drop into both eyes daily as needed (dry eyes).  Marland Kitchen rOPINIRole (REQUIP) 0.5 MG tablet Up to 6 daily as needed  . traMADol (ULTRAM) 50 MG tablet Take 1 tablet (50 mg total) by mouth daily as needed. (Patient taking differently: Take 50 mg by mouth every 6 (six) hours as needed for moderate pain. )  . triamcinolone cream (KENALOG) 0.1 % Apply 1 application topically 2 (two) times daily. (Patient taking differently: Apply 1 application topically See admin instructions. Apply to legs twice daily, alternate every 2 weeks with betamethasone)  . Krill Oil 500 MG CAPS Take 500 mg by mouth daily.   No facility-administered encounter medications on file as of 07/07/2018.     Activities of Daily Living In your present state of health, do you have any difficulty performing the following activities: 07/07/2018 12/05/2017  Hearing? N N  Vision?  N N  Difficulty concentrating or making decisions? N N  Walking or climbing stairs? N Y  Dressing or bathing? N N  Doing errands, shopping? N N  Preparing Food and eating ? N -    Using the Toilet? N -  In the past six months, have you accidently leaked urine? N -  Do you have problems with loss of bowel control? N -  Managing your Medications? N -  Managing your Finances? N -  Housekeeping or managing your Housekeeping? N -  Some recent data might be hidden    Patient Care Team: Hoyt Koch, MD as PCP - General (Internal Medicine) Irine Seal, MD (Urology) Rolm Bookbinder, MD (Dermatology) Rutherford Guys, MD (Ophthalmology) Tabor, Romilda Garret, [place] (Podiatry) Deneise Lever, MD (Pulmonary Disease) Milus Banister, MD (Gastroenterology) Jerelyn Charles, MD as Consulting Physician (Obstetrics) Noralee Space, MD as Consulting Physician (Pulmonary Disease) [place], Laurice Record, MD (Inactive) as Consulting Physician (Orthopedic Surgery) Edrick Kins, DPM as Consulting Physician (Podiatry) Jerelyn Charles, MD as Consulting Physician (Obstetrics)    Assessment:   This is a routine wellness examination for [place].Physical assessment deferred to PCP.   Exercise Activities and Dietary recommendations Current Exercise Habits: Structured exercise class;Home exercise routine, Type of exercise: walking(water aerobics), Time (Minutes): 60, Frequency (Times/Week): 3, Weekly Exercise (Minutes/Week): 180, Intensity: Mild, Exercise limited by: orthopedic condition(s)  Diet (meal preparation, eat out, water intake, caffeinated beverages, dairy products, fruits and vegetables): in general, a "healthy" diet  , well balanced eats a variety of fruits and vegetables daily, limits salt, fat/cholesterol, sugar,carbohydrates,caffeine, drinks 6-8 glasses of water daily.  Participating with the [place] diet, has lost 38 pounds.   Goals      Patient Stated   . <enter goal here> (pt-stated)     Get my legs in shape to be able to move without limitation. Seeing vascular/vein specialist soon for evaluation.       Other   . Patient Stated     I want to continue to lose weight and stay  on the next 56 days diet until I reach my weight goal, I will continue to do water aerobics.     . Patient Stated     I will continue to do massage therapy, do water aerobics, noom diet and make it part of my lifestyle. Love and visit my grandchildren and family.       Fall Risk Fall Risk  07/07/2018 07/01/2017 06/25/2016 08/30/2015 06/07/2014  Falls in the past year? 0 Yes No Yes No  Number falls in past yr: - 1 - 1 -  Injury with Fall? - Yes - No -  Risk for fall due to : Impaired balance/gait;Impaired mobility Impaired balance/gait;Impaired mobility - - -  Follow up - Falls prevention discussed - - -    Depression Screen PHQ 2/9 Scores 07/07/2018 07/01/2017 06/25/2016 08/30/2015  PHQ - 2 Score 0 0 0 0  PHQ- 9 Score 3 3 - -     Cognitive Function MMSE - Mini Mental State Exam 07/01/2017 06/25/2016  Orientation to time 5 5  Orientation to Place 5 5  Registration 3 3  Attention/ Calculation 5 5  Recall 2 3  Language- name 2 objects 2 2  Language- repeat 1 1  Language- follow 3 step command 3 3  Language- read & follow direction 1 1  Write a sentence 1 1  Copy design 1 1  Total score 29 30  Ad8 score reviewed for issues:  Issues making decisions: no  Less interest in hobbies / activities: no  Repeats questions, stories (family complaining): no  Trouble using ordinary gadgets (microwave, computer, phone):no  Forgets the month or year: no  Mismanaging finances: no  Remembering appts: no  Daily problems with thinking and/or memory: no Ad8 score is= 0  Immunization History  Administered Date(s) Administered  . H1N1 07/27/2008  . Influenza Split 05/16/2011  . Influenza, High Dose Seasonal PF 06/13/2015, 05/11/2016  . Influenza,inj,Quad PF,6+ Mos 06/02/2013, 06/07/2014  . Influenza-Unspecified 05/05/2017, 05/06/2018  . Pneumococcal Conjugate-13 01/04/2014  . Pneumococcal Polysaccharide-23 05/15/2012, 07/01/2017  . Td 09/26/2009  . Tdap 06/07/2014  .  Zoster 01/09/2013   Screening Tests Health Maintenance  Topic Date Due  . COLONOSCOPY  11/28/2017  . MAMMOGRAM  07/26/2019  . TETANUS/TDAP  06/07/2024  . INFLUENZA VACCINE  Completed  . DEXA SCAN  Completed  . Hepatitis C Screening  Completed  . PNA vac Low Risk Adult  Completed      Plan:   Has an upcoming appointment with GI 07/23/18 to discuss colonoscopy screening.  Continue doing brain stimulating activities (puzzles, reading, adult coloring books, staying active) to keep memory sharp.   Continue to eat heart healthy diet (full of fruits, vegetables, whole grains, lean protein, water--limit salt, fat, and sugar intake) and increase physical activity as tolerated.  I have personally reviewed and noted the following in the patient's chart:   . Medical and social history . Use of alcohol, tobacco or illicit drugs  . Current medications and supplements . Functional ability and status . Nutritional status . Physical activity . Advanced directives . List of other physicians . Vitals . Screenings to include cognitive, depression, and falls . Referrals and appointments  In addition, I have reviewed and discussed with patient certain preventive protocols, quality metrics, and best practice recommendations. A written personalized care plan for preventive services as well as general preventive health recommendations were provided to patient.     Michiel Cowboy, RN  07/07/2018

## 2018-07-07 NOTE — Progress Notes (Signed)
Medical screening examination/treatment/procedure(s) were performed by non-physician practitioner and as supervising physician I was immediately available for consultation/collaboration. I agree with above. Geraldean Walen A Pacey Altizer, MD 

## 2018-07-08 ENCOUNTER — Other Ambulatory Visit: Payer: Self-pay | Admitting: Internal Medicine

## 2018-07-08 ENCOUNTER — Ambulatory Visit: Payer: Medicare Other | Admitting: Nurse Practitioner

## 2018-07-08 DIAGNOSIS — J449 Chronic obstructive pulmonary disease, unspecified: Secondary | ICD-10-CM | POA: Diagnosis not present

## 2018-07-08 DIAGNOSIS — R0902 Hypoxemia: Secondary | ICD-10-CM | POA: Diagnosis not present

## 2018-07-09 DIAGNOSIS — L570 Actinic keratosis: Secondary | ICD-10-CM | POA: Diagnosis not present

## 2018-07-09 DIAGNOSIS — Z85828 Personal history of other malignant neoplasm of skin: Secondary | ICD-10-CM | POA: Diagnosis not present

## 2018-07-09 DIAGNOSIS — L821 Other seborrheic keratosis: Secondary | ICD-10-CM | POA: Diagnosis not present

## 2018-07-09 DIAGNOSIS — I872 Venous insufficiency (chronic) (peripheral): Secondary | ICD-10-CM | POA: Diagnosis not present

## 2018-07-09 DIAGNOSIS — L814 Other melanin hyperpigmentation: Secondary | ICD-10-CM | POA: Diagnosis not present

## 2018-07-09 DIAGNOSIS — I8311 Varicose veins of right lower extremity with inflammation: Secondary | ICD-10-CM | POA: Diagnosis not present

## 2018-07-09 DIAGNOSIS — D2362 Other benign neoplasm of skin of left upper limb, including shoulder: Secondary | ICD-10-CM | POA: Diagnosis not present

## 2018-07-09 DIAGNOSIS — I8312 Varicose veins of left lower extremity with inflammation: Secondary | ICD-10-CM | POA: Diagnosis not present

## 2018-07-09 DIAGNOSIS — D2239 Melanocytic nevi of other parts of face: Secondary | ICD-10-CM | POA: Diagnosis not present

## 2018-07-09 DIAGNOSIS — D225 Melanocytic nevi of trunk: Secondary | ICD-10-CM | POA: Diagnosis not present

## 2018-07-09 DIAGNOSIS — L918 Other hypertrophic disorders of the skin: Secondary | ICD-10-CM | POA: Diagnosis not present

## 2018-07-09 DIAGNOSIS — D1801 Hemangioma of skin and subcutaneous tissue: Secondary | ICD-10-CM | POA: Diagnosis not present

## 2018-07-14 ENCOUNTER — Ambulatory Visit (INDEPENDENT_AMBULATORY_CARE_PROVIDER_SITE_OTHER): Payer: Medicare Other | Admitting: Internal Medicine

## 2018-07-14 ENCOUNTER — Encounter: Payer: Self-pay | Admitting: Internal Medicine

## 2018-07-14 ENCOUNTER — Other Ambulatory Visit (INDEPENDENT_AMBULATORY_CARE_PROVIDER_SITE_OTHER): Payer: Medicare Other

## 2018-07-14 VITALS — BP 120/80 | HR 64 | Temp 98.0°F | Ht 60.0 in | Wt 176.0 lb

## 2018-07-14 DIAGNOSIS — E7800 Pure hypercholesterolemia, unspecified: Secondary | ICD-10-CM | POA: Diagnosis not present

## 2018-07-14 DIAGNOSIS — R739 Hyperglycemia, unspecified: Secondary | ICD-10-CM

## 2018-07-14 DIAGNOSIS — I10 Essential (primary) hypertension: Secondary | ICD-10-CM | POA: Diagnosis not present

## 2018-07-14 DIAGNOSIS — K7689 Other specified diseases of liver: Secondary | ICD-10-CM

## 2018-07-14 DIAGNOSIS — E669 Obesity, unspecified: Secondary | ICD-10-CM

## 2018-07-14 LAB — COMPREHENSIVE METABOLIC PANEL
ALT: 17 U/L (ref 0–35)
AST: 18 U/L (ref 0–37)
Albumin: 3.9 g/dL (ref 3.5–5.2)
Alkaline Phosphatase: 103 U/L (ref 39–117)
BILIRUBIN TOTAL: 0.5 mg/dL (ref 0.2–1.2)
BUN: 17 mg/dL (ref 6–23)
CHLORIDE: 103 meq/L (ref 96–112)
CO2: 32 meq/L (ref 19–32)
Calcium: 9.7 mg/dL (ref 8.4–10.5)
Creatinine, Ser: 0.47 mg/dL (ref 0.40–1.20)
GFR: 138.82 mL/min (ref >60.00)
GLUCOSE: 94 mg/dL (ref 70–99)
Potassium: 3.9 mEq/L (ref 3.5–5.1)
Sodium: 141 mEq/L (ref 135–145)
Total Protein: 6.8 g/dL (ref 6.0–8.3)

## 2018-07-14 LAB — CBC
HCT: 45 % (ref 36.0–46.0)
Hemoglobin: 14.7 g/dL (ref 12.0–15.0)
MCHC: 32.6 g/dL (ref 30.0–36.0)
MCV: 93.6 fl (ref 78.0–100.0)
Platelets: 270 10*3/uL (ref 150.0–400.0)
RBC: 4.81 Mil/uL (ref 3.87–5.11)
RDW: 14.5 % (ref 11.5–15.5)
WBC: 5.4 10*3/uL (ref 4.0–10.5)

## 2018-07-14 LAB — LIPID PANEL
CHOL/HDL RATIO: 5
Cholesterol: 209 mg/dL — ABNORMAL HIGH (ref 0–200)
HDL: 39 mg/dL — ABNORMAL LOW (ref >39.00)
LDL CALC: 132 mg/dL — AB (ref 0–99)
NonHDL: 169.58
TRIGLYCERIDES: 190 mg/dL — AB (ref 0.0–149.0)
VLDL: 38 mg/dL (ref 0.0–40.0)

## 2018-07-14 LAB — HEMOGLOBIN A1C: Hgb A1c MFr Bld: 5.8 % (ref 4.6–6.5)

## 2018-07-14 NOTE — Assessment & Plan Note (Signed)
Will stop lasix as she is no longer having leg swelling with weight loss. Will adjust losartan to 50 mg daily if labs okay and see back in 6 months for BP check.

## 2018-07-14 NOTE — Assessment & Plan Note (Signed)
Weight is down about 20-30 pounds in the last year and she is still working towards her goal weight of 150. She is using noon with good success.

## 2018-07-14 NOTE — Assessment & Plan Note (Signed)
Will check lipid panel and adjust dosing of pravastatin to 20 mg daily. We talked about her heart risk and with 12.5% 10 year risk she is indicated to stay on statin.

## 2018-07-14 NOTE — Patient Instructions (Addendum)
We will have you stop the lasix and potassium.   We will check the labs and if fine will reduce the dose of losartan to 50 mg (half) and the pravastatin to 20 mg (half).   Come back in about 6 months to check the blood pressure.

## 2018-07-14 NOTE — Progress Notes (Signed)
   Subjective:    Patient ID: Lisa Hamilton, female    DOB: 03/27/1946, 72 y.o.   MRN: [id number]  HPI The patient is a 72 YO female coming in for follow up of her blood pressure (taking lasix and losartan, denies side effects, denies chest pains or headaches) and hyperlipidemia (taking pravastatin 40 mg daily, denies side effects, denies stroke or heart attack symptoms) and pre-diabetes (not taking medications, has lost weight in the last several years through change to diet with noon). She would like to see if she can come off any medicines.   Review of Systems  Constitutional: Negative.   HENT: Negative.   Eyes: Negative.   Respiratory: Negative for cough, chest tightness and shortness of breath.   Cardiovascular: Negative for chest pain, palpitations and leg swelling.  Gastrointestinal: Negative for abdominal distention, abdominal pain, constipation, diarrhea, nausea and vomiting.  Musculoskeletal: Positive for arthralgias and back pain.  Skin: Negative.   Neurological: Negative.   Psychiatric/Behavioral: Negative.       Objective:   Physical Exam  Constitutional: She is oriented to person, place, and time. She appears well-developed and well-nourished.  HENT:  Head: Normocephalic and atraumatic.  Eyes: EOM are normal.  Neck: Normal range of motion.  Cardiovascular: Normal rate and regular rhythm.  Pulmonary/Chest: Effort normal and breath sounds normal. No respiratory distress. She has no wheezes. She has no rales.  Abdominal: Soft. Bowel sounds are normal. She exhibits no distension. There is no tenderness. There is no rebound.  Musculoskeletal: She exhibits no edema.  Neurological: She is alert and oriented to person, place, and time. Coordination abnormal.  Cane  Skin: Skin is warm and dry.  Psychiatric: She has a normal mood and affect.   Vitals:   07/14/18 0826  BP: 120/80  Pulse: 64  Temp: 98 F (36.7 C)  TempSrc: Oral  SpO2: 96%  Weight: 176 lb (79.8 kg)    Height: 5' (1.524 m)      Assessment & Plan:

## 2018-07-14 NOTE — Assessment & Plan Note (Signed)
Checking HgA1c and possible that this is no longer a problem given her significant weight loss.

## 2018-07-14 NOTE — Assessment & Plan Note (Signed)
Still having some pain in the area. Checking CMP today.

## 2018-07-23 ENCOUNTER — Telehealth: Payer: Self-pay | Admitting: Internal Medicine

## 2018-07-23 ENCOUNTER — Encounter: Payer: Self-pay | Admitting: Nurse Practitioner

## 2018-07-23 ENCOUNTER — Ambulatory Visit (INDEPENDENT_AMBULATORY_CARE_PROVIDER_SITE_OTHER): Payer: Medicare Other | Admitting: Nurse Practitioner

## 2018-07-23 VITALS — BP 120/70 | HR 72 | Ht 58.5 in | Wt 180.0 lb

## 2018-07-23 DIAGNOSIS — Z86010 Personal history of colonic polyps: Secondary | ICD-10-CM

## 2018-07-23 DIAGNOSIS — J441 Chronic obstructive pulmonary disease with (acute) exacerbation: Secondary | ICD-10-CM | POA: Diagnosis not present

## 2018-07-23 NOTE — Telephone Encounter (Addendum)
Called and spoke to pt, who is requesting ONO results.  I have spoken to Amy with [facility] and requested that ONO results be faxed to our office.   Will leave message in triage until ONO is received.

## 2018-07-23 NOTE — Patient Instructions (Signed)
Please call Peter Congo, CMA after you hear from pulmonary about need for oxygen.

## 2018-07-23 NOTE — Progress Notes (Signed)
ASSESSMENT / PLAN:   2.  72 year old female with history of multiple (recurrent) adenomatous colon polyps, due for surveillance colonoscopy.  -Patient has COPD and was transiently required home supplemental oxygen following surgery in May (partial hepatectomy).  He has not used the oxygen in over 2 months now.  -Followed by pulmonary, recently underwent overnight oximetry.  Results of study not yet known to patient. -We need to hold off on scheduling colonoscopy until we know whether home oxygen is still necessary for this patient.  If she does require home oxygen then the colonoscopy will need to be done at the hospital, otherwise we will schedule at [facility].  -Patient will call pulmonary for results and get back with [place] on the status of her home oxygen.  -The risks and benefits of colonoscopy with possible polypectomy were discussed and the patient agrees to proceed .   2. Partial hepatectomy in May 2019 (16 cm hepatic cyst) by Dr. Barry Dienes    HPI:    Chief Complaint:   Time for colonoscopy  Patient is a 72 year old female with COPD, obesity, peripheral vascular disease. She was previously followed by Dr. Sharlett Iles, now Dr. Ardis Hughs for history of multiple adenomatous colon polyps. She was due for surveillance colonoscopy in May of this year but was hospitalized at the time for surgery on a 16.4 cm hepatic cyst.  Dr. Barry Dienes performed an open partial hepatectomy.  Based on the discharge summary, patient was on the cusp of needing home oxygen prior to admission .  During the hospital admission her sats were 88% and below while ambulating .  She was discharged home with supplemental oxygen.   Patient  came for colonoscopy previsit in November, at that time she had not used the supplemental oxygen in 2 months.  Pulmonary has been in the process of determining if she still needs the supplemental oxygen.  She recently underwent overnight oximetry but hasn't received results.  Patient  has no GI complaints other than occasional constipation managed with MiraLAX. She has no abdominal pain or blood in stool.   Data Reviewed:  CMET , CBC 07/23/18 - normal   Past Medical History:  Diagnosis Date  . Allergy   . Anemia   . Asthmatic bronchitis   . Blood transfusion without reported diagnosis   . Cataract   . Constipation    chronic  . COPD (chronic obstructive pulmonary disease) ([place])   . Diverticulosis of colon   . DJD (degenerative joint disease)   . GERD (gastroesophageal reflux disease)   . Glaucoma 1977   EYE SURGERY  FOR TX OF GLAUCOMA--NO LONGER HAS GLAUCOMA  . Hepatic cyst    s/p surgical intervention 05/2011 -[place]  . Hiatal hernia   . Hx of colonic polyps   . Hyperlipidemia   . Hypertension   . Kidney stone   . Lipoma   . Medullary sponge kidney   . Nontoxic multinodular goiter    pt unsure of this  . Obesity   . Osteoporosis   . Oxygen deficiency   . Partial small bowel obstruction ([place]) 10/15/2017  . Renal calculus    s/p lithotripsy 06/2011  . Restless leg syndrome   . Shingles 2009  . Squamous cell carcinoma of wrist, right    scalp, right wrist  . Venous insufficiency   . Vitamin D deficiency      Past Surgical History:  Procedure Laterality  Date  . bilateral carpel tunnel repairs     Dr. Shellia Carwin  left--1999   right--2008  . bilateral wrist fracture, right  1996   left 2001, Dr. Daylene Katayama  . CATARACT EXTRACTION  04/07/13 R, 05/18/13 L   shapiro  . COLONOSCOPY    . eye surgery Left 11/22/2014   "MD pucker" repair  . [place]  . [place]  . HERNIA REPAIR  2012   umb hernia  . INSERTION OF MESH N/A 10/15/2017   Procedure: INSERTION OF MESH;  Surgeon: Donnie Mesa, MD;  Location: [place];  Service: General;  Laterality: N/A;  . LAPAROSCOPIC LIVER CYST FENESTRATION  05/2011  . LAPAROSCOPIC PARTIAL HEPATECTOMY N/A 12/04/2017   Procedure: LAPAROSCOPIC PARTIAL HEPATECTOMY;  Surgeon: [name], MD;  Location:  [place];  Service: General;  Laterality: N/A;  . LIPOMA EXCISION  08/13/2012   Procedure: EXCISION LIPOMA;  Surgeon: Magnus Sinning, MD;  Location[facility];  Service: Orthopedics;  Laterality: Left;  . SHOULDER OPEN ROTATOR CUFF REPAIR  08/13/2012   Procedure: ROTATOR CUFF REPAIR SHOULDER OPEN;  Surgeon: Magnus Sinning, MD;  Location: [facility];  Service: Orthopedics;  Laterality: Left;  LEFT SHOULDER ANTERIOR ACROMINECTOMY AND ROTATOR CUFF REPAIR AND EXCISION LEFT SHOULDER LIPOMA  . SQUAMOUS CELL CARCINOMA EXCISION  2010   L [place]  . TONSILLECTOMY  1967  . VENTRAL HERNIA REPAIR N/A 10/15/2017   Procedure: HERNIA REPAIR VENTRAL ADULT;  Surgeon: Donnie Mesa, MD;  Location: [facility];  Service: General;  Laterality: N/A;   Family History  Problem Relation Age of Onset  . Lung cancer Father   . Colon cancer Father        possible colon cancer, but unsure  . Lung cancer Sister   . Irritable bowel syndrome Sister   . Obstructive Sleep Apnea Sister   . Osteoarthritis Sister   . Esophageal cancer Neg Hx   . Rectal cancer Neg Hx   . Stomach cancer Neg Hx    Social History   Tobacco Use  . Smoking status: Never Smoker  . Smokeless tobacco: Never Used  Substance Use Topics  . Alcohol use: Yes    Alcohol/week: 0.0 standard drinks    Comment: social use  . Drug use: No   Current Outpatient Medications  Medication Sig Dispense Refill  . acetaminophen (TYLENOL) 500 MG tablet Take 1,000 mg by mouth 3 (three) times daily as needed for moderate pain or headache.    . albuterol (PROVENTIL) (2.5 MG/3ML) 0.083% nebulizer solution USE 1 VIAL IN NEBULIZER 4 TIMES DAILY. Generic: VENTOLIN 1080 mL 3  . aspirin EC 81 MG tablet Take 162 mg by mouth at bedtime.    . Calcium Carb-Cholecalciferol (CALCIUM 600+D) 600-800 MG-UNIT TABS Take 1 tablet by mouth 2 (two) times daily.    . Cholecalciferol (VITAMIN D) 2000 units tablet Take 2,000 Units by mouth daily.    . clonazePAM (KLONOPIN) 0.5 MG tablet TAKE 1 TO  3 TABLETS BY  MOUTH 30 MINUTES BEFORE BED 270 tablet 1  . Co-Enzyme Q-10 100 MG CAPS Take 100 mg by mouth daily.     Marland Kitchen guaiFENesin (MUCINEX) 600 MG 12 hr tablet Take 600 mg by mouth 2 (two) times daily as needed (congestion).     Marland Kitchen ibuprofen (ADVIL,MOTRIN) 200 MG tablet Take 400 mg by mouth 3 (three) times daily as needed for headache or moderate pain.    Marland Kitchen losartan (COZAAR) 100 MG tablet Take 1 tablet (  100 mg total) by mouth daily. 90 tablet 3  . Magnesium 400 MG CAPS Take 400 mg by mouth 2 (two) times daily.     . meloxicam (MOBIC) 15 MG tablet Take 15 mg by mouth daily.    . montelukast (SINGULAIR) 10 MG tablet Take 1 tablet (10 mg total) by mouth every evening. 90 tablet 3  . Multiple Vitamin (MULTIVITAMIN) capsule Take 1 capsule by mouth daily. Iron free    . Multiple Vitamins-Minerals (PRESERVISION AREDS 2) CAPS Take 1 capsule by mouth 2 (two) times daily.    . Omega-3 Fatty Acids (FISH OIL) 1200 MG CAPS Take 1,200 mg by mouth daily.    . polyethylene glycol (MIRALAX / GLYCOLAX) packet Take 17 g by mouth 3 (three) times daily as needed for moderate constipation.    . pravastatin (PRAVACHOL) 40 MG tablet Take 1 tablet (40 mg total) by mouth daily. 90 tablet 3  . Propylene Glycol (SYSTANE BALANCE OP) Place 1 drop into both eyes daily as needed (dry eyes).    Marland Kitchen rOPINIRole (REQUIP) 0.5 MG tablet Up to 6 daily as needed 540 tablet 3  . traMADol (ULTRAM) 50 MG tablet Take 1 tablet (50 mg total) by mouth daily as needed. (Patient taking differently: Take 50 mg by mouth every 6 (six) hours as needed for moderate pain. ) 90 tablet 0  . OXYGEN Inhale 2 L/min into the lungs. As needed and at bedtime nightly     No current facility-administered medications for this visit.    Allergies  Allergen Reactions  . Penicillins Rash and Other (See Comments)    PATIENT HAS HAD A PCN REACTION WITH IMMEDIATE RASH, FACIAL/TONGUE/THROAT SWELLING, SOB, OR LIGHTHEADEDNESS WITH HYPOTENSION:  #  #  YES  #  #  Has  patient had a PCN reaction causing severe rash involving mucus membranes or skin necrosis: No Has patient had a PCN reaction that required hospitalization: No Has patient had a PCN reaction occurring within the last 10 years: No If all of the above answers are "NO", then may proceed with Cephalosporin use.   Marland Kitchen Hydromorphone Hcl Other (See Comments)    Disorientation and delirium.   . Adhesive [Tape] Rash and Other (See Comments)    Blisters The clear tape   . Amoxicillin-Pot Clavulanate Rash  . Codeine Other (See Comments)    nightmares     Review of Systems: Positive for arthritis, back pain, cough, headaches, restless legs and swelling of feet and legs . All other systems reviewed and negative except where noted in HPI.   Serum creatinine: 0.47 mg/dL 07/14/18 1324 Estimated creatinine clearance: 59 mL/min   Physical Exam:    Wt Readings from Last 3 Encounters:  07/23/18 180 lb (81.6 kg)  07/14/18 176 lb (79.8 kg)  07/07/18 177 lb (80.3 kg)    BP 120/70 (BP Location: Left Arm, Patient Position: Sitting, Cuff Size: Normal)   Pulse 72   Ht 4' 10.5" (1.486 m) Comment: height measured without shoes  Wt 180 lb (81.6 kg)   BMI 36.98 kg/m  Constitutional:  Pleasant female in no acute distress. Psychiatric: Normal mood and affect. Behavior is normal. EENT: Pupils normal.  Conjunctivae are normal. No scleral icterus. Neck supple.  Cardiovascular: Normal rate, regular rhythm. 2+ LLE edema, no RLE edema Pulmonary/chest: Effort normal and breath sounds normal. No wheezing, rales or rhonchi. Abdominal: Soft, nondistended, nontender. Bowel sounds active throughout. There are no masses palpable. No hepatomegaly. Neurological: Alert and oriented to person place  and time. Skin: Skin is warm and dry. No rashes noted.  Tye Savoy, NP  07/23/2018, 8:42 AM

## 2018-07-24 NOTE — Progress Notes (Signed)
I agree with the above note, plan 

## 2018-07-27 ENCOUNTER — Telehealth: Payer: Self-pay | Admitting: Nurse Practitioner

## 2018-07-27 NOTE — Telephone Encounter (Signed)
Patient states she was told by her MD that she could not come off her oxygen at this time. Patient wanting to know if she can still be scheduled for her procedure.

## 2018-07-27 NOTE — Telephone Encounter (Signed)
Called [facility] and spoke with Barbaraann Rondo letting him know that we were supposed to have had pt's ONO results faxed to our office and we still had not received them.  Barbaraann Rondo asked me which fax number to have the results sent to and I told him to send it to triage fax number in [place] attn.  Barbaraann Rondo stated he would have  RT take care of this request now.  Will hold encounter open until we have received ONO results.

## 2018-07-27 NOTE — Telephone Encounter (Signed)
Received fax from [facility] of pt's ONO results. I physically handed results to [facility] who said she would give it to [place].  Routing encounter to Dr.Young.

## 2018-07-27 NOTE — Telephone Encounter (Signed)
The over night O2 test showed that oxygen is still needed during sleep. Her O2 saturation was 88% or less for over 2 hours.

## 2018-07-27 NOTE — Telephone Encounter (Signed)
No results; Lesleigh Noe was to call [place] back and ask the right dept to get ONO results sent to [place]. Please call and have results faxed ASAP to CY and my attention Please.

## 2018-07-27 NOTE — Telephone Encounter (Signed)
Please see note below. Per OV note pt was to call [facility] [place] with this message.

## 2018-07-27 NOTE — Telephone Encounter (Signed)
[name],   Do you know if ONO results have been received from [facility]?   Will route to [place], to follow up

## 2018-07-27 NOTE — Telephone Encounter (Signed)
Called and spoke with patient she is aware and verbalized understanding. Nothing further needed.  

## 2018-07-30 DIAGNOSIS — Z1231 Encounter for screening mammogram for malignant neoplasm of breast: Secondary | ICD-10-CM | POA: Diagnosis not present

## 2018-07-30 LAB — HM MAMMOGRAPHY

## 2018-08-17 ENCOUNTER — Other Ambulatory Visit: Payer: Self-pay

## 2018-08-17 ENCOUNTER — Telehealth: Payer: Self-pay

## 2018-08-17 DIAGNOSIS — Z86010 Personal history of colonic polyps: Secondary | ICD-10-CM

## 2018-08-17 NOTE — Telephone Encounter (Signed)
Spoke with patient this morning regarding her Colonoscopy at [facility] with Dr. Ardis Hughs.  Patient has been scheduled for 09/10/18 at 10:45.  Previsit 08/21/18 at 10:30.  Patient verbalized understanding.

## 2018-08-18 ENCOUNTER — Ambulatory Visit: Payer: Medicare Other | Admitting: Internal Medicine

## 2018-08-21 ENCOUNTER — Ambulatory Visit (AMBULATORY_SURGERY_CENTER): Payer: Self-pay

## 2018-08-21 VITALS — Ht 59.0 in | Wt 182.0 lb

## 2018-08-21 DIAGNOSIS — Z86010 Personal history of colonic polyps: Secondary | ICD-10-CM

## 2018-08-21 MED ORDER — PEG 3350-KCL-NA BICARB-NACL 420 G PO SOLR: 4000.0000 mL | Freq: Once | ORAL | 0 refills | Status: AC

## 2018-08-21 NOTE — Progress Notes (Signed)
Per pt, no allergies to soy or egg products.Pt not taking any weight loss meds. No complications with sedation.  Pt is on  O2 at home only at night!  Pt refused emmi video.

## 2018-08-26 ENCOUNTER — Encounter: Payer: Self-pay | Admitting: Internal Medicine

## 2018-08-26 NOTE — Progress Notes (Signed)
Abstracted and sent to scan  

## 2018-09-06 NOTE — Assessment & Plan Note (Signed)
Requip use.  She has not had obvious symptoms suggesting dementia or Parkinson's so far.

## 2018-09-06 NOTE — Assessment & Plan Note (Signed)
We will get overnight oximetry to see where she is on oxygen needs at night.

## 2018-09-10 ENCOUNTER — Encounter (HOSPITAL_COMMUNITY)
Admission: RE | Disposition: A | Discharge status: Home / Self Care | Payer: Self-pay | Source: Home / Self Care | Attending: Gastroenterology

## 2018-09-10 ENCOUNTER — Ambulatory Visit (HOSPITAL_COMMUNITY): Payer: Medicare Other | Admitting: Anesthesiology

## 2018-09-10 ENCOUNTER — Encounter (HOSPITAL_COMMUNITY): Payer: Self-pay | Admitting: Gastroenterology

## 2018-09-10 ENCOUNTER — Other Ambulatory Visit: Payer: Self-pay

## 2018-09-10 ENCOUNTER — Ambulatory Visit (HOSPITAL_COMMUNITY)
Admission: RE | Admit: 2018-09-10 | Discharge: 2018-09-10 | Disposition: A | Discharge status: Home / Self Care | Payer: Medicare Other | Attending: Gastroenterology | Admitting: Gastroenterology

## 2018-09-10 DIAGNOSIS — Z85828 Personal history of other malignant neoplasm of skin: Secondary | ICD-10-CM | POA: Insufficient documentation

## 2018-09-10 DIAGNOSIS — Z79899 Other long term (current) drug therapy: Secondary | ICD-10-CM | POA: Diagnosis not present

## 2018-09-10 DIAGNOSIS — I10 Essential (primary) hypertension: Secondary | ICD-10-CM | POA: Diagnosis not present

## 2018-09-10 DIAGNOSIS — J449 Chronic obstructive pulmonary disease, unspecified: Secondary | ICD-10-CM | POA: Diagnosis not present

## 2018-09-10 DIAGNOSIS — Z09 Encounter for follow-up examination after completed treatment for conditions other than malignant neoplasm: Secondary | ICD-10-CM | POA: Diagnosis present

## 2018-09-10 DIAGNOSIS — K635 Polyp of colon: Secondary | ICD-10-CM

## 2018-09-10 DIAGNOSIS — E785 Hyperlipidemia, unspecified: Secondary | ICD-10-CM | POA: Insufficient documentation

## 2018-09-10 DIAGNOSIS — E669 Obesity, unspecified: Secondary | ICD-10-CM | POA: Diagnosis not present

## 2018-09-10 DIAGNOSIS — G2581 Restless legs syndrome: Secondary | ICD-10-CM | POA: Diagnosis not present

## 2018-09-10 DIAGNOSIS — Z7982 Long term (current) use of aspirin: Secondary | ICD-10-CM | POA: Insufficient documentation

## 2018-09-10 DIAGNOSIS — E559 Vitamin D deficiency, unspecified: Secondary | ICD-10-CM | POA: Diagnosis not present

## 2018-09-10 DIAGNOSIS — Z860100 Personal history of colon polyps, unspecified: Secondary | ICD-10-CM

## 2018-09-10 DIAGNOSIS — M810 Age-related osteoporosis without current pathological fracture: Secondary | ICD-10-CM | POA: Diagnosis not present

## 2018-09-10 DIAGNOSIS — K5730 Diverticulosis of large intestine without perforation or abscess without bleeding: Secondary | ICD-10-CM | POA: Diagnosis not present

## 2018-09-10 DIAGNOSIS — D122 Benign neoplasm of ascending colon: Secondary | ICD-10-CM | POA: Diagnosis not present

## 2018-09-10 DIAGNOSIS — D649 Anemia, unspecified: Secondary | ICD-10-CM | POA: Diagnosis not present

## 2018-09-10 HISTORY — PX: POLYPECTOMY: SHX5525

## 2018-09-10 HISTORY — PX: COLONOSCOPY WITH PROPOFOL: SHX5780

## 2018-09-10 SURGERY — COLONOSCOPY WITH PROPOFOL
Anesthesia: Monitor Anesthesia Care

## 2018-09-10 MED ORDER — LIDOCAINE 2% (20 MG/ML) 5 ML SYRINGE
INTRAMUSCULAR | Status: DC | PRN
  Administered 2018-09-10: 80 mg via INTRAVENOUS

## 2018-09-10 MED ORDER — PROPOFOL 10 MG/ML IV BOLUS
INTRAVENOUS | Status: DC | PRN
  Administered 2018-09-10 (×2): 20 mg via INTRAVENOUS

## 2018-09-10 MED ORDER — SODIUM CHLORIDE 0.9 % IV SOLN: INTRAVENOUS | Status: DC

## 2018-09-10 MED ORDER — PROPOFOL 500 MG/50ML IV EMUL
INTRAVENOUS | Status: DC | PRN
  Administered 2018-09-10: 100 ug/kg/min via INTRAVENOUS

## 2018-09-10 MED ORDER — PROPOFOL 10 MG/ML IV BOLUS
INTRAVENOUS | Status: AC
  Filled 2018-09-10: qty 40

## 2018-09-10 MED ORDER — LACTATED RINGERS IV SOLN
INTRAVENOUS | Status: DC
  Administered 2018-09-10: 1000 mL via INTRAVENOUS

## 2018-09-10 SURGICAL SUPPLY — 21 items

## 2018-09-10 NOTE — Anesthesia Postprocedure Evaluation (Signed)
Anesthesia Post Note  Patient: Lisa Hamilton  Procedure(s) Performed: COLONOSCOPY WITH PROPOFOL (N/A ) POLYPECTOMY     Patient location during evaluation: Endoscopy Anesthesia Type: MAC Level of consciousness: awake and alert Pain management: pain level controlled Vital Signs Assessment: post-procedure vital signs reviewed and stable Respiratory status: spontaneous breathing, nonlabored ventilation, respiratory function stable and patient connected to nasal cannula oxygen Cardiovascular status: blood pressure returned to baseline and stable Postop Assessment: no apparent nausea or vomiting Anesthetic complications: no    Last Vitals:  Vitals:   09/10/18 0900 09/10/18 0910  BP: (!) 118/57 127/61  Pulse: 69 69  Resp: (!) 21 19  Temp: 36.5 C   SpO2: 100% 97%    Last Pain:  Vitals:   09/10/18 0910  TempSrc:   PainSc: 0-No pain                 Cristen Murcia DANIEL

## 2018-09-10 NOTE — Transfer of Care (Signed)
Immediate Anesthesia Transfer of Care Note  Patient: Lisa Hamilton  Procedure(s) Performed: Procedure(s): COLONOSCOPY WITH PROPOFOL (N/A) POLYPECTOMY  Patient Location: PACU  Anesthesia Type:MAC  Level of Consciousness: Patient easily awoken, sedated, comfortable, cooperative, following commands, responds to stimulation.   Airway & Oxygen Therapy: Patient spontaneously breathing, ventilating well, oxygen via simple oxygen mask.  Post-op Assessment: Report given to PACU RN, vital signs reviewed and stable, moving all extremities.   Post vital signs: Reviewed and stable.  Complications: No apparent anesthesia complications  Last Vitals:  Vitals Value Taken Time  BP    Temp    Pulse 67 09/10/2018  8:59 AM  Resp 17 09/10/2018  8:59 AM  SpO2 100 % 09/10/2018  8:59 AM  Vitals shown include unvalidated device data.  Last Pain:  Vitals:   09/10/18 0712  TempSrc: Oral  PainSc: 0-No pain         Complications: No apparent anesthesia complications

## 2018-09-10 NOTE — Discharge Instructions (Signed)

## 2018-09-10 NOTE — Anesthesia Procedure Notes (Signed)
Procedure Name: MAC Date/Time: 09/10/2018 8:37 AM Performed by: Deliah Boston, CRNA Pre-anesthesia Checklist: Patient identified, Emergency Drugs available, Suction available and Patient being monitored Patient Re-evaluated:Patient Re-evaluated prior to induction Oxygen Delivery Method: Simple face mask Preoxygenation: Pre-oxygenation with 100% oxygen Induction Type: IV induction Placement Confirmation: positive ETCO2 and breath sounds checked- equal and bilateral

## 2018-09-10 NOTE — H&P (Signed)
HPI: This is a 73 yo woman with history of adenomatous polyps  Chief complaint is adenomatous polyps, on oxygen chronically  ROS: complete GI ROS as described in HPI, all other review negative.  Constitutional:  No unintentional weight loss   Past Medical History:  Diagnosis Date  . Allergy   . Anemia   . Asthmatic bronchitis   . Blood transfusion without reported diagnosis 12/2017  . Cataract   . Constipation    chronic  . COPD (chronic obstructive pulmonary disease) ([place])   . Diverticulosis of colon   . DJD (degenerative joint disease)   . GERD (gastroesophageal reflux disease)    rare  . Glaucoma 1977   EYE SURGERY  FOR TX OF GLAUCOMA--NO LONGER HAS GLAUCOMA  . Hepatic cyst    s/p surgical intervention 05/2011 -[place]  . Hiatal hernia   . Hx of colonic polyps   . Hyperlipidemia   . Hypertension   . Kidney stone   . Lipoma   . Medullary sponge kidney   . Nontoxic multinodular goiter    pt unsure of this/ per pt no one told her!  . Obesity   . Osteoporosis   . Oxygen deficiency   . Partial small bowel obstruction ([place]) 10/15/2017  . Renal calculus    s/p lithotripsy 06/2011  . Restless leg syndrome   . Shingles 2009  . Squamous cell carcinoma of wrist, right    scalp, right wrist  . Venous insufficiency   . Vitamin D deficiency     Past Surgical History:  Procedure Laterality Date  . bilateral carpel tunnel repairs     Dr. Shellia Carwin  left--1999   right--2008  . bilateral wrist fracture, right  1996   left 2001, Dr. Daylene Katayama  . CATARACT EXTRACTION  04/07/13 R, 05/18/13 L   [place]  . COLONOSCOPY    . eye surgery Left 11/22/2014   "MD pucker" repair  . FOOT SURGERY     left foot surgery/ cut off part of the bone due to cleft foot  . [place]  . [place]  . HERNIA REPAIR  2012   umb hernia/ pt states it was done in 10/2017 again  . INSERTION OF MESH N/A 10/15/2017   Procedure: INSERTION OF MESH;  Surgeon: Donnie Mesa, MD;   Location: [place];  Service: General;  Laterality: N/A;  . LAPAROSCOPIC LIVER CYST FENESTRATION  05/2011  . LAPAROSCOPIC PARTIAL HEPATECTOMY N/A 12/04/2017   Procedure: LAPAROSCOPIC PARTIAL HEPATECTOMY;  Surgeon: [name], MD;  Location: [place];  Service: General;  Laterality: N/A;  . LIPOMA EXCISION  08/13/2012   Procedure: EXCISION LIPOMA;  Surgeon: Magnus Sinning, MD;  [facility];  Service: Orthopedics;  Laterality: Left;  . LITHOTRIPSY  06/16/2018  . SHOULDER OPEN ROTATOR CUFF REPAIR  08/13/2012   Procedure: ROTATOR CUFF REPAIR SHOULDER OPEN;  Surgeon: Magnus Sinning, MD;  [facility];  Service: Orthopedics;  Laterality: Left;  LEFT SHOULDER ANTERIOR ACROMINECTOMY AND ROTATOR CUFF REPAIR AND EXCISION LEFT SHOULDER LIPOMA  . SQUAMOUS CELL CARCINOMA EXCISION  2010   L [place]  . TONSILLECTOMY  1967  . VENTRAL HERNIA REPAIR N/A 10/15/2017   Procedure: HERNIA REPAIR VENTRAL ADULT;  Surgeon: Donnie Mesa, MD;  Location: [place];  Service: General;  Laterality: N/A;    Current Facility-Administered Medications  Medication Dose Route Frequency Provider Last Rate Last Dose  . 0.9 %  sodium chloride infusion   Intravenous Continuous Owens Loffler  P, MD      . lactated ringers infusion   Intravenous Continuous Milus Banister, MD        Allergies as of 08/17/2018 - Review Complete 07/23/2018  Allergen Reaction Noted  . Penicillins Rash and Other (See Comments) 06/03/2013  . Hydromorphone hcl Other (See Comments) 12/06/2017  . Adhesive [tape] Rash and Other (See Comments) 06/26/2011  . Amoxicillin-pot clavulanate Rash 07/07/2009  . Codeine Other (See Comments) 07/07/2009    Family History  Problem Relation Age of Onset  . Lung cancer Father   . Colon cancer Father        possible colon cancer, but unsure  . Lung cancer Sister   . Irritable bowel syndrome Sister   . Obstructive Sleep Apnea Sister   . Osteoarthritis Sister   . Esophageal cancer Neg Hx   . Rectal cancer Neg  Hx   . Stomach cancer Neg Hx     Social History   Socioeconomic History  . Marital status: Married    Spouse name: Fritz Pickerel  . Number of children: 2  . Years of education: Not on file  . Highest education level: Not on file  Occupational History  . 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 Financial resource strain: Not hard at all  . Food insecurity:    Worry: Never true    Inability: Never true  . Transportation needs:    Medical: No    Non-medical: No  Tobacco Use  . Smoking status: Never Smoker  . Smokeless tobacco: Never Used  Substance and Sexual Activity  . Alcohol use: Yes    Alcohol/week: 0.0 standard drinks    Comment: social use  . Drug use: No  . Sexual activity: Never  Lifestyle  . Physical activity:    Days per week: 3 days    Minutes per session: 60 min  . Stress: Not at all  Relationships  . Social connections:    Talks on phone: More than three times a week    Gets together: More than three times a week    Attends religious service: More than 4 times per year    Active member of club or organization: Yes    Attends meetings of clubs or organizations: More than 4 times per year    Relationship status: Married  . Intimate partner violence:    Fear of current or ex partner: No    Emotionally abused: No    Physically abused: No    Forced sexual activity: No  Other Topics Concern  . Not on file  Social History Narrative  . Not on file     Physical Exam: There were no vitals taken for this visit. Constitutional: generally well-appearing Psychiatric: alert and oriented x3 Abdomen: soft, nontender, nondistended, no obvious ascites, no peritoneal signs, normal bowel sounds No peripheral edema noted in lower extremities  Assessment and plan: 73 y.o. female with history of polyips  Colonoscopy today  Please see the "Patient Instructions" section for addition details about the plan.  Owens Loffler,  MD [facility] 09/10/2018, 7:09 AM

## 2018-09-10 NOTE — Anesthesia Preprocedure Evaluation (Signed)
Anesthesia Evaluation  Patient identified by MRN, date of birth, ID band Patient awake    Reviewed: Allergy & Precautions, NPO status , Patient's Chart, lab work & pertinent test results  History of Anesthesia Complications Negative for: history of anesthetic complications  Airway Mallampati: II  TM Distance: >3 FB Neck ROM: Full    Dental no notable dental hx. (+) Dental Advisory Given   Pulmonary asthma , COPD,    Pulmonary exam normal        Cardiovascular hypertension, Pt. on medications negative cardio ROS Normal cardiovascular exam     Neuro/Psych negative neurological ROS  negative psych ROS   GI/Hepatic Neg liver ROS, hiatal hernia, GERD  ,  Endo/Other  Morbid obesity  Renal/GU negative Renal ROS  negative genitourinary   Musculoskeletal negative musculoskeletal ROS (+)   Abdominal   Peds negative pediatric ROS (+)  Hematology negative hematology ROS (+)   Anesthesia Other Findings   Reproductive/Obstetrics negative OB ROS                             Anesthesia Physical Anesthesia Plan  ASA: III  Anesthesia Plan: MAC   Post-op Pain Management:    Induction:   PONV Risk Score and Plan: 2 and Ondansetron and Propofol infusion  Airway Management Planned: Simple Face Mask and Natural Airway  Additional Equipment:   Intra-op Plan:   Post-operative Plan:   Informed Consent: I have reviewed the patients History and Physical, chart, labs and discussed the procedure including the risks, benefits and alternatives for the proposed anesthesia with the patient or authorized representative who has indicated his/her understanding and acceptance.     Dental advisory given  Plan Discussed with: Anesthesiologist  Anesthesia Plan Comments:         Anesthesia Quick Evaluation

## 2018-09-10 NOTE — Op Note (Signed)
[facility] Patient Name: Lisa Hamilton Procedure Date: 09/10/2018 MRN: [id number] Attending MD: Milus Banister , MD Date of Birth: May 18, 1946 CSN: [id number] Age: 73 Admit Type: Outpatient Procedure:                Colonoscopy Indications:              High risk colon cancer surveillance: Personal                            history of colonic polyps; multiple adenomatous                            polyps removed over many years by Dr. Sharlett Iles;                            colonoscopy 2016 Dr. Ardis Hughs found 3 adenomatous                            polyps, one was 74mm Providers:                Milus Banister, MD, Burtis Junes, RN, Cherylynn Ridges,                            Technician, Heide Scales, CRNA Referring MD:              Medicines:                Monitored Anesthesia Care Complications:            No immediate complications. Estimated blood loss:                            None. Estimated Blood Loss:     Estimated blood loss: none. Procedure:                Pre-Anesthesia Assessment:                           - Prior to the procedure, a History and Physical                            was performed, and patient medications and                            allergies were reviewed. The patient's tolerance of                            previous anesthesia was also reviewed. The risks                            and benefits of the procedure and the sedation                            options and risks were discussed with the patient.                            All  questions were answered, and informed consent                            was obtained. Prior Anticoagulants: The patient has                            taken no previous anticoagulant or antiplatelet                            agents. ASA Grade Assessment: IV - A patient with                            severe systemic disease that is a constant threat                            to life. After reviewing the  risks and benefits,                            the patient was deemed in satisfactory condition to                            undergo the procedure.                           After obtaining informed consent, the colonoscope                            was passed under direct vision. Throughout the                            procedure, the patient's blood pressure, pulse, and                            oxygen saturations were monitored continuously. The                            CF-HQ190L ([id number]) Olympus colonoscope was                            introduced through the anus and advanced to the the                            cecum, identified by appendiceal orifice and                            ileocecal valve. The colonoscopy was performed                            without difficulty. The patient tolerated the                            procedure well. The quality of the bowel  preparation was good. The ileocecal valve,                            appendiceal orifice, and rectum were photographed. Scope In: 8:41:56 AM Scope Out: 8:52:03 AM Scope Withdrawal Time: 0 hours 8 minutes 8 seconds  Total Procedure Duration: 0 hours 10 minutes 7 seconds  Findings:      A 3 mm polyp was found in the ascending colon. The polyp was sessile.       The polyp was removed with a cold snare. Resection and retrieval were       complete.      Multiple small and large-mouthed diverticula were found in the left       colon.      The exam was otherwise without abnormality on direct and retroflexion       views. Impression:               - One 3 mm polyp in the ascending colon, removed                            with a cold snare. Resected and retrieved.                           - Diverticulosis in the left colon.                           - The examination was otherwise normal on direct                            and retroflexion views. Moderate Sedation:      Not Applicable  - Patient had care per Anesthesia. Recommendation:           - Patient has a contact number available for                            emergencies. The signs and symptoms of potential                            delayed complications were discussed with the                            patient. Return to normal activities tomorrow.                            Written discharge instructions were provided to the                            patient.                           - Resume previous diet.                           - Continue present medications.                           You will receive a letter within  2-3 weeks with the                            pathology results and my final recommendations.                           If the polyp(s) is proven to be 'pre-cancerous' on                            pathology, you will need repeat colonoscopy in 5                            years. Procedure Code(s):        --- Professional ---                           [phone], Colonoscopy, flexible; with removal of                            tumor(s), polyp(s), or other lesion(s) by snare                            technique Diagnosis Code(s):        --- Professional ---                           Z86.010, Personal history of colonic polyps                           D12.2, Benign neoplasm of ascending colon                           K57.30, Diverticulosis of large intestine without                            perforation or abscess without bleeding CPT copyright 2018 [employer]. All rights reserved. The codes documented in this report are preliminary and upon coder review may  be revised to meet current compliance requirements. Milus Banister, MD 09/10/2018 8:56:15 AM This report has been signed electronically. Number of Addenda: 0

## 2018-09-11 ENCOUNTER — Encounter (HOSPITAL_COMMUNITY): Payer: Self-pay | Admitting: Gastroenterology

## 2018-10-07 ENCOUNTER — Other Ambulatory Visit: Payer: Self-pay | Admitting: Internal Medicine

## 2018-10-09 DIAGNOSIS — H35371 Puckering of macula, right eye: Secondary | ICD-10-CM | POA: Diagnosis not present

## 2018-10-09 DIAGNOSIS — H35363 Drusen (degenerative) of macula, bilateral: Secondary | ICD-10-CM | POA: Diagnosis not present

## 2018-10-09 DIAGNOSIS — H35453 Secondary pigmentary degeneration, bilateral: Secondary | ICD-10-CM | POA: Diagnosis not present

## 2018-10-09 DIAGNOSIS — H353131 Nonexudative age-related macular degeneration, bilateral, early dry stage: Secondary | ICD-10-CM | POA: Diagnosis not present

## 2018-10-09 DIAGNOSIS — Z961 Presence of intraocular lens: Secondary | ICD-10-CM | POA: Diagnosis not present

## 2018-10-09 DIAGNOSIS — H35342 Macular cyst, hole, or pseudohole, left eye: Secondary | ICD-10-CM | POA: Diagnosis not present

## 2018-10-09 DIAGNOSIS — H3122 Choroidal dystrophy (central areolar) (generalized) (peripapillary): Secondary | ICD-10-CM | POA: Diagnosis not present

## 2018-10-19 DIAGNOSIS — N3000 Acute cystitis without hematuria: Secondary | ICD-10-CM | POA: Diagnosis not present

## 2018-10-19 DIAGNOSIS — N3281 Overactive bladder: Secondary | ICD-10-CM | POA: Diagnosis not present

## 2018-10-19 DIAGNOSIS — N200 Calculus of kidney: Secondary | ICD-10-CM | POA: Diagnosis not present

## 2018-10-21 ENCOUNTER — Other Ambulatory Visit: Payer: Self-pay | Admitting: Internal Medicine

## 2018-10-21 NOTE — Telephone Encounter (Signed)
Is patient to be taking 100mg  or 50mg ?

## 2018-11-10 DIAGNOSIS — N3000 Acute cystitis without hematuria: Secondary | ICD-10-CM | POA: Diagnosis not present

## 2018-12-29 ENCOUNTER — Ambulatory Visit: Payer: Medicare Other | Admitting: Internal Medicine

## 2019-01-06 DIAGNOSIS — M5136 Other intervertebral disc degeneration, lumbar region: Secondary | ICD-10-CM | POA: Diagnosis not present

## 2019-01-06 DIAGNOSIS — M545 Low back pain: Secondary | ICD-10-CM | POA: Diagnosis not present

## 2019-01-06 DIAGNOSIS — G894 Chronic pain syndrome: Secondary | ICD-10-CM | POA: Diagnosis not present

## 2019-01-08 ENCOUNTER — Encounter: Payer: Self-pay | Admitting: Internal Medicine

## 2019-01-08 ENCOUNTER — Ambulatory Visit (INDEPENDENT_AMBULATORY_CARE_PROVIDER_SITE_OTHER): Payer: Medicare Other | Admitting: Internal Medicine

## 2019-01-08 ENCOUNTER — Other Ambulatory Visit: Payer: Self-pay

## 2019-01-08 ENCOUNTER — Other Ambulatory Visit (INDEPENDENT_AMBULATORY_CARE_PROVIDER_SITE_OTHER): Payer: Medicare Other

## 2019-01-08 VITALS — BP 120/80 | HR 60 | Temp 98.0°F | Ht 59.0 in | Wt 192.0 lb

## 2019-01-08 DIAGNOSIS — J4530 Mild persistent asthma, uncomplicated: Secondary | ICD-10-CM

## 2019-01-08 DIAGNOSIS — I10 Essential (primary) hypertension: Secondary | ICD-10-CM

## 2019-01-08 DIAGNOSIS — E7800 Pure hypercholesterolemia, unspecified: Secondary | ICD-10-CM | POA: Diagnosis not present

## 2019-01-08 DIAGNOSIS — R739 Hyperglycemia, unspecified: Secondary | ICD-10-CM

## 2019-01-08 LAB — COMPREHENSIVE METABOLIC PANEL
ALT: 16 U/L (ref 0–35)
AST: 15 U/L (ref 0–37)
Albumin: 4 g/dL (ref 3.5–5.2)
Alkaline Phosphatase: 88 U/L (ref 39–117)
BUN: 21 mg/dL (ref 6–23)
CO2: 30 mEq/L (ref 19–32)
Calcium: 9.1 mg/dL (ref 8.4–10.5)
Chloride: 105 mEq/L (ref 96–112)
Creatinine, Ser: 0.52 mg/dL (ref 0.40–1.20)
GFR: 116.06 mL/min (ref >60.00)
Glucose, Bld: 91 mg/dL (ref 70–99)
Potassium: 4.3 mEq/L (ref 3.5–5.1)
Sodium: 143 mEq/L (ref 135–145)
Total Bilirubin: 0.5 mg/dL (ref 0.2–1.2)
Total Protein: 6.7 g/dL (ref 6.0–8.3)

## 2019-01-08 LAB — CBC
HCT: 43.9 % (ref 36.0–46.0)
Hemoglobin: 14.7 g/dL (ref 12.0–15.0)
MCHC: 33.4 g/dL (ref 30.0–36.0)
MCV: 94.5 fl (ref 78.0–100.0)
Platelets: 222 10*3/uL (ref 150.0–400.0)
RBC: 4.64 Mil/uL (ref 3.87–5.11)
RDW: 13.4 % (ref 11.5–15.5)
WBC: 5.6 10*3/uL (ref 4.0–10.5)

## 2019-01-08 LAB — LIPID PANEL
Cholesterol: 221 mg/dL — ABNORMAL HIGH (ref 0–200)
HDL: 54.8 mg/dL (ref >39.00)
LDL Cholesterol: 148 mg/dL — ABNORMAL HIGH (ref 0–99)
NonHDL: 166.63
Total CHOL/HDL Ratio: 4
Triglycerides: 93 mg/dL (ref 0.0–149.0)
VLDL: 18.6 mg/dL (ref 0.0–40.0)

## 2019-01-08 LAB — HEMOGLOBIN A1C: Hgb A1c MFr Bld: 5.9 % (ref 4.6–6.5)

## 2019-01-08 MED ORDER — FLUTICASONE PROPIONATE 50 MCG/ACT NA SUSP: 2.0000 | Freq: Every day | NASAL | 6 refills | Status: AC

## 2019-01-08 NOTE — Assessment & Plan Note (Signed)
Taking pravastatin 40 mg daily and checking lipid panel and adjust as needed. She would like to decrease dosing as able.

## 2019-01-08 NOTE — Patient Instructions (Signed)
We will check the antibody for coronavirus today.   We will send you the results.   The number for the health department for screening for coronavirus is [phone]

## 2019-01-08 NOTE — Assessment & Plan Note (Signed)
HgA1c today and adjust as needed.

## 2019-01-08 NOTE — Assessment & Plan Note (Signed)
Checking covid-19 antibody testing per patient request and we discussed the limitations and potential benefits of testing. She wishes to continue.

## 2019-01-08 NOTE — Assessment & Plan Note (Addendum)
BMI 38 but complicated by hyperlipidemia, hypertension and osteoarthritis. Counseled about exercise and diet modifications to help with weight loss.

## 2019-01-08 NOTE — Assessment & Plan Note (Signed)
BP at goal and checking CMP and CBC today. Adjust losartan 50 mg daily as needed.

## 2019-01-08 NOTE — Progress Notes (Signed)
   Subjective:   Patient ID: Lisa Hamilton, female    DOB: 1946-04-10, 73 y.o.   MRN: [id number]  HPI The patient is a 73 YO female coming in for follow up of morbid obesity (weight is overall down since last year but up since last visit by about 10 pounds, she is not exercising as much due to covid-19, her water aerobics is not on and she cannot walk in [employer] currently, food is about the same) and hypertension (taking losartan 50 mg daily and BP at goal, denies chest pains or headaches, denies fevers or chills or cough, denies side effects, stopped lasix successfully after last visit) and hyperlipidemia (taking her pravastatin and wants to know if she can decrease the dose as she has made some lifestyle changes in the last several months, denies side effects, denies chest pains or stroke symptoms). She is also concerned about covid-19 and has concerns and questions. Had an illness in February which she is thinking could have been this. Given her asthma she would like to know if she has been exposed in the past.   Review of Systems  Constitutional: Negative.   HENT: Positive for sinus pressure.   Eyes: Negative.   Respiratory: Positive for shortness of breath. Negative for cough and chest tightness.        Stable and chronic  Cardiovascular: Negative for chest pain, palpitations and leg swelling.  Gastrointestinal: Negative for abdominal distention, abdominal pain, constipation, diarrhea, nausea and vomiting.  Musculoskeletal: Positive for arthralgias and gait problem.  Skin: Negative.   Neurological: Negative for dizziness, tremors, seizures, light-headedness and headaches.  Psychiatric/Behavioral: Negative.     Objective:  Physical Exam Constitutional:      Appearance: She is well-developed. She is obese.  HENT:     Head: Normocephalic and atraumatic.  Neck:     Musculoskeletal: Normal range of motion.  Cardiovascular:     Rate and Rhythm: Normal rate and regular rhythm.   Pulmonary:     Effort: Pulmonary effort is normal. No respiratory distress.     Breath sounds: Normal breath sounds. No wheezing or rales.  Abdominal:     General: Bowel sounds are normal. There is no distension.     Palpations: Abdomen is soft.     Tenderness: There is no abdominal tenderness. There is no rebound.  Skin:    General: Skin is warm and dry.  Neurological:     Mental Status: She is alert and oriented to person, place, and time.     Coordination: Coordination abnormal.     Comments: Steady gait, cane     Vitals:   01/08/19 0842  BP: 120/80  Pulse: 60  Temp: 98 F (36.7 C)  TempSrc: Oral  SpO2: 94%  Weight: 192 lb (87.1 kg)  Height: 4\' 11"  (1.499 m)    Assessment & Plan:

## 2019-01-11 LAB — SAR COV2 SEROLOGY (COVID19)AB(IGG),IA: SARS CoV2 AB IGG: NEGATIVE

## 2019-01-13 ENCOUNTER — Ambulatory Visit: Payer: Medicare Other | Admitting: Internal Medicine

## 2019-01-27 ENCOUNTER — Telehealth: Payer: Self-pay | Admitting: Internal Medicine

## 2019-01-27 DIAGNOSIS — J9611 Chronic respiratory failure with hypoxia: Secondary | ICD-10-CM

## 2019-01-27 NOTE — Telephone Encounter (Signed)
Order has been signed. Will return to [facility] for f/u and route this message back to her.

## 2019-01-27 NOTE — Telephone Encounter (Signed)
Placed in envelope with patient's verified home address and placed in outgoing mail.  Nothing further needed; will sign off.

## 2019-01-27 NOTE — Telephone Encounter (Signed)
Called spoke with patient who is requesting an order for a new nebulizer machine and tubing/supplies.  Patient would like this order printed and mailed to her (verified) home address.  Patient requested to NOT have the DME name entered into the order in case she finds another company that is cheaper.  Patient requests this order be hand-signed by Dr. Annamaria Boots.  Order pended and routed to Dr Annamaria Boots and Raquel Sarna.

## 2019-01-27 NOTE — Telephone Encounter (Signed)
Script signed.

## 2019-01-27 NOTE — Telephone Encounter (Signed)
Order for new nebulizer machine has been printed. Will place prescription in CY's look-at and keep encounter open to f/u on once it has been signed.

## 2019-02-04 DIAGNOSIS — Z961 Presence of intraocular lens: Secondary | ICD-10-CM | POA: Diagnosis not present

## 2019-02-04 DIAGNOSIS — H35372 Puckering of macula, left eye: Secondary | ICD-10-CM | POA: Diagnosis not present

## 2019-02-04 DIAGNOSIS — H353131 Nonexudative age-related macular degeneration, bilateral, early dry stage: Secondary | ICD-10-CM | POA: Diagnosis not present

## 2019-03-11 ENCOUNTER — Other Ambulatory Visit: Payer: Self-pay | Admitting: Internal Medicine

## 2019-03-11 NOTE — Telephone Encounter (Signed)
Refill e-sent 

## 2019-03-11 NOTE — Telephone Encounter (Signed)
Is this appropriate for refill ? 

## 2019-04-01 ENCOUNTER — Other Ambulatory Visit: Payer: Self-pay | Admitting: Internal Medicine

## 2019-04-05 ENCOUNTER — Ambulatory Visit: Payer: Medicare Other | Admitting: Internal Medicine

## 2019-04-08 DIAGNOSIS — H35453 Secondary pigmentary degeneration, bilateral: Secondary | ICD-10-CM | POA: Diagnosis not present

## 2019-04-08 DIAGNOSIS — H353130 Nonexudative age-related macular degeneration, bilateral, stage unspecified: Secondary | ICD-10-CM | POA: Diagnosis not present

## 2019-04-08 DIAGNOSIS — Z961 Presence of intraocular lens: Secondary | ICD-10-CM | POA: Diagnosis not present

## 2019-04-08 DIAGNOSIS — H35721 Serous detachment of retinal pigment epithelium, right eye: Secondary | ICD-10-CM | POA: Diagnosis not present

## 2019-04-08 DIAGNOSIS — H35352 Cystoid macular degeneration, left eye: Secondary | ICD-10-CM | POA: Diagnosis not present

## 2019-04-08 DIAGNOSIS — H35343 Macular cyst, hole, or pseudohole, bilateral: Secondary | ICD-10-CM | POA: Diagnosis not present

## 2019-05-10 DIAGNOSIS — Z961 Presence of intraocular lens: Secondary | ICD-10-CM | POA: Diagnosis not present

## 2019-05-10 DIAGNOSIS — H35343 Macular cyst, hole, or pseudohole, bilateral: Secondary | ICD-10-CM | POA: Diagnosis not present

## 2019-05-10 DIAGNOSIS — H35373 Puckering of macula, bilateral: Secondary | ICD-10-CM | POA: Diagnosis not present

## 2019-05-10 DIAGNOSIS — H35453 Secondary pigmentary degeneration, bilateral: Secondary | ICD-10-CM | POA: Diagnosis not present

## 2019-05-10 DIAGNOSIS — H35363 Drusen (degenerative) of macula, bilateral: Secondary | ICD-10-CM | POA: Diagnosis not present

## 2019-05-10 DIAGNOSIS — H353130 Nonexudative age-related macular degeneration, bilateral, stage unspecified: Secondary | ICD-10-CM | POA: Diagnosis not present

## 2019-05-10 DIAGNOSIS — H35721 Serous detachment of retinal pigment epithelium, right eye: Secondary | ICD-10-CM | POA: Diagnosis not present

## 2019-05-11 DIAGNOSIS — Z23 Encounter for immunization: Secondary | ICD-10-CM | POA: Diagnosis not present

## 2019-05-18 ENCOUNTER — Other Ambulatory Visit: Payer: Self-pay

## 2019-05-18 ENCOUNTER — Encounter: Payer: Self-pay | Admitting: Internal Medicine

## 2019-05-18 ENCOUNTER — Ambulatory Visit (INDEPENDENT_AMBULATORY_CARE_PROVIDER_SITE_OTHER): Payer: Medicare Other | Admitting: Internal Medicine

## 2019-05-18 DIAGNOSIS — G2581 Restless legs syndrome: Secondary | ICD-10-CM | POA: Diagnosis not present

## 2019-05-18 DIAGNOSIS — J4530 Mild persistent asthma, uncomplicated: Secondary | ICD-10-CM

## 2019-05-18 DIAGNOSIS — J9611 Chronic respiratory failure with hypoxia: Secondary | ICD-10-CM | POA: Diagnosis not present

## 2019-05-18 MED ORDER — MONTELUKAST SODIUM 10 MG PO TABS: 10.0000 mg | ORAL_TABLET | Freq: Every evening | ORAL | 3 refills | Status: DC

## 2019-05-18 MED ORDER — BREO ELLIPTA 100-25 MCG/INH IN AEPB: INHALATION_SPRAY | RESPIRATORY_TRACT | 12 refills | Status: DC

## 2019-05-18 MED ORDER — CLONAZEPAM 0.5 MG PO TABS: ORAL_TABLET | ORAL | 1 refills | Status: DC

## 2019-05-18 NOTE — Progress Notes (Signed)
HPI female never smoker followed for Restless Legs, back pain that disturbs sleep, asthma, complicated by peripheral venous disease, hemochromatosis  ----------------------------------------------------------------------  06/30/2018- 73 year old female never smoker followed for Restless Legs, back pain that disturbs sleep, asthma, complicated by peripheral venous disease, hemochromatosis/ Fe def anemia, O2 2l Advanced sleep and prn  Using a self-fill system ----4 mo f/u for asthma O2 was started with hospitalization after abdominal surgeries for partial bowel obstruction, hernia repair and hepatic cyst resection May, 2019. Admits she has not used oxygen in a month and hopes she can be free of it at night-discussed. She wants to have colonoscopy done as an outpatient.  If she requires oxygen it would need to be done in hospital. Doing water aerobics 3 days/week. Requip 0.5 mg, Neurontin 300 mg, clonazepam 0.5 mg, neb albuterol, Singulair, She thinks her breathing is doing really well now.  Asks about weaning off of gabapentin which was prescribed for cough. Using Requip 1 in the morning, 1 in the afternoon and 2 in the evening then 1 at bedtime, up to a total of 6/day.  Right now that seems to be enough.  05/18/2019- 73 year old female never smoker followed for Restless Legs, back pain that disturbs sleep, Asthma, complicated by peripheral venous disease, hemochromatosis/ Fe def anemia, O2 2l Advanced sleep and prn  Using a self-fill system -----pt reports increased wheezing since last office visit, cough and congestion 2w ago resolved by Mucinex, been taking nebulizer 3-4 times daily; on O2 2L at night; pt states for the last 3-4 months she has been waking up more d/t RLS Requip 0.5 mg, Neurontin 300 mg, clonazepam 0.5 mg, neb albuterol, Singulair, Hgb 14.7 on 6/5 Waking more at night- legs hurt, sometimes needs hot shower. Also has pinched nerve from scoliosis. Had stopped gabapentin but has  some and can try taking at bedtime to see if it helps legs.  Husband comments on wheeze she doesn't notice. Using neb 3-4x/ daily          ROS-see HPI + = positive Constitutional:   No-   weight loss, night sweats, fevers, chills, +fatigue, lassitude. HEENT:   No-  headaches, difficulty swallowing, tooth/dental problems, sore throat,       No-  sneezing, itching, ear ache, nasal congestion, post nasal drip,  CV:  No-   chest pain, orthopnea, PND, swelling in lower extremities, anasarca,                                                     dizziness, palpitations Resp: + shortness of breath with exertion or at rest.              No-   productive cough,  No non-productive cough,  No- coughing up of blood.              No-   change in color of mucus. + wheezing.   Skin: No-   rash or lesions. GI:  No-   heartburn, indigestion, abdominal pain, nausea, vomiting,  GU:     MS:  No-   joint pain or swelling.  No- decreased range of motion.  + back pain, leg pain. Neuro-     + per HPI Psych:  No- change in mood or affect. No depression or anxiety.  No memory loss.  OBJ- Physical Exam General- Alert,  Oriented, Affect-appropriate, Distress- none acute, +obese Skin- rash-none, lesions- none, excoriation- none Lymphadenopathy- none Head- atraumatic            Eyes- Gross vision intact, PERRLA, conjunctivae and secretions clear            Ears- Hearing, canals-normal            Nose- Clear, no-Septal dev, mucus, polyps, erosion, perforation             Throat- Mallampati II-III , mucosa clear , drainage- none, tonsils- atrophic Neck- flexible , trachea midline, no stridor , thyroid nl, carotid no bruit Chest - symmetrical excursion , unlabored           Heart/CV- RRR , no murmur , no gallop  , no rub, nl s1 s2                           - JVD+2cm , edema +2, stasis changes- none,  superficial varices +           Lung- clear to P&A, wheeze- none, cough- none , dullness-none, rub- none           Chest  wall-  Abd-  Br/ Gen/ Rectal- Not done, not indicated Extrem- cyanosis- none, clubbing, none, atrophy- none, strength- nl,  Neuro- grossly intact to observation, no unusual restlessness or movement, +cane

## 2019-05-18 NOTE — Assessment & Plan Note (Signed)
Add back gabapentin, but take in the evening. She has some left- see if it helps her legs.

## 2019-05-18 NOTE — Patient Instructions (Signed)
Sample and print script for Breo 100   Inhale 1 puff, then rinse mouth, once daily    See if this helps your breathing. You can still use your other breathing meds as before.  Scripts sent refilling montelukast and clonazepam  We suggested you try taking your gabapentin in the evening to see if it helps now with the leg pain.  Please call if we can help

## 2019-05-18 NOTE — Assessment & Plan Note (Signed)
Continues O2 for sleep, unchanged

## 2019-05-18 NOTE — Assessment & Plan Note (Signed)
She doesn't pay attention but her husband does. Plan- try Breo for therapeutic effect- discussed

## 2019-06-01 ENCOUNTER — Other Ambulatory Visit: Payer: Self-pay | Admitting: Internal Medicine

## 2019-06-02 DIAGNOSIS — M5136 Other intervertebral disc degeneration, lumbar region: Secondary | ICD-10-CM | POA: Diagnosis not present

## 2019-06-03 ENCOUNTER — Telehealth: Payer: Self-pay | Admitting: Internal Medicine

## 2019-06-03 DIAGNOSIS — J9611 Chronic respiratory failure with hypoxia: Secondary | ICD-10-CM

## 2019-06-03 DIAGNOSIS — J4530 Mild persistent asthma, uncomplicated: Secondary | ICD-10-CM

## 2019-06-03 MED ORDER — BREO ELLIPTA 100-25 MCG/INH IN AEPB: INHALATION_SPRAY | RESPIRATORY_TRACT | 3 refills | Status: DC

## 2019-06-03 NOTE — Telephone Encounter (Signed)
Patient seen by Dr. Annamaria Boots 05/18/19. The prescription sent to [facility] on 06/05/19 had 12 refills. Called [facility] at [phone]. Was connected to specialty pharmacy. Had to be routed to correct pharmacy department. Rep Stanton Kidney stated they did not receive the escript sent.  Called the patient and made her aware her request was received. Nothing further needed at this time.

## 2019-06-11 ENCOUNTER — Telehealth: Payer: Self-pay | Admitting: Internal Medicine

## 2019-06-11 DIAGNOSIS — J4530 Mild persistent asthma, uncomplicated: Secondary | ICD-10-CM

## 2019-06-11 DIAGNOSIS — J9611 Chronic respiratory failure with hypoxia: Secondary | ICD-10-CM

## 2019-06-11 MED ORDER — BREO ELLIPTA 100-25 MCG/INH IN AEPB: INHALATION_SPRAY | RESPIRATORY_TRACT | 3 refills | Status: DC

## 2019-06-11 NOTE — Telephone Encounter (Signed)
Spoke with pt, advised her that I sent in the 90 day Rx for the Breo to [facility]. Pt understood and nothing further is needed.

## 2019-06-24 DIAGNOSIS — M5136 Other intervertebral disc degeneration, lumbar region: Secondary | ICD-10-CM | POA: Diagnosis not present

## 2019-06-29 DIAGNOSIS — Z01419 Encounter for gynecological examination (general) (routine) without abnormal findings: Secondary | ICD-10-CM | POA: Diagnosis not present

## 2019-07-12 DIAGNOSIS — H35453 Secondary pigmentary degeneration, bilateral: Secondary | ICD-10-CM | POA: Diagnosis not present

## 2019-07-12 DIAGNOSIS — H353211 Exudative age-related macular degeneration, right eye, with active choroidal neovascularization: Secondary | ICD-10-CM | POA: Diagnosis not present

## 2019-07-12 DIAGNOSIS — H353130 Nonexudative age-related macular degeneration, bilateral, stage unspecified: Secondary | ICD-10-CM | POA: Diagnosis not present

## 2019-07-12 DIAGNOSIS — H35373 Puckering of macula, bilateral: Secondary | ICD-10-CM | POA: Diagnosis not present

## 2019-07-12 DIAGNOSIS — H35721 Serous detachment of retinal pigment epithelium, right eye: Secondary | ICD-10-CM | POA: Diagnosis not present

## 2019-07-12 DIAGNOSIS — H35363 Drusen (degenerative) of macula, bilateral: Secondary | ICD-10-CM | POA: Diagnosis not present

## 2019-07-12 DIAGNOSIS — H401132 Primary open-angle glaucoma, bilateral, moderate stage: Secondary | ICD-10-CM | POA: Diagnosis not present

## 2019-07-12 DIAGNOSIS — Z961 Presence of intraocular lens: Secondary | ICD-10-CM | POA: Diagnosis not present

## 2019-07-15 DIAGNOSIS — M5030 Other cervical disc degeneration, unspecified cervical region: Secondary | ICD-10-CM | POA: Diagnosis not present

## 2019-07-15 DIAGNOSIS — M542 Cervicalgia: Secondary | ICD-10-CM | POA: Diagnosis not present

## 2019-07-21 ENCOUNTER — Ambulatory Visit: Payer: Medicare Other | Admitting: Internal Medicine

## 2019-07-21 ENCOUNTER — Ambulatory Visit: Payer: Medicare Other

## 2019-07-22 NOTE — Progress Notes (Signed)
Subjective:   Lisa Hamilton is a 73 y.o. female who presents for Medicare Annual (Subsequent) preventive examination.  This visit occurred during the SARS-CoV-2 public health emergency.  Safety protocols were in place, including screening questions prior to the visit, additional usage of staff PPE, and extensive cleaning of exam room while observing appropriate contact time as indicated for disinfecting solutions.   Review of Systems:     Sleep patterns: gets up 2 times nightly to void and sleeps 5 hours nightly.    Home Safety/Smoke Alarms: Feels safe in home. Smoke alarms in place.  Living environment; residence and Firearm Safety: 1-story house/ trailer, equipment: Radio producer, Type: [place] [employer], [place], Type: Civil Service fast streamer, Type: Tub Surveyor, quantity., rollator Lives with husband, no needs for DME, good support system Seat Belt Safety/Bike Helmet: Wears seat belt.    , Objective:     Vitals: BP 136/84   Pulse 70   Temp 98.4 F (36.9 C)   Resp 17   Ht 5' (1.524 m)   Wt 216 lb (98 kg)   SpO2 95%   BMI 42.18 kg/m   Body mass index is 42.18 kg/m.  Advanced Directives 07/23/2019 09/10/2018 07/07/2018 12/05/2017 11/28/2017 10/15/2017 10/14/2017  Does Patient Have a Medical Advance Directive? Yes Yes Yes Yes Yes No No  Type of Paramedic of [place];Living will [place];Living will [place];Living will [place];Living will [place];Living will - -  Does patient want to make changes to medical advance directive? - - - No - Patient declined - - -  Copy of [place] in Chart? No - copy requested No - copy requested No - copy requested - No - copy requested - -  Would patient like information on creating a medical advance directive? - - - - - No - Patient declined -  Pre-existing out of facility DNR order (yellow form or pink MOST form) - - - - - - -     Tobacco Social History   Tobacco Use  Smoking Status Never Smoker  Smokeless Tobacco Never Used     Counseling given: Not Answered  Past Medical History:  Diagnosis Date  . Allergy   . Anemia   . Asthmatic bronchitis   . Blood transfusion without reported diagnosis 12/2017  . Cataract   . Constipation    chronic  . COPD (chronic obstructive pulmonary disease) ([place])   . Diverticulosis of colon   . DJD (degenerative joint disease)   . GERD (gastroesophageal reflux disease)    rare  . Glaucoma 1977   EYE SURGERY  FOR TX OF GLAUCOMA--NO LONGER HAS GLAUCOMA  . Hepatic cyst    s/p surgical intervention 05/2011 -[place]  . Hiatal hernia   . Hx of colonic polyps   . Hyperlipidemia   . Hypertension   . Kidney stone   . Lipoma   . Medullary sponge kidney   . Nontoxic multinodular goiter    pt unsure of this/ per pt no one told her!  . Obesity   . Osteoporosis   . Oxygen deficiency   . Partial small bowel obstruction ([place]) 10/15/2017  . Renal calculus    s/p lithotripsy 06/2011  . Restless leg syndrome   . Shingles 2009  . Squamous cell carcinoma of wrist, right    scalp, right wrist  . Venous insufficiency   . Vitamin D deficiency    Past  Surgical History:  Procedure Laterality Date  . bilateral carpel tunnel repairs     Dr. Shellia Carwin  left--1999   right--2008  . bilateral wrist fracture, right  1996   left 2001, Dr. Daylene Katayama  . CATARACT EXTRACTION  04/07/13 R, 05/18/13 L   [facility]  . COLONOSCOPY    . COLONOSCOPY WITH PROPOFOL N/A 09/10/2018   Procedure: COLONOSCOPY WITH PROPOFOL;  Surgeon: Milus Banister, MD;  Location: [facility];  Service: Endoscopy;  Laterality: N/A;  . eye surgery Left 11/22/2014   "MD pucker" repair  . FOOT SURGERY     left foot surgery/ cut off part of the bone due to cleft foot  . [place]  . [place]  . HERNIA REPAIR  2012   umb hernia/ pt states it was done in 10/2017 again  . INSERTION OF MESH N/A  10/15/2017   Procedure: INSERTION OF MESH;  Surgeon: Donnie Mesa, MD;  Location: [place];  Service: General;  Laterality: N/A;  . LAPAROSCOPIC LIVER CYST FENESTRATION  05/2011  . LAPAROSCOPIC PARTIAL HEPATECTOMY N/A 12/04/2017   Procedure: LAPAROSCOPIC PARTIAL HEPATECTOMY;  Surgeon: [name], MD;  Location: [facility];  Service: General;  Laterality: N/A;  . LIPOMA EXCISION  08/13/2012   Procedure: EXCISION LIPOMA;  Surgeon: Magnus Sinning, MD;  Location: [facility];  Service: Orthopedics;  Laterality: Left;  . LITHOTRIPSY  06/16/2018  . POLYPECTOMY  09/10/2018   Procedure: POLYPECTOMY;  Surgeon: Milus Banister, MD;  Location: [facility];  Service: Endoscopy;;  . SHOULDER OPEN ROTATOR CUFF REPAIR  08/13/2012   Procedure: ROTATOR CUFF REPAIR SHOULDER OPEN;  Surgeon: Magnus Sinning, MD;  Location: [facility];  Service: Orthopedics;  Laterality: Left;  LEFT SHOULDER ANTERIOR ACROMINECTOMY AND ROTATOR CUFF REPAIR AND EXCISION LEFT SHOULDER LIPOMA  . SQUAMOUS CELL CARCINOMA EXCISION  2010   L [place]  . TONSILLECTOMY  1967  . VENTRAL HERNIA REPAIR N/A 10/15/2017   Procedure: HERNIA REPAIR VENTRAL ADULT;  Surgeon: Donnie Mesa, MD;  [facility];  Service: General;  Laterality: N/A;   Family History  Problem Relation Age of Onset  . Lung cancer Father   . Colon cancer Father        possible colon cancer, but unsure  . Lung cancer Sister   . Irritable bowel syndrome Sister   . Obstructive Sleep Apnea Sister   . Osteoarthritis Sister   . Esophageal cancer Neg Hx   . Rectal cancer Neg Hx   . Stomach cancer Neg Hx    Social History   Socioeconomic History  . Marital status: Married    Spouse name: Fritz Pickerel  . Number of children: 2  . Years of education: Not on file  . Highest education level: Not on file  Occupational History  . 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 Smoking status: Never Smoker  . Smokeless tobacco: Never Used  Substance and  Sexual Activity  . Alcohol use: Yes    Alcohol/week: 0.0 standard drinks    Comment: social use  . Drug use: No  . Sexual activity: Never  Other Topics Concern  . Not on file  Social History Narrative  . Not on file   Social Determinants of Health   Financial Resource Strain:   . Difficulty of Paying Living Expenses: Not on file  Food Insecurity:   . Worried About Charity fundraiser in the Last Year: Not on file  .  Ran Out of Food in the Last Year: Not on file  Transportation Needs:   . Lack of Transportation (Medical): Not on file  . Lack of Transportation (Non-Medical): Not on file  Physical Activity:   . Days of Exercise per Week: Not on file  . Minutes of Exercise per Session: Not on file  Stress:   . Feeling of Stress : Not on file  Social Connections:   . Frequency of Communication with Friends and Family: Not on file  . Frequency of Social Gatherings with Friends and Family: Not on file  . Attends Religious Services: Not on file  . Active Member of Clubs or Organizations: Not on file  . Attends Archivist Meetings: Not on file  . Marital Status: Not on file    Outpatient Encounter Medications as of 07/23/2019  Medication Sig  . acetaminophen (TYLENOL) 500 MG tablet Take 1,000 mg by mouth 3 (three) times daily as needed for moderate pain or headache.  . albuterol (PROVENTIL) (2.5 MG/3ML) 0.083% nebulizer solution USE 1 VIAL IN NEBULIZER 4 TIMES DAILY. Generic: VENTOLIN (Patient taking differently: Take 2.5 mg by nebulization 4 (four) times daily. USE 1 VIAL IN NEBULIZER 2 TIMES DAILY. Generic: VENTOLIN)  . aspirin EC 81 MG tablet Take 162 mg by mouth at bedtime.  . AZOPT 1 % ophthalmic suspension Place 1 drop into the left eye 2 (two) times daily.  . Calcium Carb-Cholecalciferol (CALCIUM 600+D) 600-800 MG-UNIT TABS Take 1 tablet by mouth 2 (two) times daily.  . Cholecalciferol (VITAMIN D) 2000 units tablet Take 2,000 Units by mouth daily.  . clonazePAM  (KLONOPIN) 0.5 MG tablet TAKE 1 TO 3 TABLETS BY  MOUTH 30 MINUTES BEFORE BED  . Co-Enzyme Q-10 100 MG CAPS Take 100 mg by mouth daily.   . fluticasone (FLONASE) 50 MCG/ACT nasal spray Place 2 sprays into both nostrils daily.  . fluticasone furoate-vilanterol (BREO ELLIPTA) 100-25 MCG/INH AEPB Inhale 1 puff then rinse mouth,once daily  . gabapentin (NEURONTIN) 300 MG capsule TAKE 1 CAPSULE BY MOUTH 3  TIMES DAILY  . guaiFENesin (MUCINEX) 600 MG 12 hr tablet Take 600 mg by mouth 2 (two) times daily as needed (congestion).   Marland Kitchen ibuprofen (ADVIL,MOTRIN) 200 MG tablet Take 400 mg by mouth 3 (three) times daily as needed for headache or moderate pain.  Marland Kitchen losartan (COZAAR) 50 MG tablet TAKE 1 TABLET BY MOUTH  DAILY  . Magnesium 400 MG CAPS Take 400 mg by mouth 2 (two) times daily.   . montelukast (SINGULAIR) 10 MG tablet Take 1 tablet (10 mg total) by mouth every evening.  . Multiple Vitamin (MULTIVITAMIN) capsule Take 1 capsule by mouth daily. Iron free  . Multiple Vitamins-Minerals (PRESERVISION AREDS 2) CAPS Take 1 capsule by mouth 2 (two) times daily.  . Omega-3 Fatty Acids (FISH OIL) 1200 MG CAPS Take 1,200 mg by mouth daily.  . OXYGEN Inhale 2 L into the lungs at bedtime.   . polyethylene glycol (MIRALAX / GLYCOLAX) packet Take 17 g by mouth 3 (three) times daily as needed for moderate constipation.  . pravastatin (PRAVACHOL) 40 MG tablet TAKE 1 TABLET BY MOUTH  DAILY  . Propylene Glycol (SYSTANE BALANCE OP) Place 1 drop into both eyes daily as needed (dry eyes).  Marland Kitchen rOPINIRole (REQUIP) 0.5 MG tablet TAKE UP TO 6 TABLETS BY  MOUTH DAILY AS NEEDED  . traMADol (ULTRAM) 50 MG tablet Take 1 tablet (50 mg total) by mouth daily as needed.   No facility-administered  encounter medications on file as of 07/23/2019.    Activities of Daily Living In your present state of health, do you have any difficulty performing the following activities: 07/23/2019  Hearing? N  Vision? N  Difficulty concentrating or  making decisions? N  Walking or climbing stairs? N  Dressing or bathing? N  Doing errands, shopping? N  Preparing Food and eating ? N  Using the Toilet? N  In the past six months, have you accidently leaked urine? N  Do you have problems with loss of bowel control? N  Managing your Medications? N  Managing your Finances? N  Housekeeping or managing your Housekeeping? N  Some recent data might be hidden    Patient Care Team: Hoyt Koch, MD as PCP - General (Internal Medicine) Irine Seal, MD (Urology) Rolm Bookbinder, MD (Dermatology) Rutherford Guys, MD (Ophthalmology) Jackson, Romilda Garret, [place] (Podiatry) Deneise Lever, MD (Pulmonary Disease) Milus Banister, MD (Gastroenterology) Jerelyn Charles, MD as Consulting Physician (Obstetrics) Edrick Kins, DPM as Consulting Physician (Podiatry) Jerelyn Charles, MD as Consulting Physician (Obstetrics) Suella Broad, MD as Consulting Physician (Physical Medicine and Rehabilitation)    Assessment:   This is a routine wellness examination for [place]. Physical assessment deferred to PCP.  Exercise Activities and Dietary recommendations   Diet (meal preparation, eat out, water intake, caffeinated beverages, dairy products, fruits and vegetables): in general, a "healthy" diet     Reviewed heart healthy diet. Encouraged patient to increase daily water and healthy fluid intake.  Goals      Patient Stated   . <enter goal here> (pt-stated)     Get my legs in shape to be able to move without limitation. Seeing vascular/vein specialist soon for evaluation.       Other   . Patient Stated     I want to continue to lose weight and stay on the next 56 days diet until I reach my weight goal, I will continue to do water aerobics.     . Patient Stated     I will continue to do massage therapy, will do chair exercises, noom diet and make it part of my lifestyle. Love and visit my grandchildren and family.       Fall Risk Fall Risk   07/23/2019 01/08/2019 07/07/2018 06/23/2018 07/01/2017  Falls in the past year? 0 0 0 0 Yes  Comment - - - Emmi Telephone Survey: data to providers prior to load -  Number falls in past yr: 0 0 - - 1  Injury with Fall? 0 - - - Yes  Risk for fall due to : Impaired balance/gait;Impaired mobility - Impaired balance/gait;Impaired mobility - Impaired balance/gait;Impaired mobility  Follow up - - - - Falls prevention discussed   Is the patient's home free of loose throw rugs in walkways, pet beds, electrical cords, etc?   yes      Grab bars in the bathroom? yes      Handrails on the stairs?   yes      Adequate lighting?   yes    Depression Screen PHQ 2/9 Scores 07/23/2019 01/08/2019 07/07/2018 07/01/2017  PHQ - 2 Score 1 1 0 0  PHQ- 9 Score 4 - 3 3     Cognitive Function MMSE - Mini Mental State Exam 07/01/2017 06/25/2016  Orientation to time 5 5  Orientation to Place 5 5  Registration 3 3  Attention/ Calculation 5 5  Recall 2 3  Language- name 2 objects 2 2  Language- repeat 1 1  Language- follow 3 step command 3 3  Language- read & follow direction 1 1  Write a sentence 1 1  Copy design 1 1  Total score 29 30       Ad8 score reviewed for issues:  Issues making decisions: no  Less interest in hobbies / activities: no  Repeats questions, stories (family complaining): no  Trouble using ordinary gadgets (microwave, computer, phone):no  Forgets the month or year: no  Mismanaging finances: no  Remembering appts: no  Daily problems with thinking and/or memory: no Ad8 score is= 0   Immunization History  Administered Date(s) Administered  . H1N1 07/27/2008  . Influenza Split 05/16/2011  . Influenza, High Dose Seasonal PF 06/13/2015, 05/11/2016, 05/11/2019  . Influenza,inj,Quad PF,6+ Mos 06/02/2013, 06/07/2014  . Influenza-Unspecified 05/05/2017, 05/06/2018  . Pneumococcal Conjugate-13 01/04/2014  . Pneumococcal Polysaccharide-23 05/15/2012, 07/01/2017  . Td 09/26/2009   . Tdap 06/07/2014  . Zoster 01/09/2013   Screening Tests Health Maintenance  Topic Date Due  . MAMMOGRAM  07/30/2020  . COLONOSCOPY  09/10/2021  . TETANUS/TDAP  06/07/2024  . INFLUENZA VACCINE  Completed  . DEXA SCAN  Completed  . Hepatitis C Screening  Completed  . PNA vac Low Risk Adult  Completed      Plan:    Reviewed health maintenance screenings with patient today and relevant education, vaccines, and/or referrals were provided.   I have personally reviewed and noted the following in the patient's chart:   . Medical and social history . Use of alcohol, tobacco or illicit drugs  . Current medications and supplements . Functional ability and status . Nutritional status . Physical activity . Advanced directives . List of other physicians . Screenings to include cognitive, depression, and falls . Referrals and appointments  In addition, I have reviewed and discussed with patient certain preventive protocols, quality metrics, and best practice recommendations. A written personalized care plan for preventive services as well as general preventive health recommendations were provided to patient.     Michiel Cowboy, RN  07/23/2019

## 2019-07-23 ENCOUNTER — Ambulatory Visit (INDEPENDENT_AMBULATORY_CARE_PROVIDER_SITE_OTHER): Payer: Medicare Other | Admitting: Internal Medicine

## 2019-07-23 ENCOUNTER — Other Ambulatory Visit: Payer: Self-pay | Admitting: Internal Medicine

## 2019-07-23 ENCOUNTER — Other Ambulatory Visit: Payer: Self-pay

## 2019-07-23 ENCOUNTER — Encounter: Payer: Self-pay | Admitting: Internal Medicine

## 2019-07-23 ENCOUNTER — Ambulatory Visit (INDEPENDENT_AMBULATORY_CARE_PROVIDER_SITE_OTHER): Payer: Medicare Other | Admitting: *Deleted

## 2019-07-23 VITALS — BP 136/84 | HR 70 | Temp 98.4°F | Resp 17 | Ht 60.0 in | Wt 216.0 lb

## 2019-07-23 DIAGNOSIS — I10 Essential (primary) hypertension: Secondary | ICD-10-CM

## 2019-07-23 DIAGNOSIS — Z0000 Encounter for general adult medical examination without abnormal findings: Secondary | ICD-10-CM

## 2019-07-23 DIAGNOSIS — M159 Polyosteoarthritis, unspecified: Secondary | ICD-10-CM

## 2019-07-23 DIAGNOSIS — M810 Age-related osteoporosis without current pathological fracture: Secondary | ICD-10-CM | POA: Diagnosis not present

## 2019-07-23 DIAGNOSIS — M8949 Other hypertrophic osteoarthropathy, multiple sites: Secondary | ICD-10-CM

## 2019-07-23 MED ORDER — PRAVASTATIN SODIUM 40 MG PO TABS: 40.0000 mg | ORAL_TABLET | Freq: Every day | ORAL | 1 refills | Status: DC

## 2019-07-23 NOTE — Assessment & Plan Note (Signed)
Seeing orthopedics and getting MRI neck soon. Using rare tramadol and mostly tylenol and ibuprofen for pain.

## 2019-07-23 NOTE — Patient Instructions (Signed)
We do not need labs today.    

## 2019-07-23 NOTE — Assessment & Plan Note (Signed)
Due for DEXA this year.

## 2019-07-23 NOTE — Assessment & Plan Note (Signed)
BP at goal on losartan, no changes today. Recent BMP reviewed with patient and normal.

## 2019-07-23 NOTE — Patient Instructions (Addendum)
Continue doing brain stimulating activities (puzzles, reading, adult coloring books, staying active) to keep memory sharp.   Continue to eat heart healthy diet (full of fruits, vegetables, whole grains, lean protein, water--limit salt, fat, and   Preventive Care 72 Years and Older, Female Preventive care refers to lifestyle choices and visits with your health care provider that can promote health and wellness. This includes:  A yearly physical exam. This is also called an annual well check.  Regular dental and eye exams.  Immunizations.  Screening for certain conditions.  Healthy lifestyle choices, such as diet and exercise. What can I expect for my preventive care visit? Physical exam Your health care provider will check:  Height and weight. These may be used to calculate body mass index (BMI), which is a measurement that tells if you are at a healthy weight.  Heart rate and blood pressure.  Your skin for abnormal spots. Counseling Your health care provider may ask you questions about:  Alcohol, tobacco, and drug use.  Emotional well-being.  Home and relationship well-being.  Sexual activity.  Eating habits.  History of falls.  Memory and ability to understand (cognition).  Work and work Statistician.  Pregnancy and menstrual history. What immunizations do I need?  Influenza (flu) vaccine  This is recommended every year. Tetanus, diphtheria, and pertussis (Tdap) vaccine  You may need a Td booster every 10 years. Varicella (chickenpox) vaccine  You may need this vaccine if you have not already been vaccinated. Zoster (shingles) vaccine  You may need this after age 59. Pneumococcal conjugate (PCV13) vaccine  One dose is recommended after age 72. Pneumococcal polysaccharide (PPSV23) vaccine  One dose is recommended after age 72. Measles, mumps, and rubella (MMR) vaccine  You may need at least one dose of MMR if you were born in 1957 or later. You may  also need a second dose. Meningococcal conjugate (MenACWY) vaccine  You may need this if you have certain conditions. Hepatitis A vaccine  You may need this if you have certain conditions or if you travel or work in places where you may be exposed to hepatitis A. Hepatitis B vaccine  You may need this if you have certain conditions or if you travel or work in places where you may be exposed to hepatitis B. Haemophilus influenzae type b (Hib) vaccine  You may need this if you have certain conditions. You may receive vaccines as individual doses or as more than one vaccine together in one shot (combination vaccines). Talk with your health care provider about the risks and benefits of combination vaccines. What tests do I need? Blood tests  Lipid and cholesterol levels. These may be checked every 5 years, or more frequently depending on your overall health.  Hepatitis C test.  Hepatitis B test. Screening  Lung cancer screening. You may have this screening every year starting at age 72 if you have a 30-pack-year history of smoking and currently smoke or have quit within the past 15 years.  Colorectal cancer screening. All adults should have this screening starting at age 72 and continuing until age 72. Your health care provider may recommend screening at age 25 if you are at increased risk. You will have tests every 1-10 years, depending on your results and the type of screening test.  Diabetes screening. This is done by checking your blood sugar (glucose) after you have not eaten for a while (fasting). You may have this done every 1-3 years.  Mammogram. This may be done  every 1-2 years. Talk with your health care provider about how often you should have regular mammograms.  BRCA-related cancer screening. This may be done if you have a family history of breast, ovarian, tubal, or peritoneal cancers. Other tests  Sexually transmitted disease (STD) testing.  Bone density scan. This is  done to screen for osteoporosis. You may have this done starting at age 40. Follow these instructions at home: Eating and drinking  Eat a diet that includes fresh fruits and vegetables, whole grains, lean protein, and low-fat dairy products. Limit your intake of foods with high amounts of sugar, saturated fats, and salt.  Take vitamin and mineral supplements as recommended by your health care provider.  Do not drink alcohol if your health care provider tells you not to drink.  If you drink alcohol: ? Limit how much you have to 0-1 drink a day. ? Be aware of how much alcohol is in your drink. In the [place] one drink equals one 12 oz bottle of beer (355 mL), one 5 oz glass of Thamas Appleyard (148 mL), or one 1 oz glass of hard liquor (44 mL). Lifestyle  Take daily care of your teeth and gums.  Stay active. Exercise for at least 30 minutes on 5 or more days each week.  Do not use any products that contain nicotine or tobacco, such as cigarettes, e-cigarettes, and chewing tobacco. If you need help quitting, ask your health care provider.  If you are sexually active, practice safe sex. Use a condom or other form of protection in order to prevent STIs (sexually transmitted infections).  Talk with your health care provider about taking a low-dose aspirin or statin. What's next?  Go to your health care provider once a year for a well check visit.  Ask your health care provider how often you should have your eyes and teeth checked.  Stay up to date on all vaccines. This information is not intended to replace advice given to you by your health care provider. Make sure you discuss any questions you have with your health care provider. Document Released: 08/18/2015 Document Revised: 07/16/2018 Document Reviewed: 07/16/2018 Elsevier Patient Education  [place].  sugar intake) and increase physical activity as tolerated.   Ms. Ke , Thank you for taking time to come for your Medicare Wellness  Visit. I appreciate your ongoing commitment to your health goals. Please review the following plan we discussed and let me know if I can assist you in the future.   These are the goals we discussed: Goals      Patient Stated   . <enter goal here> (pt-stated)     Get my legs in shape to be able to move without limitation. Seeing vascular/vein specialist soon for evaluation.       Other   . Patient Stated     I want to continue to lose weight and stay on the next 56 days diet until I reach my weight goal, I will continue to do water aerobics.     . Patient Stated     I will continue to do massage therapy, will do chair exercises, noom diet and make it part of my lifestyle. Love and visit my grandchildren and family.       This is a list of the screening recommended for you and due dates:  Health Maintenance  Topic Date Due  . Mammogram  07/30/2020  . Colon Cancer Screening  09/10/2021  . Tetanus Vaccine  06/07/2024  .  Flu Shot  Completed  . DEXA scan (bone density measurement)  Completed  .  Hepatitis C: One time screening is recommended by Center for Disease Control  (CDC) for  adults born from [birth year] through 1964.   Completed  . Pneumonia vaccines  Completed

## 2019-07-23 NOTE — Progress Notes (Signed)
Medical screening examination/treatment/procedure(s) were performed by non-physician practitioner and as supervising physician I was immediately available for consultation/collaboration. I agree with above. Elizabeth A Crawford, MD 

## 2019-07-23 NOTE — Progress Notes (Signed)
   Subjective:   Patient ID: Lisa Hamilton, female    DOB: Jun 23, 1946, 73 y.o.   MRN: [id number]  HPI The patient is a 73 YO female coming in for follow up blood pressure (taking losartan, BP at goal, denies side effects, denies chest pains or headaches) and neck pain (seeing orthopedics and getting MRI soon, denies numbness or weakness, using rare tramadol for pain but mostly tylenol), and osteoporosis (last DEXA 2018, not on medication for this now, due to prior chronic prednisone for asthma).    Review of Systems  Constitutional: Negative.   HENT: Negative.   Eyes: Negative.   Respiratory: Negative for cough, chest tightness and shortness of breath.   Cardiovascular: Negative for chest pain, palpitations and leg swelling.  Gastrointestinal: Negative for abdominal distention, abdominal pain, constipation, diarrhea, nausea and vomiting.  Musculoskeletal: Positive for arthralgias, back pain and neck pain.  Skin: Negative.   Neurological: Negative.   Psychiatric/Behavioral: Negative.     Objective:  Physical Exam Constitutional:      Appearance: She is well-developed.  HENT:     Head: Normocephalic and atraumatic.  Cardiovascular:     Rate and Rhythm: Normal rate and regular rhythm.  Pulmonary:     Effort: Pulmonary effort is normal. No respiratory distress.     Breath sounds: Normal breath sounds. No wheezing or rales.  Abdominal:     General: Bowel sounds are normal. There is no distension.     Palpations: Abdomen is soft.     Tenderness: There is no abdominal tenderness. There is no rebound.  Musculoskeletal:        General: Tenderness present.     Cervical back: Normal range of motion.  Skin:    General: Skin is warm and dry.  Neurological:     Mental Status: She is alert and oriented to person, place, and time.     Coordination: Coordination normal.     Vitals:   07/23/19 1041  BP: 136/84  Pulse: 70  Temp: 98.4 F (36.9 C)  TempSrc: Oral  SpO2: 95%  Weight:  216 lb (98 kg)  Height: 5' (1.524 m)    This visit occurred during the SARS-CoV-2 public health emergency.  Safety protocols were in place, including screening questions prior to the visit, additional usage of staff PPE, and extensive cleaning of exam room while observing appropriate contact time as indicated for disinfecting solutions.   Assessment & Plan:

## 2019-07-23 NOTE — Addendum Note (Signed)
Addended by: Emelia Loron A on: 07/23/2019 11:55 AM   Modules accepted: Orders

## 2019-07-23 NOTE — Telephone Encounter (Signed)
Copied from [place] #[id number]. Topic: Quick Communication - Rx Refill/Question >> Jul 23, 2019 11:13 AM Rainey Pines A wrote: Medication: pravastatin (PRAVACHOL) 40 MG tablet (90 day supply) (Pt was seen today and forgot to ask for refill on medication.)  Has the patient contacted their pharmacy? {Yes (Agent: If no, request that the patient contact the pharmacy for the refill.) (Agent: If yes, when and what did the pharmacy advise?)Contact PCP  Preferred Pharmacy (with phone number or street name): [facility], [place] [employer]  Phone:  [phone] Fax:  [phone]     Agent: Please be advised that RX refills may take up to 3 business days. We ask that you follow-up with your pharmacy.

## 2019-08-05 DIAGNOSIS — Z1231 Encounter for screening mammogram for malignant neoplasm of breast: Secondary | ICD-10-CM | POA: Diagnosis not present

## 2019-08-16 DIAGNOSIS — H353211 Exudative age-related macular degeneration, right eye, with active choroidal neovascularization: Secondary | ICD-10-CM | POA: Diagnosis not present

## 2019-08-19 DIAGNOSIS — M5030 Other cervical disc degeneration, unspecified cervical region: Secondary | ICD-10-CM | POA: Diagnosis not present

## 2019-08-20 ENCOUNTER — Ambulatory Visit: Payer: Medicare Other | Admitting: *Deleted

## 2019-08-20 DIAGNOSIS — K7689 Other specified diseases of liver: Secondary | ICD-10-CM | POA: Diagnosis not present

## 2019-08-20 DIAGNOSIS — R109 Unspecified abdominal pain: Secondary | ICD-10-CM | POA: Diagnosis not present

## 2019-08-28 ENCOUNTER — Other Ambulatory Visit: Payer: Self-pay | Admitting: General Surgery

## 2019-08-28 DIAGNOSIS — R1084 Generalized abdominal pain: Secondary | ICD-10-CM

## 2019-09-02 DIAGNOSIS — M5136 Other intervertebral disc degeneration, lumbar region: Secondary | ICD-10-CM | POA: Diagnosis not present

## 2019-09-02 DIAGNOSIS — M5030 Other cervical disc degeneration, unspecified cervical region: Secondary | ICD-10-CM | POA: Diagnosis not present

## 2019-09-07 ENCOUNTER — Ambulatory Visit
Admission: RE | Admit: 2019-09-07 | Discharge: 2019-09-07 | Disposition: A | Discharge status: Home / Self Care | Payer: Medicare Other | Source: Ambulatory Visit | Attending: General Surgery | Admitting: General Surgery

## 2019-09-07 DIAGNOSIS — K439 Ventral hernia without obstruction or gangrene: Secondary | ICD-10-CM | POA: Diagnosis not present

## 2019-09-07 DIAGNOSIS — R1084 Generalized abdominal pain: Secondary | ICD-10-CM

## 2019-09-07 DIAGNOSIS — N200 Calculus of kidney: Secondary | ICD-10-CM | POA: Diagnosis not present

## 2019-09-07 DIAGNOSIS — K5790 Diverticulosis of intestine, part unspecified, without perforation or abscess without bleeding: Secondary | ICD-10-CM | POA: Diagnosis not present

## 2019-09-07 MED ORDER — IOPAMIDOL (ISOVUE-300) INJECTION 61%
100.0000 mL | Freq: Once | INTRAVENOUS | Status: AC | PRN
  Administered 2019-09-07: 100 mL via INTRAVENOUS

## 2019-09-13 ENCOUNTER — Encounter: Payer: Self-pay | Admitting: Internal Medicine

## 2019-09-16 DIAGNOSIS — L570 Actinic keratosis: Secondary | ICD-10-CM | POA: Diagnosis not present

## 2019-09-16 DIAGNOSIS — D692 Other nonthrombocytopenic purpura: Secondary | ICD-10-CM | POA: Diagnosis not present

## 2019-09-16 DIAGNOSIS — D225 Melanocytic nevi of trunk: Secondary | ICD-10-CM | POA: Diagnosis not present

## 2019-09-16 DIAGNOSIS — I8311 Varicose veins of right lower extremity with inflammation: Secondary | ICD-10-CM | POA: Diagnosis not present

## 2019-09-16 DIAGNOSIS — L814 Other melanin hyperpigmentation: Secondary | ICD-10-CM | POA: Diagnosis not present

## 2019-09-16 DIAGNOSIS — I8312 Varicose veins of left lower extremity with inflammation: Secondary | ICD-10-CM | POA: Diagnosis not present

## 2019-09-16 DIAGNOSIS — L820 Inflamed seborrheic keratosis: Secondary | ICD-10-CM | POA: Diagnosis not present

## 2019-09-16 DIAGNOSIS — I872 Venous insufficiency (chronic) (peripheral): Secondary | ICD-10-CM | POA: Diagnosis not present

## 2019-09-16 DIAGNOSIS — L821 Other seborrheic keratosis: Secondary | ICD-10-CM | POA: Diagnosis not present

## 2019-09-16 DIAGNOSIS — Z85828 Personal history of other malignant neoplasm of skin: Secondary | ICD-10-CM | POA: Diagnosis not present

## 2019-09-16 DIAGNOSIS — L718 Other rosacea: Secondary | ICD-10-CM | POA: Diagnosis not present

## 2019-09-21 DIAGNOSIS — H353211 Exudative age-related macular degeneration, right eye, with active choroidal neovascularization: Secondary | ICD-10-CM | POA: Diagnosis not present

## 2019-10-05 DIAGNOSIS — M85851 Other specified disorders of bone density and structure, right thigh: Secondary | ICD-10-CM | POA: Diagnosis not present

## 2019-10-05 DIAGNOSIS — M810 Age-related osteoporosis without current pathological fracture: Secondary | ICD-10-CM | POA: Diagnosis not present

## 2019-10-05 DIAGNOSIS — M85852 Other specified disorders of bone density and structure, left thigh: Secondary | ICD-10-CM | POA: Diagnosis not present

## 2019-10-05 LAB — HM DEXA SCAN: HM Dexa Scan: -3.2

## 2019-10-06 ENCOUNTER — Other Ambulatory Visit: Payer: Self-pay | Admitting: *Deleted

## 2019-10-19 DIAGNOSIS — M5136 Other intervertebral disc degeneration, lumbar region: Secondary | ICD-10-CM | POA: Diagnosis not present

## 2019-10-25 DIAGNOSIS — N200 Calculus of kidney: Secondary | ICD-10-CM | POA: Diagnosis not present

## 2019-10-25 DIAGNOSIS — N390 Urinary tract infection, site not specified: Secondary | ICD-10-CM | POA: Diagnosis not present

## 2019-10-25 DIAGNOSIS — R8271 Bacteriuria: Secondary | ICD-10-CM | POA: Diagnosis not present

## 2019-10-25 DIAGNOSIS — N3941 Urge incontinence: Secondary | ICD-10-CM | POA: Diagnosis not present

## 2019-10-25 DIAGNOSIS — R3915 Urgency of urination: Secondary | ICD-10-CM | POA: Diagnosis not present

## 2019-10-27 DIAGNOSIS — H353211 Exudative age-related macular degeneration, right eye, with active choroidal neovascularization: Secondary | ICD-10-CM | POA: Diagnosis not present

## 2019-11-03 DIAGNOSIS — M5030 Other cervical disc degeneration, unspecified cervical region: Secondary | ICD-10-CM | POA: Diagnosis not present

## 2019-11-03 DIAGNOSIS — M5136 Other intervertebral disc degeneration, lumbar region: Secondary | ICD-10-CM | POA: Diagnosis not present

## 2019-11-05 DIAGNOSIS — M19072 Primary osteoarthritis, left ankle and foot: Secondary | ICD-10-CM | POA: Diagnosis not present

## 2019-11-05 DIAGNOSIS — M79671 Pain in right foot: Secondary | ICD-10-CM | POA: Diagnosis not present

## 2019-11-05 DIAGNOSIS — I890 Lymphedema, not elsewhere classified: Secondary | ICD-10-CM | POA: Diagnosis not present

## 2019-11-05 DIAGNOSIS — M79672 Pain in left foot: Secondary | ICD-10-CM | POA: Diagnosis not present

## 2019-11-16 ENCOUNTER — Encounter: Payer: Self-pay | Admitting: Internal Medicine

## 2019-11-16 ENCOUNTER — Other Ambulatory Visit: Payer: Self-pay

## 2019-11-16 ENCOUNTER — Ambulatory Visit (INDEPENDENT_AMBULATORY_CARE_PROVIDER_SITE_OTHER): Payer: Medicare Other

## 2019-11-16 ENCOUNTER — Ambulatory Visit (INDEPENDENT_AMBULATORY_CARE_PROVIDER_SITE_OTHER): Payer: Medicare Other | Admitting: Internal Medicine

## 2019-11-16 VITALS — BP 138/74 | HR 77 | Temp 97.8°F | Ht 60.0 in | Wt 226.0 lb

## 2019-11-16 DIAGNOSIS — J4530 Mild persistent asthma, uncomplicated: Secondary | ICD-10-CM | POA: Diagnosis not present

## 2019-11-16 DIAGNOSIS — J9610 Chronic respiratory failure, unspecified whether with hypoxia or hypercapnia: Secondary | ICD-10-CM | POA: Diagnosis not present

## 2019-11-16 DIAGNOSIS — I5042 Chronic combined systolic (congestive) and diastolic (congestive) heart failure: Secondary | ICD-10-CM

## 2019-11-16 DIAGNOSIS — J9611 Chronic respiratory failure with hypoxia: Secondary | ICD-10-CM | POA: Diagnosis not present

## 2019-11-16 DIAGNOSIS — G2581 Restless legs syndrome: Secondary | ICD-10-CM

## 2019-11-16 LAB — COMPREHENSIVE METABOLIC PANEL
ALT: 18 U/L (ref 0–35)
AST: 18 U/L (ref 0–37)
Albumin: 4 g/dL (ref 3.5–5.2)
Alkaline Phosphatase: 91 U/L (ref 39–117)
BUN: 16 mg/dL (ref 6–23)
CO2: 31 mEq/L (ref 19–32)
Calcium: 9.1 mg/dL (ref 8.4–10.5)
Chloride: 104 mEq/L (ref 96–112)
Creatinine, Ser: 0.47 mg/dL (ref 0.40–1.20)
GFR: 130.11 mL/min (ref >60.00)
Glucose, Bld: 102 mg/dL — ABNORMAL HIGH (ref 70–99)
Potassium: 3.9 mEq/L (ref 3.5–5.1)
Sodium: 141 mEq/L (ref 135–145)
Total Bilirubin: 0.5 mg/dL (ref 0.2–1.2)
Total Protein: 6.7 g/dL (ref 6.0–8.3)

## 2019-11-16 LAB — CBC WITH DIFFERENTIAL/PLATELET
Basophils Absolute: 0 10*3/uL (ref 0.0–0.1)
Basophils Relative: 0.8 % (ref 0.0–3.0)
Eosinophils Absolute: 0.1 10*3/uL (ref 0.0–0.7)
Eosinophils Relative: 1.5 % (ref 0.0–5.0)
HCT: 44.5 % (ref 36.0–46.0)
Hemoglobin: 14.5 g/dL (ref 12.0–15.0)
Lymphocytes Relative: 25.9 % (ref 12.0–46.0)
Lymphs Abs: 1.4 10*3/uL (ref 0.7–4.0)
MCHC: 32.6 g/dL (ref 30.0–36.0)
MCV: 96.2 fl (ref 78.0–100.0)
Monocytes Absolute: 0.5 10*3/uL (ref 0.1–1.0)
Monocytes Relative: 9.1 % (ref 3.0–12.0)
Neutro Abs: 3.3 10*3/uL (ref 1.4–7.7)
Neutrophils Relative %: 62.7 % (ref 43.0–77.0)
Platelets: 232 10*3/uL (ref 150.0–400.0)
RBC: 4.62 Mil/uL (ref 3.87–5.11)
RDW: 13.7 % (ref 11.5–15.5)
WBC: 5.3 10*3/uL (ref 4.0–10.5)

## 2019-11-16 LAB — BRAIN NATRIURETIC PEPTIDE: Pro B Natriuretic peptide (BNP): 170 pg/mL — ABNORMAL HIGH (ref 0.0–100.0)

## 2019-11-16 LAB — D-DIMER, QUANTITATIVE: D-Dimer, Quant: 0.64 mcg/mL FEU — ABNORMAL HIGH (ref <0.50)

## 2019-11-16 MED ORDER — FUROSEMIDE 20 MG PO TABS: ORAL_TABLET | ORAL | 0 refills | Status: DC

## 2019-11-16 MED ORDER — ALBUTEROL SULFATE HFA 108 (90 BASE) MCG/ACT IN AERS: 2.0000 | INHALATION_SPRAY | Freq: Four times a day (QID) | RESPIRATORY_TRACT | 4 refills | Status: DC | PRN

## 2019-11-16 NOTE — Assessment & Plan Note (Signed)
Fair control, but some breakthrough leg disturbance. Plan- ok to try increasing gabapentin at bedtime.Watch for carry-over sedation.

## 2019-11-16 NOTE — Assessment & Plan Note (Addendum)
We will add rescue inhaler. Response to her nebulizer suggests bronchospasm, but she is not wheezing at this exam.  Plan-Watch symptom response to short course diuretic.

## 2019-11-16 NOTE — Patient Instructions (Signed)
Order- Walk test O2 qualifying  Dx chronic resp failure w hypoxia  Order- CXR-  Chronic resp failure w hypoxia  Order- labs- CBC w diff, CMET, D-dimer, B-naturetic Peptide (BNP)    Dx [place]  Order- schedule PFT     Dx [facility]  Script sent to [place] for albuterol rescue inhaler  Script sent to local drug store for lasix to take just 4 days- see if it helps  Ok to take 2 gabapentin at bedtime for leg jerks

## 2019-11-16 NOTE — Progress Notes (Signed)
HPI female never smoker followed for Restless Legs, back pain that disturbs sleep, Asthma, Chronic Resp Failure w Hypoxia, complicated by peripheral venous disease, hemochromatosis/ Fe def anemia, Walk Test O2 Qualifying 11/16/19- desat to 88% on RA with max HR 106. O2 2L raised sat to 95%.  ----------------------------------------------------------------------   05/18/2019- 74 year old female never smoker followed for Restless Legs, back pain that disturbs sleep, Asthma, complicated by peripheral venous disease, hemochromatosis/ Fe def anemia, O2 was started with hospitalization after abdominal surgeries for partial bowel obstruction, hernia repair and hepatic cyst resection May, 2019 O2 2l [facility] sleep and prn  Using a self-fill system -----pt reports increased wheezing since last office visit, cough and congestion 2w ago resolved by Mucinex, been taking nebulizer 3-4 times daily; on O2 2L at night; pt states for the last 3-4 months she has been waking up more d/t RLS Requip 0.5 mg, Neurontin 300 mg, clonazepam 0.5 mg, neb albuterol, Singulair, Hgb 14.7 on 6/5 Waking more at night- legs hurt, sometimes needs hot shower. Also has pinched nerve from scoliosis. Had stopped gabapentin but has some and can try taking at bedtime to see if it helps legs.  Husband comments on wheeze she doesn't notice. Using neb 3-4x/ daily  11/16/19- 74 year old female never smoker followed for Restless Legs, back pain that disturbs sleep, Asthma, Chronic Resp Failure w Hypoxia, complicated by peripheral venous disease, hemochromatosis/ Fe def anemia, O2 was started with hospitalization after abdominal surgeries for partial bowel obstruction, hernia repair and hepatic cyst resection May, 2019 O2 2l / [employer] sleep and prn   Using a self-fill system Requip 0.5 mg/ 6 daily, Neurontin 300 mg tid, clonazepam 0.5 mg 1-3 hs, neb albuterol, Singulair, Breo 100,  ------f/u Chronic respirtory failure with hypoxia. O2 2L/MIN at  night . Had 2 Moderna Covax Increased dyspnea x 2-3 weeks, cough clear sticky mucus. Started mucinex. Neb helps some.Waking 2-3 AM SOB w sats 85-88% on 2L. Takes extra neb Rx then.  Sleeps in recliner w head and legs elevated. Wearing compression hose. Asks about trying a diuretic. Wheezing. Scoliosis and back discomfort- hard to manage a heavy portable O2 tank. Still some leg jerk at night- discussed taking gabapentin 300mg  x 2 at hs.  Walk Test O2 Qualifying 11/16/19- desat to 88% on RA with max HR 106. O2 2L raised sat to 95%.          ROS-see HPI + = positive Constitutional:   No-   weight loss, night sweats, fevers, chills, +fatigue, lassitude. HEENT:   No-  headaches, difficulty swallowing, tooth/dental problems, sore throat,       No-  sneezing, itching, ear ache, nasal congestion, post nasal drip,  CV:  No-   chest pain, orthopnea, PND,+ swelling in lower extremities, anasarca,                                                     dizziness, palpitations Resp: + shortness of breath with exertion or at rest.              + productive cough,  No non-productive cough,  No- coughing up of blood.              No-   change in color of mucus. + wheezing.   Skin: No-   rash or lesions. GI:  No-  heartburn, indigestion, abdominal pain, nausea, vomiting,  GU:     MS:  No-   joint pain or swelling.  No- decreased range of motion.  + back pain, leg pain. Neuro-     + per HPI Psych:  No- change in mood or affect. No depression or anxiety.  No memory loss.  OBJ- Physical Exam General- Alert, Oriented, Affect-appropriate, Distress- none acute, +obese Skin- rash-none, lesions- none, excoriation- none Lymphadenopathy- none Head- atraumatic            Eyes- Gross vision intact, PERRLA, conjunctivae and secretions clear            Ears- Hearing, canals-normal            Nose- Clear, no-Septal dev, mucus, polyps, erosion, perforation             Throat- Mallampati II-III , mucosa clear , drainage-  none, tonsils- atrophic Neck- flexible , trachea midline, no stridor , thyroid nl, carotid no bruit Chest - symmetrical excursion , unlabored           Heart/CV- RRR , no murmur , no gallop  , no rub, nl s1 s2                           - JVD+2cm , edema +2, stasis changes- none,  superficial varices +           Lung- clear to P&A, wheeze- none, cough- none , dullness-none, rub- none           Chest wall-  Abd-  Br/ Gen/ Rectal- Not done, not indicated Extrem- +compression hose legs Neuro- grossly intact to observation, no unusual restlessness or movement, +cane

## 2019-11-16 NOTE — Progress Notes (Signed)
Patient identification verified. Message from Dr. Annamaria Boots provided.  Labs- cell counts are normal. Blood sugar is slightly elevated, but ok. D-dimer test for blood clotting is mildly elevated but ok pending result of leg vein doppler test. BNP is elevated, suggesting some degree of fluid overload. Hopefull the diuretic pill will help over next few days.  Patient verbalized understanding of results and plan of care.

## 2019-11-16 NOTE — Assessment & Plan Note (Addendum)
Qualifies now to add portable O2. She is not strong, has difficulty lifting into car, and is interested in getting a POC. Should do well with pulse regulated 2L during activity. She is waking with sats 85-88% at night.  Plan- work with her DME for portable O2. She can explore [employer] if [employer] can't help. Increase sleep O2 to 3Lfor now. We may need to assess for OSA, which could require in-lb due to O2 issues. Labs and CXR looking for correctable problems including CHF.

## 2019-11-22 ENCOUNTER — Other Ambulatory Visit: Payer: Self-pay

## 2019-11-22 DIAGNOSIS — J9611 Chronic respiratory failure with hypoxia: Secondary | ICD-10-CM

## 2019-11-23 DIAGNOSIS — R3915 Urgency of urination: Secondary | ICD-10-CM | POA: Diagnosis not present

## 2019-11-23 DIAGNOSIS — R8271 Bacteriuria: Secondary | ICD-10-CM | POA: Diagnosis not present

## 2019-11-23 DIAGNOSIS — N3020 Other chronic cystitis without hematuria: Secondary | ICD-10-CM | POA: Diagnosis not present

## 2019-11-23 DIAGNOSIS — N200 Calculus of kidney: Secondary | ICD-10-CM | POA: Diagnosis not present

## 2019-11-25 ENCOUNTER — Telehealth: Payer: Self-pay | Admitting: Internal Medicine

## 2019-11-25 DIAGNOSIS — R600 Localized edema: Secondary | ICD-10-CM

## 2019-11-25 DIAGNOSIS — R609 Edema, unspecified: Secondary | ICD-10-CM

## 2019-11-25 MED ORDER — ALBUTEROL SULFATE HFA 108 (90 BASE) MCG/ACT IN AERS: 1.0000 | INHALATION_SPRAY | Freq: Four times a day (QID) | RESPIRATORY_TRACT | 1 refills | Status: DC | PRN

## 2019-11-25 NOTE — Telephone Encounter (Signed)
PLease order  Doppler leg veins bilateral       dx    Peripheral edema

## 2019-11-25 NOTE — Telephone Encounter (Signed)
Spoke with pt, advised her that I would send in the Rx for Proair. Pt understood and Rx sent.   CY pt was asking about a leg doppler that she was supposed to have done? I don't see any record in the note. Please advise.

## 2019-11-25 NOTE — Telephone Encounter (Signed)
Spoke with the pt and notified of response per Dr Annamaria Boots and I have ordered the dopplers.

## 2019-11-29 ENCOUNTER — Ambulatory Visit (HOSPITAL_COMMUNITY)
Admission: RE | Admit: 2019-11-29 | Discharge: 2019-11-29 | Disposition: A | Discharge status: Home / Self Care | Payer: Medicare Other | Source: Ambulatory Visit | Attending: Cardiology | Admitting: Cardiology

## 2019-11-29 ENCOUNTER — Other Ambulatory Visit: Payer: Self-pay

## 2019-11-29 ENCOUNTER — Telehealth: Payer: Self-pay | Admitting: Internal Medicine

## 2019-11-29 DIAGNOSIS — R609 Edema, unspecified: Secondary | ICD-10-CM

## 2019-11-29 NOTE — Progress Notes (Signed)
Patient identification verified. Results of recent lower extremity venous US reviewed. Per Dr. Annamaria Boots, doppler leg vein studies- normal, negative for blood clot or obstruction in the leg veins. Patient verbalized understanding of results with no questions.

## 2019-11-29 NOTE — Telephone Encounter (Signed)
Called and spoke with pt. Pt stated that she has already received the results so nothing further needed.

## 2019-12-08 DIAGNOSIS — H353211 Exudative age-related macular degeneration, right eye, with active choroidal neovascularization: Secondary | ICD-10-CM | POA: Diagnosis not present

## 2019-12-09 ENCOUNTER — Telehealth: Payer: Self-pay | Admitting: Internal Medicine

## 2019-12-09 MED ORDER — FUROSEMIDE 20 MG PO TABS: 20.0000 mg | ORAL_TABLET | Freq: Every day | ORAL | 1 refills | Status: AC

## 2019-12-09 NOTE — Telephone Encounter (Signed)
Called and spoke with pt letting her know that we were going to send Rx for furosemide to [facility] for her and she verbalized understanding. Nothing further needed.

## 2019-12-09 NOTE — Telephone Encounter (Signed)
Ok to order furosemide 20 mg, # 90, ref x 1   [facility] Thanks

## 2019-12-09 NOTE — Telephone Encounter (Signed)
Spoke with pt. She is requesting a refill Furosemide. According to her last OV note, Dr. Annamaria Boots only wanted her to take this medication for 4 days. Pt states that it made a big improvement in her leg swelling but as soon as she stopped the medication, her legs swelled back up again.  Dr. Annamaria Boots - please advise. Thanks.

## 2019-12-13 ENCOUNTER — Telehealth: Payer: Self-pay | Admitting: Internal Medicine

## 2019-12-13 NOTE — Telephone Encounter (Signed)
Dr. Annamaria Boots  Notes 11/16/19   portable O2.   Should do well with pulse regulated 2L during activity.    Increase sleep O2 to 3L bedtime .   So plan is to : O2 2l/m with act and 3l/m at bedtime .   Left message for Luther Redo to return call

## 2019-12-14 NOTE — Telephone Encounter (Signed)
Spoke with [place] with [employer]. She is aware of Tammy's response. Nothing further was needed.

## 2019-12-27 ENCOUNTER — Other Ambulatory Visit (HOSPITAL_COMMUNITY)
Admission: RE | Admit: 2019-12-27 | Discharge: 2019-12-27 | Disposition: A | Discharge status: Home / Self Care | Payer: Medicare Other | Source: Ambulatory Visit | Attending: Internal Medicine | Admitting: Internal Medicine

## 2019-12-27 DIAGNOSIS — N200 Calculus of kidney: Secondary | ICD-10-CM | POA: Diagnosis not present

## 2019-12-27 DIAGNOSIS — Z20822 Contact with and (suspected) exposure to covid-19: Secondary | ICD-10-CM | POA: Insufficient documentation

## 2019-12-27 DIAGNOSIS — Z01812 Encounter for preprocedural laboratory examination: Secondary | ICD-10-CM | POA: Diagnosis not present

## 2019-12-27 DIAGNOSIS — N3020 Other chronic cystitis without hematuria: Secondary | ICD-10-CM | POA: Diagnosis not present

## 2019-12-27 LAB — SARS CORONAVIRUS 2 (TAT 6-24 HRS): SARS Coronavirus 2: NEGATIVE

## 2019-12-30 ENCOUNTER — Ambulatory Visit (INDEPENDENT_AMBULATORY_CARE_PROVIDER_SITE_OTHER): Payer: Medicare Other | Admitting: Internal Medicine

## 2019-12-30 ENCOUNTER — Other Ambulatory Visit: Payer: Self-pay

## 2019-12-30 ENCOUNTER — Encounter: Payer: Self-pay | Admitting: Internal Medicine

## 2019-12-30 VITALS — BP 130/72 | HR 68 | Temp 97.7°F | Ht 60.0 in | Wt 227.0 lb

## 2019-12-30 DIAGNOSIS — R609 Edema, unspecified: Secondary | ICD-10-CM | POA: Diagnosis not present

## 2019-12-30 DIAGNOSIS — J9611 Chronic respiratory failure with hypoxia: Secondary | ICD-10-CM

## 2019-12-30 DIAGNOSIS — J4530 Mild persistent asthma, uncomplicated: Secondary | ICD-10-CM | POA: Diagnosis not present

## 2019-12-30 LAB — PULMONARY FUNCTION TEST
DL/VA % pred: 135 %
DL/VA: 5.82 ml/min/mmHg/L
DLCO cor % pred: 66 %
DLCO cor: 10.78 ml/min/mmHg
DLCO unc % pred: 68 %
DLCO unc: 11.13 ml/min/mmHg
FEF 25-75 Post: 0.39 L/sec
FEF 25-75 Pre: 0.24 L/sec
FEF2575-%Change-Post: 61 %
FEF2575-%Pred-Post: 25 %
FEF2575-%Pred-Pre: 15 %
FEV1-%Change-Post: 19 %
FEV1-%Pred-Post: 36 %
FEV1-%Pred-Pre: 30 %
FEV1-Post: 0.64 L
FEV1-Pre: 0.54 L
FEV1FVC-%Change-Post: 4 %
FEV1FVC-%Pred-Pre: 76 %
FEV6-%Change-Post: 14 %
FEV6-%Pred-Post: 47 %
FEV6-%Pred-Pre: 41 %
FEV6-Post: 1.06 L
FEV6-Pre: 0.92 L
FEV6FVC-%Change-Post: 0 %
FEV6FVC-%Pred-Post: 104 %
FEV6FVC-%Pred-Pre: 103 %
FVC-%Change-Post: 13 %
FVC-%Pred-Post: 45 %
FVC-%Pred-Pre: 40 %
FVC-Post: 1.06 L
FVC-Pre: 0.94 L
Post FEV1/FVC ratio: 60 %
Post FEV6/FVC ratio: 99 %
Pre FEV1/FVC ratio: 58 %
Pre FEV6/FVC Ratio: 99 %
RV % pred: 83 %
RV: 1.64 L
TLC % pred: 70 %
TLC: 3.02 L

## 2019-12-30 NOTE — Progress Notes (Signed)
PFT done today. 

## 2019-12-30 NOTE — Progress Notes (Signed)
HPI female never smoker followed for Restless Legs, back pain that disturbs sleep, Asthma, Chronic Resp Failure w Hypoxia, complicated by peripheral venous disease, hemochromatosis/ Fe def anemia, Walk Test O2 Qualifying 11/16/19- desat to 88% on RA with max HR 106. O2 2L raised sat to 95%.  PFT 12/30/19- Moderate Restriction, Severe Obstruction, response to BD, Mild DLCO reduction ---------------------------------------------------------------------  11/16/19- 74 year old female never smoker followed for Restless Legs, back pain that disturbs sleep, Asthma, Chronic Resp Failure w Hypoxia, complicated by peripheral venous disease, hemochromatosis/ Fe def anemia, O2 was started with hospitalization after abdominal surgeries for partial bowel obstruction, hernia repair and hepatic cyst resection May, 2019 O2 2l / [facility] sleep and prn   Using a self-fill system Requip 0.5 mg/ 6 daily, Neurontin 300 mg tid, clonazepam 0.5 mg 1-3 hs, neb albuterol, Singulair, Breo 100,  ------f/u Chronic respirtory failure with hypoxia. O2 2L/MIN at night . Had 2 Moderna Covax Increased dyspnea x 2-3 weeks, cough clear sticky mucus. Started mucinex. Neb helps some.Waking 2-3 AM SOB w sats 85-88% on 2L. Takes extra neb Rx then.  Sleeps in recliner w head and legs elevated. Wearing compression hose. Asks about trying a diuretic. Wheezing. Scoliosis and back discomfort- hard to manage a heavy portable O2 tank. Still some leg jerk at night- discussed taking gabapentin 300mg  x 2 at hs.  Walk Test O2 Qualifying 11/16/19- desat to 88% on RA with max HR 106. O2 2L raised sat to 95%.   12/30/19-     74 year old female never smoker followed for Restless Legs, back pain that disturbs sleep, Asthma, Chronic Resp Failure w Hypoxia, complicated by peripheral venous disease, hemochromatosis/ Fe def anemia, O2 was started with hospitalization after abdominal surgeries for partial bowel obstruction, hernia repair and hepatic cyst resection  May, 2019, + Glaucoma, O2 3L sleep, 2L prnl / [facility] sleep and prn   Using a self-fill system Requip 0.5 mg/ 6 daily, Neurontin 300 mg tid, clonazepam 0.5 mg 1-3 hs, neb albuterol, Singulair, Breo 100,   Furosemide 20 mg Had 2 Moderna Covax Lab- BNP was 170 H, D-dimer 0.64 H,  PFT and labs reviewed. CXR 11/16/19-  IMPRESSION: Chronic bilateral interstitial thickening. No acute cardiopulmonary disease. PFT 12/30/19- Moderate Restriction, Severe Obstruction, response to BD, Mild DLCO reduction Doppler leg veins 11/29/19- Normal  ROS-see HPI + = positive Constitutional:   No-   weight loss, night sweats, fevers, chills, +fatigue, lassitude. HEENT:   No-  headaches, difficulty swallowing, tooth/dental problems, sore throat,       No-  sneezing, itching, ear ache, nasal congestion, post nasal drip,  CV:  No-   chest pain, orthopnea, PND,+ swelling in lower extremities, anasarca,                                                     dizziness, palpitations Resp: + shortness of breath with exertion or at rest.              + productive cough,  No non-productive cough,  No- coughing up of blood.              No-   change in color of mucus. + wheezing.   Skin: No-   rash or lesions. GI:  No-   heartburn, indigestion, abdominal pain, nausea, vomiting,  GU:     MS:  No-   joint pain or swelling.  No- decreased range of motion.  + back pain, leg pain. Neuro-     + per HPI Psych:  No- change in mood or affect. No depression or anxiety.  No memory loss.  OBJ- Physical Exam General- Alert, Oriented, Affect-appropriate, Distress- none acute, +obese Skin- rash-none, lesions- none, excoriation- none Lymphadenopathy- none Head- atraumatic            Eyes- Gross vision intact, PERRLA, conjunctivae and secretions clear            Ears- Hearing, canals-normal            Nose- Clear, no-Septal dev, mucus, polyps, erosion, perforation             Throat- Mallampati II-III , mucosa clear , drainage- none,  tonsils- atrophic Neck- flexible , trachea midline, no stridor , thyroid nl, carotid no bruit Chest - symmetrical excursion , unlabored           Heart/CV- RRR , no murmur , no gallop  , no rub, nl s1 s2                           - JVD+2cm , edema +2, stasis changes- none,  superficial varices +           Lung- clear to P&A, wheeze- none, cough- none , dullness-none, rub- none           Chest wall- +kyphoscoliosis Abd-  Br/ Gen/ Rectal- Not done, not indicated Extrem- +compression hose legs, +heavy legs Neuro- grossly intact to observation, no unusual restlessness or movement, +cane

## 2019-12-30 NOTE — Patient Instructions (Addendum)
Order- print script for Portable Oxygen concentrator   2L pulse    Dx  Chronic Respiratory Failure with Hypoxia  Please call if we can help

## 2020-01-27 ENCOUNTER — Other Ambulatory Visit: Payer: Self-pay | Admitting: Internal Medicine

## 2020-02-12 DIAGNOSIS — R600 Localized edema: Secondary | ICD-10-CM | POA: Insufficient documentation

## 2020-02-12 DIAGNOSIS — R609 Edema, unspecified: Secondary | ICD-10-CM | POA: Insufficient documentation

## 2020-02-12 NOTE — Assessment & Plan Note (Signed)
Consider Trelegy or Breo inhalers- discussed

## 2020-02-12 NOTE — Assessment & Plan Note (Signed)
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

## 2020-02-12 NOTE — Assessment & Plan Note (Signed)
Elevate legs. Continue low salt diet, elastic hose

## 2020-02-28 DIAGNOSIS — N3020 Other chronic cystitis without hematuria: Secondary | ICD-10-CM | POA: Diagnosis not present

## 2020-02-28 DIAGNOSIS — N200 Calculus of kidney: Secondary | ICD-10-CM | POA: Diagnosis not present

## 2020-02-28 DIAGNOSIS — N3281 Overactive bladder: Secondary | ICD-10-CM | POA: Diagnosis not present

## 2020-03-17 DIAGNOSIS — H353211 Exudative age-related macular degeneration, right eye, with active choroidal neovascularization: Secondary | ICD-10-CM | POA: Diagnosis not present

## 2020-03-20 ENCOUNTER — Other Ambulatory Visit: Payer: Self-pay | Admitting: Internal Medicine

## 2020-03-26 ENCOUNTER — Other Ambulatory Visit: Payer: Self-pay | Admitting: Internal Medicine

## 2020-03-31 DIAGNOSIS — N3020 Other chronic cystitis without hematuria: Secondary | ICD-10-CM | POA: Diagnosis not present

## 2020-03-31 DIAGNOSIS — N200 Calculus of kidney: Secondary | ICD-10-CM | POA: Diagnosis not present

## 2020-03-31 DIAGNOSIS — N3941 Urge incontinence: Secondary | ICD-10-CM | POA: Diagnosis not present

## 2020-03-31 DIAGNOSIS — N3281 Overactive bladder: Secondary | ICD-10-CM | POA: Diagnosis not present

## 2020-04-13 DIAGNOSIS — M545 Low back pain: Secondary | ICD-10-CM | POA: Diagnosis not present

## 2020-04-13 DIAGNOSIS — N3941 Urge incontinence: Secondary | ICD-10-CM | POA: Diagnosis not present

## 2020-04-13 DIAGNOSIS — R350 Frequency of micturition: Secondary | ICD-10-CM | POA: Diagnosis not present

## 2020-04-17 ENCOUNTER — Telehealth: Payer: Self-pay | Admitting: Internal Medicine

## 2020-04-17 NOTE — Progress Notes (Signed)
  Chronic Care Management   Note  04/17/2020 Name: HAZELYNN MCKENNY MRN: [id number] DOB: Oct 30, 1945  ALEX MCMANIGAL is a 74 y.o. year old female who is a primary care patient of Hoyt Koch, MD. I reached out to [name] by phone today in response to a referral sent by Ms. Michaelene Song [place]'s PCP, Hoyt Koch, MD.   Ms. Collison was given information about Chronic Care Management services today including:  1. CCM service includes personalized support from designated clinical staff supervised by her physician, including individualized plan of care and coordination with other care providers 2. 24/7 contact phone numbers for assistance for urgent and routine care needs. 3. Service will only be billed when office clinical staff spend 20 minutes or more in a month to coordinate care. 4. Only one practitioner may furnish and bill the service in a calendar month. 5. The patient may stop CCM services at any time (effective at the end of the month) by phone call to the office staff.   Patient agreed to services and verbal consent obtained.   Follow up plan:   Carley Perdue [employer] Scheduler

## 2020-05-01 ENCOUNTER — Telehealth: Payer: Self-pay | Admitting: Internal Medicine

## 2020-05-01 NOTE — Progress Notes (Signed)
  Chronic Care Management   Note  05/01/2020 Name: Lisa Hamilton MRN: [id number] DOB: Dec 04, 1945  Lisa Hamilton is a 74 y.o. year old female who is a primary care patient of Hoyt Koch, MD. I reached out to [name] by phone today in response to a referral sent by [name]'s PCP, Hoyt Koch, MD.   [name] was given information about Chronic Care Management services today including:  1. CCM service includes personalized support from designated clinical staff supervised by her physician, including individualized plan of care and coordination with other care providers 2. 24/7 contact phone numbers for assistance for urgent and routine care needs. 3. Service will only be billed when office clinical staff spend 20 minutes or more in a month to coordinate care. 4. Only one practitioner may furnish and bill the service in a calendar month. 5. The patient may stop CCM services at any time (effective at the end of the month) by phone call to the office staff.   Patient agreed to services and verbal consent obtained.   Follow up plan:   Carley Perdue [employer] Scheduler

## 2020-05-01 NOTE — Progress Notes (Signed)
  Chronic Care Management   Outreach Note  05/01/2020 Name: Lisa Hamilton MRN: [id number] DOB: 1946-05-20  Referred by: Hoyt Koch, MD Reason for referral : No chief complaint on file.   A second unsuccessful telephone outreach was attempted today. The patient was referred to pharmacist for assistance with care management and care coordination.  Follow Up Plan:   Carley Perdue [employer] Scheduler

## 2020-05-05 DIAGNOSIS — H353211 Exudative age-related macular degeneration, right eye, with active choroidal neovascularization: Secondary | ICD-10-CM | POA: Diagnosis not present

## 2020-05-10 DIAGNOSIS — N3281 Overactive bladder: Secondary | ICD-10-CM | POA: Diagnosis not present

## 2020-05-10 DIAGNOSIS — N3941 Urge incontinence: Secondary | ICD-10-CM | POA: Diagnosis not present

## 2020-05-10 DIAGNOSIS — R350 Frequency of micturition: Secondary | ICD-10-CM | POA: Diagnosis not present

## 2020-05-18 DIAGNOSIS — Z23 Encounter for immunization: Secondary | ICD-10-CM | POA: Diagnosis not present

## 2020-05-26 ENCOUNTER — Other Ambulatory Visit: Payer: Self-pay | Admitting: Internal Medicine

## 2020-05-27 DIAGNOSIS — Z23 Encounter for immunization: Secondary | ICD-10-CM | POA: Diagnosis not present

## 2020-05-29 NOTE — Telephone Encounter (Signed)
Pt is requesting refill on Montelukast Sodium 10 MG Oral Tablet next ov 07/06/20

## 2020-05-29 NOTE — Telephone Encounter (Signed)
Meds refilled as requested.

## 2020-05-29 NOTE — Telephone Encounter (Signed)
Pt is requesting refill on CLONAZEPAM  0.5MG   TAB next ov 07/06/20

## 2020-06-08 DIAGNOSIS — M545 Low back pain, unspecified: Secondary | ICD-10-CM | POA: Diagnosis not present

## 2020-06-08 DIAGNOSIS — N3941 Urge incontinence: Secondary | ICD-10-CM | POA: Diagnosis not present

## 2020-06-08 DIAGNOSIS — R3915 Urgency of urination: Secondary | ICD-10-CM | POA: Diagnosis not present

## 2020-06-08 DIAGNOSIS — R350 Frequency of micturition: Secondary | ICD-10-CM | POA: Diagnosis not present

## 2020-06-19 ENCOUNTER — Telehealth: Payer: Self-pay | Admitting: Pharmacist

## 2020-06-19 NOTE — Progress Notes (Signed)
Chronic Care Management Pharmacy Assistant   Name: Lisa Hamilton  MRN: [id number] DOB: 1946-04-01  Reason for Encounter: Initial Questions for Pharmacist visit on 06/20/2020   PCP : Hoyt Koch, MD  Allergies:   Allergies  Allergen Reactions  . Hydromorphone Hcl Other (See Comments)    Disorientation and delirium.   Marland Kitchen Penicillins Rash and Other (See Comments)    PATIENT HAS HAD A PCN REACTION WITH IMMEDIATE RASH, FACIAL/TONGUE/THROAT SWELLING, SOB, OR LIGHTHEADEDNESS WITH HYPOTENSION:  #  #  YES  #  #  Has patient had a PCN reaction causing severe rash involving mucus membranes or skin necrosis: No Has patient had a PCN reaction that required hospitalization: No Has patient had a PCN reaction occurring within the last 10 years: No If all of the above answers are "NO", then may proceed with Cephalosporin use.   . Adhesive [Tape] Rash and Other (See Comments)    Blisters The clear tape   . Amoxicillin-Pot Clavulanate Rash  . Codeine Other (See Comments)    nightmares    Medications: Outpatient Encounter Medications as of 06/19/2020  Medication Sig  . acetaminophen (TYLENOL) 500 MG tablet Take 1,000 mg by mouth 3 (three) times daily as needed for moderate pain or headache.  . albuterol (PROVENTIL) (2.5 MG/3ML) 0.083% nebulizer solution USE 1 VIAL IN NEBULIZER 4 TIMES DAILY. Generic: VENTOLIN (Patient taking differently: Take 2.5 mg by nebulization 4 (four) times daily. USE 1 VIAL IN NEBULIZER 2 TIMES DAILY. Generic: VENTOLIN)  . albuterol (VENTOLIN HFA) 108 (90 Base) MCG/ACT inhaler USE 1 TO 2 INHALATIONS BY  MOUTH EVERY 6 HOURS AS  NEEDED FOR WHEEZING OR  SHORTNESS OF BREATH  . aspirin EC 81 MG tablet Take 162 mg by mouth at bedtime.  . Calcium Carb-Cholecalciferol (CALCIUM 600+D) 600-800 MG-UNIT TABS Take 1 tablet by mouth 2 (two) times daily.  . cephALEXin (KEFLEX) 250 MG capsule Take 250 mg by mouth daily.  . Cholecalciferol (VITAMIN D) 2000 units tablet Take  2,000 Units by mouth daily.  . clonazePAM (KLONOPIN) 0.5 MG tablet TAKE 1 TO 3 TABLETS BY  MOUTH 30 MINUTES BEFORE  BEDTIME  . Co-Enzyme Q-10 100 MG CAPS Take 100 mg by mouth daily.   . diclofenac Sodium (VOLTAREN) 1 % GEL   . fluticasone (FLONASE) 50 MCG/ACT nasal spray Place 2 sprays into both nostrils daily.  . fluticasone furoate-vilanterol (BREO ELLIPTA) 100-25 MCG/INH AEPB Inhale 1 puff then rinse mouth,once daily  . furosemide (LASIX) 20 MG tablet Take 1 tablet (20 mg total) by mouth daily.  Marland Kitchen gabapentin (NEURONTIN) 300 MG capsule TAKE 1 CAPSULE BY MOUTH 3  TIMES DAILY  . guaiFENesin (MUCINEX) 600 MG 12 hr tablet Take 600 mg by mouth 2 (two) times daily as needed (congestion).   Marland Kitchen ibuprofen (ADVIL,MOTRIN) 200 MG tablet Take 400 mg by mouth 3 (three) times daily as needed for headache or moderate pain.  Marland Kitchen losartan (COZAAR) 50 MG tablet TAKE 1 TABLET BY MOUTH  DAILY  . Magnesium 400 MG CAPS Take 400 mg by mouth 2 (two) times daily.   . montelukast (SINGULAIR) 10 MG tablet TAKE 1 TABLET BY MOUTH  EVERY EVENING.  . Multiple Vitamin (MULTIVITAMIN) capsule Take 1 capsule by mouth daily. Iron free  . Multiple Vitamins-Minerals (PRESERVISION AREDS 2) CAPS Take 1 capsule by mouth 2 (two) times daily.  . Omega-3 Fatty Acids (FISH OIL) 1200 MG CAPS Take 1,200 mg by mouth daily.  . OXYGEN Inhale 2 L  into the lungs at bedtime.   . polyethylene glycol (MIRALAX / GLYCOLAX) packet Take 17 g by mouth 3 (three) times daily as needed for moderate constipation.  . pravastatin (PRAVACHOL) 40 MG tablet TAKE 1 TABLET BY MOUTH  DAILY  . Propylene Glycol (SYSTANE BALANCE OP) Place 1 drop into both eyes daily as needed (dry eyes).  Marland Kitchen rOPINIRole (REQUIP) 0.5 MG tablet TAKE UP TO 6 TABLETS BY  MOUTH DAILY AS NEEDED  . traMADol (ULTRAM) 50 MG tablet Take 1 tablet (50 mg total) by mouth daily as needed.   No facility-administered encounter medications on file as of 06/19/2020.    Current Diagnosis: Patient  Active Problem List   Diagnosis Date Noted  . Peripheral edema 02/12/2020  . History of colonic polyps   . Chronic respiratory failure with hypoxia ([place]) 03/04/2018  . Nephrolithiasis 10/15/2017  . Anemia 12/22/2015  . Hyperglycemia 01/04/2014  . NONTOXIC MULTINODULAR GOITER 08/06/2008  . HYPERCHOLESTEROLEMIA 08/06/2008  . RESTLESS LEG SYNDROME 08/06/2008  . Essential hypertension 08/06/2008  . Diverticulosis of colon without hemorrhage 08/06/2008  . Liver cyst s/p partial hepatectomy 12/04/2017 08/06/2008  . MEDULLARY SPONGE KIDNEY 08/06/2008  . VITAMIN D DEFICIENCY 10/21/2007  . Morbid obesity ([place]) 10/21/2007  . GLAUCOMA 10/21/2007  . Asthma in adult, mild persistent, uncomplicated 52/84/1324  . Osteoarthritis 10/21/2007  . Osteoporosis 10/21/2007    Goals Addressed   None     Follow-Up:  Pharmacist Review   Patient was seen by Clinical Pharmacist Mendel Ryder on 06/20/20    Wendy Poet, [place]

## 2020-06-20 ENCOUNTER — Ambulatory Visit: Payer: Medicare Other | Admitting: Pharmacist

## 2020-06-20 ENCOUNTER — Other Ambulatory Visit: Payer: Self-pay

## 2020-06-20 DIAGNOSIS — J4530 Mild persistent asthma, uncomplicated: Secondary | ICD-10-CM

## 2020-06-20 DIAGNOSIS — I10 Essential (primary) hypertension: Secondary | ICD-10-CM

## 2020-06-20 DIAGNOSIS — E7800 Pure hypercholesterolemia, unspecified: Secondary | ICD-10-CM

## 2020-06-20 NOTE — Chronic Care Management (AMB) (Signed)
 Chronic Care Management Pharmacy  Name: Lisa Hamilton  MRN: [id number] DOB: 10/09/1945   Chief Complaint/ HPI  Lisa Hamilton,  74 y.o. , female presents for their Initial CCM visit with the clinical pharmacist In office.  PCP : Crawford, Elizabeth A, MD Patient Care Team: Crawford, Elizabeth A, MD as PCP - General (Internal Medicine) Wrenn, John, MD (Urology) Lomax, Laura, MD (Dermatology) Shapiro, Mark, MD (Ophthalmology) [name], Max T, DPM (Podiatry) Young, Clinton D, MD (Pulmonary Disease) Jacobs, Daniel P, MD (Gastroenterology) Clark, Dyanna, MD as Consulting Physician (Obstetrics) Evans, Brent M, DPM as Consulting Physician (Podiatry) Clark, Dyanna, MD as Consulting Physician (Obstetrics) Ramos, Richard, MD as Consulting Physician (Physical Medicine and Rehabilitation) ,  N, RPH as Pharmacist (Pharmacist)  Their chronic conditions include: Hypertension, Hyperlipidemia, Asthma, Osteoporosis and Osteoarthritis, morbid obesity, anemia, RLS  Patient lives with husband in [place]. She spends a lot of time going to doctors' visits for her and her husband. She gets a massage every 2 weeks.  Office Visits: 07/23/19 Dr Crawford OV: chronic f/u BP, neck pain, osteoporosis. DEXA due.  Consult Visit: 02/10/20 Dr Shapiro ([facility]): f/u for macular degeneration  12/30/19 Dr Young (pulmonary): f/u for asthma, chronic resp failure, RLS. Consider Trelegy or Breo. Ordered portable O2 concentrator.   11/05/19 Dr Hewitt (ortho): f/u for pain in feet, osteoarthritis, lymphedema.  09/16/19 Dr Lomax (dermatology): f/u for skin scancer  Allergies  Allergen Reactions  . Hydromorphone Hcl Other (See Comments)    Disorientation and delirium.   . Penicillins Rash and Other (See Comments)    PATIENT HAS HAD A PCN REACTION WITH IMMEDIATE RASH, FACIAL/TONGUE/THROAT SWELLING, SOB, OR LIGHTHEADEDNESS WITH HYPOTENSION:  #  #  YES  #  #  Has patient had a PCN reaction causing severe  rash involving mucus membranes or skin necrosis: No Has patient had a PCN reaction that required hospitalization: No Has patient had a PCN reaction occurring within the last 10 years: No If all of the above answers are "NO", then may proceed with Cephalosporin use.   . Adhesive [Tape] Rash and Other (See Comments)    Blisters The clear tape   . Amoxicillin-Pot Clavulanate Rash  . Codeine Other (See Comments)    nightmares    Medications: Outpatient Encounter Medications as of 06/20/2020  Medication Sig Note  . acetaminophen (TYLENOL) 500 MG tablet Take 1,000 mg by mouth 3 (three) times daily as needed for moderate pain or headache.   . albuterol (PROVENTIL) (2.5 MG/3ML) 0.083% nebulizer solution USE 1 VIAL IN NEBULIZER 4 TIMES DAILY. Generic: VENTOLIN (Patient taking differently: Take 2.5 mg by nebulization 4 (four) times daily. USE 1 VIAL IN NEBULIZER 2 TIMES DAILY. Generic: VENTOLIN)   . albuterol (VENTOLIN HFA) 108 (90 Base) MCG/ACT inhaler USE 1 TO 2 INHALATIONS BY  MOUTH EVERY 6 HOURS AS  NEEDED FOR WHEEZING OR  SHORTNESS OF BREATH   . aspirin EC 81 MG tablet Take 162 mg by mouth at bedtime.   . Calcium Carb-Cholecalciferol (CALCIUM 600+D) 600-800 MG-UNIT TABS Take 1 tablet by mouth 2 (two) times daily.   . cephALEXin (KEFLEX) 250 MG capsule Take 250 mg by mouth daily.   . Cholecalciferol (VITAMIN D) 2000 units tablet Take 2,000 Units by mouth daily.   . clonazePAM (KLONOPIN) 0.5 MG tablet TAKE 1 TO 3 TABLETS BY  MOUTH 30 MINUTES BEFORE  BEDTIME 06/20/2020: Takes 1 tab in evening and 1/2 tab at bedtime  . Co-Enzyme Q-10 100 MG CAPS Take   100 mg by mouth daily.    . Cranberry 180 MG CAPS Take 1 capsule by mouth daily.   . diclofenac Sodium (VOLTAREN) 1 % GEL    . fluticasone (FLONASE) 50 MCG/ACT nasal spray Place 2 sprays into both nostrils daily.   . fluticasone furoate-vilanterol (BREO ELLIPTA) 100-25 MCG/INH AEPB Inhale 1 puff then rinse mouth,once daily   . furosemide (LASIX) 20  MG tablet Take 1 tablet (20 mg total) by mouth daily. (Patient taking differently: Take 20 mg by mouth daily as needed. )   . gabapentin (NEURONTIN) 300 MG capsule TAKE 1 CAPSULE BY MOUTH 3  TIMES DAILY   . guaiFENesin (MUCINEX) 600 MG 12 hr tablet Take 600 mg by mouth 2 (two) times daily as needed (congestion).    . ibuprofen (ADVIL,MOTRIN) 200 MG tablet Take 400 mg by mouth 3 (three) times daily as needed for headache or moderate pain.   . losartan (COZAAR) 50 MG tablet TAKE 1 TABLET BY MOUTH  DAILY   . Magnesium 400 MG CAPS Take 400 mg by mouth 2 (two) times daily.    . montelukast (SINGULAIR) 10 MG tablet TAKE 1 TABLET BY MOUTH  EVERY EVENING.   . Multiple Vitamins-Minerals (PRESERVISION AREDS 2) CAPS Take 1 capsule by mouth 2 (two) times daily.   . Omega-3 Fatty Acids (FISH OIL) 1200 MG CAPS Take 1,200 mg by mouth daily.   . OXYGEN Inhale 2 L into the lungs at bedtime.    . polyethylene glycol (MIRALAX / GLYCOLAX) packet Take 17 g by mouth 3 (three) times daily as needed for moderate constipation.   . pravastatin (PRAVACHOL) 40 MG tablet TAKE 1 TABLET BY MOUTH  DAILY   . Propylene Glycol (SYSTANE BALANCE OP) Place 1 drop into both eyes daily as needed (dry eyes).   . rOPINIRole (REQUIP) 0.5 MG tablet TAKE UP TO 6 TABLETS BY  MOUTH DAILY AS NEEDED   . traMADol (ULTRAM) 50 MG tablet Take 1 tablet (50 mg total) by mouth daily as needed.   . vitamin B-12 (CYANOCOBALAMIN) 1000 MCG tablet Take 1,000 mcg by mouth daily.   . [DISCONTINUED] Multiple Vitamin (MULTIVITAMIN) capsule Take 1 capsule by mouth daily. Iron free    No facility-administered encounter medications on file as of 06/20/2020.    Wt Readings from Last 3 Encounters:  12/30/19 227 lb (103 kg)  11/16/19 226 lb (102.5 kg)  07/23/19 216 lb (98 kg)    Current Diagnosis/Assessment:  SDOH Interventions     Most Recent Value  SDOH Interventions  Financial Strain Interventions Intervention Not Indicated      Goals Addressed             This Visit's Progress   . Pharmacy Care Plan       CARE PLAN ENTRY (see longitudinal plan of care for additional care plan information)  Current Barriers:  . Chronic Disease Management support, education, and care coordination needs related to Hypertension, Hyperlipidemia, and Asthma   Hypertension BP Readings from Last 3 Encounters:  12/30/19 130/72  11/16/19 138/74  07/23/19 136/84 .  Pharmacist Clinical Goal(s): o Over the next 60 days, patient will work with PharmD and providers to achieve BP goal <130/80 . Current regimen:  o Losartan 50 mg daily o Furosemide 20 mg daily . Interventions: o Discussed BP goals and benefits of medications for prevention of heart attack / stroke . Patient self care activities - Over the next 60 days, patient will: o Check BP daily, document, and   provide at future appointments o Ensure daily salt intake < 2300 mg/day  Hyperlipidemia Lab Results  Component Value Date/Time   LDLCALC 148 (H) 01/08/2019 09:24 AM .  Pharmacist Clinical Goal(s): o Over the next 60 days, patient will work with PharmD and providers to achieve LDL goal < 100 . Current regimen:  o Pravastatin 40 mg daily o Coenzyme Q10 100 mg daily o Aspirin 81 mg daily o Omega-3 FA Fish Oil 1000 mg daily . Interventions: o Discussed cholesterol goals and benefits of medications for prevention of heart attack / stroke. May consider switch to rosuvastatin if LDL above goal at next chekc . Patient self care activities - Over the next 60 days, patient will: o Continue medications as precribed o Follow up with PCP as scheduled  Asthma . Pharmacist Clinical Goal(s) o Over the next 60 days, patient will work with PharmD and providers to optmze therapy . Current regimen:  o Albuterol 0.083% nebulizer soln twice daily as needed o Albuterol HFA as needed o Breo Ellipta 100-25 mcg/inh 1 puff daily o Montelukast 10 mg daily at bedtime . Interventions: o Discussed benefits  of inhalers and proper inhaler technique o Patient's income is too high for patient assistance . Patient self care activities - Over the next 60 days, patient will: o Continue current medications  Medication management . Pharmacist Clinical Goal(s): o Over the next 60 days, patient will work with PharmD and providers to maintain optimal medication adherence . Current pharmacy: [facility] . Interventions o Comprehensive medication review performed. o Continue current medication management strategy . Patient self care activities - Over the next 60 days, patient will: o Focus on medication adherence by pill box o Take medications as prescribed o Report any questions or concerns to PharmD and/or provider(s)  Initial goal documentation       Hypertension   BP goal is:  <130/80  Office blood pressures are  BP Readings from Last 3 Encounters:  12/30/19 130/72  11/16/19 138/74  07/23/19 136/84   Lab Results  Component Value Date   CREATININE 0.47 11/16/2019   BUN 16 11/16/2019   GFR 130.11 11/16/2019   GFRNONAA >60 12/11/2017   GFRAA >60 12/11/2017   NA 141 11/16/2019   K 3.9 11/16/2019   CALCIUM 9.1 11/16/2019   CO2 31 11/16/2019   Patient checks BP at home infrequently Patient home BP readings are ranging: n/a  Patient has failed these meds in the past: n/a Patient is currently controlled on the following medications:  . Losartan 50 mg daily . Furosemide 20 mg PRN  We discussed diet and exercise extensively; pt takes furosemide infrequently due to urination; she does wear compression socks; advised to reduce salt in diet  Plan  Continue current medications and control with diet and exercise     Hyperlipidemia   LDL goal 30% reduction (<100)  Last lipids Lab Results  Component Value Date   CHOL 221 (H) 01/08/2019   HDL 54.80 01/08/2019   LDLCALC 148 (H) 01/08/2019   TRIG 93.0 01/08/2019   CHOLHDL 4 01/08/2019   Hepatic Function Latest Ref Rng  & Units 11/16/2019 01/08/2019 07/14/2018  Total Protein 6.0 - 8.3 g/dL 6.7 6.7 6.8  Albumin 3.5 - 5.2 g/dL 4.0 4.0 3.9  AST 0 - 37 U/L 18 15 18  ALT 0 - 35 U/L 18 16 17  Alk Phosphatase 39 - 117 U/L 91 88 103  Total Bilirubin 0.2 - 1.2 mg/dL 0.5 0.5 0.5    Bilirubin, Direct 0.01 - 0.4 mg/dL - - -     The 10-year ASCVD risk score (Goff DC Jr., et al., 2013) is: 16.3%   Values used to calculate the score:     Age: 73 years     Sex: Female     Is Non-Hispanic African American: No     Diabetic: No     Tobacco smoker: No     Systolic Blood Pressure: 130 mmHg     Is BP treated: Yes     HDL Cholesterol: 54.8 mg/dL     Total Cholesterol: 221 mg/dL   Patient has failed these meds in past: atorvastatin 20 mg, simvastatin 40 mg,  Patient is currently uncontrolled on the following medications:  . Pravastatin 40 mg daily . Coenzyme Q10 100 mg daily . Aspirin 81 mg daily . Omega-3 FA Fish Oil 1000 mg daily  We discussed:  diet and exercise extensively ; Cholesterol goals; benefits of statin for ASCVD risk reduction; pt is due for repeat lipid panel, she has f/u with PCP next month; discussed previous side effects with statins were muscle aches; she denies issues with pravastatin; may consider switch to rosuvastatin if higher intensity statin is needed to achieve LDL goal  Plan  Continue current medications and control with diet and exercise   Asthma / Chronic respiratory failure   Last spirometry score: 12/30/19 -FEV1 36% predicted -FEV1 19% change  Patient has failed these meds in past: n/a Patient is currently controlled on the following medications:  . Albuterol 0.083% nebulizer soln BID prn . Albuterol HFA prn . Breo Ellipta 100-25 mcg/inh 1 puff daily . Montelukast 10 mg daily HS  Using maintenance inhaler regularly? Yes Frequency of rescue inhaler use:  multiple times per day  We discussed:  proper inhaler technique; pt follows regularly with pulmonology; pt reports Breo is  expensive but she does not qualify for patient assistance  Plan  Continue current medications   Osteoporosis   Last DEXA Scan: 10/12/19  T-Score femoral neck: -1.7  T-Score total hip: -1.6  T-Score lumbar spine: n/a  T-Score forearm radius: -3.2  10-year probability of major osteoporotic fracture: n/a  10-year probability of hip fracture: n/a  Last vitamin D/Calcium Lab Results  Component Value Date   CALCIUM 9.1 11/16/2019   Patient is a candidate for pharmacologic treatment due to T score < 2.5  Patient has failed these meds in past: alendronate (2012) Patient is currently controlled on the following medications:  . Calcium 600 mg-Vitamin D 800 IU BID . Vitamin D 2000 IU daily  We discussed:  Recommend 800-1000 units of vitamin D daily. Recommend 1200 mg of calcium daily from dietary and supplemental sources.  Plan  Continue current medications  RLS   Patient has failed these meds in past: n/a Patient is currently controlled on the following medications:  . Ropinirole 0.5 mg - up to 6 tab daily (Dr Young) . Gabapentin 300 mg TID (Dr Young) . Clonazepam 0.5 mg - 1 tab PM and 1/2 tab HS  We discussed:  Benefits/risks of medications; long term risk associated with benzodiazepines; advised pt to use lowest effective dose of clonazepam  Plan  Continue current medications  Pain   Osteoarthritis Cervical disc degeneration Lumbar disc degeneration  Patient has failed these meds in past: n/a Patient is currently controlled on the following medications:  . Tylenol 500 mg PRN . Ibuprofen 200 mg PRN . Tramadol 50 mg daily PRN (ordered 07/2017) . Gabapentin 300 mg   TID . Voltaren 1% gel PRN  We discussed:  Pt reports she takes tramadol infrequently; mainly treats pain with Ibuprofen and Tylenol; pt follows with pain management  Plan  Continue current medications  Frequent UTI   Patient has failed these meds in past: n/a Patient is currently controlled on the  following medications:  . Cephalexin 250 mg daily  We discussed:  Pt has follow up with urologist next month and will decide then whether to continue prophylaxis  Plan  Continue current medications  Health Maintenance   Patient is currently controlled on the following medications:  . Miralax PRN . Magnesium 400 mg BID . Multivitamin . Preservision Areds 2 . Systane eye drops PRN . Fluticasone nasal spray PRN . Mucinex 600 mg PRN . [facility] . Super Beets  We discussed:  Patient is satisfied with current regimen and denies issues. Pt would like more information about cost-effectiveness of [facility] and Super Beets supplements  Plan  Continue current medications  Medication Management   Pt uses [facility] pharmacy for all medications Uses pill box? Yes Pt endorses 100% compliance  We discussed: Current pharmacy is preferred with insurance plan and patient is satisfied with pharmacy services; pt is also switching to a [employer] plan next year after speaking with [employer] representatives for cost savings. Medications will need to be switched to [facility] mail order pharmacy in January.  Plan  Continue current medication management strategy  Switch rx's to [facility] mail order in January     Follow up: 2 month phone visit   , PharmD, BCACP Clinical Pharmacist [facility] [phone]  

## 2020-06-20 NOTE — Patient Instructions (Addendum)
Visit Information  Phone number for Pharmacist: [phone]  Thank you for meeting with me to discuss your medications! I look forward to working with you to achieve your health care goals. Below is a summary of what we talked about during the visit:  Goals Addressed            This Visit's Progress   . Pharmacy Care Plan       CARE PLAN ENTRY (see longitudinal plan of care for additional care plan information)  Current Barriers:  . Chronic Disease Management support, education, and care coordination needs related to Hypertension, Hyperlipidemia, and Asthma   Hypertension BP Readings from Last 3 Encounters:  12/30/19 130/72  11/16/19 138/74  07/23/19 136/84 .  Pharmacist Clinical Goal(s): o Over the next 60 days, patient will work with PharmD and providers to achieve BP goal <130/80 . Current regimen:  o Losartan 50 mg daily o Furosemide 20 mg daily . Interventions: o Discussed BP goals and benefits of medications for prevention of heart attack / stroke . Patient self care activities - Over the next 60 days, patient will: o Check BP daily, document, and provide at future appointments o Ensure daily salt intake < 2300 mg/day  Hyperlipidemia Lab Results  Component Value Date/Time   LDLCALC 148 (H) 01/08/2019 09:24 AM .  Pharmacist Clinical Goal(s): o Over the next 60 days, patient will work with PharmD and providers to achieve LDL goal < 100 . Current regimen:  o Pravastatin 40 mg daily o Coenzyme Q10 100 mg daily o Aspirin 81 mg daily o Omega-3 FA Fish Oil 1000 mg daily . Interventions: o Discussed cholesterol goals and benefits of medications for prevention of heart attack / stroke. May consider switch to rosuvastatin if LDL above goal at next chekc . Patient self care activities - Over the next 60 days, patient will: o Continue medications as precribed o Follow up with PCP as scheduled  Asthma . Pharmacist Clinical Goal(s) o Over the next 60 days, patient will  work with PharmD and providers to optmze therapy . Current regimen:  o Albuterol 0.083% nebulizer soln twice daily as needed o Albuterol HFA as needed o Breo Ellipta 100-25 mcg/inh 1 puff daily o Montelukast 10 mg daily at bedtime . Interventions: o Discussed benefits of inhalers and proper inhaler technique o Patient's income is too high for patient assistance . Patient self care activities - Over the next 60 days, patient will: o Continue current medications  Medication management . Pharmacist Clinical Goal(s): o Over the next 60 days, patient will work with PharmD and providers to maintain optimal medication adherence . Current pharmacy: [facility] . Interventions o Comprehensive medication review performed. o Continue current medication management strategy . Patient self care activities - Over the next 60 days, patient will: o Focus on medication adherence by pill box o Take medications as prescribed o Report any questions or concerns to PharmD and/or provider(s)  Initial goal documentation      Ms. Czerwinski was given information about Chronic Care Management services today including:  1. CCM service includes personalized support from designated clinical staff supervised by her physician, including individualized plan of care and coordination with other care providers 2. 24/7 contact phone numbers for assistance for urgent and routine care needs. 3. Standard insurance, coinsurance, copays and deductibles apply for chronic care management only during months in which we provide at least 20 minutes of these services. Most insurances cover these services at 100%, however patients  may be responsible for any copay, coinsurance and/or deductible if applicable. This service may help you avoid the need for more expensive face-to-face services. 4. Only one practitioner may furnish and bill the service in a calendar month. 5. The patient may stop CCM services at any time (effective at  the end of the month) by phone call to the office staff.  Patient agreed to services and verbal consent obtained.   The patient verbalized understanding of instructions, educational materials, and care plan provided today and agreed to receive a mailed copy of patient instructions, educational materials, and care plan.  Telephone follow up appointment with pharmacy team member scheduled for: 2 months  Charlene Brooke, PharmD, BCACP Clinical Pharmacist [facility] [phone]  Cholesterol Content in Foods Cholesterol is a waxy, fat-like substance that helps to carry fat in the blood. The body needs cholesterol in small amounts, but too much cholesterol can cause damage to the arteries and heart. Most people should eat less than 200 milligrams (mg) of cholesterol a day. Foods with cholesterol  Cholesterol is found in animal-based foods, such as meat, seafood, and dairy. Generally, low-fat dairy and lean meats have less cholesterol than full-fat dairy and fatty meats. The milligrams of cholesterol per serving (mg per serving) of common cholesterol-containing foods are listed below. Meat and other proteins  Egg -- one large whole egg has 186 mg.  Veal shank -- 4 oz has 141 mg.  Lean ground [place] (93% lean) -- 4 oz has 118 mg.  Fat-trimmed lamb loin -- 4 oz has 106 mg.  Lean ground beef (90% lean) -- 4 oz has 100 mg.  Lobster -- 3.5 oz has 90 mg.  Pork loin chops -- 4 oz has 86 mg.  Canned salmon -- 3.5 oz has 83 mg.  Fat-trimmed beef top loin -- 4 oz has 78 mg.  Frankfurter -- 1 frank (3.5 oz) has 77 mg.  Crab -- 3.5 oz has 71 mg.  Roasted chicken without skin, white meat -- 4 oz has 66 mg.  Light bologna -- 2 oz has 45 mg.  Deli-cut [place] -- 2 oz has 31 mg.  Canned tuna -- 3.5 oz has 31 mg.  Berniece Salines -- 1 oz has 29 mg.  Oysters and mussels (raw) -- 3.5 oz has 25 mg.  Mackerel -- 1 oz has 22 mg.  Trout -- 1 oz has 20 mg.  Pork sausage  -- 1 link (1 oz) has 17 mg.  Salmon -- 1 oz has 16 mg.  Tilapia -- 1 oz has 14 mg. Dairy  Soft-serve ice cream --  cup (4 oz) has 103 mg.  Whole-milk yogurt -- 1 cup (8 oz) has 29 mg.  Cheddar cheese -- 1 oz has 28 mg.  American cheese -- 1 oz has 28 mg.  Whole milk -- 1 cup (8 oz) has 23 mg.  2% milk -- 1 cup (8 oz) has 18 mg.  Cream cheese -- 1 tablespoon (Tbsp) has 15 mg.  Cottage cheese --  cup (4 oz) has 14 mg.  Low-fat (1%) milk -- 1 cup (8 oz) has 10 mg.  Sour cream -- 1 Tbsp has 8.5 mg.  Low-fat yogurt -- 1 cup (8 oz) has 8 mg.  Nonfat Greek yogurt -- 1 cup (8 oz) has 7 mg.  Half-and-half cream -- 1 Tbsp has 5 mg. Fats and oils  Cod liver oil -- 1 tablespoon (Tbsp) has 82 mg.  Butter -- 1 Tbsp  has 15 mg.  Lard -- 1 Tbsp has 14 mg.  Bacon grease -- 1 Tbsp has 14 mg.  Mayonnaise -- 1 Tbsp has 5-10 mg.  Margarine -- 1 Tbsp has 3-10 mg. Exact amounts of cholesterol in these foods may vary depending on specific ingredients and brands. Foods without cholesterol Most plant-based foods do not have cholesterol unless you combine them with a food that has cholesterol. Foods without cholesterol include:  Grains and cereals.  Vegetables.  Fruits.  Vegetable oils, such as olive, canola, and sunflower oil.  Legumes, such as peas, beans, and lentils.  Nuts and seeds.  Egg whites. Summary  The body needs cholesterol in small amounts, but too much cholesterol can cause damage to the arteries and heart.  Most people should eat less than 200 milligrams (mg) of cholesterol a day. This information is not intended to replace advice given to you by your health care provider. Make sure you discuss any questions you have with your health care provider. Document Revised: 07/04/2017 Document Reviewed: 03/18/2017 Elsevier Patient Education  [place].

## 2020-06-23 DIAGNOSIS — H353211 Exudative age-related macular degeneration, right eye, with active choroidal neovascularization: Secondary | ICD-10-CM | POA: Diagnosis not present

## 2020-07-03 DIAGNOSIS — N200 Calculus of kidney: Secondary | ICD-10-CM | POA: Diagnosis not present

## 2020-07-03 DIAGNOSIS — N3020 Other chronic cystitis without hematuria: Secondary | ICD-10-CM | POA: Diagnosis not present

## 2020-07-03 DIAGNOSIS — N3281 Overactive bladder: Secondary | ICD-10-CM | POA: Diagnosis not present

## 2020-07-05 NOTE — Progress Notes (Signed)
HPI female never smoker followed for Restless Legs, back pain that disturbs sleep, Asthma, Chronic Resp Failure w Hypoxia, complicated by peripheral venous disease, hemochromatosis/ Fe def anemia, Walk Test O2 Qualifying 11/16/19- desat to 88% on RA with max HR 106. O2 2L raised sat to 95%.  PFT 12/30/19- Moderate Restriction, Severe Obstruction, response to BD, Mild DLCO reduction ---------------------------------------------------------------------   12/30/19-     74 year old female never smoker followed for Restless Legs, back pain that disturbs sleep, Asthma, Chronic Resp Failure w Hypoxia, complicated by peripheral venous disease, hemochromatosis/ Fe def anemia, O2 was started with hospitalization after abdominal surgeries for partial bowel obstruction, hernia repair and hepatic cyst resection May, 2019, + Glaucoma, O2 3L sleep, 2L prnl / Adapt sleep and prn   Using a self-fill system Requip 0.5 mg/ 6 daily, Neurontin 300 mg tid, clonazepam 0.5 mg 1-3 hs, neb albuterol, Singulair, Breo 100,   Furosemide 20 mg Had 2 Moderna Covax Lab- BNP was 170 H, D-dimer 0.64 H,  PFT and labs reviewed. CXR 11/16/19-  IMPRESSION: Chronic bilateral interstitial thickening. No acute cardiopulmonary disease. PFT 12/30/19- Moderate Restriction, Severe Obstruction, response to BD, Mild DLCO reduction Doppler leg veins 11/29/19- Normal  07/06/20- 74 year old female never smoker followed for Restless Legs, back pain that disturbs sleep, Asthma, Chronic Resp Failure w Hypoxia, complicated by peripheral venous disease, hemochromatosis/ Fe def anemia, O2 was started with hospitalization after abdominal surgeries for partial bowel obstruction, hernia repair and hepatic cyst resection May, 2019, + Glaucoma, O2 3L sleep, 2L prn / Adapt sleep and prn   Using a self-fill system -Requip 0.5 mg/ 6 daily, Neurontin 300 mg tid, clonazepam 0.5 mg 1-3 hs, neb albuterol, Singulair, Breo 100,   Furosemide 20 mg Covid vax-3  Moderna Flu vax-had -----6 month follow up on Asthma ACT score 17 O2 sat varies at home but usually low 90's.  [place] [facility] [place], but activity limited by low back pain/ sciatica.  ROS-see HPI + = positive Constitutional:   No-   weight loss, night sweats, fevers, chills, +fatigue, lassitude. HEENT:   No-  headaches, difficulty swallowing, tooth/dental problems, sore throat,       No-  sneezing, itching, ear ache, nasal congestion, post nasal drip,  CV:  No-   chest pain, orthopnea, PND,+ swelling in lower extremities, anasarca,                                                     dizziness, palpitations Resp: + shortness of breath with exertion or at rest.              + productive cough,  No non-productive cough,  No- coughing up of blood.              No-   change in color of mucus. + wheezing.   Skin: No-   rash or lesions. GI:  No-   heartburn, indigestion, abdominal pain, nausea, vomiting,  GU:     MS:  No-   joint pain or swelling.  No- decreased range of motion.  + back pain, leg pain. Neuro-     + per HPI Psych:  No- change in mood or affect. No depression or anxiety.  No memory loss.  OBJ- Physical Exam General- Alert, Oriented, Affect-appropriate, Distress- none acute, +obese, +POC 2L, Skin- rash-none,  lesions- none, excoriation- none Lymphadenopathy- none Head- atraumatic            Eyes- Gross vision intact, PERRLA, conjunctivae and secretions clear            Ears- Hearing, canals-normal            Nose- Clear, no-Septal dev, mucus, polyps, erosion, perforation             Throat- Mallampati II-III , mucosa clear , drainage- none, tonsils- atrophic Neck- +cervical scoliosis , trachea midline, no stridor , thyroid nl, carotid no bruit Chest - symmetrical excursion , unlabored           Heart/CV- RRR , no murmur , no gallop  , no rub, nl s1 s2                           - JVD+2cm , edema +2, stasis changes- none,  superficial varices +           Lung-  clear to P&A, wheeze- none, cough- none , dullness-none, rub- none           Chest wall- +kyphoscoliosis Abd-  Br/ Gen/ Rectal- Not done, not indicated Extrem- +compression hose legs, +heavy legs, +L calf> R. Neuro- grossly intact to observation, no unusual restlessness or movement, +cane

## 2020-07-06 ENCOUNTER — Encounter: Payer: Self-pay | Admitting: Internal Medicine

## 2020-07-06 ENCOUNTER — Ambulatory Visit (INDEPENDENT_AMBULATORY_CARE_PROVIDER_SITE_OTHER): Payer: Medicare Other | Admitting: Internal Medicine

## 2020-07-06 ENCOUNTER — Other Ambulatory Visit: Payer: Self-pay

## 2020-07-06 DIAGNOSIS — G2581 Restless legs syndrome: Secondary | ICD-10-CM

## 2020-07-06 DIAGNOSIS — R609 Edema, unspecified: Secondary | ICD-10-CM | POA: Diagnosis not present

## 2020-07-06 DIAGNOSIS — J9611 Chronic respiratory failure with hypoxia: Secondary | ICD-10-CM

## 2020-07-06 DIAGNOSIS — J4530 Mild persistent asthma, uncomplicated: Secondary | ICD-10-CM

## 2020-07-06 MED ORDER — BREO ELLIPTA 100-25 MCG/INH IN AEPB: INHALATION_SPRAY | RESPIRATORY_TRACT | 4 refills | Status: DC

## 2020-07-06 MED ORDER — BREO ELLIPTA 100-25 MCG/INH IN AEPB: INHALATION_SPRAY | RESPIRATORY_TRACT | 0 refills | Status: DC

## 2020-07-06 MED ORDER — CLONAZEPAM 0.5 MG PO TABS
ORAL_TABLET | ORAL | 1 refills | Status: DC
Start: 2020-07-06 — End: 2021-03-08

## 2020-07-06 MED ORDER — MONTELUKAST SODIUM 10 MG PO TABS: 10.0000 mg | ORAL_TABLET | Freq: Every evening | ORAL | 3 refills | Status: AC

## 2020-07-06 MED ORDER — GABAPENTIN 300 MG PO CAPS: 300.0000 mg | ORAL_CAPSULE | Freq: Three times a day (TID) | ORAL | 3 refills | Status: AC

## 2020-07-06 MED ORDER — ALBUTEROL SULFATE (2.5 MG/3ML) 0.083% IN NEBU: INHALATION_SOLUTION | RESPIRATORY_TRACT | 3 refills | Status: DC

## 2020-07-06 MED ORDER — ALBUTEROL SULFATE HFA 108 (90 BASE) MCG/ACT IN AERS: INHALATION_SPRAY | RESPIRATORY_TRACT | 3 refills | Status: AC

## 2020-07-06 MED ORDER — ROPINIROLE HCL 0.5 MG PO TABS: ORAL_TABLET | ORAL | 3 refills | Status: AC

## 2020-07-06 NOTE — Patient Instructions (Signed)
We can continue oxygen   We can continue current meds  Please call if we can help

## 2020-07-24 ENCOUNTER — Ambulatory Visit (INDEPENDENT_AMBULATORY_CARE_PROVIDER_SITE_OTHER): Payer: Medicare Other

## 2020-07-24 ENCOUNTER — Encounter: Payer: Self-pay | Admitting: Internal Medicine

## 2020-07-24 ENCOUNTER — Ambulatory Visit (INDEPENDENT_AMBULATORY_CARE_PROVIDER_SITE_OTHER): Payer: Medicare Other | Admitting: Internal Medicine

## 2020-07-24 ENCOUNTER — Other Ambulatory Visit: Payer: Self-pay

## 2020-07-24 VITALS — BP 120/70 | HR 68 | Temp 98.0°F | Ht 59.0 in | Wt 226.0 lb

## 2020-07-24 VITALS — BP 120/78 | HR 68 | Temp 98.0°F | Ht 59.0 in | Wt 226.0 lb

## 2020-07-24 DIAGNOSIS — E559 Vitamin D deficiency, unspecified: Secondary | ICD-10-CM

## 2020-07-24 DIAGNOSIS — I10 Essential (primary) hypertension: Secondary | ICD-10-CM

## 2020-07-24 DIAGNOSIS — F09 Unspecified mental disorder due to known physiological condition: Secondary | ICD-10-CM | POA: Diagnosis not present

## 2020-07-24 DIAGNOSIS — Z0000 Encounter for general adult medical examination without abnormal findings: Secondary | ICD-10-CM | POA: Diagnosis not present

## 2020-07-24 DIAGNOSIS — R739 Hyperglycemia, unspecified: Secondary | ICD-10-CM | POA: Diagnosis not present

## 2020-07-24 DIAGNOSIS — D649 Anemia, unspecified: Secondary | ICD-10-CM | POA: Diagnosis not present

## 2020-07-24 DIAGNOSIS — E538 Deficiency of other specified B group vitamins: Secondary | ICD-10-CM

## 2020-07-24 DIAGNOSIS — E7800 Pure hypercholesterolemia, unspecified: Secondary | ICD-10-CM | POA: Diagnosis not present

## 2020-07-24 LAB — TSH: TSH: 1.18 u[IU]/mL (ref 0.35–4.50)

## 2020-07-24 LAB — CBC
HCT: 41 % (ref 36.0–46.0)
Hemoglobin: 13.4 g/dL (ref 12.0–15.0)
MCHC: 32.7 g/dL (ref 30.0–36.0)
MCV: 94.7 fl (ref 78.0–100.0)
Platelets: 216 10*3/uL (ref 150.0–400.0)
RBC: 4.33 Mil/uL (ref 3.87–5.11)
RDW: 13.4 % (ref 11.5–15.5)
WBC: 5.8 10*3/uL (ref 4.0–10.5)

## 2020-07-24 LAB — COMPREHENSIVE METABOLIC PANEL
ALT: 19 U/L (ref 0–35)
AST: 18 U/L (ref 0–37)
Albumin: 3.8 g/dL (ref 3.5–5.2)
Alkaline Phosphatase: 83 U/L (ref 39–117)
BUN: 21 mg/dL (ref 6–23)
CO2: 34 mEq/L — ABNORMAL HIGH (ref 19–32)
Calcium: 8.9 mg/dL (ref 8.4–10.5)
Chloride: 104 mEq/L (ref 96–112)
Creatinine, Ser: 0.43 mg/dL (ref 0.40–1.20)
GFR: 96.72 mL/min (ref >60.00)
Glucose, Bld: 87 mg/dL (ref 70–99)
Potassium: 3.6 mEq/L (ref 3.5–5.1)
Sodium: 143 mEq/L (ref 135–145)
Total Bilirubin: 0.5 mg/dL (ref 0.2–1.2)
Total Protein: 6.6 g/dL (ref 6.0–8.3)

## 2020-07-24 LAB — LIPID PANEL
Cholesterol: 192 mg/dL (ref 0–200)
HDL: 36.1 mg/dL — ABNORMAL LOW (ref >39.00)
LDL Cholesterol: 119 mg/dL — ABNORMAL HIGH (ref 0–99)
NonHDL: 155.75
Total CHOL/HDL Ratio: 5
Triglycerides: 185 mg/dL — ABNORMAL HIGH (ref 0.0–149.0)
VLDL: 37 mg/dL (ref 0.0–40.0)

## 2020-07-24 LAB — HEMOGLOBIN A1C: Hgb A1c MFr Bld: 5.9 % (ref 4.6–6.5)

## 2020-07-24 LAB — VITAMIN D 25 HYDROXY (VIT D DEFICIENCY, FRACTURES): VITD: 27.55 ng/mL — ABNORMAL LOW (ref 30.00–100.00)

## 2020-07-24 LAB — VITAMIN B12: Vitamin B-12: 1009 pg/mL — ABNORMAL HIGH (ref 211–911)

## 2020-07-24 MED ORDER — DONEPEZIL HCL 5 MG PO TABS: 5.0000 mg | ORAL_TABLET | Freq: Every day | ORAL | 3 refills | Status: DC

## 2020-07-24 NOTE — Assessment & Plan Note (Signed)
Rx aricept 5 mg daily, checking CBC and TSH and B12 and vitamin D.

## 2020-07-24 NOTE — Assessment & Plan Note (Signed)
Weight stable, advised diet changes. Unable to exercise currently.

## 2020-07-24 NOTE — Progress Notes (Signed)
Subjective:   Lisa Hamilton is a 74 y.o. female who presents for Medicare Annual (Subsequent) preventive examination.  Review of Systems    No ROS. Medicare Wellness Visit. Additional risk factors are reflected in social history. Cardiac Risk Factors include: advanced age (>33men, >68 women);dyslipidemia;hypertension;obesity (BMI >30kg/m2)     Objective:    Today's Vitals   07/24/20 0929  BP: 120/78  Pulse: 68  Temp: 98 F (36.7 C)  SpO2: 94%  Weight: 226 lb (102.5 kg)  Height: 4\' 11"  (1.499 m)  PainSc: 0-No pain   Body mass index is 45.65 kg/m.  Advanced Directives 07/24/2020 07/23/2019 09/10/2018 07/07/2018 12/05/2017 11/28/2017 10/15/2017  Does Patient Have a Medical Advance Directive? Yes Yes Yes Yes Yes Yes No  Type of Advance Directive - [place];Living will [place];Living will [place];Living will [place];Living will [place];Living will -  Does patient want to make changes to medical advance directive? No - Patient declined - - - No - Patient declined - -  Copy of [place] in Chart? - No - copy requested No - copy requested No - copy requested - No - copy requested -  Would patient like information on creating a medical advance directive? - - - - - - No - Patient declined  Pre-existing out of facility DNR order (yellow form or pink MOST form) - - - - - - -    Current Medications (verified) Outpatient Encounter Medications as of 07/24/2020  Medication Sig  . acetaminophen (TYLENOL) 500 MG tablet Take 1,000 mg by mouth 3 (three) times daily as needed for moderate pain or headache.  . albuterol (PROVENTIL) (2.5 MG/3ML) 0.083% nebulizer solution USE 1 VIAL IN NEBULIZER 4 TIMES DAILY. Generic: VENTOLIN  . albuterol (VENTOLIN HFA) 108 (90 Base) MCG/ACT inhaler USE 1 TO 2 INHALATIONS BY  MOUTH EVERY 6 HOURS AS  NEEDED FOR WHEEZING OR  SHORTNESS OF BREATH  .  aspirin EC 81 MG tablet Take 162 mg by mouth at bedtime.  . Calcium Carb-Cholecalciferol (CALCIUM 600+D) 600-800 MG-UNIT TABS Take 1 tablet by mouth 2 (two) times daily.  . cephALEXin (KEFLEX) 250 MG capsule Take 250 mg by mouth daily.  . Cholecalciferol (VITAMIN D) 2000 units tablet Take 2,000 Units by mouth daily.  . clonazePAM (KLONOPIN) 0.5 MG tablet TAKE 1 TO 3 TABLETS BY  MOUTH 30 MINUTES BEFORE  BEDTIME  . Co-Enzyme Q-10 100 MG CAPS Take 100 mg by mouth daily.   . Cranberry 180 MG CAPS Take 1 capsule by mouth daily.  . diclofenac Sodium (VOLTAREN) 1 % GEL   . fluticasone (FLONASE) 50 MCG/ACT nasal spray Place 2 sprays into both nostrils daily.  . fluticasone furoate-vilanterol (BREO ELLIPTA) 100-25 MCG/INH AEPB Inhale 1 puff then rinse mouth,once daily  . furosemide (LASIX) 20 MG tablet Take 1 tablet (20 mg total) by mouth daily. (Patient taking differently: Take 20 mg by mouth daily as needed. )  . gabapentin (NEURONTIN) 300 MG capsule Take 1 capsule (300 mg total) by mouth 3 (three) times daily.  Marland Kitchen guaiFENesin (MUCINEX) 600 MG 12 hr tablet Take 600 mg by mouth 2 (two) times daily as needed (congestion).   Marland Kitchen ibuprofen (ADVIL,MOTRIN) 200 MG tablet Take 400 mg by mouth 3 (three) times daily as needed for headache or moderate pain.  Marland Kitchen losartan (COZAAR) 50 MG tablet TAKE 1 TABLET BY MOUTH  DAILY  . Magnesium 400 MG  CAPS Take 400 mg by mouth 2 (two) times daily.   . montelukast (SINGULAIR) 10 MG tablet Take 1 tablet (10 mg total) by mouth every evening.  . Multiple Vitamins-Minerals (PRESERVISION AREDS 2) CAPS Take 1 capsule by mouth 2 (two) times daily.  . Omega-3 Fatty Acids (FISH OIL) 1200 MG CAPS Take 1,200 mg by mouth daily.  . OXYGEN Inhale 2 L into the lungs at bedtime.   . polyethylene glycol (MIRALAX / GLYCOLAX) packet Take 17 g by mouth 3 (three) times daily as needed for moderate constipation.  . pravastatin (PRAVACHOL) 40 MG tablet TAKE 1 TABLET BY MOUTH  DAILY  . Propylene  Glycol (SYSTANE BALANCE OP) Place 1 drop into both eyes daily as needed (dry eyes).  Marland Kitchen rOPINIRole (REQUIP) 0.5 MG tablet Take up to 6 tablets by mouth daily as needed  . traMADol (ULTRAM) 50 MG tablet Take 1 tablet (50 mg total) by mouth daily as needed.  . vitamin B-12 (CYANOCOBALAMIN) 1000 MCG tablet Take 1,000 mcg by mouth daily.   No facility-administered encounter medications on file as of 07/24/2020.    Allergies (verified) Hydromorphone hcl, Penicillins, Adhesive [tape], Amoxicillin-pot clavulanate, and Codeine   History: Past Medical History:  Diagnosis Date  . Allergy   . Anemia   . Asthmatic bronchitis   . Blood transfusion without reported diagnosis 12/2017  . Cataract   . Constipation    chronic  . COPD (chronic obstructive pulmonary disease) ([place])   . Diverticulosis of colon   . DJD (degenerative joint disease)   . GERD (gastroesophageal reflux disease)    rare  . Glaucoma 1977   EYE SURGERY  FOR TX OF GLAUCOMA--NO LONGER HAS GLAUCOMA  . Hepatic cyst    s/p surgical intervention 05/2011 -[place]  . Hiatal hernia   . Hx of colonic polyps   . Hyperlipidemia   . Hypertension   . Kidney stone   . Lipoma   . Medullary sponge kidney   . Nontoxic multinodular goiter    pt unsure of this/ per pt no one told her!  . Obesity   . Osteoporosis   . Oxygen deficiency   . Partial small bowel obstruction ([place]) 10/15/2017  . Renal calculus    s/p lithotripsy 06/2011  . Restless leg syndrome   . Shingles 2009  . Squamous cell carcinoma of wrist, right    scalp, right wrist  . Venous insufficiency   . Vitamin D deficiency    Past Surgical History:  Procedure Laterality Date  . bilateral carpel tunnel repairs     Dr. Shellia Carwin  left--1999   right--2008  . bilateral wrist fracture, right  1996   left 2001, Dr. Daylene Katayama  . CATARACT EXTRACTION  04/07/13 R, 05/18/13 L   [facility]  . COLONOSCOPY    . COLONOSCOPY WITH PROPOFOL N/A 09/10/2018   Procedure: COLONOSCOPY WITH  PROPOFOL;  Surgeon: Milus Banister, MD;  Location: [facility];  Service: Endoscopy;  Laterality: N/A;  . eye surgery Left 11/22/2014   "MD pucker" repair  . FOOT SURGERY     left foot surgery/ cut off part of the bone due to cleft foot  . [place]  . [place]  . HERNIA REPAIR  2012   umb hernia/ pt states it was done in 10/2017 again  . INSERTION OF MESH N/A 10/15/2017   Procedure: INSERTION OF MESH;  Surgeon: Donnie Mesa, MD;  Location: [facility];  Service: General;  Laterality: N/A;  .  LAPAROSCOPIC LIVER CYST FENESTRATION  05/2011  . LAPAROSCOPIC PARTIAL HEPATECTOMY N/A 12/04/2017   Procedure: LAPAROSCOPIC PARTIAL HEPATECTOMY;  Surgeon: [name], MD;  Location: [facility];  Service: General;  Laterality: N/A;  . LIPOMA EXCISION  08/13/2012   Procedure: EXCISION LIPOMA;  Surgeon: Magnus Sinning, MD;  Location: [facility];  Service: Orthopedics;  Laterality: Left;  . LITHOTRIPSY  06/16/2018  . POLYPECTOMY  09/10/2018   Procedure: POLYPECTOMY;  Surgeon: Milus Banister, MD;  Location: [facility];  Service: Endoscopy;;  . SHOULDER OPEN ROTATOR CUFF REPAIR  08/13/2012   Procedure: ROTATOR CUFF REPAIR SHOULDER OPEN;  Surgeon: Magnus Sinning, MD;  Location: [facility];  Service: Orthopedics;  Laterality: Left;  LEFT SHOULDER ANTERIOR ACROMINECTOMY AND ROTATOR CUFF REPAIR AND EXCISION LEFT SHOULDER LIPOMA  . SQUAMOUS CELL CARCINOMA EXCISION  2010   L [place]  . TONSILLECTOMY  1967  . VENTRAL HERNIA REPAIR N/A 10/15/2017   Procedure: HERNIA REPAIR VENTRAL ADULT;  Surgeon: Donnie Mesa, MD;  Location: [facility] OR;  Service: General;  Laterality: N/A;   Family History  Problem Relation Age of Onset  . Lung cancer Father   . Colon cancer Father        possible colon cancer, but unsure  . Lung cancer Sister   . Irritable bowel syndrome Sister   . Obstructive Sleep Apnea Sister   . Osteoarthritis Sister   . Esophageal cancer Neg Hx   . Rectal cancer Neg Hx   . Stomach cancer  Neg Hx    Social History   Socioeconomic History  . Marital status: Married    Spouse name: Fritz Pickerel  . Number of children: 2  . Years of education: Not on file  . Highest education level: Not on file  Occupational History  . 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 Smoking status: Never Smoker  . Smokeless tobacco: Never Used  Vaping Use  . Vaping Use: Never used  Substance and Sexual Activity  . Alcohol use: Yes    Alcohol/week: 0.0 standard drinks    Comment: social use  . Drug use: No  . Sexual activity: Never  Other Topics Concern  . Not on file  Social History Narrative  . Not on file   Social Determinants of Health   Financial Resource Strain: Low Risk   . Difficulty of Paying Living Expenses: Not very hard  Food Insecurity: No Food Insecurity  . Worried About Charity fundraiser in the Last Year: Never true  . Ran Out of Food in the Last Year: Never true  Transportation Needs: No Transportation Needs  . Lack of Transportation (Medical): No  . Lack of Transportation (Non-Medical): No  Physical Activity: Inactive  . Days of Exercise per Week: 0 days  . Minutes of Exercise per Session: 0 min  Stress: No Stress Concern Present  . Feeling of Stress : Not at all  Social Connections: Socially Integrated  . Frequency of Communication with Friends and Family: More than three times a week  . Frequency of Social Gatherings with Friends and Family: More than three times a week  . Attends Religious Services: More than 4 times per year  . Active Member of Clubs or Organizations: Yes  . Attends Archivist Meetings: More than 4 times per year  . Marital Status: Married    Tobacco Counseling Counseling given: Not Answered   Clinical Intake:  Pre-visit preparation completed: Yes  Pain :  No/denies pain Pain Score: 0-No pain     BMI - recorded: 45.65 Nutritional Status: BMI <19  Underweight Nutritional Risks:  None Diabetes: No  How often do you need to have someone help you when you read instructions, pamphlets, or other written materials from your doctor or pharmacy?: 1 - Never What is the last grade level you completed in school?: Associates degree  Diabetic? No  Interpreter Needed?: No  Information entered by :: Lisette Abu, LPN   Activities of Daily Living In your present state of health, do you have any difficulty performing the following activities: 07/24/2020  Hearing? N  Vision? N  Difficulty concentrating or making decisions? N  Walking or climbing stairs? Y  Dressing or bathing? N  Doing errands, shopping? N  Preparing Food and eating ? N  Using the Toilet? N  In the past six months, have you accidently leaked urine? N  Do you have problems with loss of bowel control? N  Managing your Medications? N  Managing your Finances? N  Housekeeping or managing your Housekeeping? N  Some recent data might be hidden    Patient Care Team: Hoyt Koch, MD as PCP - General (Internal Medicine) Irine Seal, MD (Urology) Rolm Bookbinder, MD (Dermatology) Rutherford Guys, MD (Ophthalmology) [place], Romilda Garret, [place] (Podiatry) Deneise Lever, MD (Pulmonary Disease) Milus Banister, MD (Gastroenterology) Jerelyn Charles, MD as Consulting Physician (Obstetrics) Edrick Kins, DPM as Consulting Physician (Podiatry) Jerelyn Charles, MD as Consulting Physician (Obstetrics) Suella Broad, MD as Consulting Physician (Physical Medicine and Rehabilitation) Charlton Haws, [facility] as Pharmacist (Pharmacist)  Indicate any recent Medical Services you may have received from other than [facility] providers in the past year (date may be approximate).     Assessment:   This is a routine wellness examination for [place].  Hearing/Vision screen No exam data present  Dietary issues and exercise activities discussed: Current Exercise Habits: The patient does not participate in regular exercise at  present, Exercise limited by: orthopedic condition(s);respiratory conditions(s)  Goals    .  <enter goal here> (pt-stated)      Get my legs in shape to be able to move without limitation. Seeing vascular/vein specialist soon for evaluation.     .  Patient Stated      I want to continue to lose weight and stay on the next 56 days diet until I reach my weight goal, I will continue to do water aerobics.     .  Patient Stated      I will continue to do massage therapy, will do chair exercises, noom diet and make it part of my lifestyle. Love and visit my grandchildren and family.    .  Patient Stated (pt-stated)      My goal is to lose weight and increase my mobility.    .  Pharmacy Care Plan      CARE PLAN ENTRY (see longitudinal plan of care for additional care plan information)  Current Barriers:  . Chronic Disease Management support, education, and care coordination needs related to Hypertension, Hyperlipidemia, and Asthma   Hypertension BP Readings from Last 3 Encounters:  12/30/19 130/72  11/16/19 138/74  07/23/19 136/84 .  Pharmacist Clinical Goal(s): o Over the next 60 days, patient will work with PharmD and providers to achieve BP goal <130/80 . Current regimen:  o Losartan 50 mg daily o Furosemide 20 mg daily . Interventions: o Discussed BP goals and benefits of medications for prevention of heart attack /  stroke . Patient self care activities - Over the next 60 days, patient will: o Check BP daily, document, and provide at future appointments o Ensure daily salt intake < 2300 mg/day  Hyperlipidemia Lab Results  Component Value Date/Time   LDLCALC 148 (H) 01/08/2019 09:24 AM .  Pharmacist Clinical Goal(s): o Over the next 60 days, patient will work with PharmD and providers to achieve LDL goal < 100 . Current regimen:  o Pravastatin 40 mg daily o Coenzyme Q10 100 mg daily o Aspirin 81 mg daily o Omega-3 FA Fish Oil 1000 mg daily . Interventions: o Discussed  cholesterol goals and benefits of medications for prevention of heart attack / stroke. May consider switch to rosuvastatin if LDL above goal at next chekc . Patient self care activities - Over the next 60 days, patient will: o Continue medications as precribed o Follow up with PCP as scheduled  Asthma . Pharmacist Clinical Goal(s) o Over the next 60 days, patient will work with PharmD and providers to optmze therapy . Current regimen:  o Albuterol 0.083% nebulizer soln twice daily as needed o Albuterol HFA as needed o Breo Ellipta 100-25 mcg/inh 1 puff daily o Montelukast 10 mg daily at bedtime . Interventions: o Discussed benefits of inhalers and proper inhaler technique o Patient's income is too high for patient assistance . Patient self care activities - Over the next 60 days, patient will: o Continue current medications  Medication management . Pharmacist Clinical Goal(s): o Over the next 60 days, patient will work with PharmD and providers to maintain optimal medication adherence . Current pharmacy: [facility] . Interventions o Comprehensive medication review performed. o Continue current medication management strategy . Patient self care activities - Over the next 60 days, patient will: o Focus on medication adherence by pill box o Take medications as prescribed o Report any questions or concerns to PharmD and/or provider(s)  Initial goal documentation      Depression Screen PHQ 2/9 Scores 07/24/2020 07/23/2019 01/08/2019 07/07/2018 07/01/2017 06/25/2016 08/30/2015  PHQ - 2 Score 0 1 1 0 0 0 0  PHQ- 9 Score - 4 - 3 3 - -    Fall Risk Fall Risk  07/24/2020 07/23/2019 01/08/2019 07/07/2018 06/23/2018  Falls in the past year? 0 0 0 0 0  Comment - - - - [place] Telephone Survey: data to providers prior to load  Number falls in past yr: 0 0 0 - -  Injury with Fall? 0 0 - - -  Risk for fall due to : No Fall Risks Impaired balance/gait;Impaired mobility - Impaired  balance/gait;Impaired mobility -  Follow up Falls evaluation completed;Education provided - - - -    FALL RISK PREVENTION PERTAINING TO THE HOME:  Any stairs in or around the home? Yes  If so, are there any without handrails? No  Home free of loose throw rugs in walkways, pet beds, electrical cords, etc? Yes  Adequate lighting in your home to reduce risk of falls? Yes   ASSISTIVE DEVICES UTILIZED TO PREVENT FALLS:  Life alert? No  Use of a cane, walker or w/c? Yes  Grab bars in the bathroom? Yes  Shower chair or bench in shower? Yes  Elevated toilet seat or a handicapped toilet? Yes   TIMED UP AND GO:  Was the test performed? No .  Length of time to ambulate 10 feet: 0 sec.   Gait slow and steady with assistive device  Cognitive Function: MMSE - Mini Mental  State Exam 07/01/2017 06/25/2016  Orientation to time 5 5  Orientation to Place 5 5  Registration 3 3  Attention/ Calculation 5 5  Recall 2 3  Language- name 2 objects 2 2  Language- repeat 1 1  Language- follow 3 step command 3 3  Language- read & follow direction 1 1  Write a sentence 1 1  Copy design 1 1  Total score 29 30        Immunizations Immunization History  Administered Date(s) Administered  . H1N1 07/27/2008  . Influenza Split 05/16/2011  . Influenza, High Dose Seasonal PF 06/13/2015, 05/11/2016, 05/06/2018, 05/11/2019  . Influenza,inj,Quad PF,6+ Mos 06/02/2013, 06/07/2014  . Influenza-Unspecified 06/13/2015, 05/05/2017, 05/06/2018, 05/18/2020  . Moderna Sars-Covid-2 Vaccination 08/30/2019, 09/27/2019, 05/27/2020  . Pneumococcal Conjugate-13 01/04/2014  . Pneumococcal Polysaccharide-23 05/15/2012, 07/01/2017  . Td 09/26/2009  . Tdap 06/07/2014  . Zoster 01/09/2013  . Zoster Recombinat (Shingrix) 09/03/2017, 01/09/2018    TDAP status: Up to date  Flu Vaccine status: Up to date  Pneumococcal vaccine status: Due, Education has been provided regarding the importance of this vaccine. Advised  may receive this vaccine at local pharmacy or Health Dept. Aware to provide a copy of the vaccination record if obtained from local pharmacy or Health Dept. Verbalized acceptance and understanding.  Covid-19 vaccine status: Completed vaccines  Qualifies for Shingles Vaccine? Yes   Zostavax completed Yes   Shingrix Completed?: No.    Education has been provided regarding the importance of this vaccine. Patient has been advised to call insurance company to determine out of pocket expense if they have not yet received this vaccine. Advised may also receive vaccine at local pharmacy or Health Dept. Verbalized acceptance and understanding.  Screening Tests Health Maintenance  Topic Date Due  . MAMMOGRAM  07/30/2020  . COLONOSCOPY  09/10/2021  . TETANUS/TDAP  06/07/2024  . INFLUENZA VACCINE  Completed  . DEXA SCAN  Completed  . COVID-19 Vaccine  Completed  . Hepatitis C Screening  Completed  . PNA vac Low Risk Adult  Completed    Health Maintenance  There are no preventive care reminders to display for this patient.  Colorectal cancer screening: Type of screening: Colonoscopy. Completed 09/10/2018. Repeat every 3 years  Mammogram status: Completed 08/05/2019. Repeat every year  Bone Density status: Completed 10/05/2019. Results reflect: Bone density results: OSTEOPOROSIS. Repeat every 3 years.  Lung Cancer Screening: (Low Dose CT Chest recommended if Age 61-80 years, 30 pack-year currently smoking OR have quit w/in 15years.) does not qualify.   Lung Cancer Screening Referral: no  Additional Screening:  Hepatitis C Screening: does qualify; Completed yes  Vision Screening: Recommended annual ophthalmology exams for early detection of glaucoma and other disorders of the eye. Is the patient up to date with their annual eye exam?  Yes  Who is the provider or what is the name of the office in which the patient attends annual eye exams? Mellody Life, MD If pt is not established with a  provider, would they like to be referred to a provider to establish care? No .   Dental Screening: Recommended annual dental exams for proper oral hygiene  Community Resource Referral / Chronic Care Management: CRR required this visit?  No   CCM required this visit?  No      Plan:     I have personally reviewed and noted the following in the patient's chart:   . Medical and social history . Use of alcohol, tobacco or illicit drugs  .  Current medications and supplements . Functional ability and status . Nutritional status . Physical activity . Advanced directives . List of other physicians . Hospitalizations, surgeries, and ER visits in previous 12 months . Vitals . Screenings to include cognitive, depression, and falls . Referrals and appointments  In addition, I have reviewed and discussed with patient certain preventive protocols, quality metrics, and best practice recommendations. A written personalized care plan for preventive services as well as general preventive health recommendations were provided to patient.     Sheral Flow, LPN   [id number]   Nurse Notes: n/a

## 2020-07-24 NOTE — Assessment & Plan Note (Signed)
Checking lipid panel and adjust pravastatin 40 mg daily as needed.  

## 2020-07-24 NOTE — Assessment & Plan Note (Signed)
BP at goal on lasix and losartan. Checking CMP and adjust as needed.

## 2020-07-24 NOTE — Patient Instructions (Addendum)
Ms. Lisa Hamilton , Thank you for taking time to come for your Medicare Wellness Visit. I appreciate your ongoing commitment to your health goals. Please review the following plan we discussed and let me know if I can assist you in the future.   Screening recommendations/referrals: Colonoscopy: 09/10/2018; due every 3 years Mammogram: 08/05/2019; due every year Bone Density: 10/05/2019; due every 2-3 years  Recommended yearly ophthalmology/optometry visit for glaucoma screening and checkup Recommended yearly dental visit for hygiene and checkup  Vaccinations: Influenza vaccine: 05/18/2020 Pneumococcal vaccine: up to date Tdap vaccine: 06/07/2014; due every 10 years Shingles vaccine: up to date   Covid-19: up to date  Advanced directives: Please bring a copy of your health care power of attorney and living will to the office at your convenience.  Conditions/risks identified: Yes; Reviewed health maintenance screenings with patient today and relevant education, vaccines, and/or referrals were provided. Please continue to do your personal lifestyle choices by: daily care of teeth and gums, regular physical activity (goal should be 5 days a week for 30 minutes), eat a healthy diet, avoid tobacco and drug use, limiting any alcohol intake, taking a low-dose aspirin (if not allergic or have been advised by your provider otherwise) and taking vitamins and minerals as recommended by your provider. Continue doing brain stimulating activities (puzzles, reading, adult coloring books, staying active) to keep memory sharp. Continue to eat heart healthy diet (full of fruits, vegetables, whole grains, lean protein, water--limit salt, fat, and sugar intake) and increase physical activity as tolerated.  Next appointment: Please schedule your next Medicare Wellness Visit with your Nurse Health Advisor in 1 year by calling [phone].   Preventive Care 73 Years and Older, Female Preventive care refers to lifestyle choices  and visits with your health care provider that can promote health and wellness. What does preventive care include?  A yearly physical exam. This is also called an annual well check.  Dental exams once or twice a year.  Routine eye exams. Ask your health care provider how often you should have your eyes checked.  Personal lifestyle choices, including:  Daily care of your teeth and gums.  Regular physical activity.  Eating a healthy diet.  Avoiding tobacco and drug use.  Limiting alcohol use.  Practicing safe sex.  Taking low-dose aspirin every day.  Taking vitamin and mineral supplements as recommended by your health care provider. What happens during an annual well check? The services and screenings done by your health care provider during your annual well check will depend on your age, overall health, lifestyle risk factors, and family history of disease. Counseling  Your health care provider may ask you questions about your:  Alcohol use.  Tobacco use.  Drug use.  Emotional well-being.  Home and relationship well-being.  Sexual activity.  Eating habits.  History of falls.  Memory and ability to understand (cognition).  Work and work Statistician.  Reproductive health. Screening  You may have the following tests or measurements:  Height, weight, and BMI.  Blood pressure.  Lipid and cholesterol levels. These may be checked every 5 years, or more frequently if you are over 23 years old.  Skin check.  Lung cancer screening. You may have this screening every year starting at age 40 if you have a 30-pack-year history of smoking and currently smoke or have quit within the past 15 years.  Fecal occult blood test (FOBT) of the stool. You may have this test every year starting at age 73.  Flexible sigmoidoscopy  or colonoscopy. You may have a sigmoidoscopy every 5 years or a colonoscopy every 10 years starting at age 73.  Hepatitis C blood test.  Hepatitis  B blood test.  Sexually transmitted disease (STD) testing.  Diabetes screening. This is done by checking your blood sugar (glucose) after you have not eaten for a while (fasting). You may have this done every 1-3 years.  Bone density scan. This is done to screen for osteoporosis. You may have this done starting at age 73.  Mammogram. This may be done every 1-2 years. Talk to your health care provider about how often you should have regular mammograms. Talk with your health care provider about your test results, treatment options, and if necessary, the need for more tests. Vaccines  Your health care provider may recommend certain vaccines, such as:  Influenza vaccine. This is recommended every year.  Tetanus, diphtheria, and acellular pertussis (Tdap, Td) vaccine. You may need a Td booster every 10 years.  Zoster vaccine. You may need this after age 16.  Pneumococcal 13-valent conjugate (PCV13) vaccine. One dose is recommended after age 73.  Pneumococcal polysaccharide (PPSV23) vaccine. One dose is recommended after age 73. Talk to your health care provider about which screenings and vaccines you need and how often you need them. This information is not intended to replace advice given to you by your health care provider. Make sure you discuss any questions you have with your health care provider. Document Released: 08/18/2015 Document Revised: 04/10/2016 Document Reviewed: 05/23/2015 Elsevier Interactive Patient Education  2017 [employer] Prevention in the Home Falls can cause injuries. They can happen to people of all ages. There are many things you can do to make your home safe and to help prevent falls. What can I do on the outside of my home?  Regularly fix the edges of walkways and driveways and fix any cracks.  Remove anything that might make you trip as you walk through a door, such as a raised step or threshold.  Trim any bushes or trees on the path to your  home.  Use bright outdoor lighting.  Clear any walking paths of anything that might make someone trip, such as rocks or tools.  Regularly check to see if handrails are loose or broken. Make sure that both sides of any steps have handrails.  Any raised decks and porches should have guardrails on the edges.  Have any leaves, snow, or ice cleared regularly.  Use sand or salt on walking paths during winter.  Clean up any spills in your garage right away. This includes oil or grease spills. What can I do in the bathroom?  Use night lights.  Install grab bars by the toilet and in the tub and shower. Do not use towel bars as grab bars.  Use non-skid mats or decals in the tub or shower.  If you need to sit down in the shower, use a plastic, non-slip stool.  Keep the floor dry. Clean up any water that spills on the floor as soon as it happens.  Remove soap buildup in the tub or shower regularly.  Attach bath mats securely with double-sided non-slip rug tape.  Do not have throw rugs and other things on the floor that can make you trip. What can I do in the bedroom?  Use night lights.  Make sure that you have a light by your bed that is easy to reach.  Do not use any sheets or blankets that are  too big for your bed. They should not hang down onto the floor.  Have a firm chair that has side arms. You can use this for support while you get dressed.  Do not have throw rugs and other things on the floor that can make you trip. What can I do in the kitchen?  Clean up any spills right away.  Avoid walking on wet floors.  Keep items that you use a lot in easy-to-reach places.  If you need to reach something above you, use a strong step stool that has a grab bar.  Keep electrical cords out of the way.  Do not use floor polish or wax that makes floors slippery. If you must use wax, use non-skid floor wax.  Do not have throw rugs and other things on the floor that can make you  trip. What can I do with my stairs?  Do not leave any items on the stairs.  Make sure that there are handrails on both sides of the stairs and use them. Fix handrails that are broken or loose. Make sure that handrails are as long as the stairways.  Check any carpeting to make sure that it is firmly attached to the stairs. Fix any carpet that is loose or worn.  Avoid having throw rugs at the top or bottom of the stairs. If you do have throw rugs, attach them to the floor with carpet tape.  Make sure that you have a light switch at the top of the stairs and the bottom of the stairs. If you do not have them, ask someone to add them for you. What else can I do to help prevent falls?  Wear shoes that:  Do not have high heels.  Have rubber bottoms.  Are comfortable and fit you well.  Are closed at the toe. Do not wear sandals.  If you use a stepladder:  Make sure that it is fully opened. Do not climb a closed stepladder.  Make sure that both sides of the stepladder are locked into place.  Ask someone to hold it for you, if possible.  Clearly mark and make sure that you can see:  Any grab bars or handrails.  First and last steps.  Where the edge of each step is.  Use tools that help you move around (mobility aids) if they are needed. These include:  Canes.  Walkers.  Scooters.  Crutches.  Turn on the lights when you go into a dark area. Replace any light bulbs as soon as they burn out.  Set up your furniture so you have a clear path. Avoid moving your furniture around.  If any of your floors are uneven, fix them.  If there are any pets around you, be aware of where they are.  Review your medicines with your doctor. Some medicines can make you feel dizzy. This can increase your chance of falling. Ask your doctor what other things that you can do to help prevent falls. This information is not intended to replace advice given to you by your health care provider. Make  sure you discuss any questions you have with your health care provider. Document Released: 05/18/2009 Document Revised: 12/28/2015 Document Reviewed: 08/26/2014 Elsevier Interactive Patient Education  2017 [employer].

## 2020-07-24 NOTE — Progress Notes (Signed)
   Subjective:   Patient ID: Lisa Hamilton, female    DOB: [birth year], 74 y.o.   MRN: [id number]  HPI The patient is a 74 YO female coming in for follow up blood pressure (taking losartan and lasix and denies chest pains or headaches, denies side effects or missing doses) and morbid obesity (weight stable, unable to exercise due to mobility, understands diet changes) and hyperglycemia (sugars in the pre-diabetes range, working on diet, exercise is limited by mobility), and memory (feels she is having some more difficulty with word finding, would like to be aggressive preventatively if possible, denies problems with late bills, getting lost driving, can usually find the word but it takes longer than previous).   Review of Systems  Constitutional: Negative.   HENT: Negative.   Eyes: Negative.   Respiratory: Negative for cough, chest tightness and shortness of breath.   Cardiovascular: Negative for chest pain, palpitations and leg swelling.  Gastrointestinal: Negative for abdominal distention, abdominal pain, constipation, diarrhea, nausea and vomiting.  Musculoskeletal: Positive for arthralgias, gait problem and myalgias.  Skin: Negative.   Psychiatric/Behavioral: Negative.     Objective:  Physical Exam Constitutional:      Appearance: She is well-developed and well-nourished. She is obese.  HENT:     Head: Normocephalic and atraumatic.  Eyes:     Extraocular Movements: EOM normal.  Cardiovascular:     Rate and Rhythm: Normal rate and regular rhythm.  Pulmonary:     Effort: Pulmonary effort is normal. No respiratory distress.     Breath sounds: Normal breath sounds. No wheezing or rales.  Abdominal:     General: Bowel sounds are normal. There is no distension.     Palpations: Abdomen is soft.     Tenderness: There is no abdominal tenderness. There is no rebound.  Musculoskeletal:        General: No edema.     Cervical back: Normal range of motion.  Skin:    General: Skin is warm  and dry.  Neurological:     Mental Status: She is alert and oriented to person, place, and time.     Coordination: Coordination abnormal.     Comments: Cane for mobility, slow gait  Psychiatric:        Mood and Affect: Mood and affect normal.     Vitals:   07/24/20 0953  BP: 120/70  Pulse: 68  Temp: 98 F (36.7 C)  TempSrc: Oral  SpO2: 94%  Weight: 226 lb (102.5 kg)  Height: 4\' 11"  (1.499 m)   This visit occurred during the SARS-CoV-2 public health emergency.  Safety protocols were in place, including screening questions prior to the visit, additional usage of staff PPE, and extensive cleaning of exam room while observing appropriate contact time as indicated for disinfecting solutions.   Assessment & Plan:

## 2020-07-24 NOTE — Assessment & Plan Note (Signed)
Checking HgA1c and adjust as needed.  

## 2020-07-24 NOTE — Patient Instructions (Addendum)
We have sent in aricept (donepezil) to take 1 pill daily which we have sent in.  Prevagen is a medicine over the counter to help with memory also.

## 2020-07-30 ENCOUNTER — Other Ambulatory Visit: Payer: Self-pay | Admitting: Internal Medicine

## 2020-08-03 DIAGNOSIS — M5417 Radiculopathy, lumbosacral region: Secondary | ICD-10-CM | POA: Diagnosis not present

## 2020-08-09 DIAGNOSIS — Z1231 Encounter for screening mammogram for malignant neoplasm of breast: Secondary | ICD-10-CM | POA: Diagnosis not present

## 2020-08-11 DIAGNOSIS — H353211 Exudative age-related macular degeneration, right eye, with active choroidal neovascularization: Secondary | ICD-10-CM | POA: Diagnosis not present

## 2020-08-15 ENCOUNTER — Encounter: Payer: Self-pay | Admitting: Internal Medicine

## 2020-08-15 NOTE — Assessment & Plan Note (Signed)
Manages with Requip, clonazepam, gabapentin

## 2020-08-15 NOTE — Assessment & Plan Note (Signed)
Continues elevation and elastic hose. Plan- walking at "Y" encouraged if able

## 2020-08-15 NOTE — Assessment & Plan Note (Signed)
Remains O2 dependent, managing it well. Plan- continue O2 24/7

## 2020-08-17 ENCOUNTER — Other Ambulatory Visit: Payer: Self-pay | Admitting: Internal Medicine

## 2020-08-17 ENCOUNTER — Telehealth: Payer: Medicare Other

## 2020-08-17 NOTE — Chronic Care Management (AMB) (Deleted)
Chronic Care Management Pharmacy  Name: Lisa Hamilton  MRN: [id number] DOB: 1946/02/03   Chief Complaint/ HPI  Lisa Hamilton,  75 y.o. , female presents for their Follow-Up CCM visit with the clinical pharmacist via telephone.  PCP : Hoyt Koch, MD Patient Care Team: Hoyt Koch, MD as PCP - General (Internal Medicine) Irine Seal, MD (Urology) Rolm Bookbinder, MD (Dermatology) Rutherford Guys, MD (Ophthalmology) Phelan, Romilda Garret, [place] (Podiatry) Deneise Lever, MD (Pulmonary Disease) Milus Banister, MD (Gastroenterology) Jerelyn Charles, MD as Consulting Physician (Obstetrics) Edrick Kins, DPM as Consulting Physician (Podiatry) Jerelyn Charles, MD as Consulting Physician (Obstetrics) Suella Broad, MD as Consulting Physician (Physical Medicine and Rehabilitation) Charlton Haws, [facility] as Pharmacist (Pharmacist) Jalene Mullet, MD as Consulting Physician (Ophthalmology)  Their chronic conditions include: Hypertension, Hyperlipidemia, Asthma, Osteoporosis and Osteoarthritis, morbid obesity, anemia, RLS  Patient lives with husband in [place]. She spends a lot of time going to doctors' visits for her and her husband. She gets a massage every 2 weeks.  Office Visits: 07/24/20 Dr Sharlet Salina OV: chronic f/u, labs stable. Added donepezil 5 mg  07/23/19 Dr Sharlet Salina OV: chronic f/u BP, neck pain, osteoporosis. DEXA due.  Consult Visit: 07/06/20 Dr Annamaria Boots (pulmonary): f/u RLS, chronic resp failure. No med changes.  02/10/20 Dr [facility] f/u for macular degeneration  12/30/19 Dr Annamaria Boots (pulmonary): f/u for asthma, chronic resp failure, RLS. Consider Trelegy or Breo. Ordered portable O2 concentrator.   11/05/19 Dr Doran Durand (ortho): f/u for pain in feet, osteoarthritis, lymphedema.  09/16/19 Dr Ubaldo Glassing (dermatology): f/u for skin scancer  Allergies  Allergen Reactions  . Hydromorphone Hcl Other (See Comments)    Disorientation and delirium.   Marland Kitchen Penicillins  Rash and Other (See Comments)    PATIENT HAS HAD A PCN REACTION WITH IMMEDIATE RASH, FACIAL/TONGUE/THROAT SWELLING, SOB, OR LIGHTHEADEDNESS WITH HYPOTENSION:  #  #  YES  #  #  Has patient had a PCN reaction causing Hamilton rash involving mucus membranes or skin necrosis: No Has patient had a PCN reaction that required hospitalization: No Has patient had a PCN reaction occurring within the last 10 years: No If all of the above answers are "NO", then may proceed with Cephalosporin use.   . Adhesive [Tape] Rash and Other (See Comments)    Blisters The clear tape   . Amoxicillin-Pot Clavulanate Rash  . Codeine Other (See Comments)    nightmares    Medications: Outpatient Encounter Medications as of 08/17/2020  Medication Sig  . acetaminophen (TYLENOL) 500 MG tablet Take 1,000 mg by mouth 3 (three) times daily as needed for moderate pain or headache.  . albuterol (PROVENTIL) (2.5 MG/3ML) 0.083% nebulizer solution USE 1 VIAL IN NEBULIZER 4 TIMES DAILY. Generic: VENTOLIN  . albuterol (VENTOLIN HFA) 108 (90 Base) MCG/ACT inhaler USE 1 TO 2 INHALATIONS BY  MOUTH EVERY 6 HOURS AS  NEEDED FOR WHEEZING OR  SHORTNESS OF BREATH  . aspirin EC 81 MG tablet Take 162 mg by mouth at bedtime.  . Calcium Carb-Cholecalciferol 600-800 MG-UNIT TABS Take 1 tablet by mouth 2 (two) times daily.  . cephALEXin (KEFLEX) 250 MG capsule Take 250 mg by mouth daily.  . Cholecalciferol (VITAMIN D) 2000 units tablet Take 2,000 Units by mouth daily.  . clonazePAM (KLONOPIN) 0.5 MG tablet TAKE 1 TO 3 TABLETS BY  MOUTH 30 MINUTES BEFORE  BEDTIME  . Co-Enzyme Q-10 100 MG CAPS Take 100 mg by mouth daily.  . Cranberry 180 MG  CAPS Take 1 capsule by mouth daily.  . diclofenac Sodium (VOLTAREN) 1 % GEL   . donepezil (ARICEPT) 5 MG tablet Take 1 tablet (5 mg total) by mouth at bedtime.  . fluticasone (FLONASE) 50 MCG/ACT nasal spray Place 2 sprays into both nostrils daily.  . fluticasone furoate-vilanterol (BREO ELLIPTA) 100-25  MCG/INH AEPB Inhale 1 puff then rinse mouth,once daily  . furosemide (LASIX) 20 MG tablet Take 1 tablet (20 mg total) by mouth daily. (Patient taking differently: Take 20 mg by mouth daily as needed.)  . gabapentin (NEURONTIN) 300 MG capsule Take 1 capsule (300 mg total) by mouth 3 (three) times daily.  Marland Kitchen guaiFENesin (MUCINEX) 600 MG 12 hr tablet Take 600 mg by mouth 2 (two) times daily as needed (congestion).   Marland Kitchen ibuprofen (ADVIL,MOTRIN) 200 MG tablet Take 400 mg by mouth 3 (three) times daily as needed for headache or moderate pain.  Marland Kitchen losartan (COZAAR) 50 MG tablet TAKE 1 TABLET BY MOUTH  DAILY  . Magnesium 400 MG CAPS Take 400 mg by mouth 2 (two) times daily.   . montelukast (SINGULAIR) 10 MG tablet Take 1 tablet (10 mg total) by mouth every evening.  . Multiple Vitamins-Minerals (PRESERVISION AREDS 2) CAPS Take 1 capsule by mouth 2 (two) times daily.  . Omega-3 Fatty Acids (FISH OIL) 1200 MG CAPS Take 1,200 mg by mouth daily.  . OXYGEN Inhale 2 L into the lungs at bedtime.   . polyethylene glycol (MIRALAX / GLYCOLAX) packet Take 17 g by mouth 3 (three) times daily as needed for moderate constipation.  . pravastatin (PRAVACHOL) 40 MG tablet TAKE 1 TABLET BY MOUTH  DAILY  . Propylene Glycol (SYSTANE BALANCE OP) Place 1 drop into both eyes daily as needed (dry eyes).  Marland Kitchen rOPINIRole (REQUIP) 0.5 MG tablet Take up to 6 tablets by mouth daily as needed  . traMADol (ULTRAM) 50 MG tablet Take 1 tablet (50 mg total) by mouth daily as needed.  . vitamin B-12 (CYANOCOBALAMIN) 1000 MCG tablet Take 1,000 mcg by mouth daily.   No facility-administered encounter medications on file as of 08/17/2020.    Wt Readings from Last 3 Encounters:  07/24/20 226 lb (102.5 kg)  07/24/20 226 lb (102.5 kg)  07/06/20 231 lb 8 oz (105 kg)   Lab Results  Component Value Date   CREATININE 0.43 07/24/2020   BUN 21 07/24/2020   GFR 96.72 07/24/2020   GFRNONAA >60 12/11/2017   GFRAA >60 12/11/2017   NA 143  07/24/2020   K 3.6 07/24/2020   CALCIUM 8.9 07/24/2020   CO2 34 (H) 07/24/2020    Current Diagnosis/Assessment:    Goals Addressed   None     Hypertension   BP goal is:  <130/80  Office blood pressures are  BP Readings from Last 3 Encounters:  07/24/20 120/70  07/24/20 120/78  07/06/20 126/84   Patient checks BP at home infrequently Patient home BP readings are ranging: n/a  Patient has failed these meds in the past: n/a Patient is currently controlled on the following medications:  . Losartan 50 mg daily . Furosemide 20 mg PRN  We discussed diet and exercise extensively; pt takes furosemide infrequently due to urination; she does wear compression socks; advised to reduce salt in diet  Plan  Continue current medications and control with diet and exercise     Hyperlipidemia   LDL goal 30% reduction (<100)  Last lipids Lab Results  Component Value Date   CHOL 192 07/24/2020  HDL 36.10 (L) 07/24/2020   LDLCALC 119 (H) 07/24/2020   TRIG 185.0 (H) 07/24/2020   CHOLHDL 5 07/24/2020   Hepatic Function Latest Ref Rng & Units 07/24/2020 11/16/2019 01/08/2019  Total Protein 6.0 - 8.3 g/dL 6.6 6.7 6.7  Albumin 3.5 - 5.2 g/dL 3.8 4.0 4.0  AST 0 - 37 U/L _0 ALT 0 - 35 U/L _1 Alk Phosphatase 39 - 117 U/L 83 91 88  Total Bilirubin 0.2 - 1.2 mg/dL 0.5 0.5 0.5  Bilirubin, Direct 0.01 - 0.4 mg/dL - - -     The 10-year ASCVD risk score Mikey Bussing DC Jr., et al., 2013) is: 19.6%   Values used to calculate the score:     Age: 89 years     Sex: Female     Is Non-Hispanic African American: No     Diabetic: No     Tobacco smoker: No     Systolic Blood Pressure: 741 mmHg     Is BP treated: Yes     HDL Cholesterol: 36.1 mg/dL     Total Cholesterol: 192 mg/dL   Patient has failed these meds in past: atorvastatin 20 mg, simvastatin 40 mg,  Patient is currently uncontrolled on the following medications:  . Pravastatin 40 mg daily . Coenzyme Q10 100 mg  daily . Aspirin 81 mg daily . Omega-3 FA Fish Oil 1000 mg daily  We discussed:  diet and exercise extensively ; Cholesterol goals; benefits of statin for ASCVD risk reduction; pt is due for repeat lipid panel, she has f/u with PCP next month; discussed previous side effects with statins were muscle aches; she denies issues with pravastatin; may consider switch to rosuvastatin if higher intensity statin is needed to achieve LDL goal  Plan  Continue current medications and control with diet and exercise   Asthma / Chronic respiratory failure   Last spirometry score: 12/30/19 -FEV1 36% predicted -FEV1 19% change  Patient has failed these meds in past: n/a Patient is currently controlled on the following medications:  . Albuterol 0.083% nebulizer soln BID prn . Albuterol HFA prn . Breo Ellipta 100-25 mcg/inh 1 puff daily . Montelukast 10 mg daily HS  Using maintenance inhaler regularly? Yes Frequency of rescue inhaler use:  multiple times per day  We discussed:  proper inhaler technique; pt follows regularly with pulmonology; pt reports [facility] is expensive but she does not qualify for patient assistance  Plan  Continue current medications   Osteoporosis   Last DEXA Scan: 10/12/19  T-Score femoral neck: -1.7  T-Score total hip: -1.6  T-Score lumbar spine: n/a  T-Score forearm radius: -3.2  10-year probability of major osteoporotic fracture: n/a  10-year probability of hip fracture: n/a  Last vitamin D/Calcium Lab Results  Component Value Date   VD25OH 27.55 (L) 07/24/2020   CALCIUM 8.9 07/24/2020   Patient is a candidate for pharmacologic treatment due to T score < 2.5  Patient has failed these meds in past: alendronate (2012) Patient is currently controlled on the following medications:  . Calcium 600 mg-Vitamin D 800 IU BID . Vitamin D 2000 IU daily  We discussed:  Recommend [phone] units of vitamin D daily. Recommend 1200 mg of calcium daily from dietary and  supplemental sources.  Plan  Continue current medications  RLS   Patient has failed these meds in past: n/a Patient is currently controlled on the following medications:  . Ropinirole 0.5 mg - up to 6 tab daily (Dr Annamaria Boots) .  Gabapentin 300 mg TID (Dr Annamaria Boots) . Clonazepam 0.5 mg - 1 tab PM and 1/2 tab HS  We discussed:  Benefits/risks of medications; long term risk associated with benzodiazepines; advised pt to use lowest effective dose of clonazepam  Plan  Continue current medications  Mild cognitive impairment   Patient has failed these meds in past: *** Patient is currently {CHL Controlled/Uncontrolled:[phone]} on the following medications:  . Donepezil 5 mg HS  We discussed:  ***  Plan  Continue {CHL [facility] [phone]}  Pain   Osteoarthritis Cervical disc degeneration Lumbar disc degeneration  Patient has failed these meds in past: n/a Patient is currently controlled on the following medications:  . Tylenol 500 mg PRN . Ibuprofen 200 mg PRN . Tramadol 50 mg daily PRN (ordered 07/2017) . Gabapentin 300 mg TID . Voltaren 1% gel PRN  We discussed:  Pt reports she takes tramadol infrequently; mainly treats pain with Ibuprofen and Tylenol; pt follows with pain management  Plan  Continue current medications  Frequent UTI   Patient has failed these meds in past: n/a Patient is currently controlled on the following medications:  . Cephalexin 250 mg daily  We discussed:  Pt has follow up with urologist next month and will decide then whether to continue prophylaxis  Plan  Continue current medications  Health Maintenance   Patient is currently controlled on the following medications:  Marland Kitchen Miralax PRN . Magnesium 400 mg BID . Multivitamin . Preservision Areds 2 . Systane eye drops PRN . Fluticasone nasal spray PRN . Mucinex 600 mg PRN . [facility] . Super Beets  We discussed:  Patient is satisfied with current regimen and denies  issues. Pt would like more information about cost-effectiveness of [facility] and Super Beets supplements  Plan  Continue current medications  Medication Management   Pt uses [facility] mail order pharmacy for all medications Uses pill box? Yes Pt endorses 100% compliance  We discussed: Current pharmacy is preferred with insurance plan and patient is satisfied with pharmacy services; pt is also switching to a [employer] plan next year after speaking with [employer] representatives for cost savings. Medications will need to be switched to [facility] mail order pharmacy in January.  Plan  Continue current medication management strategy  Switch rx's to [facility] mail order in January     Follow up: 2 month phone visit  Charlene Brooke, PharmD, [facility] Clinical Pharmacist [facility] [phone]

## 2020-08-23 ENCOUNTER — Telehealth: Payer: Self-pay | Admitting: Pharmacist

## 2020-08-23 NOTE — Progress Notes (Addendum)
Chronic Care Management Pharmacy Assistant   Name: Lisa Hamilton  MRN: [id number] DOB: 07-Sep-1945  Reason for Encounter: Medication Review  PCP : Hoyt Koch, MD  Allergies:   Allergies  Allergen Reactions   Hydromorphone Hcl Other (See Comments)    Disorientation and delirium.    Penicillins Rash and Other (See Comments)    PATIENT HAS HAD A PCN REACTION WITH IMMEDIATE RASH, FACIAL/TONGUE/THROAT SWELLING, SOB, OR LIGHTHEADEDNESS WITH HYPOTENSION:  #  #  YES  #  #  Has patient had a PCN reaction causing severe rash involving mucus membranes or skin necrosis: No Has patient had a PCN reaction that required hospitalization: No Has patient had a PCN reaction occurring within the last 10 years: No If all of the above answers are "NO", then may proceed with Cephalosporin use.    Adhesive [Tape] Rash and Other (See Comments)    Blisters The clear tape    Amoxicillin-Pot Clavulanate Rash   Codeine Other (See Comments)    nightmares    Medications: Outpatient Encounter Medications as of 08/23/2020  Medication Sig   acetaminophen (TYLENOL) 500 MG tablet Take 1,000 mg by mouth 3 (three) times daily as needed for moderate pain or headache.   albuterol (PROVENTIL) (2.5 MG/3ML) 0.083% nebulizer solution USE 1 VIAL IN NEBULIZER 4 TIMES DAILY. Generic: VENTOLIN   albuterol (VENTOLIN HFA) 108 (90 Base) MCG/ACT inhaler USE 1 TO 2 INHALATIONS BY  MOUTH EVERY 6 HOURS AS  NEEDED FOR WHEEZING OR  SHORTNESS OF BREATH   aspirin EC 81 MG tablet Take 162 mg by mouth at bedtime.   Calcium Carb-Cholecalciferol 600-800 MG-UNIT TABS Take 1 tablet by mouth 2 (two) times daily.   cephALEXin (KEFLEX) 250 MG capsule Take 250 mg by mouth daily.   Cholecalciferol (VITAMIN D) 2000 units tablet Take 2,000 Units by mouth daily.   clonazePAM (KLONOPIN) 0.5 MG tablet TAKE 1 TO 3 TABLETS BY  MOUTH 30 MINUTES BEFORE  BEDTIME   Co-Enzyme Q-10 100 MG CAPS Take 100 mg by mouth daily.   Cranberry 180 MG CAPS  Take 1 capsule by mouth daily.   diclofenac Sodium (VOLTAREN) 1 % GEL    donepezil (ARICEPT) 5 MG tablet Take 1 tablet (5 mg total) by mouth at bedtime.   fluticasone (FLONASE) 50 MCG/ACT nasal spray Place 2 sprays into both nostrils daily.   fluticasone furoate-vilanterol (BREO ELLIPTA) 100-25 MCG/INH AEPB Inhale 1 puff then rinse mouth,once daily   furosemide (LASIX) 20 MG tablet Take 1 tablet (20 mg total) by mouth daily. (Patient taking differently: Take 20 mg by mouth daily as needed.)   gabapentin (NEURONTIN) 300 MG capsule Take 1 capsule (300 mg total) by mouth 3 (three) times daily.   guaiFENesin (MUCINEX) 600 MG 12 hr tablet Take 600 mg by mouth 2 (two) times daily as needed (congestion).    ibuprofen (ADVIL,MOTRIN) 200 MG tablet Take 400 mg by mouth 3 (three) times daily as needed for headache or moderate pain.   losartan (COZAAR) 50 MG tablet TAKE 1 TABLET BY MOUTH  DAILY   Magnesium 400 MG CAPS Take 400 mg by mouth 2 (two) times daily.    montelukast (SINGULAIR) 10 MG tablet Take 1 tablet (10 mg total) by mouth every evening.   Multiple Vitamins-Minerals (PRESERVISION AREDS 2) CAPS Take 1 capsule by mouth 2 (two) times daily.   Omega-3 Fatty Acids (FISH OIL) 1200 MG CAPS Take 1,200 mg by mouth daily.   OXYGEN Inhale 2 L  into the lungs at bedtime.    polyethylene glycol (MIRALAX / GLYCOLAX) packet Take 17 g by mouth 3 (three) times daily as needed for moderate constipation.   pravastatin (PRAVACHOL) 40 MG tablet TAKE 1 TABLET BY MOUTH  DAILY   Propylene Glycol (SYSTANE BALANCE OP) Place 1 drop into both eyes daily as needed (dry eyes).   rOPINIRole (REQUIP) 0.5 MG tablet Take up to 6 tablets by mouth daily as needed   traMADol (ULTRAM) 50 MG tablet Take 1 tablet (50 mg total) by mouth daily as needed.   vitamin B-12 (CYANOCOBALAMIN) 1000 MCG tablet Take 1,000 mcg by mouth daily.   No facility-administered encounter medications on file as of 08/23/2020.    Current  Diagnosis: Patient Active Problem List   Diagnosis Date Noted   Mild cognitive disorder 07/24/2020   Peripheral edema 02/12/2020   History of colonic polyps    Chronic respiratory failure with hypoxia ([place]) 03/04/2018   Nephrolithiasis 10/15/2017   Anemia 12/22/2015   Hyperglycemia 01/04/2014   NONTOXIC MULTINODULAR GOITER 08/06/2008   HYPERCHOLESTEROLEMIA 08/06/2008   RESTLESS LEG SYNDROME 08/06/2008   Essential hypertension 08/06/2008   Diverticulosis of colon without hemorrhage 08/06/2008   Liver cyst s/p partial hepatectomy 12/04/2017 08/06/2008   MEDULLARY SPONGE KIDNEY 08/06/2008   VITAMIN D DEFICIENCY 10/21/2007   Morbid obesity ([place]) 10/21/2007   GLAUCOMA 10/21/2007   Asthma in adult, mild persistent, uncomplicated 26/94/8546   Osteoarthritis 10/21/2007   Osteoporosis 10/21/2007    Goals Addressed   None     Follow-Up:  Pharmacist Review  The patient called because she has questions about her Donepezil 5mg . She stated that has been taking the medication daily for the last month and the medication has been responding well to the medication. But recently her husbands Donepezil was increased to 10mg . She was wondering is hers needs to be increased as well since 10mg  is the regular maintenance dose. The patient would like to speak with clinical pharmacist Mendel Ryder. Will forward information to [place].   Rosendo Gros, [facility]  Practice Team Manager/ CPA (Clinical Pharmacist Assistant) [phone]

## 2020-08-25 NOTE — Telephone Encounter (Signed)
Patient is interested in increasing donepezil to 10 mg (her husband is on 10 mg maintenance dose).  Patient started donepezil about 4 weeks ago. Per medication labeling it is reasonable to increase donepezil to 10 mg after 4-6 weeks if patient tolerates 5 mg well. Contacted patient to discuss tolerability - she denies any side effects.   Patient wishes to increase to 10 mg. She is in the process of setting up a new mail pharmacy with new insurance, so she requests a hard-copy prescription be mailed to her so she can send it to the new pharmacy when she gets around to it.

## 2020-08-28 NOTE — Telephone Encounter (Signed)
Can just call us back with pharmacy information and we will send it in for her then.

## 2020-08-30 DIAGNOSIS — M47816 Spondylosis without myelopathy or radiculopathy, lumbar region: Secondary | ICD-10-CM | POA: Diagnosis not present

## 2020-08-31 ENCOUNTER — Other Ambulatory Visit: Payer: Self-pay | Admitting: Internal Medicine

## 2020-09-01 DIAGNOSIS — M47816 Spondylosis without myelopathy or radiculopathy, lumbar region: Secondary | ICD-10-CM | POA: Diagnosis not present

## 2020-09-04 ENCOUNTER — Telehealth: Payer: Self-pay | Admitting: Pharmacist

## 2020-09-04 NOTE — Progress Notes (Signed)
Chronic Care Management Pharmacy Assistant   Name: Lisa Hamilton  MRN: [id number] DOB: Jul 22, 1946  Reason for Encounter: Chart Review   PCP : Hoyt Koch, MD  Allergies:   Allergies  Allergen Reactions   Hydromorphone Hcl Other (See Comments)    Disorientation and delirium.    Penicillins Rash and Other (See Comments)    PATIENT HAS HAD A PCN REACTION WITH IMMEDIATE RASH, FACIAL/TONGUE/THROAT SWELLING, SOB, OR LIGHTHEADEDNESS WITH HYPOTENSION:  #  #  YES  #  #  Has patient had a PCN reaction causing severe rash involving mucus membranes or skin necrosis: No Has patient had a PCN reaction that required hospitalization: No Has patient had a PCN reaction occurring within the last 10 years: No If all of the above answers are "NO", then may proceed with Cephalosporin use.    Adhesive [Tape] Rash and Other (See Comments)    Blisters The clear tape    Amoxicillin-Pot Clavulanate Rash   Codeine Other (See Comments)    nightmares    Medications: Outpatient Encounter Medications as of 09/04/2020  Medication Sig   acetaminophen (TYLENOL) 500 MG tablet Take 1,000 mg by mouth 3 (three) times daily as needed for moderate pain or headache.   albuterol (PROVENTIL) (2.5 MG/3ML) 0.083% nebulizer solution USE 1 VIAL IN NEBULIZER 4 TIMES DAILY. Generic: VENTOLIN   albuterol (VENTOLIN HFA) 108 (90 Base) MCG/ACT inhaler USE 1 TO 2 INHALATIONS BY  MOUTH EVERY 6 HOURS AS  NEEDED FOR WHEEZING OR  SHORTNESS OF BREATH   aspirin EC 81 MG tablet Take 162 mg by mouth at bedtime.   Calcium Carb-Cholecalciferol 600-800 MG-UNIT TABS Take 1 tablet by mouth 2 (two) times daily.   cephALEXin (KEFLEX) 250 MG capsule Take 250 mg by mouth daily.   Cholecalciferol (VITAMIN D) 2000 units tablet Take 2,000 Units by mouth daily.   clonazePAM (KLONOPIN) 0.5 MG tablet TAKE 1 TO 3 TABLETS BY  MOUTH 30 MINUTES BEFORE  BEDTIME   Co-Enzyme Q-10 100 MG CAPS Take 100 mg by mouth daily.   Cranberry  180 MG CAPS Take 1 capsule by mouth daily.   diclofenac Sodium (VOLTAREN) 1 % GEL    donepezil (ARICEPT) 5 MG tablet Take 1 tablet (5 mg total) by mouth at bedtime.   fluticasone (FLONASE) 50 MCG/ACT nasal spray Place 2 sprays into both nostrils daily.   fluticasone furoate-vilanterol (BREO ELLIPTA) 100-25 MCG/INH AEPB Inhale 1 puff then rinse mouth,once daily   furosemide (LASIX) 20 MG tablet Take 1 tablet (20 mg total) by mouth daily. (Patient taking differently: Take 20 mg by mouth daily as needed.)   gabapentin (NEURONTIN) 300 MG capsule Take 1 capsule (300 mg total) by mouth 3 (three) times daily.   guaiFENesin (MUCINEX) 600 MG 12 hr tablet Take 600 mg by mouth 2 (two) times daily as needed (congestion).    ibuprofen (ADVIL,MOTRIN) 200 MG tablet Take 400 mg by mouth 3 (three) times daily as needed for headache or moderate pain.   losartan (COZAAR) 50 MG tablet TAKE 1 TABLET BY MOUTH  DAILY   Magnesium 400 MG CAPS Take 400 mg by mouth 2 (two) times daily.    montelukast (SINGULAIR) 10 MG tablet Take 1 tablet (10 mg total) by mouth every evening.   Multiple Vitamins-Minerals (PRESERVISION AREDS 2) CAPS Take 1 capsule by mouth 2 (two) times daily.   Omega-3 Fatty Acids (FISH OIL) 1200 MG CAPS Take 1,200 mg by mouth daily.   OXYGEN Inhale 2  L into the lungs at bedtime.    polyethylene glycol (MIRALAX / GLYCOLAX) packet Take 17 g by mouth 3 (three) times daily as needed for moderate constipation.   pravastatin (PRAVACHOL) 40 MG tablet TAKE 1 TABLET BY MOUTH  DAILY   Propylene Glycol (SYSTANE BALANCE OP) Place 1 drop into both eyes daily as needed (dry eyes).   rOPINIRole (REQUIP) 0.5 MG tablet Take up to 6 tablets by mouth daily as needed   traMADol (ULTRAM) 50 MG tablet Take 1 tablet (50 mg total) by mouth daily as needed.   vitamin B-12 (CYANOCOBALAMIN) 1000 MCG tablet Take 1,000 mcg by mouth daily.   No facility-administered encounter medications on file as of 09/04/2020.     Current Diagnosis: Patient Active Problem List   Diagnosis Date Noted   Mild cognitive disorder 07/24/2020   Peripheral edema 02/12/2020   History of colonic polyps    Chronic respiratory failure with hypoxia ([place]) 03/04/2018   Nephrolithiasis 10/15/2017   Anemia 12/22/2015   Hyperglycemia 01/04/2014   NONTOXIC MULTINODULAR GOITER 08/06/2008   HYPERCHOLESTEROLEMIA 08/06/2008   RESTLESS LEG SYNDROME 08/06/2008   Essential hypertension 08/06/2008   Diverticulosis of colon without hemorrhage 08/06/2008   Liver cyst s/p partial hepatectomy 12/04/2017 08/06/2008   MEDULLARY SPONGE KIDNEY 08/06/2008   VITAMIN D DEFICIENCY 10/21/2007   Morbid obesity ([place]) 10/21/2007   GLAUCOMA 10/21/2007   Asthma in adult, mild persistent, uncomplicated 25/42/7062   Osteoarthritis 10/21/2007   Osteoporosis 10/21/2007    Goals Addressed   None     Follow-Up:  Pharmacist Review   Reviewed chart for medication changes and adherence.   No gaps in adherence identified. Patient has follow up scheduled with pharmacy team. No further action required.   Wendy Poet, clinical Pharmacist Assistant [facility] [phone]

## 2020-09-06 ENCOUNTER — Other Ambulatory Visit: Payer: Self-pay | Admitting: Internal Medicine

## 2020-09-06 DIAGNOSIS — J4530 Mild persistent asthma, uncomplicated: Secondary | ICD-10-CM

## 2020-09-06 DIAGNOSIS — J9611 Chronic respiratory failure with hypoxia: Secondary | ICD-10-CM

## 2020-09-08 ENCOUNTER — Telehealth: Payer: Self-pay | Admitting: Internal Medicine

## 2020-09-08 NOTE — Telephone Encounter (Signed)
Patient is requesting a call back.

## 2020-09-08 NOTE — Telephone Encounter (Signed)
Noted  

## 2020-09-14 ENCOUNTER — Ambulatory Visit (INDEPENDENT_AMBULATORY_CARE_PROVIDER_SITE_OTHER): Payer: Medicare Other | Admitting: Pharmacist

## 2020-09-14 ENCOUNTER — Other Ambulatory Visit: Payer: Self-pay

## 2020-09-14 DIAGNOSIS — J4530 Mild persistent asthma, uncomplicated: Secondary | ICD-10-CM | POA: Diagnosis not present

## 2020-09-14 DIAGNOSIS — E7800 Pure hypercholesterolemia, unspecified: Secondary | ICD-10-CM | POA: Diagnosis not present

## 2020-09-14 DIAGNOSIS — I10 Essential (primary) hypertension: Secondary | ICD-10-CM

## 2020-09-14 DIAGNOSIS — F09 Unspecified mental disorder due to known physiological condition: Secondary | ICD-10-CM

## 2020-09-14 MED ORDER — LOSARTAN POTASSIUM 50 MG PO TABS: 50.0000 mg | ORAL_TABLET | Freq: Every day | ORAL | 3 refills | Status: AC

## 2020-09-14 MED ORDER — PRAVASTATIN SODIUM 40 MG PO TABS
40.0000 mg | ORAL_TABLET | Freq: Every day | ORAL | 3 refills | Status: AC
Start: 2020-09-14 — End: ?

## 2020-09-14 NOTE — Progress Notes (Signed)
Chronic Care Management Pharmacy Note  09/14/2020 Name:  Lisa Hamilton MRN:  [id number] DOB:  [birth year]  Subjective: Lisa Hamilton is an 75 y.o. year old female who is a primary patient of Hoyt Koch, MD.  The CCM team was consulted for assistance with disease management and care coordination needs.    Engaged with patient by telephone for follow up visit in response to provider referral for pharmacy case management and/or care coordination services.   Consent to Services:  The patient was given the following information about Chronic Care Management services today, agreed to services, and gave verbal consent: 1. CCM service includes personalized support from designated clinical staff supervised by the primary care provider, including individualized plan of care and coordination with other care providers 2. 24/7 contact phone numbers for assistance for urgent and routine care needs. 3. Service will only be billed when office clinical staff spend 20 minutes or more in a month to coordinate care. 4. Only one practitioner may furnish and bill the service in a calendar month. 5.The patient may stop CCM services at any time (effective at the end of the month) by phone call to the office staff. 6. The patient will be responsible for cost sharing (co-pay) of up to 20% of the service fee (after annual deductible is met). Patient agreed to services and consent obtained.  Patient Care Team: Hoyt Koch, MD as PCP - General (Internal Medicine) Irine Seal, MD (Urology) Rolm Bookbinder, MD (Dermatology) Rutherford Guys, MD (Ophthalmology) [place], Romilda Garret, [place] (Podiatry) Deneise Lever, MD (Pulmonary Disease) Milus Banister, MD (Gastroenterology) Jerelyn Charles, MD as Consulting Physician (Obstetrics) Edrick Kins, DPM as Consulting Physician (Podiatry) Jerelyn Charles, MD as Consulting Physician (Obstetrics) Suella Broad, MD as Consulting Physician (Physical Medicine and  Rehabilitation) Charlton Haws, [facility] as Pharmacist (Pharmacist) Jalene Mullet, MD as Consulting Physician (Ophthalmology)  Recent office visits: 07/22/20 Dr Sharlet Salina OV: chronic f/u. Added donepezil 5 mg for mild cognitive impairment.  Recent consult visits: 02/10/20 Dr Gershon Crane [facility] eye care): f/u for macular degeneration  12/30/19 Dr Annamaria Boots (pulmonary): f/u for asthma, chronic resp failure, RLS. Consider Trelegy or Breo. Ordered portable O2 concentrator.   11/05/19 Dr Doran Durand (ortho): f/u for pain in feet, osteoarthritis, lymphedema.  09/16/19 Dr Ubaldo Glassing (dermatology): f/u for skin [facility] visits: None in previous 6 months  Objective:  Lab Results  Component Value Date   CREATININE 0.43 07/24/2020   BUN 21 07/24/2020   GFR 96.72 07/24/2020   GFRNONAA >60 12/11/2017   GFRAA >60 12/11/2017   NA 143 07/24/2020   K 3.6 07/24/2020   CALCIUM 8.9 07/24/2020   CO2 34 (H) 07/24/2020    Lab Results  Component Value Date/Time   HGBA1C 5.9 07/24/2020 10:38 AM   HGBA1C 5.9 01/08/2019 09:24 AM   GFR 96.72 07/24/2020 10:38 AM   GFR 130.11 11/16/2019 10:30 AM    Last diabetic Eye exam: No results found for: HMDIABEYEEXA  Last diabetic Foot exam: No results found for: HMDIABFOOTEX   Lab Results  Component Value Date   CHOL 192 07/24/2020   HDL 36.10 (L) 07/24/2020   LDLCALC 119 (H) 07/24/2020   TRIG 185.0 (H) 07/24/2020   CHOLHDL 5 07/24/2020    Hepatic Function Latest Ref Rng & Units 07/24/2020 11/16/2019 01/08/2019  Total Protein 6.0 - 8.3 g/dL 6.6 6.7 6.7  Albumin 3.5 - 5.2 g/dL 3.8 4.0 4.0  AST 0 - 37 U/L $Remo'18 18 15  'jcPZZ$ ALT 0 -  35 U/L $Remo'19 18 16  'meMCj$ Alk Phosphatase 39 - 117 U/L 83 91 88  Total Bilirubin 0.2 - 1.2 mg/dL 0.5 0.5 0.5  Bilirubin, Direct 0.01 - 0.4 mg/dL - - -    Lab Results  Component Value Date/Time   TSH 1.18 07/24/2020 10:38 AM   TSH 3.26 08/30/2015 08:53 AM   FREET4 1.06 08/30/2015 08:53 AM    CBC Latest Ref Rng & Units 07/24/2020 11/16/2019  01/08/2019  WBC 4.0 - 10.5 K/uL 5.8 5.3 5.6  Hemoglobin 12.0 - 15.0 g/dL 13.4 14.5 14.7  Hematocrit 36.0 - 46.0 % 41.0 44.5 43.9  Platelets 150.0 - 400.0 K/uL 216.0 232.0 222.0    Lab Results  Component Value Date/Time   VD25OH 27.55 (L) 07/24/2020 10:38 AM    Clinical ASCVD: No  The 10-year ASCVD risk score Mikey Bussing DC Jr., et al., 2013) is: 19.6%   Values used to calculate the score:     Age: 75 years     Sex: Female     Is Non-Hispanic African American: No     Diabetic: No     Tobacco smoker: No     Systolic Blood Pressure: 379 mmHg     Is BP treated: Yes     HDL Cholesterol: 36.1 mg/dL     Total Cholesterol: 192 mg/dL    Depression screen [facility] 2/9 07/24/2020 07/23/2019 01/08/2019  Decreased Interest 0 0 0  Down, Depressed, Hopeless 0 1 1  PHQ - 2 Score 0 1 1  Altered sleeping - 2 -  Tired, decreased energy - 1 -  Change in appetite - 0 -  Feeling bad or failure about yourself  - 0 -  Trouble concentrating - 0 -  Moving slowly or fidgety/restless - 0 -  Suicidal thoughts - 0 -  PHQ-9 Score - 4 -  Difficult doing work/chores - Not difficult at all -    Social History   Tobacco Use  Smoking Status Never Smoker  Smokeless Tobacco Never Used   BP Readings from Last 3 Encounters:  07/24/20 120/70  07/24/20 120/78  07/06/20 126/84   Pulse Readings from Last 3 Encounters:  07/24/20 68  07/24/20 68  07/06/20 83   Wt Readings from Last 3 Encounters:  07/24/20 226 lb (102.5 kg)  07/24/20 226 lb (102.5 kg)  07/06/20 231 lb 8 oz (105 kg)    Assessment/Interventions: Review of patient past medical history, allergies, medications, health status, including review of consultants reports, laboratory and other test data, was performed as part of comprehensive evaluation and provision of chronic care management services.   SDOH:  (Social Determinants of Health) assessments and interventions performed: Yes   CCM Care Plan  Allergies  Allergen Reactions  . Hydromorphone  Hcl Other (See Comments)    Disorientation and delirium.   Marland Kitchen Penicillins Rash and Other (See Comments)    PATIENT HAS HAD A PCN REACTION WITH IMMEDIATE RASH, FACIAL/TONGUE/THROAT SWELLING, SOB, OR LIGHTHEADEDNESS WITH HYPOTENSION:  #  #  YES  #  #  Has patient had a PCN reaction causing severe rash involving mucus membranes or skin necrosis: No Has patient had a PCN reaction that required hospitalization: No Has patient had a PCN reaction occurring within the last 10 years: No If all of the above answers are "NO", then may proceed with Cephalosporin use.   . Adhesive [Tape] Rash and Other (See Comments)    Blisters The clear tape   . Amoxicillin-Pot Clavulanate Rash  . Codeine Other (  See Comments)    nightmares    Medications Reviewed Today    Reviewed by Charlton Haws, [facility] (Pharmacist) on 09/14/20 at [phone]  Med List Status: <None>  Medication Order Taking? Sig Documenting Provider Last Dose Status Informant  acetaminophen (TYLENOL) 500 MG tablet [id number] Yes Take 1,000 mg by mouth 3 (three) times daily as needed for moderate pain or headache. [provider] Taking Active Self           Med Note Nat Christen   Tue Sep 01, 2018  1:09 PM)    albuterol (PROVENTIL) (2.5 MG/3ML) 0.083% nebulizer solution [id number] Yes USE 1 VIAL IN NEBULIZER 4 TIMES DAILY. Generic: Caryl Never, MD Taking Active   albuterol (VENTOLIN HFA) 108 (90 Base) MCG/ACT inhaler [id number] Yes USE 1 TO 2 INHALATIONS BY  MOUTH EVERY 6 HOURS AS  NEEDED FOR WHEEZING OR  SHORTNESS OF Blase Mess D, MD Taking Active   aspirin EC 81 MG tablet [id number] Yes Take 162 mg by mouth at bedtime. [provider] Taking Active Self  Calcium Carb-Cholecalciferol 600-800 MG-UNIT TABS [id number] Yes Take 1 tablet by mouth 2 (two) times daily. [provider] Taking Active Self  Cholecalciferol (VITAMIN D) 2000 units tablet [id number] Yes Take 2,000 Units by mouth daily. [provider] Taking Active Self  clonazePAM (KLONOPIN) 0.5 MG tablet [id number] Yes TAKE 1 TO 3 TABLETS BY  MOUTH 30 MINUTES BEFORE  BEDTIME Baird Lyons D, MD Taking Active   Co-Enzyme Q-10 100 MG CAPS [id number] Yes Take 100 mg by mouth daily. [provider] Taking Active Self  Cranberry 180 MG CAPS [id number] Yes Take 1 capsule by mouth daily. [provider] Taking Active   diclofenac Sodium (VOLTAREN) 1 % GEL [id number] Yes  [provider] Taking Active   donepezil (ARICEPT) 5 MG tablet [id number] Yes Take 1 tablet (5 mg total) by mouth at bedtime.  Patient taking differently: Take 10 mg by mouth at bedtime.   Hoyt Koch, MD Taking Active   fluticasone [facility]) 50 MCG/ACT nasal spray [id number] Yes Place 2 sprays into both nostrils daily. Hoyt Koch, MD Taking Active   fluticasone furoate-vilanterol (BREO ELLIPTA) 100-25 MCG/INH AEPB [id number] Yes Inhale 1 puff then rinse mouth,once daily Baird Lyons D, MD Taking Active   furosemide (LASIX) 20 MG tablet [id number] Yes Take 1 tablet (20 mg total) by mouth daily.  Patient taking differently: Take 20 mg by mouth daily as needed.   Deneise Lever, MD Taking Active   gabapentin (NEURONTIN) 300 MG capsule [id number] Yes Take 1 capsule (300 mg total) by mouth 3 (three) times daily. Deneise Lever, MD Taking Active   guaiFENesin (MUCINEX) 600 MG 12 hr tablet [id number] Yes Take 600 mg by mouth 2 (two) times daily as needed (congestion).  [provider] Taking Active Self  ibuprofen (ADVIL,MOTRIN) 200 MG tablet [id number] Yes Take 400 mg by mouth 3 (three) times daily as needed for headache or moderate pain. [provider] Taking Active Self           Med Note Luiz Ochoa Sep 01, 2018  1:09 PM)    losartan (COZAAR) 50 MG tablet [id number] Yes TAKE 1 TABLET BY MOUTH  DAILY Hoyt Koch, MD Taking Active   Magnesium 400 MG CAPS [id number] Yes Take 400 mg by mouth 2  (two) times daily.  [provider] Taking Active Self  montelukast (SINGULAIR) 10 MG tablet  [id number] Yes Take 1 tablet (10 mg total) by mouth every evening. Deneise Lever, MD Taking Active   Multiple Vitamins-Minerals (PRESERVISION AREDS 2) CAPS [id number] Yes Take 1 capsule by mouth 2 (two) times daily. [provider] Taking Active Self  Omega-3 Fatty Acids (FISH OIL) 1200 MG CAPS [id number] Yes Take 1,200 mg by mouth daily. [provider] Taking Active Self  OXYGEN [id number] Yes Inhale 2 L into the lungs at bedtime.  [provider] Taking Active Self  polyethylene glycol (MIRALAX / GLYCOLAX) packet [id number] Yes Take 17 g by mouth 3 (three) times daily as needed for moderate constipation. [provider] Taking Active Self  pravastatin (PRAVACHOL) 40 MG tablet [id number] Yes TAKE 1 TABLET BY MOUTH  DAILY Hoyt Koch, MD Taking Active   Propylene Glycol (SYSTANE BALANCE OP) [id number] Yes Place 1 drop into both eyes daily as needed (dry eyes). [provider] Taking Active Self  rOPINIRole (REQUIP) 0.5 MG tablet [id number] Yes Take up to 6 tablets by mouth daily as needed Baird Lyons D, MD Taking Active   traMADol (ULTRAM) 50 MG tablet [id number] Yes Take 1 tablet (50 mg total) by mouth daily as needed. Hoyt Koch, MD Taking Active Self  vitamin B-12 (CYANOCOBALAMIN) 1000 MCG tablet [id number] Yes Take 1,000 mcg by mouth daily. [provider] Taking Active           Patient Active Problem List   Diagnosis Date Noted  . Mild cognitive disorder 07/24/2020  . Peripheral edema 02/12/2020  . History of colonic polyps   . Chronic respiratory failure with hypoxia ([employer]) 03/04/2018  . Nephrolithiasis 10/15/2017  . Anemia 12/22/2015  . Hyperglycemia 01/04/2014  . NONTOXIC MULTINODULAR GOITER 08/06/2008  . HYPERCHOLESTEROLEMIA 08/06/2008  . RESTLESS LEG SYNDROME 08/06/2008  . Essential hypertension 08/06/2008   . Diverticulosis of colon without hemorrhage 08/06/2008  . Liver cyst s/p partial hepatectomy 12/04/2017 08/06/2008  . MEDULLARY SPONGE KIDNEY 08/06/2008  . VITAMIN D DEFICIENCY 10/21/2007  . Morbid obesity ([place]) 10/21/2007  . GLAUCOMA 10/21/2007  . Asthma in adult, mild persistent, uncomplicated 09/98/3382  . Osteoarthritis 10/21/2007  . Osteoporosis 10/21/2007    Immunization History  Administered Date(s) Administered  . H1N1 07/27/2008  . Influenza Split 05/16/2011  . Influenza, High Dose Seasonal PF 06/13/2015, 05/11/2016, 05/06/2018, 05/11/2019  . Influenza,inj,Quad PF,6+ Mos 06/02/2013, 06/07/2014  . Influenza-Unspecified 06/13/2015, 05/05/2017, 05/06/2018, 05/18/2020  . Moderna Sars-Covid-2 Vaccination 08/30/2019, 09/27/2019, 05/27/2020  . Pneumococcal Conjugate-13 01/04/2014  . Pneumococcal Polysaccharide-23 05/15/2012, 07/01/2017  . Td 09/26/2009  . Tdap 06/07/2014  . Zoster 01/09/2013  . Zoster Recombinat (Shingrix) 09/03/2017, 01/09/2018    Conditions to be addressed/monitored:  Hypertension, Hyperlipidemia, Asthma and Cognitive impairment  Care Plan : CCM Pharmacy Care Plan  Updates made by Charlton Haws, [place] since 09/14/2020 12:00 AM    Problem: Hypertension, Hyperlipidemia, Asthma and Cognitive impairment   Priority: High    Long-Range Goal: Disease management   Start Date: 09/14/2020  Expected End Date: 03/14/2021  This Visit's Progress: On track  Priority: High  Note:   Current Barriers:  . Unable to independently monitor therapeutic efficacy . Suboptimal therapeutic regimen for cognitive impairment  Pharmacist Clinical Goal(s):  Marland Kitchen Over the next 90 days, patient will achieve adherence to monitoring guidelines and medication adherence to achieve therapeutic efficacy . adhere to plan to optimize therapeutic regimen for cognitive impairment as evidenced by report of adherence to recommended medication management changes through collaboration with  PharmD and provider.  Interventions: . 1:1 collaboration with Hoyt Koch, MD regarding development and update of comprehensive plan of care as evidenced by provider attestation and co-signature . Inter-disciplinary care team collaboration (see longitudinal plan of care) . Comprehensive medication review performed; medication list updated in electronic medical record  Hypertension (BP goal < 130/80) Current regimen:  ? Losartan 50 mg daily ? Furosemide 20 mg daily Interventions: ? Discussed BP goals and benefits of medications for prevention of heart attack / stroke Patient self care activities ? Check BP daily, document, and provide at future appointments ? Ensure daily salt intake < 2300 mg/day   Hyperlipidemia (LDL goal < 100) Current regimen:  ? Pravastatin 40 mg daily ? Coenzyme Q10 100 mg daily ? Aspirin 81 mg daily ? Omega-3 FA Fish Oil 1000 mg daily Interventions: ? Discussed cholesterol goals and benefits of medications for prevention of heart attack / stroke. May consider switch to rosuvastatin if LDL above goal at next chekc Patient self care activities ? Continue medications as precribed ? Follow up with PCP as scheduled   Asthma Current regimen:  ? Albuterol 0.083% nebulizer soln twice daily as needed ? Albuterol HFA as needed ? Breo Ellipta 100-25 mcg/inh 1 puff daily ? Montelukast 10 mg daily at bedtime Interventions: ? Discussed benefits of inhalers and proper inhaler technique ? Patient's income is too high for patient assistance Patient self care activities  ? Continue current medications   Dementia/cognitive impairment . Current regimen:  o Donepezil 5 mg - 2 tab daily . Interventions: o Discussed titrate of donepezil from 5 to 10 mg after 4-6 weeks is reasonable based on manufacturer labeling . Patient self care activities - o Take Donepezil 10 mg daily  Health maintenance . Current regimen:  o Miralax PRN o Magnesium 400 mg  BID o Multivitamin o Preservision AREDS 2 o Systane eye drops o Mucinex 600 mg PRN . Interventions: o Discussed differences between AREDS and AREDS2 - AREDS 2 added 2 supplements (lutein and zeaxanthine) and removed beta-carotene (Vitamin A). Beta-carotene supplemenation has been shown to increase the risk of lung cancer in former smokers. . Patient self care activities o Continue Preservision AREDS2  Medication management Pharmacist Clinical Goal(s): ? Over the next 60 days, patient will work with PharmD and providers to maintain optimal medication adherence [facility] Interventions ? Comprehensive medication review performed. ? Continue current medication management strategy ? Updated preferred pharmacy and coordinated refills Patient self care activities  ? Focus on medication adherence by pill box ? Take medications as prescribed ? Report any questions or concerns to PharmD and/or provider(s)  Patient Goals/Self-Care Activities . Over the next 90 days, patient will:  - take medications as prescribed focus on medication adherence by pill box  Follow Up Plan: Telephone follow up appointment with care management team member scheduled for: 3 months      Medication Assistance: None required.  Patient affirms current coverage meets needs.  Patient's preferred pharmacy [facility] [place] [address] [place] [address] Phone: [phone] Fax: [phone]  Uses pill box? Yes Pt endorses 100% compliance  We discussed: Current pharmacy is preferred with insurance plan and patient is satisfied with pharmacy services - pt has just recently switched insurance plans so she has switched mail order pharmacies Patient decided to: Continue current medication management strategy  Care Plan and Follow Up Patient Decision:  Patient agrees  to Care Plan and  Follow-up.  Plan: Telephone follow up appointment with care management team member scheduled for:  3 months  Charlene Brooke, PharmD, [facility] Clinical Pharmacist [place] [facility] at [facility] [phone]

## 2020-09-14 NOTE — Patient Instructions (Signed)
Visit Information  Phone number for Pharmacist: [phone]  Goals Addressed            This Visit's Progress   . Manage My Medicine       Timeframe:  Long-Range Goal Priority:  High Start Date:      09/14/20                       Expected End Date:     03/14/21                  Follow Up Date 01/02/21   - call for medicine refill 14 days before it runs out - call if I am sick and can't take my medicine - keep a list of all the medicines I take; vitamins and herbals too - use a pillbox to sort medicine  - [facility] for all maintenance medications   Why is this important?   . These steps will help you keep on track with your medicines.   Notes: switched insurance plans at beginning of 2022 and switched [facility]      Patient Care Plan: [place]    Problem Identified: Hypertension, Hyperlipidemia, Asthma and Cognitive impairment   Priority: High    Long-Range Goal: Disease management   Start Date: 09/14/2020  Expected End Date: 03/14/2021  This Visit's Progress: On track  Priority: High  Note:   Current Barriers:  . Unable to independently monitor therapeutic efficacy . Suboptimal therapeutic regimen for cognitive impairment  Pharmacist Clinical Goal(s):  Marland Kitchen Over the next 90 days, patient will achieve adherence to monitoring guidelines and medication adherence to achieve therapeutic efficacy . adhere to plan to optimize therapeutic regimen for cognitive impairment as evidenced by report of adherence to recommended medication management changes through collaboration with PharmD and provider.   Interventions: . 1:1 collaboration with Hoyt Koch, MD regarding development and update of comprehensive plan of care as evidenced by provider attestation and co-signature . Inter-disciplinary care team collaboration (see longitudinal plan of care) . Comprehensive medication review performed; medication list updated in  electronic medical record  Hypertension (BP goal < 130/80) 1. Current regimen:  ? Losartan 50 mg daily ? Furosemide 20 mg daily 2. Interventions: ? Discussed BP goals and benefits of medications for prevention of heart attack / stroke 1. Patient self care activities - Over the next 60 days, patient will: ? Check BP daily, document, and provide at future appointments ? Ensure daily salt intake < 2300 mg/day   Hyperlipidemia (LDL goal < 100) 1. Current regimen:  ? Pravastatin 40 mg daily ? Coenzyme Q10 100 mg daily ? Aspirin 81 mg daily ? Omega-3 FA Fish Oil 1000 mg daily 1. Interventions: ? Discussed cholesterol goals and benefits of medications for prevention of heart attack / stroke. May consider switch to rosuvastatin if LDL above goal at next chekc 1. Patient self care activities - Over the next 60 days, patient will: ? Continue medications as precribed ? Follow up with PCP as scheduled   Asthma 1. Current regimen:  ? Albuterol 0.083% nebulizer soln twice daily as needed ? Albuterol HFA as needed ? Breo Ellipta 100-25 mcg/inh 1 puff daily ? Montelukast 10 mg daily at bedtime 1. Interventions: ? Discussed benefits of inhalers and proper inhaler technique ? Patient's income is too high for patient assistance 1. Patient self care activities - Over the next 60 days, patient will: ? Continue current  medications   Dementia/cognitive impairment . Current regimen:  o Donepezil 5 mg - 2 tab daily . Interventions: o Discussed titrate of donepezil from 5 to 10 mg after 4-6 weeks is reasonable based on manufacturer labeling . Patient self care activities - o Take Donepezil 10 mg daily  Health maintenance . Current regimen:  o Miralax PRN o Magnesium 400 mg BID o Multivitamin o Preservision AREDS 2 o Systane eye drops o Mucinex 600 mg PRN . Interventions: o Discussed differences between AREDS and AREDS2 - AREDS 2 added 2 supplements (lutein and zeaxanthine) and removed  beta-carotene (Vitamin A). Beta-carotene supplemenation has been shown to increase the risk of lung cancer in former smokers. . Patient self care activities o Continue Preservision AREDS2  Medication management 1. Pharmacist Clinical Goal(s): ? Over the next 60 days, patient will work with PharmD and providers to maintain optimal medication adherence 2. [facility]. Interventions ? Comprehensive medication review performed. ? Continue current medication management strategy ? Updated preferred pharmacy and coordinated refills 1. Patient self care activities - Over the next 60 days, patient will: ? Focus on medication adherence by pill box ? Take medications as prescribed ? Report any questions or concerns to PharmD and/or provider(s)  Patient Goals/Self-Care Activities . Over the next 90 days, patient will:  - take medications as prescribed focus on medication adherence by pill box  Follow Up Plan: Telephone follow up appointment with care management team member scheduled for: 3 months     The patient verbalized understanding of instructions, educational materials, and care plan provided today and declined offer to receive copy of patient instructions, educational materials, and care plan.  Telephone follow up appointment with pharmacy team member scheduled for: 3 months  Charlene Brooke, PharmD, [facility] Clinical Pharmacist [place] [facility] at [facility] [phone]

## 2020-09-15 ENCOUNTER — Other Ambulatory Visit: Payer: Self-pay | Admitting: Internal Medicine

## 2020-09-15 DIAGNOSIS — H353211 Exudative age-related macular degeneration, right eye, with active choroidal neovascularization: Secondary | ICD-10-CM | POA: Diagnosis not present

## 2020-09-15 DIAGNOSIS — H43392 Other vitreous opacities, left eye: Secondary | ICD-10-CM | POA: Diagnosis not present

## 2020-09-15 DIAGNOSIS — H43812 Vitreous degeneration, left eye: Secondary | ICD-10-CM | POA: Diagnosis not present

## 2020-09-15 DIAGNOSIS — H401122 Primary open-angle glaucoma, left eye, moderate stage: Secondary | ICD-10-CM | POA: Diagnosis not present

## 2020-09-15 DIAGNOSIS — H353121 Nonexudative age-related macular degeneration, left eye, early dry stage: Secondary | ICD-10-CM | POA: Diagnosis not present

## 2020-09-15 DIAGNOSIS — H35363 Drusen (degenerative) of macula, bilateral: Secondary | ICD-10-CM | POA: Diagnosis not present

## 2020-09-15 MED ORDER — DONEPEZIL HCL 10 MG PO TABS: 10.0000 mg | ORAL_TABLET | Freq: Every day | ORAL | 3 refills | Status: AC

## 2020-09-15 NOTE — Progress Notes (Signed)
Sent in updated dosing. Thanks

## 2020-09-20 ENCOUNTER — Other Ambulatory Visit: Payer: Self-pay | Admitting: Internal Medicine

## 2020-09-20 NOTE — Telephone Encounter (Signed)
Dx: V12.92

## 2020-10-03 DIAGNOSIS — M5136 Other intervertebral disc degeneration, lumbar region: Secondary | ICD-10-CM | POA: Diagnosis not present

## 2020-10-03 DIAGNOSIS — M5030 Other cervical disc degeneration, unspecified cervical region: Secondary | ICD-10-CM | POA: Diagnosis not present

## 2020-10-04 DIAGNOSIS — N3020 Other chronic cystitis without hematuria: Secondary | ICD-10-CM | POA: Diagnosis not present

## 2020-10-04 DIAGNOSIS — N3281 Overactive bladder: Secondary | ICD-10-CM | POA: Diagnosis not present

## 2020-10-04 DIAGNOSIS — N3941 Urge incontinence: Secondary | ICD-10-CM | POA: Diagnosis not present

## 2020-10-26 DIAGNOSIS — H353211 Exudative age-related macular degeneration, right eye, with active choroidal neovascularization: Secondary | ICD-10-CM | POA: Diagnosis not present

## 2020-11-06 ENCOUNTER — Encounter: Payer: Self-pay | Admitting: Internal Medicine

## 2020-11-08 DIAGNOSIS — U071 COVID-19: Secondary | ICD-10-CM | POA: Diagnosis not present

## 2020-11-09 DIAGNOSIS — I8311 Varicose veins of right lower extremity with inflammation: Secondary | ICD-10-CM | POA: Diagnosis not present

## 2020-11-09 DIAGNOSIS — L814 Other melanin hyperpigmentation: Secondary | ICD-10-CM | POA: Diagnosis not present

## 2020-11-09 DIAGNOSIS — D225 Melanocytic nevi of trunk: Secondary | ICD-10-CM | POA: Diagnosis not present

## 2020-11-09 DIAGNOSIS — I8312 Varicose veins of left lower extremity with inflammation: Secondary | ICD-10-CM | POA: Diagnosis not present

## 2020-11-09 DIAGNOSIS — D2371 Other benign neoplasm of skin of right lower limb, including hip: Secondary | ICD-10-CM | POA: Diagnosis not present

## 2020-11-09 DIAGNOSIS — L821 Other seborrheic keratosis: Secondary | ICD-10-CM | POA: Diagnosis not present

## 2020-11-09 DIAGNOSIS — I872 Venous insufficiency (chronic) (peripheral): Secondary | ICD-10-CM | POA: Diagnosis not present

## 2020-11-09 DIAGNOSIS — Z85828 Personal history of other malignant neoplasm of skin: Secondary | ICD-10-CM | POA: Diagnosis not present

## 2020-11-10 MED ORDER — ALENDRONATE SODIUM 70 MG PO TABS: 70.0000 mg | ORAL_TABLET | ORAL | 3 refills | Status: AC

## 2020-11-27 DIAGNOSIS — H90A32 Mixed conductive and sensorineural hearing loss, unilateral, left ear with restricted hearing on the contralateral side: Secondary | ICD-10-CM | POA: Diagnosis not present

## 2020-11-27 DIAGNOSIS — H903 Sensorineural hearing loss, bilateral: Secondary | ICD-10-CM | POA: Diagnosis not present

## 2020-11-27 DIAGNOSIS — H90A21 Sensorineural hearing loss, unilateral, right ear, with restricted hearing on the contralateral side: Secondary | ICD-10-CM | POA: Diagnosis not present

## 2020-11-30 DIAGNOSIS — H353211 Exudative age-related macular degeneration, right eye, with active choroidal neovascularization: Secondary | ICD-10-CM | POA: Diagnosis not present

## 2020-12-11 ENCOUNTER — Ambulatory Visit (INDEPENDENT_AMBULATORY_CARE_PROVIDER_SITE_OTHER): Payer: Medicare Other | Admitting: Pharmacist

## 2020-12-11 ENCOUNTER — Other Ambulatory Visit: Payer: Self-pay

## 2020-12-11 DIAGNOSIS — M810 Age-related osteoporosis without current pathological fracture: Secondary | ICD-10-CM | POA: Diagnosis not present

## 2020-12-11 DIAGNOSIS — E7800 Pure hypercholesterolemia, unspecified: Secondary | ICD-10-CM

## 2020-12-11 DIAGNOSIS — J4530 Mild persistent asthma, uncomplicated: Secondary | ICD-10-CM | POA: Diagnosis not present

## 2020-12-11 DIAGNOSIS — F09 Unspecified mental disorder due to known physiological condition: Secondary | ICD-10-CM

## 2020-12-11 DIAGNOSIS — I10 Essential (primary) hypertension: Secondary | ICD-10-CM | POA: Diagnosis not present

## 2020-12-11 NOTE — Progress Notes (Signed)
Chronic Care Management Pharmacy Note  12/11/2020 Name:  Lisa Hamilton MRN:  [id number] DOB:  [birth year]  Subjective: Lisa Hamilton is an 75 y.o. year old female who is a primary patient of Hoyt Koch, MD.  The CCM team was consulted for assistance with disease management and care coordination needs.    Engaged with patient by telephone for follow up visit in response to provider referral for pharmacy case management and/or care coordination services.   Consent to Services:  The patient was given information about Chronic Care Management services, agreed to services, and gave verbal consent prior to initiation of services.  Please see initial visit note for detailed documentation.   Patient Care Team: Hoyt Koch, MD as PCP - General (Internal Medicine) Irine Seal, MD (Urology) Rolm Bookbinder, MD (Dermatology) Rutherford Guys, MD (Ophthalmology) Eugene, Romilda Garret, [place] (Podiatry) Deneise Lever, MD (Pulmonary Disease) Milus Banister, MD (Gastroenterology) Jerelyn Charles, MD as Consulting Physician (Obstetrics) Edrick Kins, DPM as Consulting Physician (Podiatry) Jerelyn Charles, MD as Consulting Physician (Obstetrics) Suella Broad, MD as Consulting Physician (Physical Medicine and Rehabilitation) Charlton Haws, [facility] as Pharmacist (Pharmacist) Jalene Mullet, MD as Consulting Physician (Ophthalmology)   Patient lives at home with her husband.  12/11/20: she complains of strong-smelling urine. She reports a history of chronic/frequent UTIs. She reports her only symptom currently is the smell, denies frequency and dysuria. She has a message in to her urologist and is waiting to hear back.  Recent office visits: 07/22/20 Dr Sharlet Salina OV: chronic f/u. Added donepezil 5 mg for mild cognitive impairment.  Recent consult visits: 11/27/20 [facility] Audiology: hearing aid candidate 02/10/20 Dr Gershon Crane [facility] eye care): f/u for macular degeneration  12/30/19 Dr Annamaria Boots  (pulmonary): f/u for asthma, chronic resp failure, RLS. Consider Trelegy or Breo. Ordered portable O2 concentrator.   11/05/19 Dr Doran Durand (ortho): f/u for pain in feet, osteoarthritis, lymphedema.  09/16/19 Dr Ubaldo Glassing (dermatology): f/u for skin [facility] visits: None in previous 6 months  Objective:  Lab Results  Component Value Date   CREATININE 0.43 07/24/2020   BUN 21 07/24/2020   GFR 96.72 07/24/2020   GFRNONAA >60 12/11/2017   GFRAA >60 12/11/2017   NA 143 07/24/2020   K 3.6 07/24/2020   CALCIUM 8.9 07/24/2020   CO2 34 (H) 07/24/2020    Lab Results  Component Value Date/Time   HGBA1C 5.9 07/24/2020 10:38 AM   HGBA1C 5.9 01/08/2019 09:24 AM   GFR 96.72 07/24/2020 10:38 AM   GFR 130.11 11/16/2019 10:30 AM    Last diabetic Eye exam: No results found for: HMDIABEYEEXA  Last diabetic Foot exam: No results found for: HMDIABFOOTEX   Lab Results  Component Value Date   CHOL 192 07/24/2020   HDL 36.10 (L) 07/24/2020   LDLCALC 119 (H) 07/24/2020   TRIG 185.0 (H) 07/24/2020   CHOLHDL 5 07/24/2020    Hepatic Function Latest Ref Rng & Units 07/24/2020 11/16/2019 01/08/2019  Total Protein 6.0 - 8.3 g/dL 6.6 6.7 6.7  Albumin 3.5 - 5.2 g/dL 3.8 4.0 4.0  AST 0 - 37 U/L $Remo'18 18 15  'wEQmf$ ALT 0 - 35 U/L $Remo'19 18 16  'RAcgr$ Alk Phosphatase 39 - 117 U/L 83 91 88  Total Bilirubin 0.2 - 1.2 mg/dL 0.5 0.5 0.5  Bilirubin, Direct 0.01 - 0.4 mg/dL - - -    Lab Results  Component Value Date/Time   TSH 1.18 07/24/2020 10:38 AM   TSH 3.26 08/30/2015 08:53 AM  FREET4 1.06 08/30/2015 08:53 AM    CBC Latest Ref Rng & Units 07/24/2020 11/16/2019 01/08/2019  WBC 4.0 - 10.5 K/uL 5.8 5.3 5.6  Hemoglobin 12.0 - 15.0 g/dL 13.4 14.5 14.7  Hematocrit 36.0 - 46.0 % 41.0 44.5 43.9  Platelets 150.0 - 400.0 K/uL 216.0 232.0 222.0    Lab Results  Component Value Date/Time   VD25OH 27.55 (L) 07/24/2020 10:38 AM    Clinical ASCVD: No  The 10-year ASCVD risk score Mikey Bussing DC Jr., et al., 2013) is: 29.5%    Values used to calculate the score:     Age: 7 years     Sex: Female     Is Non-Hispanic African American: No     Diabetic: No     Tobacco smoker: No     Systolic Blood Pressure: 518 mmHg     Is BP treated: Yes     HDL Cholesterol: 36.1 mg/dL     Total Cholesterol: 192 mg/dL    Depression screen [facility] 2/9 07/24/2020 07/23/2019 01/08/2019  Decreased Interest 0 0 0  Down, Depressed, Hopeless 0 1 1  PHQ - 2 Score 0 1 1  Altered sleeping - 2 -  Tired, decreased energy - 1 -  Change in appetite - 0 -  Feeling bad or failure about yourself  - 0 -  Trouble concentrating - 0 -  Moving slowly or fidgety/restless - 0 -  Suicidal thoughts - 0 -  PHQ-9 Score - 4 -  Difficult doing work/chores - Not difficult at all -    Social History   Tobacco Use  Smoking Status Never Smoker  Smokeless Tobacco Never Used   BP Readings from Last 3 Encounters:  07/24/20 120/70  07/24/20 120/78  07/06/20 126/84   Pulse Readings from Last 3 Encounters:  07/24/20 68  07/24/20 68  07/06/20 83   Wt Readings from Last 3 Encounters:  07/24/20 226 lb (102.5 kg)  07/24/20 226 lb (102.5 kg)  07/06/20 231 lb 8 oz (105 kg)    Assessment/Interventions: Review of patient past medical history, allergies, medications, health status, including review of consultants reports, laboratory and other test data, was performed as part of comprehensive evaluation and provision of chronic care management services.   SDOH:  (Social Determinants of Health) assessments and interventions performed: Yes   CCM Care Plan  Allergies  Allergen Reactions  . Hydromorphone Hcl Other (See Comments)    Disorientation and delirium.   Marland Kitchen Penicillins Rash and Other (See Comments)    PATIENT HAS HAD A PCN REACTION WITH IMMEDIATE RASH, FACIAL/TONGUE/THROAT SWELLING, SOB, OR LIGHTHEADEDNESS WITH HYPOTENSION:  #  #  YES  #  #  Has patient had a PCN reaction causing severe rash involving mucus membranes or skin necrosis: No Has patient  had a PCN reaction that required hospitalization: No Has patient had a PCN reaction occurring within the last 10 years: No If all of the above answers are "NO", then may proceed with Cephalosporin use.   . Adhesive [Tape] Rash and Other (See Comments)    Blisters The clear tape   . Amoxicillin-Pot Clavulanate Rash  . Codeine Other (See Comments)    nightmares    Medications Reviewed Today    Reviewed by Charlton Haws, [facility] (Pharmacist) on 12/11/20 at 1643  Med List Status: <None>  Medication Order Taking? Sig Documenting Provider Last Dose Status Informant  acetaminophen (TYLENOL) 500 MG tablet [id number] Yes Take 1,000 mg by mouth 3 (three) times daily as  needed for moderate pain or headache. [provider] Taking Active Self           Med Note Nat Christen   Tue Sep 01, 2018  1:09 PM)    albuterol (PROVENTIL) (2.5 MG/3ML) 0.083% nebulizer solution [id number] Yes USE 1 VIAL IN NEBULIZER 4 TIMES DAILY. Generic: Caryl Never, MD Taking Active   albuterol (VENTOLIN HFA) 108 (90 Base) MCG/ACT inhaler [id number] Yes USE 1 TO 2 INHALATIONS BY  MOUTH EVERY 6 HOURS AS  NEEDED FOR WHEEZING OR  SHORTNESS OF BREATH Young, Kasandra Knudsen, MD Taking Active   alendronate (FOSAMAX) 70 MG tablet [id number] Yes Take 1 tablet (70 mg total) by mouth every 7 (seven) days. Take with a full glass of water on an empty stomach. Hoyt Koch, MD Taking Active   aspirin EC 81 MG tablet [id number] Yes Take 162 mg by mouth at bedtime. [provider] Taking Active Self  Calcium Carb-Cholecalciferol 600-800 MG-UNIT TABS [id number] Yes Take 1 tablet by mouth 2 (two) times daily. [provider] Taking Active Self  Cholecalciferol (VITAMIN D) 2000 units tablet [id number] Yes Take 2,000 Units by mouth daily. [provider] Taking Active Self  clonazePAM (KLONOPIN) 0.5 MG tablet [id number] Yes TAKE 1 TO 3 TABLETS BY  MOUTH 30 MINUTES BEFORE  BEDTIME Baird Lyons D,  MD Taking Active   Co-Enzyme Q-10 100 MG CAPS [id number] Yes Take 100 mg by mouth daily. [provider] Taking Active Self  Cranberry 180 MG CAPS [id number] Yes Take 1 capsule by mouth daily. [provider] Taking Active   diclofenac Sodium (VOLTAREN) 1 % GEL [id number] Yes  [provider] Taking Active   donepezil (ARICEPT) 10 MG tablet [id number] Yes Take 1 tablet (10 mg total) by mouth at bedtime. Hoyt Koch, MD Taking Active   fluticasone [facility]) 50 MCG/ACT nasal spray [id number] Yes Place 2 sprays into both nostrils daily. Hoyt Koch, MD Taking Active   fluticasone furoate-vilanterol (BREO ELLIPTA) 100-25 MCG/INH AEPB [id number] Yes Inhale 1 puff then rinse mouth,once daily Baird Lyons D, MD Taking Active   furosemide (LASIX) 20 MG tablet [id number] Yes Take 1 tablet (20 mg total) by mouth daily.  Patient taking differently: Take 20 mg by mouth daily as needed.   Deneise Lever, MD Taking Active   gabapentin (NEURONTIN) 300 MG capsule [id number] Yes Take 1 capsule (300 mg total) by mouth 3 (three) times daily. Deneise Lever, MD Taking Active   guaiFENesin (MUCINEX) 600 MG 12 hr tablet [id number] Yes Take 600 mg by mouth 2 (two) times daily as needed (congestion).  [provider] Taking Active Self  ibuprofen (ADVIL,MOTRIN) 200 MG tablet [id number] Yes Take 400 mg by mouth 3 (three) times daily as needed for headache or moderate pain. [provider] Taking Active Self           Med Note Nat Christen   Tue Sep 01, 2018  1:09 PM)    losartan (COZAAR) 50 MG tablet [id number] Yes Take 1 tablet (50 mg total) by mouth daily. Hoyt Koch, MD Taking Active   Magnesium 400 MG CAPS [id number] Yes Take 400 mg by mouth 2 (two) times daily.  [provider] Taking Active Self  montelukast (SINGULAIR) 10 MG tablet [id number] Yes Take 1 tablet (10 mg total) by mouth every evening. Deneise Lever, MD Taking Active    Multiple Vitamins-Minerals (PRESERVISION AREDS 2) CAPS [id number] Yes Take 1 capsule by mouth  2 (two) times daily. [provider] Taking Active Self  Omega-3 Fatty Acids (FISH OIL) 1200 MG CAPS [id number] Yes Take 1,200 mg by mouth daily. [provider] Taking Active Self  OXYGEN [id number] Yes Inhale 2 L into the lungs at bedtime.  [provider] Taking Active Self  polyethylene glycol (MIRALAX / GLYCOLAX) packet [id number] Yes Take 17 g by mouth 3 (three) times daily as needed for moderate constipation. [provider] Taking Active Self  pravastatin (PRAVACHOL) 40 MG tablet [id number] Yes Take 1 tablet (40 mg total) by mouth daily. Hoyt Koch, MD Taking Active   Propylene Glycol [facility] BALANCE OP) [id number] Yes Place 1 drop into both eyes daily as needed (dry eyes). [provider] Taking Active Self  rOPINIRole (REQUIP) 0.5 MG tablet [id number] Yes Take up to 6 tablets by mouth daily as needed Baird Lyons D, MD Taking Active   traMADol (ULTRAM) 50 MG tablet [id number] Yes Take 1 tablet (50 mg total) by mouth daily as needed. Hoyt Koch, MD Taking Active Self  Turmeric 500 MG CAPS [id number] Yes Take 1 capsule by mouth daily. [provider] Taking Active   vitamin B-12 (CYANOCOBALAMIN) 1000 MCG tablet [id number] Yes Take 1,000 mcg by mouth daily. [provider] Taking Active           Patient Active Problem List   Diagnosis Date Noted  . Mild cognitive disorder 07/24/2020  . Peripheral edema 02/12/2020  . History of colonic polyps   . Chronic respiratory failure with hypoxia ([place]) 03/04/2018  . Nephrolithiasis 10/15/2017  . Anemia 12/22/2015  . Hyperglycemia 01/04/2014  . NONTOXIC MULTINODULAR GOITER 08/06/2008  . HYPERCHOLESTEROLEMIA 08/06/2008  . RESTLESS LEG SYNDROME 08/06/2008  . Essential hypertension 08/06/2008  . Diverticulosis of colon without hemorrhage 08/06/2008  . Liver cyst s/p partial  hepatectomy 12/04/2017 08/06/2008  . MEDULLARY SPONGE KIDNEY 08/06/2008  . VITAMIN D DEFICIENCY 10/21/2007  . Morbid obesity ([place]) 10/21/2007  . GLAUCOMA 10/21/2007  . Asthma in adult, mild persistent, uncomplicated 94/49/6759  . Osteoarthritis 10/21/2007  . Osteoporosis 10/21/2007    Immunization History  Administered Date(s) Administered  . H1N1 07/27/2008  . Influenza Split 05/16/2011  . Influenza, High Dose Seasonal PF 06/13/2015, 05/11/2016, 05/06/2018, 05/11/2019  . Influenza,inj,Quad PF,6+ Mos 06/02/2013, 06/07/2014  . Influenza-Unspecified 06/13/2015, 05/05/2017, 05/06/2018, 05/18/2020  . Moderna Sars-Covid-2 Vaccination 08/30/2019, 09/27/2019, 05/27/2020  . Pneumococcal Conjugate-13 01/04/2014  . Pneumococcal Polysaccharide-23 05/15/2012, 07/01/2017  . Td 09/26/2009  . Tdap 06/07/2014  . Zoster 01/09/2013  . Zoster Recombinat (Shingrix) 09/03/2017, 01/09/2018    Conditions to be addressed/monitored:  Hypertension, Hyperlipidemia, Asthma and Cognitive impairment  Patient Care Plan: CCM Pharmacy Care Plan    Problem Identified: Hypertension, Hyperlipidemia, Asthma, Osteoporosis and Cognitive impairment   Priority: High    Long-Range Goal: Disease management   Start Date: 09/14/2020  Expected End Date: 06/13/2021  Recent Progress: On track  Priority: High  Note:   Current Barriers:  . Unable to independently monitor therapeutic efficacy . Unable to maintain control of strong-smelling urine  Pharmacist Clinical Goal(s):  Marland Kitchen Patient will achieve adherence to monitoring guidelines and medication adherence to achieve therapeutic efficacy . adhere to plan to optimize therapeutic regimen for cognitive impairment as evidenced by report of adherence to recommended medication management changes through collaboration with PharmD and provider.   Interventions: . 1:1 collaboration with Hoyt Koch, MD regarding development and update of comprehensive plan of care as  evidenced by provider attestation and co-signature . Inter-disciplinary  care team collaboration (see longitudinal plan of care) . Comprehensive medication review performed; medication list updated in electronic medical record  Hypertension (BP goal < 130/80) Controlled Current regimen:  ? Losartan 50 mg daily ? Furosemide 20 mg daily Interventions: ? Discussed BP goals and benefits of medications for prevention of heart attack / stroke ? Recommend to continue current medication   Hyperlipidemia (LDL goal < 100) Controlled  Current regimen:  ? Pravastatin 40 mg daily ? Coenzyme Q10 100 mg daily ? Aspirin 81 mg daily ? Omega-3 FA Fish Oil 1000 mg BID Interventions: ? Discussed cholesterol goals and benefits of medications for prevention of heart attack / stroke. May consider switch to rosuvastatin if LDL above goal at next check ? Advised to reduce Fish Oil to once daily to see if it helps with strong-smelling urine   Asthma Controlled Current regimen:  ? Albuterol 0.083% nebulizer soln twice daily as needed ? Albuterol HFA as needed ? Breo Ellipta 100-25 mcg/inh 1 puff daily ? Montelukast 10 mg daily at bedtime Interventions: ? Discussed benefits of inhalers and proper inhaler technique ? Patient's income is too high for patient assistance Patient self care activities  ? Continue current medications   Dementia/cognitive impairment Controlled  - pt is adherent and denies side effects Current regimen:  o Donepezil 10 mg daily Interventions: o Discussed benefits of donepezil - delayed progression of symptoms o Recommend to continue current medication  Osteoporosis (Goal prevent fractures) -Controlled -Last DEXA Scan: 10/2019   T-Score femoral neck: -1.7  T-Score total hip: -1.6  T-Score forearm radius: -3.2 -Patient is a candidate for pharmacologic treatment due to T-Score < -2.5 -Current treatment  . Alendronate 70 mg weekly - Tuesdays . Calcium 600 mg BID . Vitamin  D 2000 IU daily -Recommend [phone] units of vitamin D daily. Recommend 1200 mg of calcium daily from dietary and supplemental sources. Counseled on oral bisphosphonate administration: take in the morning, 30 minutes prior to food with 6-8 oz of water. Do not lie down for at least 30 minutes after taking.  Health maintenance . Current regimen:  o Miralax PRN o Magnesium 400 mg BID o Multivitamin o Preservision AREDS 2 o Systane eye drops o Mucinex 600 mg PRN o Turmeric 500 mg BID o Prevagen . Interventions: o Discussed differences between AREDS and AREDS2 - AREDS 2 added 2 supplements (lutein and zeaxanthine) and removed beta-carotene (Vitamin A). Beta-carotene supplemenation has been shown to increase the risk of lung cancer in former smokers. Advised to continue Preservision Areds 2 o Counseled on possibility of turmeric leading to strong-smelling urine. Advised to reduce turmeric to once daily dosing o Counseled on questionable benefits of prevagen; pt has also discussed with neurologist and concluded there is little research to support its use and it is very costly; patient plans to stop when she finishes her current supply  Patient Goals/Self-Care Activities . Patient will:  - take medications as prescribed focus on medication adherence by pill box -Reduce Fish Oil and Turmeric to once daily and monitor urine  Follow Up Plan: Telephone follow up appointment with care management team member scheduled for: 3 months     Medication Assistance: None required.  Patient affirms current coverage meets needs.  Patient's preferred pharmacy is:  [facility], [place] [address] [address] Phone: [phone] Fax: [place], [place] -  [address] Melina Modena [address] Fredderick [place] [address] Phone: [phone] Fax:  [phone]  Uses pill box? Yes Pt endorses 100% compliance  We discussed: Current pharmacy is preferred with insurance plan and patient is satisfied with pharmacy services - pt has just recently switched insurance plans so she has switched mail order pharmacies Patient decided to: Continue current medication management strategy  Care Plan and Follow Up Patient Decision:  Patient agrees to Care Plan and Follow-up.  Plan: Telephone follow up appointment with care management team member scheduled for:  3 months  Charlene Brooke, PharmD, [facility] Clinical Pharmacist [facility] at [facility] [phone]

## 2020-12-11 NOTE — Patient Instructions (Signed)
Visit Information  Phone number for Pharmacist: [phone]  Goals Addressed            This Visit's Progress   . Manage My Medicine       Timeframe:  Long-Range Goal Priority:  High Start Date:      09/14/20                       Expected End Date:     03/14/21                  Follow Up Date 01/02/21   - call for medicine refill 14 days before it runs out - call if I am sick and can't take my medicine - keep a list of all the medicines I take; vitamins and herbals too - use a pillbox to sort medicine  - [facility] for all maintenance medications - If alendronate is forgotten on Tuesday, may take on Wednesday - Reduce turmeric and fish oil to once daily   Why is this important?   . These steps will help you keep on track with your medicines.   Notes: switched insurance plans at beginning of 2022 and [facility]       Patient verbalizes understanding of instructions provided today and agrees to view in [place].  Telephone follow up appointment with pharmacy team member scheduled for: 3 months  Charlene Brooke, PharmD, [place], CPP Clinical Pharmacist [facility] at [facility] [phone]

## 2020-12-18 DIAGNOSIS — U071 COVID-19: Secondary | ICD-10-CM | POA: Diagnosis not present

## 2020-12-26 DIAGNOSIS — M47816 Spondylosis without myelopathy or radiculopathy, lumbar region: Secondary | ICD-10-CM | POA: Diagnosis not present

## 2021-01-03 NOTE — Progress Notes (Signed)
HPI female never smoker followed for Restless Legs, back pain that disturbs sleep, Asthma, Chronic Resp Failure w Hypoxia, complicated by peripheral venous disease, hemochromatosis/ Fe def anemia, Walk Test O2 Qualifying 11/16/19- desat to 88% on RA with max HR 106. O2 2L raised sat to 95%.  PFT 12/30/19- Moderate Restriction, Severe Obstruction, response to BD, Mild DLCO reduction Doppler leg veins 11/29/19- Normal ---------------------------------------------------------------------   07/06/20- 75 year old female never smoker followed for Restless Legs, back pain that disturbs sleep, Asthma, Chronic Resp Failure w Hypoxia, complicated by peripheral venous disease, hemochromatosis/ Fe def anemia, O2 was started with hospitalization after abdominal surgeries for partial bowel obstruction, hernia repair and hepatic cyst resection May, 2019, + Glaucoma, O2 3L sleep, 2L prn / Adapt sleep and prn   Using a self-fill system -Requip 0.5 mg/ 6 daily, Neurontin 300 mg tid, clonazepam 0.5 mg 1-3 hs, neb albuterol, Singulair, Breo 100,   Furosemide 20 mg Covid vax-3 Moderna Flu vax-had -----6 month follow up on Asthma ACT score 17 O2 sat varies at home but usually low 90's.  Planning return to [facility] for exercise, but activity limited by low back pain/ sciatica.  01/04/21-  75 year old female never smoker followed for Restless Legs, back pain that disturbs sleep, Asthma, Chronic Resp Failure w Hypoxia, complicated by peripheral venous disease, hemochromatosis/ Fe def anemia, O2 was started with hospitalization after abdominal surgeries for partial bowel obstruction, hernia repair and hepatic cyst resection May, 2019, + Glaucoma, O2 3L sleep, 2L prn / Adapt sleep and prn   Using a self-fill system -Requip 0.5 mg/ 6 daily, Neurontin 300 mg tid, clonazepam 0.5 mg 1-3 hs, neb albuterol, Singulair, Breo 100,   Furosemide 20 mg Covid vax-3 Moderna -----=Using rescue more due to heat, more wheezing from heat Rescue  hfa 3x/ day, nebulizer 4x/ day O2 sat 96% on 2L Discussed med use and 4th Covid vax.  ROS-see HPI + = positive Constitutional:   No-   weight loss, night sweats, fevers, chills, +fatigue, lassitude. HEENT:   No-  headaches, difficulty swallowing, tooth/dental problems, sore throat,       No-  sneezing, itching, ear ache, nasal congestion, post nasal drip,  CV:  No-   chest pain, orthopnea, PND,+ swelling in lower extremities, anasarca,                                                     dizziness, palpitations Resp: + shortness of breath with exertion or at rest.              + productive cough,  No non-productive cough,  No- coughing up of blood.              No-   change in color of mucus. + wheezing.   Skin: No-   rash or lesions. GI:  No-   heartburn, indigestion, abdominal pain, nausea, vomiting,  GU:     MS:  No-   joint pain or swelling.  No- decreased range of motion.  + back pain, leg pain. Neuro-     + per HPI Psych:  No- change in mood or affect. No depression or anxiety.  No memory loss.  OBJ- Physical Exam General- Alert, Oriented, Affect-appropriate, Distress- none acute, +obese, +POC 2L, Skin- rash-none, lesions- none, excoriation- none Lymphadenopathy- none Head- atraumatic  Eyes- Gross vision intact, PERRLA, conjunctivae and secretions clear            Ears- Hearing, canals-normal            Nose- Clear, no-Septal dev, mucus, polyps, erosion, perforation             Throat- Mallampati II-III , mucosa clear , drainage- none, tonsils- atrophic Neck- +cervical scoliosis , trachea midline, no stridor , thyroid nl, carotid no bruit Chest - symmetrical excursion , unlabored           Heart/CV- RRR , no murmur , no gallop  , no rub, nl s1 s2                           - JVD none , edema +2, stasis changes- none,  superficial varices +           Lung- clear to P&A, wheeze- none, cough- none , dullness-none, rub- none           Chest wall- +kyphoscoliosis Abd-  Br/  Gen/ Rectal- Not done, not indicated Extrem- +compression hose legs, +heavy legs, +L calf> R. Neuro- grossly intact to observation, no unusual restlessness or movement, +cane

## 2021-01-04 ENCOUNTER — Other Ambulatory Visit: Payer: Self-pay

## 2021-01-04 ENCOUNTER — Ambulatory Visit (INDEPENDENT_AMBULATORY_CARE_PROVIDER_SITE_OTHER): Payer: Medicare Other | Admitting: Internal Medicine

## 2021-01-04 ENCOUNTER — Ambulatory Visit (INDEPENDENT_AMBULATORY_CARE_PROVIDER_SITE_OTHER): Payer: Medicare Other

## 2021-01-04 ENCOUNTER — Encounter: Payer: Self-pay | Admitting: Internal Medicine

## 2021-01-04 DIAGNOSIS — R0902 Hypoxemia: Secondary | ICD-10-CM | POA: Diagnosis not present

## 2021-01-04 DIAGNOSIS — J4530 Mild persistent asthma, uncomplicated: Secondary | ICD-10-CM

## 2021-01-04 DIAGNOSIS — J9611 Chronic respiratory failure with hypoxia: Secondary | ICD-10-CM

## 2021-01-04 DIAGNOSIS — I517 Cardiomegaly: Secondary | ICD-10-CM | POA: Diagnosis not present

## 2021-01-04 DIAGNOSIS — J9690 Respiratory failure, unspecified, unspecified whether with hypoxia or hypercapnia: Secondary | ICD-10-CM | POA: Diagnosis not present

## 2021-01-04 MED ORDER — BREO ELLIPTA 100-25 MCG/INH IN AEPB: INHALATION_SPRAY | RESPIRATORY_TRACT | 4 refills | Status: AC

## 2021-01-04 NOTE — Patient Instructions (Signed)
Ok to set O2 on 3.5 l for sleep, and ok to run your O2 hose into your shower.  Order- CXR    Dx chronic respiratory failure with hypoxia  Script printed for Breo  Please call if we can help

## 2021-01-05 DIAGNOSIS — H353211 Exudative age-related macular degeneration, right eye, with active choroidal neovascularization: Secondary | ICD-10-CM | POA: Diagnosis not present

## 2021-01-08 ENCOUNTER — Telehealth: Payer: Self-pay | Admitting: Pharmacist

## 2021-01-08 NOTE — Chronic Care Management (AMB) (Signed)
Chronic Care Management Pharmacy Assistant   Name: COLLIER MONICA  MRN: [id number] DOB: 1946/07/30   Reason for Encounter: Disease State Hypertension Call   Conditions to be addressed/monitored: HTN  Recent office visits:  None ID  Recent consult visits:  01/04/21 Dr. Cristy Hilts visits:  None in previous 6 months  Medications: Outpatient Encounter Medications as of 01/08/2021  Medication Sig  . acetaminophen (TYLENOL) 500 MG tablet Take 1,000 mg by mouth 3 (three) times daily as needed for moderate pain or headache.  . albuterol (PROVENTIL) (2.5 MG/3ML) 0.083% nebulizer solution USE 1 VIAL IN NEBULIZER 4 TIMES DAILY. Generic: VENTOLIN  . albuterol (VENTOLIN HFA) 108 (90 Base) MCG/ACT inhaler USE 1 TO 2 INHALATIONS BY  MOUTH EVERY 6 HOURS AS  NEEDED FOR WHEEZING OR  SHORTNESS OF BREATH  . alendronate (FOSAMAX) 70 MG tablet Take 1 tablet (70 mg total) by mouth every 7 (seven) days. Take with a full glass of water on an empty stomach.  Marland Kitchen aspirin EC 81 MG tablet Take 162 mg by mouth at bedtime.  . Calcium Carb-Cholecalciferol 600-800 MG-UNIT TABS Take 1 tablet by mouth 2 (two) times daily.  . Cholecalciferol (VITAMIN D) 2000 units tablet Take 2,000 Units by mouth daily.  . clonazePAM (KLONOPIN) 0.5 MG tablet TAKE 1 TO 3 TABLETS BY  MOUTH 30 MINUTES BEFORE  BEDTIME  . Co-Enzyme Q-10 100 MG CAPS Take 100 mg by mouth daily.  . Cranberry 180 MG CAPS Take 1 capsule by mouth daily.  . diclofenac Sodium (VOLTAREN) 1 % GEL   . donepezil (ARICEPT) 10 MG tablet Take 1 tablet (10 mg total) by mouth at bedtime.  . fluticasone (FLONASE) 50 MCG/ACT nasal spray Place 2 sprays into both nostrils daily.  . fluticasone furoate-vilanterol (BREO ELLIPTA) 100-25 MCG/INH AEPB Inhale 1 puff then rinse mouth,once daily  . furosemide (LASIX) 20 MG tablet Take 1 tablet (20 mg total) by mouth daily. (Patient taking differently: Take 20 mg by mouth daily as needed.)  . gabapentin (NEURONTIN) 300 MG  capsule Take 1 capsule (300 mg total) by mouth 3 (three) times daily.  Marland Kitchen guaiFENesin (MUCINEX) 600 MG 12 hr tablet Take 600 mg by mouth 2 (two) times daily as needed (congestion).   Marland Kitchen ibuprofen (ADVIL,MOTRIN) 200 MG tablet Take 400 mg by mouth 3 (three) times daily as needed for headache or moderate pain.  Marland Kitchen losartan (COZAAR) 50 MG tablet Take 1 tablet (50 mg total) by mouth daily.  . Magnesium 400 MG CAPS Take 400 mg by mouth 2 (two) times daily.   . montelukast (SINGULAIR) 10 MG tablet Take 1 tablet (10 mg total) by mouth every evening.  . Multiple Vitamins-Minerals (PRESERVISION AREDS 2) CAPS Take 1 capsule by mouth 2 (two) times daily.  . Omega-3 Fatty Acids (FISH OIL) 1200 MG CAPS Take 1,200 mg by mouth daily.  . OXYGEN Inhale 2 L into the lungs at bedtime.   . polyethylene glycol (MIRALAX / GLYCOLAX) packet Take 17 g by mouth 3 (three) times daily as needed for moderate constipation.  . pravastatin (PRAVACHOL) 40 MG tablet Take 1 tablet (40 mg total) by mouth daily.  Marland Kitchen Propylene Glycol (SYSTANE BALANCE OP) Place 1 drop into both eyes daily as needed (dry eyes).  Marland Kitchen rOPINIRole (REQUIP) 0.5 MG tablet Take up to 6 tablets by mouth daily as needed  . traMADol (ULTRAM) 50 MG tablet Take 1 tablet (50 mg total) by mouth daily as needed.  . Turmeric 500  MG CAPS Take 1 capsule by mouth daily.  . vitamin B-12 (CYANOCOBALAMIN) 1000 MCG tablet Take 1,000 mcg by mouth daily.   No facility-administered encounter medications on file as of 01/08/2021.   Pharmacist Review  Reviewed chart prior to disease state call. Spoke with patient regarding BP  Recent Office Vitals: BP Readings from Last 3 Encounters:  01/04/21 118/74  07/24/20 120/70  07/24/20 120/78   Pulse Readings from Last 3 Encounters:  01/04/21 63  07/24/20 68  07/24/20 68    Wt Readings from Last 3 Encounters:  01/04/21 217 lb 9.6 oz (98.7 kg)  07/24/20 226 lb (102.5 kg)  07/24/20 226 lb (102.5 kg)     Kidney Function Lab  Results  Component Value Date/Time   CREATININE 0.43 07/24/2020 10:38 AM   CREATININE 0.47 11/16/2019 10:30 AM   GFR 96.72 07/24/2020 10:38 AM   GFRNONAA >60 12/11/2017 07:36 AM   GFRAA >60 12/11/2017 07:36 AM    BMP Latest Ref Rng & Units 07/24/2020 11/16/2019 01/08/2019  Glucose 70 - 99 mg/dL 87 102(H) 91  BUN 6 - 23 mg/dL 21 16 21   Creatinine 0.40 - 1.20 mg/dL 0.43 0.47 0.52  Sodium 135 - 145 mEq/L 143 141 143  Potassium 3.5 - 5.1 mEq/L 3.6 3.9 4.3  Chloride 96 - 112 mEq/L 104 104 105  CO2 19 - 32 mEq/L 34(H) 31 30  Calcium 8.4 - 10.5 mg/dL 8.9 9.1 9.1    . Current antihypertensive regimen:  Losartan 50 mg daily  . How often are you checking your Blood Pressure? Patient states that she does not take home blood pressure  . Current home BP readings: Patient last blood pressure reading was 118/74 at Dr. Janee Morn office  . What recent interventions/DTPs have been made by any provider to improve Blood Pressure control since last CPP Visit: Dicussed blood pressure goals and to cntinue current medications   . Any recent hospitalizations or ED visits since last visit with CPP? Yes   . What diet changes have been made to improve Blood Pressure Control?  The patient states that she is eating more fruits and vegetables  . What exercise is being done to improve your Blood Pressure Control?  Patient states that she can't exercise because she has scoliosis and has to use a walker  Adherence Review: Is the patient currently on ACE/ARB medication? Yes Does the patient have >5 day gap between last estimated fill dates? No   Star Rating Drugs: Losartan 5/08/06/20 90 d Pravastatin 12/03/20 90 d  [place] Pharmacist Assistant [phone]  Time spent:40

## 2021-01-31 DIAGNOSIS — M5136 Other intervertebral disc degeneration, lumbar region: Secondary | ICD-10-CM | POA: Diagnosis not present

## 2021-01-31 DIAGNOSIS — M47816 Spondylosis without myelopathy or radiculopathy, lumbar region: Secondary | ICD-10-CM | POA: Diagnosis not present

## 2021-02-08 DIAGNOSIS — H353211 Exudative age-related macular degeneration, right eye, with active choroidal neovascularization: Secondary | ICD-10-CM | POA: Diagnosis not present

## 2021-02-12 DIAGNOSIS — H353131 Nonexudative age-related macular degeneration, bilateral, early dry stage: Secondary | ICD-10-CM | POA: Diagnosis not present

## 2021-02-12 DIAGNOSIS — Z961 Presence of intraocular lens: Secondary | ICD-10-CM | POA: Diagnosis not present

## 2021-02-12 DIAGNOSIS — H35372 Puckering of macula, left eye: Secondary | ICD-10-CM | POA: Diagnosis not present

## 2021-02-28 DIAGNOSIS — U071 COVID-19: Secondary | ICD-10-CM | POA: Diagnosis not present

## 2021-03-08 ENCOUNTER — Other Ambulatory Visit: Payer: Self-pay | Admitting: Internal Medicine

## 2021-03-08 MED ORDER — CLONAZEPAM 0.5 MG PO TABS: ORAL_TABLET | ORAL | 1 refills | Status: DC

## 2021-03-08 MED ORDER — CLONAZEPAM 0.5 MG PO TABS: ORAL_TABLET | ORAL | 1 refills | Status: AC

## 2021-03-08 NOTE — Telephone Encounter (Signed)
#  [id number] option #2 Ref# [id number]

## 2021-03-08 NOTE — Telephone Encounter (Signed)
Call returned to pharmacy, confirmed patient information, requesting refill of Clonazepam.  Last Filled:07/06/20, Quantity 270. 1 to 3 tabs tab po 30 minutes prior to QHS.   Last OV: 07/06/20  CY please advise, medication has been pended.

## 2021-03-08 NOTE — Telephone Encounter (Signed)
Clonazepam refill sent to mail order

## 2021-03-13 ENCOUNTER — Telehealth: Payer: Medicare Other

## 2021-03-13 ENCOUNTER — Telehealth: Payer: Self-pay | Admitting: Pharmacist

## 2021-03-13 NOTE — Telephone Encounter (Signed)
  Chronic Care Management   Outreach Note  03/13/2021 Name: Lisa Hamilton MRN: [id number] DOB: 12-07-1945  Referred by: Hoyt Koch, MD  Patient had a phone appointment scheduled with clinical pharmacist today.  An unsuccessful telephone outreach was attempted today. The patient was referred to the pharmacist for assistance with medications, care management and care coordination.   Patient will NOT be penalized in any way for missing a CCM appointment. The no-show fee does not apply.  If possible, a message was left to return call to: [phone] or to [facility]: [place], PharmD, Para March, CPP Clinical Pharmacist [facility] [phone]

## 2021-03-13 NOTE — Progress Notes (Deleted)
Chronic Care Management Pharmacy Note  03/13/2021 Name:  Lisa Hamilton MRN:  [id number] DOB:  [birth year]  Summary: ***  Recommendations/Changes made from today's visit: ***  Plan: ***   Subjective: Lisa Hamilton is an 75 y.o. year old female who is a primary patient of Hoyt Koch, MD.  The CCM team was consulted for assistance with disease management and care coordination needs.    Engaged with patient by telephone for follow up visit in response to provider referral for pharmacy case management and/or care coordination services.   Consent to Services:  The patient was given information about Chronic Care Management services, agreed to services, and gave verbal consent prior to initiation of services.  Please see initial visit note for detailed documentation.   Patient Care Team: Hoyt Koch, MD as PCP - General (Internal Medicine) Irine Seal, MD (Urology) Rolm Bookbinder, MD (Dermatology) Rutherford Guys, MD (Ophthalmology) [place], Romilda Garret, [place] (Podiatry) Deneise Lever, MD (Pulmonary Disease) Milus Banister, MD (Gastroenterology) Jerelyn Charles, MD as Consulting Physician (Obstetrics) Edrick Kins, DPM as Consulting Physician (Podiatry) Jerelyn Charles, MD as Consulting Physician (Obstetrics) Suella Broad, MD as Consulting Physician (Physical Medicine and Rehabilitation) Charlton Haws, [facility] as Pharmacist (Pharmacist) Jalene Mullet, MD as Consulting Physician (Ophthalmology)   Patient lives at home with her husband.  12/11/20: she complains of strong-smelling urine. She reports a history of chronic/frequent UTIs. She reports her only symptom currently is the smell, denies frequency and dysuria. She has a message in to her urologist and is waiting to hear back.  Recent office visits: 11/06/20 pt message - started Alendronate 70 mg weekly.  07/22/20 Dr Sharlet Salina OV: chronic f/u. Added donepezil 5 mg for mild cognitive impairment.  Recent consult  visits: 01/31/21 Dr Nelva Bush (pain mgmt): f/u lumbar spondylosis  01/04/21 Dr Annamaria Boots (pulmonary): f/u asthma. Set O2 to 3.5L for sleep. Printed Rx for [employer]. CXR stable  11/27/20 [facility] Audiology: hearing aid candidate  Hospital visits: None in previous 6 months  Objective:  Lab Results  Component Value Date   CREATININE 0.43 07/24/2020   BUN 21 07/24/2020   GFR 96.72 07/24/2020   GFRNONAA >60 12/11/2017   GFRAA >60 12/11/2017   NA 143 07/24/2020   K 3.6 07/24/2020   CALCIUM 8.9 07/24/2020   CO2 34 (H) 07/24/2020    Lab Results  Component Value Date/Time   HGBA1C 5.9 07/24/2020 10:38 AM   HGBA1C 5.9 01/08/2019 09:24 AM   GFR 96.72 07/24/2020 10:38 AM   GFR 130.11 11/16/2019 10:30 AM    Last diabetic Eye exam: No results found for: HMDIABEYEEXA  Last diabetic Foot exam: No results found for: HMDIABFOOTEX   Lab Results  Component Value Date   CHOL 192 07/24/2020   HDL 36.10 (L) 07/24/2020   LDLCALC 119 (H) 07/24/2020   TRIG 185.0 (H) 07/24/2020   CHOLHDL 5 07/24/2020    Hepatic Function Latest Ref Rng & Units 07/24/2020 11/16/2019 01/08/2019  Total Protein 6.0 - 8.3 g/dL 6.6 6.7 6.7  Albumin 3.5 - 5.2 g/dL 3.8 4.0 4.0  AST 0 - 37 U/L _0 ALT 0 - 35 U/L _1 Alk Phosphatase 39 - 117 U/L 83 91 88  Total Bilirubin 0.2 - 1.2 mg/dL 0.5 0.5 0.5  Bilirubin, Direct 0.01 - 0.4 mg/dL - - -    Lab Results  Component Value Date/Time   TSH 1.18 07/24/2020 10:38 AM   TSH 3.26 08/30/2015 08:53 AM  FREET4 1.06 08/30/2015 08:53 AM    CBC Latest Ref Rng & Units 07/24/2020 11/16/2019 01/08/2019  WBC 4.0 - 10.5 K/uL 5.8 5.3 5.6  Hemoglobin 12.0 - 15.0 g/dL 13.4 14.5 14.7  Hematocrit 36.0 - 46.0 % 41.0 44.5 43.9  Platelets 150.0 - 400.0 K/uL 216.0 232.0 222.0    Lab Results  Component Value Date/Time   VD25OH 27.55 (L) 07/24/2020 10:38 AM    Clinical ASCVD: No  The 10-year ASCVD risk score Mikey Bussing DC Jr., et al., 2013) is: 15.1%   Values used to calculate the score:      Age: 29 years     Sex: Female     Is Non-Hispanic African American: No     Diabetic: No     Tobacco smoker: No     Systolic Blood Pressure: 824 mmHg     Is BP treated: Yes     HDL Cholesterol: 36.1 mg/dL     Total Cholesterol: 192 mg/dL    Depression screen [facility] 2/9 07/24/2020 07/23/2019 01/08/2019  Decreased Interest 0 0 0  Down, Depressed, Hopeless 0 1 1  PHQ - 2 Score 0 1 1  Altered sleeping - 2 -  Tired, decreased energy - 1 -  Change in appetite - 0 -  Feeling bad or failure about yourself  - 0 -  Trouble concentrating - 0 -  Moving slowly or fidgety/restless - 0 -  Suicidal thoughts - 0 -  PHQ-9 Score - 4 -  Difficult doing work/chores - Not difficult at all -    Social History   Tobacco Use  Smoking Status Never  Smokeless Tobacco Never   BP Readings from Last 3 Encounters:  01/04/21 118/74  07/24/20 120/70  07/24/20 120/78   Pulse Readings from Last 3 Encounters:  01/04/21 63  07/24/20 68  07/24/20 68   Wt Readings from Last 3 Encounters:  01/04/21 217 lb 9.6 oz (98.7 kg)  07/24/20 226 lb (102.5 kg)  07/24/20 226 lb (102.5 kg)    Assessment/Interventions: Review of patient past medical history, allergies, medications, health status, including review of consultants reports, laboratory and other test data, was performed as part of comprehensive evaluation and provision of chronic care management services.   SDOH:  (Social Determinants of Health) assessments and interventions performed: Yes   CCM Care Plan  Allergies  Allergen Reactions   Hydromorphone Hcl Other (See Comments)    Disorientation and delirium.    Penicillins Rash and Other (See Comments)    PATIENT HAS HAD A PCN REACTION WITH IMMEDIATE RASH, FACIAL/TONGUE/THROAT SWELLING, SOB, OR LIGHTHEADEDNESS WITH HYPOTENSION:  #  #  YES  #  #  Has patient had a PCN reaction causing severe rash involving mucus membranes or skin necrosis: No Has patient had a PCN reaction that required hospitalization:  No Has patient had a PCN reaction occurring within the last 10 years: No If all of the above answers are "NO", then may proceed with Cephalosporin use.    Adhesive [Tape] Rash and Other (See Comments)    Blisters The clear tape    Amoxicillin-Pot Clavulanate Rash   Codeine Other (See Comments)    nightmares    Medications Reviewed Today     Reviewed by Deneise Lever, MD (Physician) on 01/04/21 at [phone]  Med List Status: <None>   Medication Order Taking? Sig Documenting Provider Last Dose Status Informant  acetaminophen (TYLENOL) 500 MG tablet [id number] Yes Take 1,000 mg by mouth 3 (three) times daily as  needed for moderate pain or headache. [provider] Taking Active Self           Med Note Nat Christen   Tue Sep 01, 2018  1:09 PM)    albuterol (PROVENTIL) (2.5 MG/3ML) 0.083% nebulizer solution [id number] Yes USE 1 VIAL IN NEBULIZER 4 TIMES DAILY. Generic: Caryl Never, MD Taking Active   albuterol (VENTOLIN HFA) 108 (90 Base) MCG/ACT inhaler [id number] Yes USE 1 TO 2 INHALATIONS BY  MOUTH EVERY 6 HOURS AS  NEEDED FOR WHEEZING OR  SHORTNESS OF BREATH Young, Kasandra Knudsen, MD Taking Active   alendronate (FOSAMAX) 70 MG tablet [id number] Yes Take 1 tablet (70 mg total) by mouth every 7 (seven) days. Take with a full glass of water on an empty stomach. Hoyt Koch, MD Taking Active   aspirin EC 81 MG tablet [id number] Yes Take 162 mg by mouth at bedtime. [provider] Taking Active Self  Calcium Carb-Cholecalciferol 600-800 MG-UNIT TABS [id number] Yes Take 1 tablet by mouth 2 (two) times daily. [provider] Taking Active Self  Cholecalciferol (VITAMIN D) 2000 units tablet [id number] Yes Take 2,000 Units by mouth daily. [provider] Taking Active Self  clonazePAM (KLONOPIN) 0.5 MG tablet [id number] Yes TAKE 1 TO 3 TABLETS BY  MOUTH 30 MINUTES BEFORE  BEDTIME Baird Lyons D, MD Taking Active   Co-Enzyme Q-10 100 MG CAPS  [id number] Yes Take 100 mg by mouth daily. [provider] Taking Active Self  Cranberry 180 MG CAPS [id number] Yes Take 1 capsule by mouth daily. [provider] Taking Active   diclofenac Sodium (VOLTAREN) 1 % GEL [id number] Yes  [provider] Taking Active   donepezil (ARICEPT) 10 MG tablet [id number] Yes Take 1 tablet (10 mg total) by mouth at bedtime. Hoyt Koch, MD Taking Active   fluticasone [facility]) 50 MCG/ACT nasal spray [id number] Yes Place 2 sprays into both nostrils daily. Hoyt Koch, MD Taking Active   fluticasone furoate-vilanterol (BREO ELLIPTA) 100-25 MCG/INH AEPB [id number] Yes Inhale 1 puff then rinse mouth,once daily Baird Lyons D, MD Taking Active   furosemide (LASIX) 20 MG tablet [id number] Yes Take 1 tablet (20 mg total) by mouth daily.  Patient taking differently: Take 20 mg by mouth daily as needed.   Deneise Lever, MD Taking Active   gabapentin (NEURONTIN) 300 MG capsule [id number] Yes Take 1 capsule (300 mg total) by mouth 3 (three) times daily. Deneise Lever, MD Taking Active   guaiFENesin (MUCINEX) 600 MG 12 hr tablet [id number] Yes Take 600 mg by mouth 2 (two) times daily as needed (congestion).  [provider] Taking Active Self  ibuprofen (ADVIL,MOTRIN) 200 MG tablet [id number] Yes Take 400 mg by mouth 3 (three) times daily as needed for headache or moderate pain. [provider] Taking Active Self           Med Note Nat Christen   Tue Sep 01, 2018  1:09 PM)    losartan (COZAAR) 50 MG tablet [id number] Yes Take 1 tablet (50 mg total) by mouth daily. Hoyt Koch, MD Taking Active   Magnesium 400 MG CAPS [id number] Yes Take 400 mg by mouth 2 (two) times daily.  [provider] Taking Active Self  montelukast (SINGULAIR) 10 MG tablet [id number] Yes Take 1 tablet (10 mg total) by mouth every evening. Deneise Lever, MD Taking Active   Multiple Vitamins-Minerals (PRESERVISION AREDS 2)  CAPS [id number] Yes Take 1 capsule by mouth  2 (two) times daily. [provider] Taking Active Self  Omega-3 Fatty Acids (FISH OIL) 1200 MG CAPS [id number] Yes Take 1,200 mg by mouth daily. [provider] Taking Active Self  OXYGEN [id number] Yes Inhale 2 L into the lungs at bedtime.  [provider] Taking Active Self  polyethylene glycol (MIRALAX / GLYCOLAX) packet [id number] Yes Take 17 g by mouth 3 (three) times daily as needed for moderate constipation. [provider] Taking Active Self  pravastatin (PRAVACHOL) 40 MG tablet [id number] Yes Take 1 tablet (40 mg total) by mouth daily. Hoyt Koch, MD Taking Active   Propylene Glycol [facility] BALANCE OP) [id number] Yes Place 1 drop into both eyes daily as needed (dry eyes). [provider] Taking Active Self  rOPINIRole (REQUIP) 0.5 MG tablet [id number] Yes Take up to 6 tablets by mouth daily as needed Baird Lyons D, MD Taking Active   traMADol (ULTRAM) 50 MG tablet [id number] Yes Take 1 tablet (50 mg total) by mouth daily as needed. Hoyt Koch, MD Taking Active Self  Turmeric 500 MG CAPS [id number] Yes Take 1 capsule by mouth daily. [provider] Taking Active   vitamin B-12 (CYANOCOBALAMIN) 1000 MCG tablet [id number] Yes Take 1,000 mcg by mouth daily. [provider] Taking Active             Patient Active Problem List   Diagnosis Date Noted   Mild cognitive disorder 07/24/2020   Peripheral edema 02/12/2020   History of colonic polyps    Chronic respiratory failure with hypoxia ([place]) 03/04/2018   Nephrolithiasis 10/15/2017   Anemia 12/22/2015   Hyperglycemia 01/04/2014   NONTOXIC MULTINODULAR GOITER 08/06/2008   HYPERCHOLESTEROLEMIA 08/06/2008   RESTLESS LEG SYNDROME 08/06/2008   Essential hypertension 08/06/2008   Diverticulosis of colon without hemorrhage 08/06/2008   Liver cyst s/p partial hepatectomy 12/04/2017 08/06/2008   MEDULLARY SPONGE KIDNEY  08/06/2008   VITAMIN D DEFICIENCY 10/21/2007   Morbid obesity ([place]) 10/21/2007   GLAUCOMA 10/21/2007   Asthma in adult, mild persistent, uncomplicated 95/04/3266   Osteoarthritis 10/21/2007   Osteoporosis 10/21/2007    Immunization History  Administered Date(s) Administered   H1N1 07/27/2008   Influenza Split 05/16/2011   Influenza, High Dose Seasonal PF 06/13/2015, 05/11/2016, 05/06/2018, 05/11/2019   Influenza,inj,Quad PF,6+ Mos 06/02/2013, 06/07/2014   Influenza-Unspecified 06/13/2015, 05/05/2017, 05/06/2018, 05/18/2020   Moderna Sars-Covid-2 Vaccination 08/30/2019, 09/27/2019, 05/27/2020   Pneumococcal Conjugate-13 01/04/2014   Pneumococcal Polysaccharide-23 05/15/2012, 07/01/2017   Td 09/26/2009   Tdap 06/07/2014   Zoster Recombinat (Shingrix) 09/03/2017, 01/09/2018   Zoster, Live 01/09/2013    Conditions to be addressed/monitored:  Hypertension, Hyperlipidemia, Asthma and Cognitive impairment  There are no care plans that you recently modified to display for this patient.  Medication Assistance: None required.  Patient affirms current coverage meets needs.  Compliance/Adherence/Medication fill history: Care Gaps: Mammogram (due 07/30/20) Covid booster (due 09/27/20)  Star-Rating Drugs: Losartan - LF 02/20/21 x 90 ds Pravastatin - LF 02/20/21 x 90 ds  Patient's preferred pharmacy is:  [facility], [place] to [address] [place] [address] Phone: [phone] Fax: [place], [place] [address] Melina Modena [address] Fredderick [place] [address] Phone: [phone] Fax: [phone]  Uses pill box? Yes Pt endorses 100% compliance  We discussed: Current pharmacy is preferred with insurance plan and patient is satisfied with pharmacy services -  pt has just recently switched insurance plans so she has switched mail order  pharmacies Patient decided to: Continue current medication management strategy  Care Plan and Follow Up Patient Decision:  Patient agrees to Care Plan and Follow-up.  Plan: Telephone follow up appointment with care management team member scheduled for:  3 months  Charlene Brooke, PharmD, BCACP Clinical Pharmacist [facility] [phone]    Current Barriers:  Unable to independently monitor therapeutic efficacy Unable to maintain control of strong-smelling urine  Pharmacist Clinical Goal(s):  Patient will achieve adherence to monitoring guidelines and medication adherence to achieve therapeutic efficacy adhere to plan to optimize therapeutic regimen for cognitive impairment as evidenced by report of adherence to recommended medication management changes through collaboration with PharmD and provider.   Interventions: 1:1 collaboration with Hoyt Koch, MD regarding development and update of comprehensive plan of care as evidenced by provider attestation and co-signature Inter-disciplinary care team collaboration (see longitudinal plan of care) Comprehensive medication review performed; medication list updated in electronic medical record  Hypertension (BP goal < 130/80) Controlled Current regimen:  Losartan 50 mg daily Furosemide 20 mg daily Interventions: Discussed BP goals and benefits of medications for prevention of heart attack / stroke Recommend to continue current medication   Hyperlipidemia (LDL goal < 100) Controlled  Current regimen:  Pravastatin 40 mg daily Coenzyme Q10 100 mg daily Aspirin 81 mg daily Omega-3 FA Fish Oil 1000 mg BID Interventions: Discussed cholesterol goals and benefits of medications for prevention of heart attack / stroke. May consider switch to rosuvastatin if LDL above goal at next check Advised to reduce Fish Oil to once daily to see if it helps with strong-smelling urine    Asthma Controlled Current regimen:  Albuterol 0.083% nebulizer soln twice daily as needed Albuterol HFA as needed Breo Ellipta 100-25 mcg/inh 1 puff daily Montelukast 10 mg daily at bedtime Interventions: Discussed benefits of inhalers and proper inhaler technique Patient's income is too high for patient assistance Patient self care activities  Continue current medications   Dementia/cognitive impairment Controlled  - pt is adherent and denies side effects Current regimen:  Donepezil 10 mg daily Interventions: Discussed benefits of donepezil - delayed progression of symptoms Recommend to continue current medication  Osteoporosis (Goal prevent fractures) -Controlled -Last DEXA Scan: 10/2019   T-Score femoral neck: -1.7  T-Score total hip: -1.6  T-Score forearm radius: -3.2 -Patient is a candidate for pharmacologic treatment due to T-Score < -2.5 -Current treatment  Alendronate 70 mg weekly - Tuesdays Calcium 600 mg BID Vitamin D 2000 IU daily -Recommend [phone] units of vitamin D daily. Recommend 1200 mg of calcium daily from dietary and supplemental sources. Counseled on oral bisphosphonate administration: take in the morning, 30 minutes prior to food with 6-8 oz of water. Do not lie down for at least 30 minutes after taking.  Health maintenance Current regimen:  Miralax PRN Magnesium 400 mg BID Multivitamin Preservision AREDS 2 Systane eye drops Mucinex 600 mg PRN Turmeric 500 mg BID Prevagen Interventions: Discussed differences between AREDS and AREDS2 - AREDS 2 added 2 supplements (lutein and zeaxanthine) and removed beta-carotene (Vitamin A). Beta-carotene supplemenation has been shown to increase the risk of lung cancer in former smokers. Advised to continue Preservision Areds 2 Counseled on possibility of turmeric leading to strong-smelling urine. Advised to reduce turmeric to once daily dosing Counseled on questionable benefits of prevagen; pt has also  discussed with neurologist and concluded there is little research to support its  use and it is very costly; patient plans to stop when she finishes her current supply  Patient Goals/Self-Care Activities Patient will:  - take medications as prescribed focus on medication adherence by pill box -Reduce Fish Oil and Turmeric to once daily and monitor urine

## 2021-03-13 NOTE — Telephone Encounter (Signed)
Called and spoke to pt. Informed her of the refill per CY. Pt verbalized understanding and denied any further questions or concerns at this time.

## 2021-03-15 DIAGNOSIS — H353211 Exudative age-related macular degeneration, right eye, with active choroidal neovascularization: Secondary | ICD-10-CM | POA: Diagnosis not present

## 2021-03-30 DIAGNOSIS — M47896 Other spondylosis, lumbar region: Secondary | ICD-10-CM | POA: Diagnosis not present

## 2021-03-30 DIAGNOSIS — M47816 Spondylosis without myelopathy or radiculopathy, lumbar region: Secondary | ICD-10-CM | POA: Diagnosis not present

## 2021-04-18 DIAGNOSIS — H353211 Exudative age-related macular degeneration, right eye, with active choroidal neovascularization: Secondary | ICD-10-CM | POA: Diagnosis not present

## 2021-04-23 DIAGNOSIS — N200 Calculus of kidney: Secondary | ICD-10-CM | POA: Diagnosis not present

## 2021-04-23 DIAGNOSIS — N3281 Overactive bladder: Secondary | ICD-10-CM | POA: Diagnosis not present

## 2021-04-23 DIAGNOSIS — N3020 Other chronic cystitis without hematuria: Secondary | ICD-10-CM | POA: Diagnosis not present

## 2021-04-23 DIAGNOSIS — N3941 Urge incontinence: Secondary | ICD-10-CM | POA: Diagnosis not present

## 2021-04-24 ENCOUNTER — Telehealth: Payer: Self-pay | Admitting: Pharmacist

## 2021-04-24 NOTE — Progress Notes (Signed)
Chronic Care Management Pharmacy Assistant   Name: Lisa Hamilton  MRN: [id number] DOB: [birth year]   Reason for Encounter: Disease State   Conditions to be addressed/monitored: DMII   Recent office visits:  None ID  Recent consult visits:  02/12/21 Hyman Hopes, MD  (Macular Degeneration)  Hospital visits:  None in previous 6 months  Medications: Outpatient Encounter Medications as of 04/24/2021  Medication Sig   acetaminophen (TYLENOL) 500 MG tablet Take 1,000 mg by mouth 3 (three) times daily as needed for moderate pain or headache.   albuterol (PROVENTIL) (2.5 MG/3ML) 0.083% nebulizer solution USE 1 VIAL IN NEBULIZER 4 TIMES DAILY. Generic: VENTOLIN   albuterol (VENTOLIN HFA) 108 (90 Base) MCG/ACT inhaler USE 1 TO 2 INHALATIONS BY  MOUTH EVERY 6 HOURS AS  NEEDED FOR WHEEZING OR  SHORTNESS OF BREATH   alendronate (FOSAMAX) 70 MG tablet Take 1 tablet (70 mg total) by mouth every 7 (seven) days. Take with a full glass of water on an empty stomach.   aspirin EC 81 MG tablet Take 162 mg by mouth at bedtime.   Calcium Carb-Cholecalciferol 600-800 MG-UNIT TABS Take 1 tablet by mouth 2 (two) times daily.   Cholecalciferol (VITAMIN D) 2000 units tablet Take 2,000 Units by mouth daily.   clonazePAM (KLONOPIN) 0.5 MG tablet TAKE 1 TO 3 TABLETS BY  MOUTH 30 MINUTES BEFORE  BEDTIME   Co-Enzyme Q-10 100 MG CAPS Take 100 mg by mouth daily.   Cranberry 180 MG CAPS Take 1 capsule by mouth daily.   diclofenac Sodium (VOLTAREN) 1 % GEL    donepezil (ARICEPT) 10 MG tablet Take 1 tablet (10 mg total) by mouth at bedtime.   fluticasone (FLONASE) 50 MCG/ACT nasal spray Place 2 sprays into both nostrils daily.   fluticasone furoate-vilanterol (BREO ELLIPTA) 100-25 MCG/INH AEPB Inhale 1 puff then rinse mouth,once daily   furosemide (LASIX) 20 MG tablet Take 1 tablet (20 mg total) by mouth daily. (Patient taking differently: Take 20 mg by mouth daily as needed.)   gabapentin (NEURONTIN) 300  MG capsule Take 1 capsule (300 mg total) by mouth 3 (three) times daily.   guaiFENesin (MUCINEX) 600 MG 12 hr tablet Take 600 mg by mouth 2 (two) times daily as needed (congestion).    ibuprofen (ADVIL,MOTRIN) 200 MG tablet Take 400 mg by mouth 3 (three) times daily as needed for headache or moderate pain.   losartan (COZAAR) 50 MG tablet Take 1 tablet (50 mg total) by mouth daily.   Magnesium 400 MG CAPS Take 400 mg by mouth 2 (two) times daily.    montelukast (SINGULAIR) 10 MG tablet Take 1 tablet (10 mg total) by mouth every evening.   Multiple Vitamins-Minerals (PRESERVISION AREDS 2) CAPS Take 1 capsule by mouth 2 (two) times daily.   Omega-3 Fatty Acids (FISH OIL) 1200 MG CAPS Take 1,200 mg by mouth daily.   OXYGEN Inhale 2 L into the lungs at bedtime.    polyethylene glycol (MIRALAX / GLYCOLAX) packet Take 17 g by mouth 3 (three) times daily as needed for moderate constipation.   pravastatin (PRAVACHOL) 40 MG tablet Take 1 tablet (40 mg total) by mouth daily.   Propylene Glycol (SYSTANE BALANCE OP) Place 1 drop into both eyes daily as needed (dry eyes).   rOPINIRole (REQUIP) 0.5 MG tablet Take up to 6 tablets by mouth daily as needed   traMADol (ULTRAM) 50 MG tablet Take 1 tablet (50 mg total) by mouth daily as needed.  Turmeric 500 MG CAPS Take 1 capsule by mouth daily.   vitamin B-12 (CYANOCOBALAMIN) 1000 MCG tablet Take 1,000 mcg by mouth daily.   No facility-administered encounter medications on file as of 04/24/2021.     Recent Relevant Labs: Lab Results  Component Value Date/Time   HGBA1C 5.9 07/24/2020 10:38 AM   HGBA1C 5.9 01/08/2019 09:24 AM    Kidney Function Lab Results  Component Value Date/Time   CREATININE 0.43 07/24/2020 10:38 AM   CREATININE 0.47 11/16/2019 10:30 AM   GFR 96.72 07/24/2020 10:38 AM   GFRNONAA >60 12/11/2017 07:36 AM   GFRAA >60 12/11/2017 07:36 AM    Recent Office Vitals: BP Readings from Last 3 Encounters:  01/04/21 118/74  07/24/20  120/70  07/24/20 120/78   Pulse Readings from Last 3 Encounters:  01/04/21 63  07/24/20 68  07/24/20 68    Wt Readings from Last 3 Encounters:  01/04/21 217 lb 9.6 oz (98.7 kg)  07/24/20 226 lb (102.5 kg)  07/24/20 226 lb (102.5 kg)     Kidney Function Lab Results  Component Value Date/Time   CREATININE 0.43 07/24/2020 10:38 AM   CREATININE 0.47 11/16/2019 10:30 AM   GFR 96.72 07/24/2020 10:38 AM   GFRNONAA >60 12/11/2017 07:36 AM   GFRAA >60 12/11/2017 07:36 AM    BMP Latest Ref Rng & Units 07/24/2020 11/16/2019 01/08/2019  Glucose 70 - 99 mg/dL 87 102(H) 91  BUN 6 - 23 mg/dL 21 16 21   Creatinine 0.40 - 1.20 mg/dL 0.43 0.47 0.52  Sodium 135 - 145 mEq/L 143 141 143  Potassium 3.5 - 5.1 mEq/L 3.6 3.9 4.3  Chloride 96 - 112 mEq/L 104 104 105  CO2 19 - 32 mEq/L 34(H) 31 30  Calcium 8.4 - 10.5 mg/dL 8.9 9.1 9.1     Contacted patient on 04/24/21 to discuss hypertension disease state  Current antihypertensive regimen:  Losartan 50 mg daily  Patient verbally confirms she is taking the above medications as directed. Yes  How often are you checking your Blood Pressure? infrequently  she checks her blood pressure in the afternoon after taking her medication.  Current home BP readings: 123/80 at [facility]   Wrist or arm cuff: Arm cuff Caffeine intake: drinks about 1 or 2 sodas Salt intake:she has use salt on some foods OTC medications including pseudoephedrine or NSAIDs? Patient uses inhalers and ibuprofen  Any readings above 180/120? No If yes any symptoms of hypertensive emergency? patient denies any symptoms of high blood pressure   What recent interventions/DTPs have been made by any provider to improve Blood Pressure control since last CPP Visit: none noted  Any recent hospitalizations or ED visits since last visit with CPP? No  What diet changes have been made to improve Blood Pressure Control?  Patient states that she is now on a Keto diet  What  exercise is being done to improve your Blood Pressure Control?  Does not exercise because she is on oxygen 24/7  Patient states that she is wanting to talk with the clinical pharmacist about what is good for short term memory that will work with all of her other medications. She states that she is forgetting to take her medications, phone remote control and when that happens she panics which she thinks elevates her blood pressure.   Adherence Review: Is the patient currently on ACE/ARB medication? Yes Does the patient have >5 day gap between last estimated fill dates? No   Star Rating Drugs:  Medication:  Last Fill: Day Supply Losartan 50 mg 02/20/21 90 Pravastatin 40 mg 02/20/21 90  Care Gaps: Annual wellness visit in last year? Yes Most Recent BP reading:123/80    Pulmonology appointment with on 07/06/21    [place] Pharmacist Assistant [phone]   Time spent:42

## 2021-05-02 ENCOUNTER — Emergency Department (HOSPITAL_COMMUNITY): Payer: Medicare Other

## 2021-05-02 ENCOUNTER — Telehealth: Payer: Self-pay | Admitting: Internal Medicine

## 2021-05-02 ENCOUNTER — Other Ambulatory Visit (INDEPENDENT_AMBULATORY_CARE_PROVIDER_SITE_OTHER): Payer: Medicare Other

## 2021-05-02 ENCOUNTER — Other Ambulatory Visit: Payer: Self-pay

## 2021-05-02 ENCOUNTER — Inpatient Hospital Stay (HOSPITAL_COMMUNITY)
Admission: EM | Admit: 2021-05-02 | Discharge: 2021-06-05 | DRG: 166 | Disposition: E | Payer: Medicare Other | Attending: Pulmonary Disease | Admitting: Pulmonary Disease

## 2021-05-02 ENCOUNTER — Ambulatory Visit (INDEPENDENT_AMBULATORY_CARE_PROVIDER_SITE_OTHER): Payer: Medicare Other

## 2021-05-02 DIAGNOSIS — J9602 Acute respiratory failure with hypercapnia: Secondary | ICD-10-CM | POA: Diagnosis not present

## 2021-05-02 DIAGNOSIS — E162 Hypoglycemia, unspecified: Secondary | ICD-10-CM | POA: Diagnosis not present

## 2021-05-02 DIAGNOSIS — H409 Unspecified glaucoma: Secondary | ICD-10-CM | POA: Diagnosis present

## 2021-05-02 DIAGNOSIS — J9621 Acute and chronic respiratory failure with hypoxia: Secondary | ICD-10-CM | POA: Diagnosis present

## 2021-05-02 DIAGNOSIS — Z883 Allergy status to other anti-infective agents status: Secondary | ICD-10-CM

## 2021-05-02 DIAGNOSIS — Z9689 Presence of other specified functional implants: Secondary | ICD-10-CM

## 2021-05-02 DIAGNOSIS — G2581 Restless legs syndrome: Secondary | ICD-10-CM | POA: Diagnosis present

## 2021-05-02 DIAGNOSIS — R570 Cardiogenic shock: Secondary | ICD-10-CM | POA: Diagnosis not present

## 2021-05-02 DIAGNOSIS — J810 Acute pulmonary edema: Secondary | ICD-10-CM | POA: Diagnosis present

## 2021-05-02 DIAGNOSIS — K5900 Constipation, unspecified: Secondary | ICD-10-CM

## 2021-05-02 DIAGNOSIS — J9611 Chronic respiratory failure with hypoxia: Secondary | ICD-10-CM

## 2021-05-02 DIAGNOSIS — Z7983 Long term (current) use of bisphosphonates: Secondary | ICD-10-CM

## 2021-05-02 DIAGNOSIS — G9341 Metabolic encephalopathy: Secondary | ICD-10-CM | POA: Diagnosis not present

## 2021-05-02 DIAGNOSIS — Z7982 Long term (current) use of aspirin: Secondary | ICD-10-CM

## 2021-05-02 DIAGNOSIS — E876 Hypokalemia: Secondary | ICD-10-CM | POA: Diagnosis not present

## 2021-05-02 DIAGNOSIS — J9622 Acute and chronic respiratory failure with hypercapnia: Secondary | ICD-10-CM

## 2021-05-02 DIAGNOSIS — Z4659 Encounter for fitting and adjustment of other gastrointestinal appliance and device: Secondary | ICD-10-CM

## 2021-05-02 DIAGNOSIS — N179 Acute kidney failure, unspecified: Secondary | ICD-10-CM

## 2021-05-02 DIAGNOSIS — I469 Cardiac arrest, cause unspecified: Secondary | ICD-10-CM | POA: Diagnosis not present

## 2021-05-02 DIAGNOSIS — J690 Pneumonitis due to inhalation of food and vomit: Secondary | ICD-10-CM | POA: Diagnosis not present

## 2021-05-02 DIAGNOSIS — D6489 Other specified anemias: Secondary | ICD-10-CM | POA: Diagnosis not present

## 2021-05-02 DIAGNOSIS — G931 Anoxic brain damage, not elsewhere classified: Secondary | ICD-10-CM | POA: Diagnosis not present

## 2021-05-02 DIAGNOSIS — Z66 Do not resuscitate: Secondary | ICD-10-CM | POA: Diagnosis not present

## 2021-05-02 DIAGNOSIS — M810 Age-related osteoporosis without current pathological fracture: Secondary | ICD-10-CM | POA: Diagnosis present

## 2021-05-02 DIAGNOSIS — I3139 Other pericardial effusion (noninflammatory): Secondary | ICD-10-CM | POA: Diagnosis present

## 2021-05-02 DIAGNOSIS — J80 Acute respiratory distress syndrome: Secondary | ICD-10-CM | POA: Diagnosis not present

## 2021-05-02 DIAGNOSIS — Z880 Allergy status to penicillin: Secondary | ICD-10-CM

## 2021-05-02 DIAGNOSIS — T508X5A Adverse effect of diagnostic agents, initial encounter: Secondary | ICD-10-CM | POA: Diagnosis not present

## 2021-05-02 DIAGNOSIS — J441 Chronic obstructive pulmonary disease with (acute) exacerbation: Secondary | ICD-10-CM | POA: Diagnosis present

## 2021-05-02 DIAGNOSIS — F39 Unspecified mood [affective] disorder: Secondary | ICD-10-CM | POA: Diagnosis present

## 2021-05-02 DIAGNOSIS — R34 Anuria and oliguria: Secondary | ICD-10-CM | POA: Diagnosis not present

## 2021-05-02 DIAGNOSIS — I129 Hypertensive chronic kidney disease with stage 1 through stage 4 chronic kidney disease, or unspecified chronic kidney disease: Secondary | ICD-10-CM | POA: Diagnosis present

## 2021-05-02 DIAGNOSIS — Z885 Allergy status to narcotic agent status: Secondary | ICD-10-CM

## 2021-05-02 DIAGNOSIS — R569 Unspecified convulsions: Secondary | ICD-10-CM | POA: Diagnosis not present

## 2021-05-02 DIAGNOSIS — Z79899 Other long term (current) drug therapy: Secondary | ICD-10-CM

## 2021-05-02 DIAGNOSIS — F09 Unspecified mental disorder due to known physiological condition: Secondary | ICD-10-CM

## 2021-05-02 DIAGNOSIS — J90 Pleural effusion, not elsewhere classified: Secondary | ICD-10-CM | POA: Diagnosis not present

## 2021-05-02 DIAGNOSIS — I480 Paroxysmal atrial fibrillation: Secondary | ICD-10-CM | POA: Diagnosis not present

## 2021-05-02 DIAGNOSIS — R059 Cough, unspecified: Secondary | ICD-10-CM | POA: Diagnosis not present

## 2021-05-02 DIAGNOSIS — Z20822 Contact with and (suspected) exposure to covid-19: Secondary | ICD-10-CM | POA: Diagnosis present

## 2021-05-02 DIAGNOSIS — N170 Acute kidney failure with tubular necrosis: Secondary | ICD-10-CM | POA: Diagnosis not present

## 2021-05-02 DIAGNOSIS — J939 Pneumothorax, unspecified: Secondary | ICD-10-CM

## 2021-05-02 DIAGNOSIS — Z801 Family history of malignant neoplasm of trachea, bronchus and lung: Secondary | ICD-10-CM

## 2021-05-02 DIAGNOSIS — Z01818 Encounter for other preprocedural examination: Secondary | ICD-10-CM

## 2021-05-02 DIAGNOSIS — E662 Morbid (severe) obesity with alveolar hypoventilation: Secondary | ICD-10-CM | POA: Diagnosis present

## 2021-05-02 DIAGNOSIS — R739 Hyperglycemia, unspecified: Secondary | ICD-10-CM | POA: Diagnosis not present

## 2021-05-02 DIAGNOSIS — I10 Essential (primary) hypertension: Secondary | ICD-10-CM | POA: Diagnosis present

## 2021-05-02 DIAGNOSIS — Z515 Encounter for palliative care: Secondary | ICD-10-CM

## 2021-05-02 DIAGNOSIS — E7800 Pure hypercholesterolemia, unspecified: Secondary | ICD-10-CM | POA: Diagnosis present

## 2021-05-02 DIAGNOSIS — I959 Hypotension, unspecified: Secondary | ICD-10-CM | POA: Diagnosis not present

## 2021-05-02 DIAGNOSIS — T50Z95A Adverse effect of other vaccines and biological substances, initial encounter: Secondary | ICD-10-CM | POA: Diagnosis present

## 2021-05-02 DIAGNOSIS — E8729 Other acidosis: Secondary | ICD-10-CM | POA: Diagnosis present

## 2021-05-02 DIAGNOSIS — M1990 Unspecified osteoarthritis, unspecified site: Secondary | ICD-10-CM | POA: Diagnosis present

## 2021-05-02 DIAGNOSIS — J4531 Mild persistent asthma with (acute) exacerbation: Secondary | ICD-10-CM | POA: Diagnosis present

## 2021-05-02 DIAGNOSIS — Z9289 Personal history of other medical treatment: Secondary | ICD-10-CM

## 2021-05-02 DIAGNOSIS — T884XXA Failed or difficult intubation, initial encounter: Secondary | ICD-10-CM | POA: Diagnosis not present

## 2021-05-02 DIAGNOSIS — Z452 Encounter for adjustment and management of vascular access device: Secondary | ICD-10-CM

## 2021-05-02 DIAGNOSIS — Z6841 Body Mass Index (BMI) 40.0 and over, adult: Secondary | ICD-10-CM

## 2021-05-02 DIAGNOSIS — R0789 Other chest pain: Secondary | ICD-10-CM | POA: Diagnosis not present

## 2021-05-02 DIAGNOSIS — T17890A Other foreign object in other parts of respiratory tract causing asphyxiation, initial encounter: Secondary | ICD-10-CM | POA: Diagnosis not present

## 2021-05-02 DIAGNOSIS — R0602 Shortness of breath: Secondary | ICD-10-CM | POA: Diagnosis not present

## 2021-05-02 DIAGNOSIS — Z8719 Personal history of other diseases of the digestive system: Secondary | ICD-10-CM

## 2021-05-02 DIAGNOSIS — J9690 Respiratory failure, unspecified, unspecified whether with hypoxia or hypercapnia: Secondary | ICD-10-CM

## 2021-05-02 DIAGNOSIS — Z9981 Dependence on supplemental oxygen: Secondary | ICD-10-CM

## 2021-05-02 DIAGNOSIS — Z800 Family history of malignant neoplasm of digestive organs: Secondary | ICD-10-CM

## 2021-05-02 DIAGNOSIS — I517 Cardiomegaly: Secondary | ICD-10-CM | POA: Diagnosis not present

## 2021-05-02 DIAGNOSIS — Z7951 Long term (current) use of inhaled steroids: Secondary | ICD-10-CM

## 2021-05-02 DIAGNOSIS — E875 Hyperkalemia: Secondary | ICD-10-CM | POA: Diagnosis not present

## 2021-05-02 DIAGNOSIS — Z9911 Dependence on respirator [ventilator] status: Secondary | ICD-10-CM

## 2021-05-02 DIAGNOSIS — K567 Ileus, unspecified: Secondary | ICD-10-CM | POA: Diagnosis not present

## 2021-05-02 DIAGNOSIS — T380X5A Adverse effect of glucocorticoids and synthetic analogues, initial encounter: Secondary | ICD-10-CM | POA: Diagnosis not present

## 2021-05-02 DIAGNOSIS — J9601 Acute respiratory failure with hypoxia: Secondary | ICD-10-CM

## 2021-05-02 DIAGNOSIS — D72829 Elevated white blood cell count, unspecified: Secondary | ICD-10-CM | POA: Diagnosis not present

## 2021-05-02 DIAGNOSIS — J4530 Mild persistent asthma, uncomplicated: Secondary | ICD-10-CM | POA: Diagnosis present

## 2021-05-02 DIAGNOSIS — Z978 Presence of other specified devices: Secondary | ICD-10-CM

## 2021-05-02 DIAGNOSIS — N189 Chronic kidney disease, unspecified: Secondary | ICD-10-CM | POA: Diagnosis present

## 2021-05-02 DIAGNOSIS — G3184 Mild cognitive impairment, so stated: Secondary | ICD-10-CM | POA: Diagnosis present

## 2021-05-02 LAB — CBC WITH DIFFERENTIAL/PLATELET
Abs Immature Granulocytes: 0.06 10*3/uL (ref 0.00–0.07)
Basophils Absolute: 0.1 10*3/uL (ref 0.0–0.1)
Basophils Absolute: 0.1 10*3/uL (ref 0.0–0.1)
Basophils Relative: 0.6 % (ref 0.0–3.0)
Basophils Relative: 1 %
Eosinophils Absolute: 0 10*3/uL (ref 0.0–0.7)
Eosinophils Absolute: 0 10*3/uL (ref 0.0–0.5)
Eosinophils Relative: 0.5 % (ref 0.0–5.0)
Eosinophils Relative: 0 %
HCT: 38.9 % (ref 36.0–46.0)
HCT: 42.7 % (ref 36.0–46.0)
Hemoglobin: 11.9 g/dL — ABNORMAL LOW (ref 12.0–15.0)
Hemoglobin: 12 g/dL (ref 12.0–15.0)
Immature Granulocytes: 1 %
Lymphocytes Relative: 9.3 % — ABNORMAL LOW (ref 12.0–46.0)
Lymphocytes Relative: 4 %
Lymphs Abs: 0.9 10*3/uL (ref 0.7–4.0)
Lymphs Abs: 0.4 10*3/uL — ABNORMAL LOW (ref 0.7–4.0)
MCH: 29.7 pg (ref 26.0–34.0)
MCHC: 30.5 g/dL (ref 30.0–36.0)
MCHC: 28.1 g/dL — ABNORMAL LOW (ref 30.0–36.0)
MCV: 96.1 fl (ref 78.0–100.0)
MCV: 105.7 fL — ABNORMAL HIGH (ref 80.0–100.0)
Monocytes Absolute: 0.9 10*3/uL (ref 0.1–1.0)
Monocytes Absolute: 0.6 10*3/uL (ref 0.1–1.0)
Monocytes Relative: 9.9 % (ref 3.0–12.0)
Monocytes Relative: 6 %
Neutro Abs: 7.5 10*3/uL (ref 1.4–7.7)
Neutro Abs: 8.3 10*3/uL — ABNORMAL HIGH (ref 1.7–7.7)
Neutrophils Relative %: 79.7 % — ABNORMAL HIGH (ref 43.0–77.0)
Neutrophils Relative %: 88 %
Platelets: 392 10*3/uL (ref 150.0–400.0)
Platelets: 365 10*3/uL (ref 150–400)
RBC: 4.05 Mil/uL (ref 3.87–5.11)
RBC: 4.04 MIL/uL (ref 3.87–5.11)
RDW: 14.6 % (ref 11.5–15.5)
RDW: 13.3 % (ref 11.5–15.5)
WBC: 9.4 10*3/uL (ref 4.0–10.5)
WBC: 9.4 10*3/uL (ref 4.0–10.5)
nRBC: 0 % (ref 0.0–0.2)

## 2021-05-02 LAB — COMPREHENSIVE METABOLIC PANEL
ALT: 19 U/L (ref 0–44)
AST: 20 U/L (ref 15–41)
Albumin: 3.3 g/dL — ABNORMAL LOW (ref 3.5–5.0)
Alkaline Phosphatase: 105 U/L (ref 38–126)
Anion gap: 7 (ref 5–15)
BUN: 24 mg/dL — ABNORMAL HIGH (ref 8–23)
CO2: 35 mmol/L — ABNORMAL HIGH (ref 22–32)
Calcium: 9.4 mg/dL (ref 8.9–10.3)
Chloride: 99 mmol/L (ref 98–111)
Creatinine, Ser: 0.56 mg/dL (ref 0.44–1.00)
GFR, Estimated: >60 mL/min (ref >60)
Glucose, Bld: 145 mg/dL — ABNORMAL HIGH (ref 70–99)
Potassium: 4 mmol/L (ref 3.5–5.1)
Sodium: 141 mmol/L (ref 135–145)
Total Bilirubin: 1.3 mg/dL — ABNORMAL HIGH (ref 0.3–1.2)
Total Protein: 6.8 g/dL (ref 6.5–8.1)

## 2021-05-02 LAB — BASIC METABOLIC PANEL
BUN: 25 mg/dL — ABNORMAL HIGH (ref 6–23)
CO2: 39 mEq/L — ABNORMAL HIGH (ref 19–32)
Calcium: 9.7 mg/dL (ref 8.4–10.5)
Chloride: 99 mEq/L (ref 96–112)
Creatinine, Ser: 0.44 mg/dL (ref 0.40–1.20)
GFR: 95.66 mL/min (ref >60.00)
Glucose, Bld: 132 mg/dL — ABNORMAL HIGH (ref 70–99)
Potassium: 4.1 mEq/L (ref 3.5–5.1)
Sodium: 143 mEq/L (ref 135–145)

## 2021-05-02 LAB — BRAIN NATRIURETIC PEPTIDE: B Natriuretic Peptide: 132.3 pg/mL — ABNORMAL HIGH (ref 0.0–100.0)

## 2021-05-02 LAB — D-DIMER, QUANTITATIVE: D-Dimer, Quant: 3.04 mcg/mL FEU — ABNORMAL HIGH (ref <0.50)

## 2021-05-02 LAB — TROPONIN I (HIGH SENSITIVITY): Troponin I (High Sensitivity): 10 ng/L (ref <18)

## 2021-05-02 MED ORDER — PREDNISONE 20 MG PO TABS
60.0000 mg | ORAL_TABLET | Freq: Once | ORAL | Status: AC
  Administered 2021-05-02: 60 mg via ORAL
  Filled 2021-05-02: qty 3

## 2021-05-02 MED ORDER — ALBUTEROL SULFATE HFA 108 (90 BASE) MCG/ACT IN AERS
4.0000 | INHALATION_SPRAY | Freq: Once | RESPIRATORY_TRACT | Status: AC
  Administered 2021-05-02: 4 via RESPIRATORY_TRACT
  Filled 2021-05-02: qty 6.7

## 2021-05-02 MED ORDER — PREDNISONE 10 MG PO TABS: ORAL_TABLET | ORAL | 0 refills | Status: AC

## 2021-05-02 NOTE — Addendum Note (Signed)
Addended by: Suzzanne Cloud E on: 05/01/2021 03:08 PM   Modules accepted: Orders

## 2021-05-02 NOTE — Telephone Encounter (Signed)
Called and spoke with patient to let her know of recs from Dr. Annamaria Boots. She expressed understanding and will come today after 2 pm for CXR and labs. She verified preferred pharmacy for prednisone RX. RX sent. Nothing further needed at this time.

## 2021-05-02 NOTE — ED Triage Notes (Signed)
Pt arrived POV complaining of increased shortness of breath, fatigue and wheezing for past 3days  Pt is normally on 2.5L [place] for COPD but has needed 4L over past couple of days.   Pt has been using nebs and inhalers without relief

## 2021-05-02 NOTE — ED Provider Notes (Signed)
Emergency Medicine Provider Triage Evaluation Note  Lisa Hamilton , a 75 y.o. female  was evaluated in triage.  Pt complains of gradual onset, constant, worsening shortness of breath for the past few days.  Patient also reports wheezing.  She is typically on 2.5 L nasal cannula chronically for COPD and asthma however has felt like she has a need to increase to 4 L over the past couple of days.  She also complains of productive cough.  She denies fevers or chills.  She has been using her breathing treatments without relief.  She last used her breathing treatment around 4 AM this morning.  Does states she is having some chest pain however describes it more as chest tightness than overall chest pain.  Review of Systems  Positive: + SOB, cough, chest tightness, wheezing Negative: - fevers, chills  Physical Exam  BP (!) 153/76 (BP Location: Left Arm)   Pulse (!) 101   Temp (!) 97.5 F (36.4 C) (Oral)   Resp 19   SpO2 92%  Gen:   Awake, no distress   Resp:  Speaking in shorter sentences. On 4L [place] currently. Diminished breath sounds throughout.  MSK:   Moves extremities without difficulty  Other:    Medical Decision Making  Medically screening exam initiated at 6:45 PM.  Appropriate orders placed.  Lisa Hamilton was informed that the remainder of the evaluation will be completed by another provider, this initial triage assessment does not replace that evaluation, and the importance of remaining in the ED until their evaluation is complete.     Lisa Maize, PA-C 04/26/2021 1847    Lisa Ferguson, MD 04/28/2021 2137

## 2021-05-02 NOTE — Telephone Encounter (Signed)
Called and spoke with patient who is calling because she is having symptoms of shortness of breath and chest congestion that started 3 days ago. Patient has cough but states sputum is clear. No fevers. Normally wears 3 1/2 at night and was wearing 2 liters during the day and has increased it to 3 liters. Patient has been taking mucinex expectorant since symptoms started.    Dr. Annamaria Boots please advise

## 2021-05-02 NOTE — ED Notes (Signed)
Pt found in lobby with empty oxygen tank, this NT checked pt oxygen saturation and pt oxygen saturation was at 64%. Pt placed on full tank at 6 liters for several minutes. Pt saturation now at 96% back on 4L. Triage RN notified.

## 2021-05-02 NOTE — Telephone Encounter (Signed)
Can she come by for labs?     Dx Acute on Chronic Respiratory Failure with hypoxia    Order CXR                 CBC w diff, BMET, BNP, D-dimer  Then we can order prednisone 10 mg, # 20, 4 X 2 DAYS, 3 X 2 DAYS, 2 X 2 DAYS, 1 X 2 DAYS If she gets worse before we get labs back, go to ER

## 2021-05-02 NOTE — Addendum Note (Signed)
Addended by: Suzzanne Cloud E on: 04/11/2021 03:07 PM   Modules accepted: Orders

## 2021-05-02 NOTE — Addendum Note (Signed)
Addended by: Elby Beck R on: 04/26/2021 03:15 PM   Modules accepted: Orders

## 2021-05-02 NOTE — Telephone Encounter (Signed)
Patient came into the office for labs and CXR and had several questions and wanted to talk to nurse. I went and talked to patient who stated that she felt worse than she did this morning and that she was worried that she would not be able to go through the night without going to ED. I went to see if Dr. Annamaria Boots was here and he was. Talked to him about patient and told him that patient seemed very sleepy. He went with me to talk to patient and advised patient and husband to go to ED for further evaluation. I assisted getting patient into car and called charge nurse at [facility] and made him aware. Will also route to Dr. Annamaria Boots.

## 2021-05-03 ENCOUNTER — Inpatient Hospital Stay (HOSPITAL_COMMUNITY): Payer: Medicare Other

## 2021-05-03 ENCOUNTER — Encounter (HOSPITAL_COMMUNITY): Payer: Self-pay | Admitting: Internal Medicine

## 2021-05-03 ENCOUNTER — Encounter: Payer: Self-pay | Admitting: *Deleted

## 2021-05-03 DIAGNOSIS — J90 Pleural effusion, not elsewhere classified: Secondary | ICD-10-CM | POA: Diagnosis not present

## 2021-05-03 DIAGNOSIS — J9622 Acute and chronic respiratory failure with hypercapnia: Secondary | ICD-10-CM

## 2021-05-03 DIAGNOSIS — I517 Cardiomegaly: Secondary | ICD-10-CM | POA: Diagnosis not present

## 2021-05-03 DIAGNOSIS — Z452 Encounter for adjustment and management of vascular access device: Secondary | ICD-10-CM | POA: Diagnosis not present

## 2021-05-03 DIAGNOSIS — Z4682 Encounter for fitting and adjustment of non-vascular catheter: Secondary | ICD-10-CM | POA: Diagnosis not present

## 2021-05-03 DIAGNOSIS — I10 Essential (primary) hypertension: Secondary | ICD-10-CM | POA: Diagnosis not present

## 2021-05-03 DIAGNOSIS — J4531 Mild persistent asthma with (acute) exacerbation: Secondary | ICD-10-CM | POA: Diagnosis present

## 2021-05-03 DIAGNOSIS — J9811 Atelectasis: Secondary | ICD-10-CM | POA: Diagnosis not present

## 2021-05-03 DIAGNOSIS — I129 Hypertensive chronic kidney disease with stage 1 through stage 4 chronic kidney disease, or unspecified chronic kidney disease: Secondary | ICD-10-CM | POA: Diagnosis present

## 2021-05-03 DIAGNOSIS — T17890A Other foreign object in other parts of respiratory tract causing asphyxiation, initial encounter: Secondary | ICD-10-CM | POA: Diagnosis not present

## 2021-05-03 DIAGNOSIS — K567 Ileus, unspecified: Secondary | ICD-10-CM | POA: Diagnosis not present

## 2021-05-03 DIAGNOSIS — J80 Acute respiratory distress syndrome: Secondary | ICD-10-CM | POA: Diagnosis not present

## 2021-05-03 DIAGNOSIS — E875 Hyperkalemia: Secondary | ICD-10-CM | POA: Diagnosis not present

## 2021-05-03 DIAGNOSIS — Z66 Do not resuscitate: Secondary | ICD-10-CM | POA: Diagnosis not present

## 2021-05-03 DIAGNOSIS — M419 Scoliosis, unspecified: Secondary | ICD-10-CM | POA: Diagnosis not present

## 2021-05-03 DIAGNOSIS — Z7189 Other specified counseling: Secondary | ICD-10-CM | POA: Diagnosis not present

## 2021-05-03 DIAGNOSIS — G931 Anoxic brain damage, not elsewhere classified: Secondary | ICD-10-CM | POA: Diagnosis not present

## 2021-05-03 DIAGNOSIS — E662 Morbid (severe) obesity with alveolar hypoventilation: Secondary | ICD-10-CM | POA: Diagnosis present

## 2021-05-03 DIAGNOSIS — I3139 Other pericardial effusion (noninflammatory): Secondary | ICD-10-CM | POA: Diagnosis not present

## 2021-05-03 DIAGNOSIS — I469 Cardiac arrest, cause unspecified: Secondary | ICD-10-CM

## 2021-05-03 DIAGNOSIS — G9341 Metabolic encephalopathy: Secondary | ICD-10-CM | POA: Diagnosis not present

## 2021-05-03 DIAGNOSIS — J4530 Mild persistent asthma, uncomplicated: Secondary | ICD-10-CM

## 2021-05-03 DIAGNOSIS — J9621 Acute and chronic respiratory failure with hypoxia: Secondary | ICD-10-CM

## 2021-05-03 DIAGNOSIS — J939 Pneumothorax, unspecified: Secondary | ICD-10-CM | POA: Diagnosis not present

## 2021-05-03 DIAGNOSIS — E7800 Pure hypercholesterolemia, unspecified: Secondary | ICD-10-CM | POA: Diagnosis not present

## 2021-05-03 DIAGNOSIS — I959 Hypotension, unspecified: Secondary | ICD-10-CM | POA: Diagnosis not present

## 2021-05-03 DIAGNOSIS — R001 Bradycardia, unspecified: Secondary | ICD-10-CM | POA: Diagnosis not present

## 2021-05-03 DIAGNOSIS — J690 Pneumonitis due to inhalation of food and vomit: Secondary | ICD-10-CM | POA: Diagnosis not present

## 2021-05-03 DIAGNOSIS — N189 Chronic kidney disease, unspecified: Secondary | ICD-10-CM | POA: Diagnosis present

## 2021-05-03 DIAGNOSIS — N200 Calculus of kidney: Secondary | ICD-10-CM | POA: Diagnosis not present

## 2021-05-03 DIAGNOSIS — I3131 Malignant pericardial effusion in diseases classified elsewhere: Secondary | ICD-10-CM | POA: Diagnosis not present

## 2021-05-03 DIAGNOSIS — R042 Hemoptysis: Secondary | ICD-10-CM | POA: Diagnosis not present

## 2021-05-03 DIAGNOSIS — F39 Unspecified mood [affective] disorder: Secondary | ICD-10-CM | POA: Diagnosis present

## 2021-05-03 DIAGNOSIS — R570 Cardiogenic shock: Secondary | ICD-10-CM | POA: Diagnosis not present

## 2021-05-03 DIAGNOSIS — J189 Pneumonia, unspecified organism: Secondary | ICD-10-CM | POA: Diagnosis not present

## 2021-05-03 DIAGNOSIS — N179 Acute kidney failure, unspecified: Secondary | ICD-10-CM | POA: Diagnosis not present

## 2021-05-03 DIAGNOSIS — J441 Chronic obstructive pulmonary disease with (acute) exacerbation: Secondary | ICD-10-CM | POA: Diagnosis not present

## 2021-05-03 DIAGNOSIS — J9690 Respiratory failure, unspecified, unspecified whether with hypoxia or hypercapnia: Secondary | ICD-10-CM | POA: Diagnosis not present

## 2021-05-03 DIAGNOSIS — R918 Other nonspecific abnormal finding of lung field: Secondary | ICD-10-CM | POA: Diagnosis not present

## 2021-05-03 DIAGNOSIS — Z9911 Dependence on respirator [ventilator] status: Secondary | ICD-10-CM | POA: Diagnosis not present

## 2021-05-03 DIAGNOSIS — I700 Atherosclerosis of aorta: Secondary | ICD-10-CM | POA: Diagnosis not present

## 2021-05-03 DIAGNOSIS — J810 Acute pulmonary edema: Secondary | ICD-10-CM | POA: Diagnosis not present

## 2021-05-03 DIAGNOSIS — Z20822 Contact with and (suspected) exposure to covid-19: Secondary | ICD-10-CM | POA: Diagnosis not present

## 2021-05-03 DIAGNOSIS — R197 Diarrhea, unspecified: Secondary | ICD-10-CM | POA: Diagnosis not present

## 2021-05-03 DIAGNOSIS — R569 Unspecified convulsions: Secondary | ICD-10-CM | POA: Diagnosis not present

## 2021-05-03 DIAGNOSIS — I480 Paroxysmal atrial fibrillation: Secondary | ICD-10-CM | POA: Diagnosis not present

## 2021-05-03 DIAGNOSIS — I509 Heart failure, unspecified: Secondary | ICD-10-CM | POA: Diagnosis not present

## 2021-05-03 DIAGNOSIS — G2581 Restless legs syndrome: Secondary | ICD-10-CM | POA: Diagnosis present

## 2021-05-03 DIAGNOSIS — R0600 Dyspnea, unspecified: Secondary | ICD-10-CM | POA: Diagnosis not present

## 2021-05-03 DIAGNOSIS — K6389 Other specified diseases of intestine: Secondary | ICD-10-CM | POA: Diagnosis not present

## 2021-05-03 DIAGNOSIS — J9601 Acute respiratory failure with hypoxia: Secondary | ICD-10-CM | POA: Diagnosis not present

## 2021-05-03 DIAGNOSIS — Z6841 Body Mass Index (BMI) 40.0 and over, adult: Secondary | ICD-10-CM | POA: Diagnosis not present

## 2021-05-03 DIAGNOSIS — N170 Acute kidney failure with tubular necrosis: Secondary | ICD-10-CM | POA: Diagnosis not present

## 2021-05-03 DIAGNOSIS — R0602 Shortness of breath: Secondary | ICD-10-CM | POA: Diagnosis not present

## 2021-05-03 DIAGNOSIS — Z515 Encounter for palliative care: Secondary | ICD-10-CM | POA: Diagnosis not present

## 2021-05-03 LAB — I-STAT VENOUS BLOOD GAS, ED
Acid-Base Excess: 8 mmol/L — ABNORMAL HIGH (ref 0.0–2.0)
Acid-Base Excess: 9 mmol/L — ABNORMAL HIGH (ref 0.0–2.0)
Bicarbonate: 41.2 mmol/L — ABNORMAL HIGH (ref 20.0–28.0)
Bicarbonate: 36.9 mmol/L — ABNORMAL HIGH (ref 20.0–28.0)
Calcium, Ion: 1.24 mmol/L (ref 1.15–1.40)
Calcium, Ion: 1.04 mmol/L — ABNORMAL LOW (ref 1.15–1.40)
HCT: 45 % (ref 36.0–46.0)
HCT: 41 % (ref 36.0–46.0)
Hemoglobin: 15.3 g/dL — ABNORMAL HIGH (ref 12.0–15.0)
Hemoglobin: 13.9 g/dL (ref 12.0–15.0)
O2 Saturation: 79 %
O2 Saturation: 99 %
Potassium: 5.6 mmol/L — ABNORMAL HIGH (ref 3.5–5.1)
Potassium: 5.1 mmol/L (ref 3.5–5.1)
Sodium: 143 mmol/L (ref 135–145)
Sodium: 141 mmol/L (ref 135–145)
TCO2: 44 mmol/L — ABNORMAL HIGH (ref 22–32)
TCO2: 39 mmol/L — ABNORMAL HIGH (ref 22–32)
pCO2, Ven: 107.3 mmHg (ref 44.0–60.0)
pCO2, Ven: 66.7 mmHg — ABNORMAL HIGH (ref 44.0–60.0)
pH, Ven: 7.192 — CL (ref 7.250–7.430)
pH, Ven: 7.351 (ref 7.250–7.430)
pO2, Ven: 57 mmHg — ABNORMAL HIGH (ref 32.0–45.0)
pO2, Ven: 134 mmHg — ABNORMAL HIGH (ref 32.0–45.0)

## 2021-05-03 LAB — RESP PANEL BY RT-PCR (FLU A&B, COVID) ARPGX2
Influenza A by PCR: NEGATIVE
Influenza B by PCR: NEGATIVE
SARS Coronavirus 2 by RT PCR: NEGATIVE

## 2021-05-03 LAB — PRO B NATRIURETIC PEPTIDE: NT-Pro BNP: 333 pg/mL — ABNORMAL HIGH (ref 0–301)

## 2021-05-03 LAB — CBG MONITORING, ED: Glucose-Capillary: 113 mg/dL — ABNORMAL HIGH (ref 70–99)

## 2021-05-03 MED ORDER — SODIUM CHLORIDE 0.9 % IV SOLN
2.0000 g | Freq: Three times a day (TID) | INTRAVENOUS | Status: DC
  Administered 2021-05-03 – 2021-05-04 (×2): 2 g via INTRAVENOUS
  Filled 2021-05-03 (×2): qty 2

## 2021-05-03 MED ORDER — ONDANSETRON HCL 4 MG/2ML IJ SOLN: 4.0000 mg | Freq: Four times a day (QID) | INTRAMUSCULAR | Status: DC | PRN

## 2021-05-03 MED ORDER — CLONAZEPAM 0.5 MG PO TABS
1.0000 mg | ORAL_TABLET | Freq: Every day | ORAL | Status: DC
  Administered 2021-05-03: 1 mg via ORAL
  Filled 2021-05-03: qty 2

## 2021-05-03 MED ORDER — MORPHINE SULFATE (PF) 2 MG/ML IV SOLN: 2.0000 mg | INTRAVENOUS | Status: DC | PRN

## 2021-05-03 MED ORDER — ONDANSETRON HCL 4 MG PO TABS: 4.0000 mg | ORAL_TABLET | Freq: Four times a day (QID) | ORAL | Status: DC | PRN

## 2021-05-03 MED ORDER — SODIUM CHLORIDE 0.9 % IV SOLN
2.0000 g | Freq: Once | INTRAVENOUS | Status: AC
  Administered 2021-05-03: 2 g via INTRAVENOUS
  Filled 2021-05-03: qty 2

## 2021-05-03 MED ORDER — ZOLPIDEM TARTRATE 5 MG PO TABS: 5.0000 mg | ORAL_TABLET | Freq: Every evening | ORAL | Status: DC | PRN

## 2021-05-03 MED ORDER — DONEPEZIL HCL 10 MG PO TABS
10.0000 mg | ORAL_TABLET | Freq: Every day | ORAL | Status: DC
  Administered 2021-05-03: 10 mg via ORAL
  Filled 2021-05-03: qty 1

## 2021-05-03 MED ORDER — VANCOMYCIN HCL IN DEXTROSE 1-5 GM/200ML-% IV SOLN
1000.0000 mg | INTRAVENOUS | Status: DC
Start: 2021-05-04 — End: 2021-05-03

## 2021-05-03 MED ORDER — BISACODYL 5 MG PO TBEC: 5.0000 mg | DELAYED_RELEASE_TABLET | Freq: Every day | ORAL | Status: DC | PRN

## 2021-05-03 MED ORDER — IPRATROPIUM-ALBUTEROL 0.5-2.5 (3) MG/3ML IN SOLN
3.0000 mL | Freq: Four times a day (QID) | RESPIRATORY_TRACT | Status: DC
  Administered 2021-05-03 (×2): 3 mL via RESPIRATORY_TRACT
  Filled 2021-05-03 (×2): qty 3

## 2021-05-03 MED ORDER — PREDNISONE 20 MG PO TABS: 40.0000 mg | ORAL_TABLET | Freq: Every day | ORAL | Status: DC

## 2021-05-03 MED ORDER — LACTATED RINGERS IV SOLN: INTRAVENOUS | Status: AC

## 2021-05-03 MED ORDER — VANCOMYCIN HCL 10 G IV SOLR
20.0000 mg/kg | Freq: Once | INTRAVENOUS | Status: AC
  Administered 2021-05-03: 2000 mg via INTRAVENOUS
  Filled 2021-05-03: qty 20

## 2021-05-03 MED ORDER — GABAPENTIN 300 MG PO CAPS
300.0000 mg | ORAL_CAPSULE | Freq: Three times a day (TID) | ORAL | Status: DC
  Administered 2021-05-03: 300 mg via ORAL
  Filled 2021-05-03: qty 1

## 2021-05-03 MED ORDER — ENOXAPARIN SODIUM 40 MG/0.4ML IJ SOSY: 40.0000 mg | PREFILLED_SYRINGE | INTRAMUSCULAR | Status: DC

## 2021-05-03 MED ORDER — SODIUM CHLORIDE 0.9 % IV SOLN: 500.0000 mg | Freq: Once | INTRAVENOUS | Status: DC

## 2021-05-03 MED ORDER — GUAIFENESIN ER 600 MG PO TB12: 600.0000 mg | ORAL_TABLET | Freq: Two times a day (BID) | ORAL | Status: DC | PRN

## 2021-05-03 MED ORDER — FLUTICASONE PROPIONATE 50 MCG/ACT NA SUSP: 2.0000 | Freq: Every day | NASAL | Status: DC

## 2021-05-03 MED ORDER — FENTANYL CITRATE PF 50 MCG/ML IJ SOSY: 50.0000 ug | PREFILLED_SYRINGE | INTRAMUSCULAR | Status: DC | PRN

## 2021-05-03 MED ORDER — ALBUTEROL SULFATE (2.5 MG/3ML) 0.083% IN NEBU: 10.0000 mg/h | INHALATION_SOLUTION | RESPIRATORY_TRACT | Status: AC

## 2021-05-03 MED ORDER — HEPARIN (PORCINE) 25000 UT/250ML-% IV SOLN: 1100.0000 [IU]/h | INTRAVENOUS | Status: DC

## 2021-05-03 MED ORDER — HYDROCODONE-ACETAMINOPHEN 5-325 MG PO TABS
1.0000 | ORAL_TABLET | ORAL | Status: DC | PRN
Start: 2021-05-03 — End: 2021-05-04

## 2021-05-03 MED ORDER — SODIUM CHLORIDE 0.9% FLUSH
3.0000 mL | Freq: Two times a day (BID) | INTRAVENOUS | Status: DC
  Administered 2021-05-04 – 2021-05-13 (×13): 3 mL via INTRAVENOUS
  Administered 2021-05-14 – 2021-05-15 (×2): 10 mL via INTRAVENOUS
  Administered 2021-05-15 – 2021-05-16 (×2): 3 mL via INTRAVENOUS

## 2021-05-03 MED ORDER — SODIUM CHLORIDE 0.9 % IV SOLN: 2.0000 g | Freq: Three times a day (TID) | INTRAVENOUS | Status: DC

## 2021-05-03 MED ORDER — ALBUTEROL SULFATE (2.5 MG/3ML) 0.083% IN NEBU
INHALATION_SOLUTION | RESPIRATORY_TRACT | Status: AC
  Administered 2021-05-03: 10 mg/h via RESPIRATORY_TRACT
  Filled 2021-05-03: qty 12

## 2021-05-03 MED ORDER — MIDAZOLAM HCL 2 MG/2ML IJ SOLN: 1.0000 mg | INTRAMUSCULAR | Status: DC | PRN

## 2021-05-03 MED ORDER — HEPARIN BOLUS VIA INFUSION
4000.0000 [IU] | Freq: Once | INTRAVENOUS | Status: DC
  Filled 2021-05-03: qty 4000

## 2021-05-03 MED ORDER — LEVOFLOXACIN IN D5W 750 MG/150ML IV SOLN: 750.0000 mg | Freq: Once | INTRAVENOUS | Status: DC

## 2021-05-03 MED ORDER — EPINEPHRINE 1 MG/10ML IJ SOSY
PREFILLED_SYRINGE | INTRAMUSCULAR | Status: AC | PRN
  Administered 2021-05-03 (×2): 1 mg via INTRAVENOUS

## 2021-05-03 MED ORDER — ACETAMINOPHEN 325 MG PO TABS: 650.0000 mg | ORAL_TABLET | Freq: Four times a day (QID) | ORAL | Status: DC | PRN

## 2021-05-03 MED ORDER — HYDRALAZINE HCL 20 MG/ML IJ SOLN: 5.0000 mg | INTRAMUSCULAR | Status: DC | PRN

## 2021-05-03 MED ORDER — PRAVASTATIN SODIUM 40 MG PO TABS: 40.0000 mg | ORAL_TABLET | Freq: Every day | ORAL | Status: DC

## 2021-05-03 MED ORDER — ASPIRIN EC 81 MG PO TBEC: 81.0000 mg | DELAYED_RELEASE_TABLET | Freq: Every day | ORAL | Status: DC

## 2021-05-03 MED ORDER — ACETAMINOPHEN 650 MG RE SUPP: 650.0000 mg | Freq: Four times a day (QID) | RECTAL | Status: DC | PRN

## 2021-05-03 MED ORDER — DOCUSATE SODIUM 100 MG PO CAPS
100.0000 mg | ORAL_CAPSULE | Freq: Two times a day (BID) | ORAL | Status: DC
  Administered 2021-05-03: 100 mg via ORAL
  Filled 2021-05-03: qty 1

## 2021-05-03 MED ORDER — POLYETHYLENE GLYCOL 3350 17 G PO PACK: 17.0000 g | PACK | Freq: Three times a day (TID) | ORAL | Status: DC | PRN

## 2021-05-03 MED ORDER — MIDAZOLAM HCL 2 MG/2ML IJ SOLN
1.0000 mg | INTRAMUSCULAR | Status: DC | PRN
Start: 2021-05-03 — End: 2021-05-04
  Filled 2021-05-03 (×2): qty 2

## 2021-05-03 MED ORDER — METHYLPREDNISOLONE SODIUM SUCC 125 MG IJ SOLR
60.0000 mg | Freq: Two times a day (BID) | INTRAMUSCULAR | Status: AC
  Administered 2021-05-03 – 2021-05-04 (×2): 60 mg via INTRAVENOUS
  Filled 2021-05-03 (×2): qty 2

## 2021-05-03 MED ORDER — MONTELUKAST SODIUM 10 MG PO TABS
10.0000 mg | ORAL_TABLET | Freq: Every evening | ORAL | Status: DC
  Administered 2021-05-03: 10 mg via ORAL
  Filled 2021-05-03: qty 1

## 2021-05-03 MED ORDER — ROPINIROLE HCL 1 MG PO TABS
0.5000 mg | ORAL_TABLET | ORAL | Status: DC | PRN
  Administered 2021-05-03: 0.5 mg via ORAL
  Filled 2021-05-03: qty 1

## 2021-05-03 MED ORDER — FLUTICASONE FUROATE-VILANTEROL 100-25 MCG/INH IN AEPB
1.0000 | INHALATION_SPRAY | Freq: Every day | RESPIRATORY_TRACT | Status: DC
  Filled 2021-05-03: qty 28

## 2021-05-03 MED ORDER — ALBUTEROL SULFATE (2.5 MG/3ML) 0.083% IN NEBU: 2.5000 mg | INHALATION_SOLUTION | RESPIRATORY_TRACT | Status: DC | PRN

## 2021-05-03 MED ORDER — SODIUM BICARBONATE 8.4 % IV SOLN
INTRAVENOUS | Status: AC | PRN
  Administered 2021-05-03: 100 meq via INTRAVENOUS

## 2021-05-03 NOTE — ED Notes (Signed)
CT notified the pt has a 20g IV in [facility]

## 2021-05-03 NOTE — Progress Notes (Addendum)
Patient currently on 6L [place]. Spo2 92%. Patient refused Bipap use at this time. Patient aware to call for RT if she feels SOB and needs BIPAP.  Prior to leaving room pt requested to be placed on BIPAP due to SOB. RT will continue to monitor.

## 2021-05-03 NOTE — Progress Notes (Signed)
Notified of events of code. Pt resuscitated by ER physician and staff. Intubated. Given 4 rounds of epi then had ROSC. NGT placed.  PCCM notified and will see pt for admission to ICU.  Attempted to call husband. No answer. No family at bedside.

## 2021-05-03 NOTE — Progress Notes (Signed)
Called patient, she was at the hospital.  Patient was sent to the hospital from the office.  Nothing further needed.

## 2021-05-03 NOTE — ED Provider Notes (Signed)
Was called to the patient's bedside as the patient was in cardiac arrest.  Patient received 4 rounds of epinephrine was given 2 A of bicarb and was intubated with return of spontaneous circulation.  NG tube was placed.  Will obtain x-ray.  Admitting team notified of event.  Cardiopulmonary Resuscitation (CPR) Procedure Note Directed/Performed by: Cecilio Asper I personally directed ancillary staff and/or performed CPR in an effort to regain return of spontaneous circulation and to maintain cardiac, neuro and systemic perfusion.   CRITICAL CARE Performed by: Cecilio Asper   Total critical care time: 35 minutes  Critical care time was exclusive of separately billable procedures and treating other patients.  Critical care was necessary to treat or prevent imminent or life-threatening deterioration.  Critical care was time spent personally by me on the following activities: development of treatment plan with patient and/or surrogate as well as nursing, discussions with consultants, evaluation of patient's response to treatment, examination of patient, obtaining history from patient or surrogate, ordering and performing treatments and interventions, ordering and review of laboratory studies, ordering and review of radiographic studies, pulse oximetry and re-evaluation of patient's condition.  Og  Date/Time: 05/03/2021 11:19 PM Performed by: Deno Etienne, DO Authorized by: Deno Etienne, DO  Consent: Verbal consent obtained. Risks and benefits: risks, benefits and alternatives were discussed Consent given by: patient Patient understanding: patient states understanding of the procedure being performed Patient consent: the patient's understanding of the procedure matches consent given Imaging studies: imaging studies available Required items: required blood products, implants, devices, and special equipment available Patient identity confirmed: verbally with patient Time out:  Immediately prior to procedure a "time out" was called to verify the correct patient, procedure, equipment, support staff and site/side marked as required. Preparation: Patient was prepped and draped in the usual sterile fashion. Local anesthesia used: no  Anesthesia: Local anesthesia used: no  Sedation: Patient sedated: no  Patient tolerance: patient tolerated the procedure well with no immediate complications      Deno Etienne, DO 05/03/21 2319

## 2021-05-03 NOTE — H&P (Addendum)
History and Physical    Lisa Hamilton LGX:[id number] DOB: 08/31/1945 DOA: 04/10/2021  PCP: Hoyt Koch, MD Consultants:  Annamaria Boots - pulmonology; Nelva Bush - orthopedics; Posey Pronto - ophthalmology; Sallee Provencal - ENT; Jeffie Pollock - urology Patient coming from:  Home - lives with husband; NOK: Husband,, Vadie Principato, [place]  Chief Complaint: SOB  HPI: Lisa Hamilton is a 75 y.o. female with medical history significant of OHS/asthma on 2.5L home O2; HTN; HLD;  and morbid obesity presenting with SOB.  She got her COVID booster recently and felt fine for the next 2 days and then started getting congested.  For the last 5 days, she has had wheezing, SOB, and felt poorly.  She went to Dr. Janee Morn office yesterday for labs and CXR and Dr. Annamaria Boots sent her to the ER.  She waited in the waiting room all night and was brought into a room this AM and started promptly on BIPAP.  She is currently on BIPAP and feels that the mask is uncomfortable, mildly better.    ED Course: COPD, moving no air, on BIPAP and improved.  May be able to transition soon.  Given 4 Duonebs, given steroids. 3-4 days of worsening SOB, PCP sent to ER yesterday.  Review of Systems: As per HPI; otherwise review of systems reviewed and negative.    COVID Vaccine Status:  Complete  Past Medical History:  Diagnosis Date   Anemia    Asthmatic bronchitis    Blood transfusion without reported diagnosis 12/2017   Cataract    Constipation    chronic   COPD (chronic obstructive pulmonary disease) (HCC)    Diverticulosis of colon    DJD (degenerative joint disease)    GERD (gastroesophageal reflux disease)    rare   Glaucoma 1977   EYE SURGERY  FOR TX OF GLAUCOMA--NO LONGER HAS GLAUCOMA   Hepatic cyst    s/p surgical intervention 05/2011 -[place]   Hiatal hernia    Hx of colonic polyps    Hyperlipidemia    Hypertension    Medullary sponge kidney    Nontoxic multinodular goiter    pt unsure of this/ per pt no one told her!   Obesity     Osteoporosis    Partial small bowel obstruction ([place]) 10/15/2017   Renal calculus    s/p lithotripsy 06/2011   Restless leg syndrome    Shingles 2009   Squamous cell carcinoma of wrist, right    scalp, right wrist   Venous insufficiency    Vitamin D deficiency     Past Surgical History:  Procedure Laterality Date   bilateral carpel tunnel repairs     Dr. Shellia Carwin  left--1999   right--2008   bilateral wrist fracture, right  1996   left 2001, Dr. Daylene Katayama   CATARACT EXTRACTION  04/07/13 R, 05/18/13 L   [facility]   COLONOSCOPY     COLONOSCOPY WITH PROPOFOL N/A 09/10/2018   Procedure: COLONOSCOPY WITH PROPOFOL;  Surgeon: Milus Banister, MD;  Location: [facility];  Service: Endoscopy;  Laterality: N/A;   eye surgery Left 11/22/2014   "MD pucker" repair   FOOT SURGERY     left foot surgery/ cut off part of the bone due to cleft foot   [place]  2012   umb hernia/ pt states it was done in 10/2017 again   INSERTION OF MESH N/A 10/15/2017   Procedure: INSERTION OF MESH;  Surgeon: Donnie Mesa,  MD;  Location: [facility];  Service: General;  Laterality: N/A;   LAPAROSCOPIC LIVER CYST FENESTRATION  05/2011   LAPAROSCOPIC PARTIAL HEPATECTOMY N/A 12/04/2017   Procedure: LAPAROSCOPIC PARTIAL HEPATECTOMY;  Surgeon: [name], MD;  Location: [facility];  Service: General;  Laterality: N/A;   LIPOMA EXCISION  08/13/2012   Procedure: EXCISION LIPOMA;  Surgeon: Magnus Sinning, MD;  Location: [facility];  Service: Orthopedics;  Laterality: Left;   LITHOTRIPSY  06/16/2018   POLYPECTOMY  09/10/2018   Procedure: POLYPECTOMY;  Surgeon: Milus Banister, MD;  Location: [facility];  Service: Endoscopy;;   SHOULDER OPEN ROTATOR CUFF REPAIR  08/13/2012   Procedure: ROTATOR CUFF REPAIR SHOULDER OPEN;  Surgeon: Magnus Sinning, MD;  Location: [facility];  Service: Orthopedics;  Laterality: Left;  LEFT SHOULDER ANTERIOR ACROMINECTOMY AND ROTATOR CUFF REPAIR AND  EXCISION LEFT SHOULDER LIPOMA   SQUAMOUS CELL CARCINOMA EXCISION  2010   L [place]   TONSILLECTOMY  1967   VENTRAL HERNIA REPAIR N/A 10/15/2017   Procedure: HERNIA REPAIR VENTRAL ADULT;  Surgeon: Donnie Mesa, MD;  Location: [facility] OR;  Service: General;  Laterality: N/A;    Social History   Socioeconomic History   Marital status: Married    Spouse name: Fritz Pickerel   Number of children: 2   Years of education: Not on file   Highest education level: Not on file  Occupational History   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  Tobacco Use   Smoking status: Never   Smokeless tobacco: Never  Vaping Use   Vaping Use: Never used  Substance and Sexual Activity   Alcohol use: Yes    Alcohol/week: 0.0 standard drinks    Comment: social use   Drug use: No   Sexual activity: Never  Other Topics Concern   Not on file  Social History Narrative   Not on file   Social Determinants of Health   Financial Resource Strain: Low Risk    Difficulty of Paying Living Expenses: Not very hard  Food Insecurity: No Food Insecurity   Worried About Running Out of Food in the Last Year: Never true   Ran Out of Food in the Last Year: Never true  Transportation Needs: No Transportation Needs   Lack of Transportation (Medical): No   Lack of Transportation (Non-Medical): No  Physical Activity: Inactive   Days of Exercise per Week: 0 days   Minutes of Exercise per Session: 0 min  Stress: No Stress Concern Present   Feeling of Stress : Not at all  Social Connections: Socially Integrated   Frequency of Communication with Friends and Family: More than three times a week   Frequency of Social Gatherings with Friends and Family: More than three times a week   Attends Religious Services: More than 4 times per year   Active Member of [employer] or Organizations: Yes   Attends Music therapist: More than 4 times per year   Marital Status: Married  Human resources officer Violence: Not on  file    Allergies  Allergen Reactions   Hydromorphone Hcl Other (See Comments)    Disorientation and delirium.    Penicillins Rash and Other (See Comments)    PATIENT HAS HAD A PCN REACTION WITH IMMEDIATE RASH, FACIAL/TONGUE/THROAT SWELLING, SOB, OR LIGHTHEADEDNESS WITH HYPOTENSION:  #  #  YES  #  #  Has patient had a PCN reaction causing severe rash involving mucus membranes or skin necrosis: No Has patient had a PCN  reaction that required hospitalization: No Has patient had a PCN reaction occurring within the last 10 years: No If all of the above answers are "NO", then may proceed with Cephalosporin use.    Adhesive [Tape] Rash and Other (See Comments)    Blisters The clear tape    Amoxicillin-Pot Clavulanate Rash   Codeine Other (See Comments)    nightmares    Family History  Problem Relation Age of Onset   Lung cancer Father    Colon cancer Father        possible colon cancer, but unsure   Lung cancer Sister    Irritable bowel syndrome Sister    Obstructive Sleep Apnea Sister    Osteoarthritis Sister    Esophageal cancer Neg Hx    Rectal cancer Neg Hx    Stomach cancer Neg Hx     Prior to Admission medications   Medication Sig Start Date End Date Taking? Authorizing Provider  acetaminophen (TYLENOL) 500 MG tablet Take 1,000 mg by mouth 3 (three) times daily as needed for moderate pain or headache.    [provider]  albuterol (PROVENTIL) (2.5 MG/3ML) 0.083% nebulizer solution USE 1 VIAL IN NEBULIZER 4 TIMES DAILY. Generic: VENTOLIN 09/20/20   Baird Lyons D, MD  albuterol (VENTOLIN HFA) 108 (90 Base) MCG/ACT inhaler USE 1 TO 2 INHALATIONS BY  MOUTH EVERY 6 HOURS AS  NEEDED FOR WHEEZING OR  SHORTNESS OF BREATH 07/06/20   Baird Lyons D, MD  alendronate (FOSAMAX) 70 MG tablet Take 1 tablet (70 mg total) by mouth every 7 (seven) days. Take with a full glass of water on an empty stomach. 11/10/20   Hoyt Koch, MD  aspirin EC 81 MG tablet Take 162 mg by  mouth at bedtime.    [provider]  Calcium Carb-Cholecalciferol 600-800 MG-UNIT TABS Take 1 tablet by mouth 2 (two) times daily.    [provider]  Cholecalciferol (VITAMIN D) 2000 units tablet Take 2,000 Units by mouth daily.    [provider]  clonazePAM (KLONOPIN) 0.5 MG tablet TAKE 1 TO 3 TABLETS BY  MOUTH 30 MINUTES BEFORE  BEDTIME 03/08/21   Baird Lyons D, MD  Co-Enzyme Q-10 100 MG CAPS Take 100 mg by mouth daily.    [provider]  Cranberry 180 MG CAPS Take 1 capsule by mouth daily.    [provider]  diclofenac Sodium (VOLTAREN) 1 % GEL  11/08/19   [provider]  donepezil (ARICEPT) 10 MG tablet Take 1 tablet (10 mg total) by mouth at bedtime. 09/15/20   Hoyt Koch, MD  fluticasone Asencion Islam) 50 MCG/ACT nasal spray Place 2 sprays into both nostrils daily. 01/08/19   Hoyt Koch, MD  fluticasone furoate-vilanterol (BREO ELLIPTA) 100-25 MCG/INH AEPB Inhale 1 puff then rinse mouth,once daily 01/04/21   Baird Lyons D, MD  furosemide (LASIX) 20 MG tablet Take 1 tablet (20 mg total) by mouth daily. Patient taking differently: Take 20 mg by mouth daily as needed. 12/09/19   Baird Lyons D, MD  gabapentin (NEURONTIN) 300 MG capsule Take 1 capsule (300 mg total) by mouth 3 (three) times daily. 07/06/20   Deneise Lever, MD  guaiFENesin (MUCINEX) 600 MG 12 hr tablet Take 600 mg by mouth 2 (two) times daily as needed (congestion).     [provider]  ibuprofen (ADVIL,MOTRIN) 200 MG tablet Take 400 mg by mouth 3 (three) times daily as needed for headache or moderate pain.  [provider]  losartan (COZAAR) 50 MG tablet Take 1 tablet (50 mg total) by mouth daily. 09/14/20   Hoyt Koch, MD  Magnesium 400 MG CAPS Take 400 mg by mouth 2 (two) times daily.     [provider]  montelukast (SINGULAIR) 10 MG tablet Take 1 tablet (10 mg total) by mouth every evening. 07/06/20   Deneise Lever, MD  Multiple Vitamins-Minerals (PRESERVISION AREDS 2) CAPS Take 1 capsule by mouth 2 (two) times daily.    [provider]  Omega-3 Fatty Acids (FISH OIL) 1200 MG CAPS Take 1,200 mg by mouth daily.    [provider]  OXYGEN Inhale 2 L into the lungs at bedtime.     [provider]  polyethylene glycol (MIRALAX / GLYCOLAX) packet Take 17 g by mouth 3 (three) times daily as needed for moderate constipation.    [provider]  pravastatin (PRAVACHOL) 40 MG tablet Take 1 tablet (40 mg total) by mouth daily. 09/14/20   Hoyt Koch, MD  predniSONE (DELTASONE) 10 MG tablet Take 4 tablets (40 mg total) by mouth daily with breakfast for 2 days, THEN 3 tablets (30 mg total) daily with breakfast for 2 days, THEN 2 tablets (20 mg total) daily with breakfast for 2 days, THEN 1 tablet (10 mg total) daily with breakfast for 2 days. 04/07/2021 05/10/21  Deneise Lever, MD  Propylene Glycol (SYSTANE BALANCE OP) Place 1 drop into both eyes daily as needed (dry eyes).    [provider]  rOPINIRole (REQUIP) 0.5 MG tablet Take up to 6 tablets by mouth daily as needed 07/06/20   Baird Lyons D, MD  traMADol (ULTRAM) 50 MG tablet Take 1 tablet (50 mg total) by mouth daily as needed. 07/08/17   Hoyt Koch, MD  Turmeric 500 MG CAPS Take 1 capsule by mouth daily.    [provider]  vitamin B-12 (CYANOCOBALAMIN) 1000 MCG tablet Take 1,000 mcg by mouth daily.    [provider]    Physical Exam: Vitals:   05/03/21 0830 05/03/21 0900 05/03/21 1000 05/03/21 1149  BP: 106/74 104/63 (!) 98/58   Pulse: (!) 132 (!) 137 94 (!) 117  Resp: 16 15 (!) 25 20  Temp:      TempSrc:      SpO2: 99% 95% (!) 89% 95%  Weight:      Height:         General:  Appears ill, on BIPAP Eyes:  PERRL, EOMI, normal lids, iris ENT:  grossly normal hearing, BIPAP in place Neck:  no LAD, masses or thyromegaly Cardiovascular:  RRR, no m/r/g. No LE  edema.  Respiratory:   Poor air movement despite BIPAP.  Mildly increased respiratory effort. Abdomen:  soft, NT, ND Skin:  no rash or induration seen on limited exam Musculoskeletal:  grossly normal tone BUE/BLE, good ROM, no bony abnormality Psychiatric:  blunted mood and affect, speech fluent and appropriate, AOx3 Neurologic:  CN 2-12 grossly intact, moves all extremities in coordinated fashion    Radiological Exams on Admission: Independently reviewed - see discussion in A/P where applicable  DG Chest 2 View  Result Date: 05/03/2021 CLINICAL DATA:  Cough shortness of breath and center of chest tightness for 3 days in a 75 year old female. EXAM: CHEST - 2 VIEW COMPARISON:  May 02, 2021. FINDINGS: Trachea slightly to the RIGHT of midline, image rotated slightly to the RIGHT. Accounting for imaged position cardiomediastinal contours are stable and obscured  by overlying soft tissues and basilar airspace disease and pleural effusions. The appearance of the chest is unchanged compared to the previous radiograph. Lung volumes remain low. On limited assessment there is no acute skeletal process. IMPRESSION: No change in the appearance of the chest compared to the previous study. Signs of pleural effusions and basilar airspace disease with low lung volumes. Electronically Signed   By: Zetta Bills M.D.   On: 04/06/2021 19:44   DG Chest 2 View  Result Date: 04/10/2021 CLINICAL DATA:  Shortness of breath. EXAM: CHEST - 2 VIEW COMPARISON:  Chest x-ray dated January 04, 2021. FINDINGS: Cardiomegaly. New moderate bilateral pleural effusions. No pneumothorax. No acute osseous abnormality. IMPRESSION: 1. New moderate bilateral pleural effusions. Electronically Signed   By: Titus Dubin M.D.   On: 04/14/2021 16:17    EKG: Independently reviewed.  NSR with rate 91; nonspecific ST changes with no evidence of acute ischemia   Labs on Admission: I have personally reviewed the available labs and imaging  studies at the time of the admission.  Pertinent labs:   CO2 35 BNP 132.3 HS troponin 10 Unremarkable CBC ABG: 7.192/107.3/41.2 -> 7.351/66.7/134/36.9 COVID/flu negative   Assessment/Plan Principal Problem:   Acute on chronic respiratory failure with hypoxia (HCC) Active Problems:   HYPERCHOLESTEROLEMIA   Morbid obesity (HCC)   Essential hypertension   Asthma in adult, mild persistent, uncomplicated   Mild cognitive disorder   Acute on chronic respiratory failure associated with asthma exacerbation -Patient's shortness of breath and wheezing are most likely caused by acute asthma exacerbation I conjunction with baseline OHS.  -She has history of O2-dependence, on 3L at baseline -She has history of asthma and sees pulmonology -She does not have fever or leukocytosis.  -Chest x-ray is not consistent with pneumonia but does show moderate B pleural effusions. -She was given repeated Duoneb treatments in the ED and placed on BIPAP with some improvement.   -will admit patient - it seems likely that she will need several days of hospitalization to show sufficient improvement for discharge. -Nebulizers: scheduled Duoneb and prn albuterol -Solu-Medrol 60 mg IV BID -> Prednisone 40 mg PO daily -IV Cefepime (was also given Vanc in the ER but this does not appear to need to be continued; will check MRSA PCR) -Continue Breo, Flonase, Mucinex -Coordinated care with TOC team/PT/OT/Nutrition/RT consults  HTN -Borderline BP while in the ER - hold home meds including Cozaar  HLD -Continue Pravachol -Hold fish oil due to limited inpatient utility  Mood d/o with cognitive impairment -Continue Klonopin, Aricept  Morbid obesity -Body mass index is 45.98 kg/m..  -Weight loss should be encouraged -Outpatient PCP/bariatric medicine f/u encouraged    *Addendum:  D-dimer from last evening was 3.  Will start empiric heparin drip per pharmacy and order STAT CTA.*   Note: This patient has  been tested and is negative for the novel coronavirus COVID-19. She has been fully vaccinated against COVID-19.    DVT prophylaxis: Lovenox  Code Status:  Full - confirmed with patient/husband Family Communication: Husband was present throughout evaluation Disposition Plan:  The patient is from: home  Anticipated d/c is to: home without [facility] services   Anticipated d/c date will depend on clinical response to treatment, but possibly in the next 2-3 days if she has excellent response to treatment  Patient is currently: acutely ill Consults called: TOC team/PT/OT/Nutrition/RT  Admission status: Admit - It is my clinical opinion that admission to INPATIENT is reasonable and necessary because this patient  will require at least 2 midnights in the hospital to treat this condition based on the medical complexity of the problems presented.  Given the aforementioned information, the predictability of an adverse outcome is felt to be significant.    Karmen Bongo MD [facility]   How to contact the [facility] Attending or Consulting provider [place] or covering provider during after hours [place], for this patient?  Check the care team in [facility] and look for a) attending/consulting [facility] provider listed and b) the [facility] team listed Log into [email or web address] and use [place]'s universal password to access. If you do not have the password, please contact the hospital operator. Locate the [facility] provider you are looking for under [facility] and page to a number that you can be directly reached. If you still have difficulty reaching the provider, please page the [facility] (Director on Call) for the Hospitalists listed on amion for assistance.   05/03/2021, 1:13 PM

## 2021-05-03 NOTE — Consult Note (Addendum)
NAME:  Lisa Hamilton, MRN:  [id number], DOB:  11-Oct-1945, LOS: 0 ADMISSION DATE:  05/01/2021, CONSULTATION DATE:  9/29 REFERRING MD:  Dr. Lorin Mercy, CHIEF COMPLAINT:  Cardiac arrest    05/04/2021 APP Hoffman and I saw and evaluated the patient. Discussed with APP and agree with APP's findings and plan as documented in the APP note.  I have seen and evaluated the patient following in hospital cardiac arrest and acute on chronic respiratory failure  S:   87F with history of chronic hypoxic/hypercapnic respiratory failure attributed ot asthma and OHS. Presented on 9/28 to the pulmonary clinic with complaint of dyspnea, wheeze and congestion for several days. Given lethargy and respiratory distress was sent to the ED for evaluation  She was found to have acute on chronic hypoxia with uncompensated respiratory acidosis. Initially improved with bipap and was treated for exacerbation of obstructive airways disease with steroids/nebs and antibiotics. Planned for CTA given elevated d-dimer.  Came of NIPPV overnight and requested not to be placed back on after taking home medications. Afterwards suffered a cardiac arrest without shockable rhythm requiring 15 minutes of ACLS prior to ROSC.   O: Blood pressure (!) 105/56, pulse (!) 103, temperature 99 F (37.2 C), resp. rate 20, height 5' (1.524 m), weight 224 lb 6.9 oz (101.8 kg), SpO2 94 %.   Exam: Gen:       Intubated. Not sedated. In no apparent distress HEENT:  anicteric sclera. Pupils constricted but equal and reactive Lungs:    symmetric chest expansion. Diminished in bases. CV:         regular rate and rhythm. No murmur. POCUS with difficult windows but moderate effusion likely present Abd:      + bowel sounds; soft, non-tender; no palpable masses, no distension Ext:    1+ Lower extremity edema Skin:       Warm and dry; no rash Neuro:    withdraws from noxious stimuli in all extremities. Pupils constricted but reactive. + gag. Does not follow  commands Psych:    unable to assess   A/P:   #In Hospital Cardiac Arrest --Presumed etiology: unclear at this time. Did initially present with acute on chronic respiratory failure. Event co-insided 1-2 hours after coming off NIPPV in the ED and taking sedating medication, so certainly could have been a respiratory arrest. No significant metabolic derangements otherwise beyond moderate hypervolemia --Time down: ~15 minutes prior to ROSC --Shockable rhythm reported: no, PEA/asystole --Initial neuro exam notable for: withdraws from noxious stimuli in all 4 extremities, pupils constricted but equal. + gag --POCUS notable for: poor cardiac windows but possible moderate pericardial effusion --Initial imaging notable for: CTA pending Management Plan: EEG monitoring. Consider neurology consultation in AM for neuroprognostication Targeted Temperature Management Protocol: goal normothermia (<37.5oC) x 72 hours Diagnostic studies ordered: formal TTE, CTA, EKG/troponins to cycle Hemodynamic goal: Maintain MAP > 65 mmHg Respiratory management: Lung protective ventilation, SpO2 goal > 88%, Avoid hyperoxia.  #Acute on Chronic Hypoxic and Hypercapnic Respiratory Failure --likely multifactorial secondary JS:HFWYOVZC volume overload in setting of HFpEF with bilateral effusions and BNP > 300, acute asthma exacerbation, and obesity hypoventilation syndrome.  No clear consolidation on CXR to suggest acute bacterial pneumonia. D-dimer modestly elevated so must consider VTE --Diagnostic plan: sputum culture/resp quant, MRSA nares PCR, Formal TTE, and CTA --Therapeutic plan: Diuresis as tolerated Prednisone 40mg  x 5 days ICS (budesonide) BID, duoneb q4hrs Continue cefepime until CT imaging obtained   Patient critically ill due to cardiac arrest and acute  on chronic respiratory  failure Interventions to address this today ACLS, Intubation with mechanical ventilation and TTM Risk of deterioration without  these interventions is high  I personally spent 62 minutes providing critical care not including any separately billable procedures   Lisa Hamilton [place]   History of Present Illness:  Patient is encephalopathic and/or intubated. Therefore history has been obtained from chart review.   75 year old female with PMH as below, which is significant for Asthma followed by Dr. Annamaria Boots on 2-3 liters oxygen, HTN, HLD, OHS, and morbid obesity. She presented to the pulmonary clinic on 9/28 with complaints of SOB, wheeze, and congestion. In the waiting room she was in some distress and was bordering on lethargic prompting transfer to ED. When she was eventually brought back from the waiting room, her level of dyspnea/somnolence prompted the initiation of BiPAP. Initial ABG consistent with severe respiratory acidosis with pH 7.19. Her mental status and ABG did improve with BiPAP. This was felt to be due to exacerbation of obstructive airway disease and she was treated with steroids and nebs. Antibiotics were also given until PNA/sepsis could be ruled out.   She was admitted to the hospitalist service, but remained in ED while awaiting bed placement. In the evening hours of 9/29 she was taken off BiPAP in order to take home meds, and requested not to be placed back on. She was in no distress and mental status was at baseline at that time. Shortly after, they telemetry tech noted bradycardia on her monitor, and by the time the RN arrived at bedside the patient was in asystole. Downtime approximately 15 mins. Epinephrine x 3, Bicarb x 2. Intubated during code. Unresponsive and VSS post arrest. PCCM asked to evaluation.   Pertinent  Medical History   has a past medical history of Anemia, Asthmatic bronchitis, Blood transfusion without reported diagnosis (12/2017), Cataract, Constipation, COPD (chronic obstructive pulmonary disease) ([place]), Diverticulosis of colon, DJD (degenerative joint  disease), GERD (gastroesophageal reflux disease), Glaucoma (1977), Hepatic cyst, Hiatal hernia, colonic polyps, Hyperlipidemia, Hypertension, Medullary sponge kidney, Nontoxic multinodular goiter, Obesity, Osteoporosis, Partial small bowel obstruction ([place]) (10/15/2017), Renal calculus, Restless leg syndrome, Shingles (2009), Squamous cell carcinoma of wrist, right, Venous insufficiency, and Vitamin D deficiency.   Significant Hospital Events: Including procedures, antibiotic start and stop dates in addition to other pertinent events     Interim History / Subjective:    Objective   Blood pressure 94/78, pulse (!) 107, temperature 97.6 F (36.4 C), temperature source Oral, resp. rate (!) 26, height 5' (1.524 m), weight 99.8 kg, SpO2 93 %.    Vent Mode: PRVC FiO2 (%):  [40 %-100 %] 100 % Set Rate:  [18 bmp] 18 bmp Vt Set:  [360 mL] 360 mL PEEP:  [5 cmH20] 5 cmH20 Plateau Pressure:  [29 cmH20] 29 cmH20  No intake or output data in the 24 hours ending 05/03/21 2351 Filed Weights   04/18/2021 1846  Weight: 99.8 kg    Examination: General: morbidly obese female on vent HENT: [place]/AT, PERRL,  no JVD Lungs: Coarse rhonchi throughout. No wheeze Cardiovascular: RRR, no MRG Abdomen: Soft, non-tender, non-distended Extremities: No acute deformity. 2+ lower extremity edema.  Neuro: unresponsive  Resolved Hospital Problem list     Assessment & Plan:   Cardiac arrest: asystole 15 mins. Etiology unclear.  - Admit to ICU - Echocardiogram - Neuro checks - TTM protocol - Repeat labs  Acute exacerbation of asthma Acute on chronic hypoxemic respiratory failure Acute  on chronic hypercarbic respiratory failure ? PE: low prob by [place].  - Full vent support - PRN fentanyl and versed for RASS goal -1 to -2. - ABG reviewed and settings adjusted.  - Follow ABG, CXR - Scheduled duonebs, budesonide - Systemic steroids - empiric antibiotics until pneumonia can be ruled out - CTA chest pending  with d dimer elevation and unknown cause of arrest.   Bilateral pleural effusions - diurese when BP can tolerate.  - may need to sample the fluid at some point.   HTN: HLD: - Holding home meds.   Best Practice (right click and "Reselect all SmartList Selections" daily)   Diet/type: NPO DVT prophylaxis: prophylactic heparin  GI prophylaxis: PPI Lines: N/A Foley:  Yes, and it is still needed Code Status:  full code Last date of multidisciplinary goals of care discussion [attempted to call husband]  Labs   CBC: Recent Labs  Lab 04/27/2021 1508 05/04/2021 1847 05/03/21 0749 05/03/21 1002  WBC 9.4 9.4  --   --   NEUTROABS 7.5 8.3*  --   --   HGB 11.9* 12.0 15.3* 13.9  HCT 38.9 42.7 45.0 41.0  MCV 96.1 105.7*  --   --   PLT 392.0 365  --   --     Basic Metabolic Panel: Recent Labs  Lab 04/27/2021 1508 04/12/2021 1847 05/03/21 0749 05/03/21 1002  NA 143 141 143 141  K 4.1 4.0 5.6* 5.1  CL 99 99  --   --   CO2 39* 35*  --   --   GLUCOSE 132* 145*  --   --   BUN 25* 24*  --   --   CREATININE 0.44 0.56  --   --   CALCIUM 9.7 9.4  --   --    GFR: Estimated Creatinine Clearance: 66.4 mL/min (by C-G formula based on SCr of 0.56 mg/dL). Recent Labs  Lab 04/18/2021 1508 04/06/2021 1847  WBC 9.4 9.4    Liver Function Tests: Recent Labs  Lab 04/08/2021 1847  AST 20  ALT 19  ALKPHOS 105  BILITOT 1.3*  PROT 6.8  ALBUMIN 3.3*   No results for input(s): LIPASE, AMYLASE in the last 168 hours. No results for input(s): AMMONIA in the last 168 hours.  ABG    Component Value Date/Time   HCO3 36.9 (H) 05/03/2021 1002   TCO2 39 (H) 05/03/2021 1002   O2SAT 99.0 05/03/2021 1002     Coagulation Profile: No results for input(s): INR, PROTIME in the last 168 hours.  Cardiac Enzymes: No results for input(s): CKTOTAL, CKMB, CKMBINDEX, TROPONINI in the last 168 hours.  HbA1C: Hgb A1c MFr Bld  Date/Time Value Ref Range Status  07/24/2020 10:38 AM 5.9 4.6 - 6.5 % Final     Comment:    Glycemic Control Guidelines for People with Diabetes:Non Diabetic:  <6%Goal of Therapy: <7%Additional Action Suggested:  >8%   01/08/2019 09:24 AM 5.9 4.6 - 6.5 % Final    Comment:    Glycemic Control Guidelines for People with Diabetes:Non Diabetic:  <6%Goal of Therapy: <7%Additional Action Suggested:  >8%     CBG: Recent Labs  Lab 05/03/21 2309  GLUCAP 113*    Review of Systems:   Patient is encephalopathic and/or intubated. Therefore history has been obtained from chart review.   Past Medical History:  She,  has a past medical history of Anemia, Asthmatic bronchitis, Blood transfusion without reported diagnosis (12/2017), Cataract, Constipation, COPD (chronic obstructive pulmonary disease) ([place]), Diverticulosis  of colon, DJD (degenerative joint disease), GERD (gastroesophageal reflux disease), Glaucoma (1977), Hepatic cyst, Hiatal hernia, colonic polyps, Hyperlipidemia, Hypertension, Medullary sponge kidney, Nontoxic multinodular goiter, Obesity, Osteoporosis, Partial small bowel obstruction ([place]) (10/15/2017), Renal calculus, Restless leg syndrome, Shingles (2009), Squamous cell carcinoma of wrist, right, Venous insufficiency, and Vitamin D deficiency.   Surgical History:   Past Surgical History:  Procedure Laterality Date   bilateral carpel tunnel repairs     Dr. Shellia Carwin  left--1999   right--2008   bilateral wrist fracture, right  1996   left 2001, Dr. Daylene Katayama   CATARACT EXTRACTION  04/07/13 R, 05/18/13 L   [facility]   COLONOSCOPY     COLONOSCOPY WITH PROPOFOL N/A 09/10/2018   Procedure: COLONOSCOPY WITH PROPOFOL;  Surgeon: Milus Banister, MD;  Location: [facility];  Service: Endoscopy;  Laterality: N/A;   eye surgery Left 11/22/2014   "MD pucker" repair   FOOT SURGERY     left foot surgery/ cut off part of the bone due to cleft foot   [place]  2012   umb hernia/ pt states it was done in 10/2017 again    INSERTION OF MESH N/A 10/15/2017   Procedure: INSERTION OF MESH;  Surgeon: Donnie Mesa, MD;  Location: [facility];  Service: General;  Laterality: N/A;   LAPAROSCOPIC LIVER CYST FENESTRATION  05/2011   LAPAROSCOPIC PARTIAL HEPATECTOMY N/A 12/04/2017   Procedure: LAPAROSCOPIC PARTIAL HEPATECTOMY;  Surgeon: [name], MD;  Location: [facility];  Service: General;  Laterality: N/A;   LIPOMA EXCISION  08/13/2012   Procedure: EXCISION LIPOMA;  Surgeon: Magnus Sinning, MD;  Location: [facility];  Service: Orthopedics;  Laterality: Left;   LITHOTRIPSY  06/16/2018   POLYPECTOMY  09/10/2018   Procedure: POLYPECTOMY;  Surgeon: Milus Banister, MD;  Location: [facility];  Service: Endoscopy;;   SHOULDER OPEN ROTATOR CUFF REPAIR  08/13/2012   Procedure: ROTATOR CUFF REPAIR SHOULDER OPEN;  Surgeon: Magnus Sinning, MD;  Location: [facility];  Service: Orthopedics;  Laterality: Left;  LEFT SHOULDER ANTERIOR ACROMINECTOMY AND ROTATOR CUFF REPAIR AND EXCISION LEFT SHOULDER LIPOMA   SQUAMOUS CELL CARCINOMA EXCISION  2010   L [place]   TONSILLECTOMY  1967   VENTRAL HERNIA REPAIR N/A 10/15/2017   Procedure: HERNIA REPAIR VENTRAL ADULT;  Surgeon: Donnie Mesa, MD;  Location: [facility];  Service: General;  Laterality: N/A;     Social History:   reports that she has never smoked. She has never used smokeless tobacco. She reports current alcohol use. She reports that she does not use drugs.   Family History:  Her family history includes Colon cancer in her father; Irritable bowel syndrome in her sister; Lung cancer in her father and sister; Obstructive Sleep Apnea in her sister; Osteoarthritis in her sister. There is no history of Esophageal cancer, Rectal cancer, or Stomach cancer.   Allergies Allergies  Allergen Reactions   Hydromorphone Hcl Other (See Comments)    Disorientation and delirium.    Penicillins Rash and Other (See Comments)    PATIENT HAS HAD A PCN REACTION WITH IMMEDIATE RASH, FACIAL/TONGUE/THROAT SWELLING,  SOB, OR LIGHTHEADEDNESS WITH HYPOTENSION:  #  #  YES  #  #  Has patient had a PCN reaction causing severe rash involving mucus membranes or skin necrosis: No Has patient had a PCN reaction that required hospitalization: No Has patient had a PCN reaction occurring within the last 10 years: No If all  of the above answers are "NO", then may proceed with Cephalosporin use.    Adhesive [Tape] Rash and Other (See Comments)    Blisters The clear tape    Amoxicillin-Pot Clavulanate Rash   Codeine Other (See Comments)    nightmares     Home Medications  Prior to Admission medications   Medication Sig Start Date End Date Taking? Authorizing Provider  acetaminophen (TYLENOL) 500 MG tablet Take 1,000 mg by mouth 3 (three) times daily as needed for moderate pain or headache.   Yes [provider]  albuterol (PROVENTIL) (2.5 MG/3ML) 0.083% nebulizer solution USE 1 VIAL IN NEBULIZER 4 TIMES DAILY. Generic: VENTOLIN Patient taking differently: Take 2.5 mg by nebulization 4 (four) times daily. 09/20/20  Yes Young, Tarri Fuller D, MD  albuterol (VENTOLIN HFA) 108 (90 Base) MCG/ACT inhaler USE 1 TO 2 INHALATIONS BY  MOUTH EVERY 6 HOURS AS  NEEDED FOR WHEEZING OR  SHORTNESS OF BREATH Patient taking differently: Inhale 1-2 puffs into the lungs every 6 (six) hours as needed for wheezing or shortness of breath. 07/06/20  Yes Young, Tarri Fuller D, MD  alendronate (FOSAMAX) 70 MG tablet Take 1 tablet (70 mg total) by mouth every 7 (seven) days. Take with a full glass of water on an empty stomach. Patient taking differently: Take 70 mg by mouth every Tuesday. Take with a full glass of water on an empty stomach. 11/10/20  Yes Hoyt Koch, MD  aspirin EC 81 MG tablet Take 81 mg by mouth at bedtime.   Yes [provider]  clonazePAM (KLONOPIN) 0.5 MG tablet TAKE 1 TO 3 TABLETS BY  MOUTH 30 MINUTES BEFORE  BEDTIME Patient taking differently: Take 0.5-1.5 mg by mouth See admin instructions. 30 MINUTES  BEFORE  BEDTIME 03/08/21  Yes Young, Tarri Fuller D, MD  Co-Enzyme Q-10 100 MG CAPS Take 100 mg by mouth daily.   Yes [provider]  Cranberry 180 MG CAPS Take 180 mg by mouth daily.   Yes [provider]  donepezil (ARICEPT) 10 MG tablet Take 1 tablet (10 mg total) by mouth at bedtime. 09/15/20  Yes Hoyt Koch, MD  fluticasone [facility]) 50 MCG/ACT nasal spray Place 2 sprays into both nostrils daily. Patient taking differently: Place 2 sprays into both nostrils daily as needed for allergies. 01/08/19  Yes Hoyt Koch, MD  fluticasone furoate-vilanterol (BREO ELLIPTA) 100-25 MCG/INH AEPB Inhale 1 puff then rinse mouth,once daily Patient taking differently: Inhale 1 puff into the lungs daily. 01/04/21  Yes Young, Tarri Fuller D, MD  gabapentin (NEURONTIN) 300 MG capsule Take 1 capsule (300 mg total) by mouth 3 (three) times daily. 07/06/20  Yes Young, Tarri Fuller D, MD  guaiFENesin (MUCINEX) 600 MG 12 hr tablet Take 600 mg by mouth 2 (two) times daily as needed (congestion).    Yes [provider]  ibuprofen (ADVIL,MOTRIN) 200 MG tablet Take 400 mg by mouth 3 (three) times daily as needed for headache or moderate pain.   Yes [provider]  losartan (COZAAR) 50 MG tablet Take 1 tablet (50 mg total) by mouth daily. 09/14/20  Yes Hoyt Koch, MD  Magnesium 400 MG CAPS Take 400 mg by mouth daily.   Yes [provider]  montelukast (SINGULAIR) 10 MG tablet Take 1 tablet (10 mg total) by mouth every evening. 07/06/20  Yes Young, Tarri Fuller D, MD  Multiple Vitamins-Minerals (PRESERVISION AREDS 2) CAPS Take 1 capsule by mouth 2 (two) times daily.   Yes [provider]  OXYGEN  Inhale 3-4 L into the lungs See admin instructions. 3 liters during the day 4 liters at bedtime   Yes [provider]  polyethylene glycol (MIRALAX / GLYCOLAX) packet Take 17 g by mouth 3 (three) times daily as needed for moderate constipation.   Yes [provider]  pravastatin (PRAVACHOL) 40 MG tablet Take 1 tablet (40 mg total) by mouth daily. 09/14/20  Yes Hoyt Koch, MD  Propylene Glycol (SYSTANE BALANCE OP) Place 1 drop into both eyes daily as needed (dry eyes).   Yes [provider]  rOPINIRole (REQUIP) 0.5 MG tablet Take up to 6 tablets by mouth daily as needed Patient taking differently: Take 1 mg by mouth 3 (three) times daily. 07/06/20  Yes Young, Tarri Fuller D, MD  traMADol (ULTRAM) 50 MG tablet Take 1 tablet (50 mg total) by mouth daily as needed. Patient taking differently: Take 50 mg by mouth daily as needed for moderate pain. 07/08/17  Yes Hoyt Koch, MD  Turmeric 500 MG CAPS Take 500 mg by mouth daily.   Yes [provider]  vitamin B-12 (CYANOCOBALAMIN) 1000 MCG tablet Take 1,000 mcg by mouth daily.   Yes [provider]  furosemide (LASIX) 20 MG tablet Take 1 tablet (20 mg total) by mouth daily. Patient not taking: No sig reported 12/09/19   Baird Lyons D, MD  predniSONE (DELTASONE) 10 MG tablet Take 4 tablets (40 mg total) by mouth daily with breakfast for 2 days, THEN 3 tablets (30 mg total) daily with breakfast for 2 days, THEN 2 tablets (20 mg total) daily with breakfast for 2 days, THEN 1 tablet (10 mg total) daily with breakfast for 2 days. 04/12/2021 05/10/21  Deneise Lever, MD     Critical care time: 40 minutes     Georgann Housekeeper, AGACNP-BC [facility]  See Amion for personal pager PCCM on call pager [phone] until 7pm. Please call Elink 7p-7a. [phone]  05/04/2021 12:42 AM

## 2021-05-03 NOTE — ED Notes (Signed)
Contacted RT about pt O2 sat on monitor. RT stated it hasn't been accurate. Pt is not in distress. She is Aox4.

## 2021-05-03 NOTE — ED Provider Notes (Signed)
  Physical Exam  BP 94/78 (BP Location: Left Arm)   Pulse 100   Temp 97.6 F (36.4 C) (Oral)   Resp (!) 24   Ht 4\' 10"  (1.473 m)   Wt 99.8 kg   SpO2 90%   BMI 45.98 kg/m   Physical Exam  ED Course/Procedures     Procedure Name: Intubation Date/Time: 05/03/2021 11:10 PM Performed by: Davonna Belling, MD Pre-anesthesia Checklist: Patient being monitored and Emergency Drugs available Oxygen Delivery Method: Ambu bag Preoxygenation: Pre-oxygenation with 100% oxygen Induction Type: Cricoid Pressure applied Ventilation: Mask ventilation without difficulty Laryngoscope Size: Glidescope and 3 Grade View: Grade II Tube type: Subglottic suction tube Tube size: 7.5 mm Number of attempts: 2 Dental Injury: Teeth and Oropharynx as per pre-operative assessment  Comments: Blood in tube after intubation     MDM  CPR called in ED. Pulseless. Patient is an inpatient hold. EPI given without initial results. Intubated by me. Somewhat difficult intubation. Did have return of vitals. Hospitalist notified. Did have bradycardia prior to arrest.          Davonna Belling, MD 05/03/21 2325

## 2021-05-03 NOTE — Progress Notes (Addendum)
Pharmacy Antibiotic Note  Lisa Hamilton is a 75 y.o. female admitted on 04/23/2021 with COPD exacerbation.  Pharmacy has been consulted for Cefepime dosing.  Plan: Cefepime 2 gm IV every 8 hours Monitor renal function, CBC F/u on LOT and cultures and sensitivities, de-escalate as able  Height: 4\' 10"  (147.3 cm) Weight: 99.8 kg (220 lb) IBW/kg (Calculated) : 40.9  Temp (24hrs), Avg:97.6 F (36.4 C), Min:97.5 F (36.4 C), Max:97.8 F (36.6 C)  Recent Labs  Lab 04/10/2021 1508 04/16/2021 1847  WBC 9.4 9.4  CREATININE 0.44 0.56     Estimated Creatinine Clearance: 63.8 mL/min (by C-G formula based on SCr of 0.56 mg/dL).    Allergies  Allergen Reactions   Hydromorphone Hcl Other (See Comments)    Disorientation and delirium.    Penicillins Rash and Other (See Comments)    PATIENT HAS HAD A PCN REACTION WITH IMMEDIATE RASH, FACIAL/TONGUE/THROAT SWELLING, SOB, OR LIGHTHEADEDNESS WITH HYPOTENSION:  #  #  YES  #  #  Has patient had a PCN reaction causing severe rash involving mucus membranes or skin necrosis: No Has patient had a PCN reaction that required hospitalization: No Has patient had a PCN reaction occurring within the last 10 years: No If all of the above answers are "NO", then may proceed with Cephalosporin use.    Adhesive [Tape] Rash and Other (See Comments)    Blisters The clear tape    Amoxicillin-Pot Clavulanate Rash   Codeine Other (See Comments)    nightmares    Antimicrobials this admission: Cefepime 9/29 >>  Vanc 9/29 x1  Dose adjustments this admission: none  Microbiology results: 9/29 MRSA PCR: 9/29 Sputum: (Both collected after first doses of vanc and cefepime)  Thank you for allowing pharmacy to be a part of this patient's care.  Laurey Arrow, PharmD PGY1 Pharmacy Resident 05/03/2021  9:13 AM  Please check [email or web address] for unit-specific pharmacy phone numbers.

## 2021-05-03 NOTE — ED Notes (Signed)
RT is in room working with pt

## 2021-05-03 NOTE — ED Provider Notes (Signed)
[facility] EMERGENCY DEPARTMENT Provider Note   CSN: [id number] Arrival date & time: 04/24/2021  1657     History Chief Complaint  Patient presents with   Shortness of Breath    Lisa Hamilton is a 75 y.o. female.   Shortness of Breath History is obtained from chart review and patient's husband.  Patient with history of asthma presenting for 3 days of worsening shortness of breath.  She has had increased home O2 requirement from 2 L to 4 L.  She has had an increased cough with clear mucus production.  She was started on prednisone.  She went to see her pulmonologist yesterday and was found to be somnolent.  She was advised to go to the ER for evaluation.  Unfortunately, patient had a prolonged wait time in the ED waiting room.  Upon being bedded in the ED, patient unable to provide history due to respiratory distress and somnolence.  Patient's husband confirms that she has had 3 days of worsening shortness of breath.  He states that, at baseline, patient is functional and quite active.    Past Medical History:  Diagnosis Date   Anemia    Asthmatic bronchitis    Blood transfusion without reported diagnosis 12/2017   Cataract    Constipation    chronic   COPD (chronic obstructive pulmonary disease) (HCC)    Diverticulosis of colon    DJD (degenerative joint disease)    GERD (gastroesophageal reflux disease)    rare   Glaucoma 1977   EYE SURGERY  FOR TX OF GLAUCOMA--NO LONGER HAS GLAUCOMA   Hepatic cyst    s/p surgical intervention 05/2011 -[place]   Hiatal hernia    Hx of colonic polyps    Hyperlipidemia    Hypertension    Medullary sponge kidney    Nontoxic multinodular goiter    pt unsure of this/ per pt no one told her!   Obesity    Osteoporosis    Partial small bowel obstruction ([place]) 10/15/2017   Renal calculus    s/p lithotripsy 06/2011   Restless leg syndrome    Shingles 2009   Squamous cell carcinoma of wrist, right    scalp, right wrist    Venous insufficiency    Vitamin D deficiency     Patient Active Problem List   Diagnosis Date Noted   Acute on chronic respiratory failure with hypoxia ([place]) 05/03/2021   Mild cognitive disorder 07/24/2020   Peripheral edema 02/12/2020   History of colonic polyps    Chronic respiratory failure with hypoxia ([place]) 03/04/2018   Nephrolithiasis 10/15/2017   Anemia 12/22/2015   Hyperglycemia 01/04/2014   NONTOXIC MULTINODULAR GOITER 08/06/2008   HYPERCHOLESTEROLEMIA 08/06/2008   RESTLESS LEG SYNDROME 08/06/2008   Essential hypertension 08/06/2008   Diverticulosis of colon without hemorrhage 08/06/2008   Liver cyst s/p partial hepatectomy 12/04/2017 08/06/2008   MEDULLARY SPONGE KIDNEY 08/06/2008   VITAMIN D DEFICIENCY 10/21/2007   Morbid obesity ([place]) 10/21/2007   GLAUCOMA 10/21/2007   Asthma in adult, mild persistent, uncomplicated 84/69/6295   Osteoarthritis 10/21/2007   Osteoporosis 10/21/2007    Past Surgical History:  Procedure Laterality Date   bilateral carpel tunnel repairs     Dr. Shellia Carwin  left--1999   right--2008   bilateral wrist fracture, right  1996   left 2001, Dr. Daylene Katayama   CATARACT EXTRACTION  04/07/13 R, 05/18/13 L   [place]   COLONOSCOPY     COLONOSCOPY WITH PROPOFOL N/A 09/10/2018   Procedure: COLONOSCOPY  WITH PROPOFOL;  Surgeon: Milus Banister, MD;  Location: [facility];  Service: Endoscopy;  Laterality: N/A;   eye surgery Left 11/22/2014   "MD pucker" repair   FOOT SURGERY     left foot surgery/ cut off part of the bone due to cleft foot   [place]  2012   umb hernia/ pt states it was done in 10/2017 again   INSERTION OF MESH N/A 10/15/2017   Procedure: INSERTION OF MESH;  Surgeon: Donnie Mesa, MD;  Location: [place];  Service: General;  Laterality: N/A;   LAPAROSCOPIC LIVER CYST FENESTRATION  05/2011   LAPAROSCOPIC PARTIAL HEPATECTOMY N/A 12/04/2017   Procedure: LAPAROSCOPIC PARTIAL HEPATECTOMY;   Surgeon: [name], MD;  Location: [place];  Service: General;  Laterality: N/A;   LIPOMA EXCISION  08/13/2012   Procedure: EXCISION LIPOMA;  Surgeon: Magnus Sinning, MD;  Location: [facility];  Service: Orthopedics;  Laterality: Left;   LITHOTRIPSY  06/16/2018   POLYPECTOMY  09/10/2018   Procedure: POLYPECTOMY;  Surgeon: Milus Banister, MD;  Location: [facility];  Service: Endoscopy;;   SHOULDER OPEN ROTATOR CUFF REPAIR  08/13/2012   Procedure: ROTATOR CUFF REPAIR SHOULDER OPEN;  Surgeon: Magnus Sinning, MD;  Location: [facility];  Service: Orthopedics;  Laterality: Left;  LEFT SHOULDER ANTERIOR ACROMINECTOMY AND ROTATOR CUFF REPAIR AND EXCISION LEFT SHOULDER LIPOMA   SQUAMOUS CELL CARCINOMA EXCISION  2010   L [place]   TONSILLECTOMY  1967   VENTRAL HERNIA REPAIR N/A 10/15/2017   Procedure: HERNIA REPAIR VENTRAL ADULT;  Surgeon: Donnie Mesa, MD;  Location: [place];  Service: General;  Laterality: N/A;     OB History   No obstetric history on file.     Family History  Problem Relation Age of Onset   Lung cancer Father    Colon cancer Father        possible colon cancer, but unsure   Lung cancer Sister    Irritable bowel syndrome Sister    Obstructive Sleep Apnea Sister    Osteoarthritis Sister    Esophageal cancer Neg Hx    Rectal cancer Neg Hx    Stomach cancer Neg Hx     Social History   Tobacco Use   Smoking status: Never   Smokeless tobacco: Never  Vaping Use   Vaping Use: Never used  Substance Use Topics   Alcohol use: Yes    Alcohol/week: 0.0 standard drinks    Comment: social use   Drug use: No    Home Medications Prior to Admission medications   Medication Sig Start Date End Date Taking? Authorizing Provider  acetaminophen (TYLENOL) 500 MG tablet Take 1,000 mg by mouth 3 (three) times daily as needed for moderate pain or headache.    [provider]  albuterol (PROVENTIL) (2.5 MG/3ML) 0.083% nebulizer solution USE 1 VIAL IN NEBULIZER 4 TIMES DAILY.  Generic: VENTOLIN 09/20/20   Baird Lyons D, MD  albuterol (VENTOLIN HFA) 108 (90 Base) MCG/ACT inhaler USE 1 TO 2 INHALATIONS BY  MOUTH EVERY 6 HOURS AS  NEEDED FOR WHEEZING OR  SHORTNESS OF BREATH 07/06/20   Baird Lyons D, MD  alendronate (FOSAMAX) 70 MG tablet Take 1 tablet (70 mg total) by mouth every 7 (seven) days. Take with a full glass of water on an empty stomach. 11/10/20   Hoyt Koch, MD  aspirin EC 81 MG tablet Take 162 mg by mouth at  bedtime.    [provider]  Calcium Carb-Cholecalciferol 600-800 MG-UNIT TABS Take 1 tablet by mouth 2 (two) times daily.    [provider]  Cholecalciferol (VITAMIN D) 2000 units tablet Take 2,000 Units by mouth daily.    [provider]  clonazePAM (KLONOPIN) 0.5 MG tablet TAKE 1 TO 3 TABLETS BY  MOUTH 30 MINUTES BEFORE  BEDTIME 03/08/21   Baird Lyons D, MD  Co-Enzyme Q-10 100 MG CAPS Take 100 mg by mouth daily.    [provider]  Cranberry 180 MG CAPS Take 1 capsule by mouth daily.    [provider]  diclofenac Sodium (VOLTAREN) 1 % GEL  11/08/19   [provider]  donepezil (ARICEPT) 10 MG tablet Take 1 tablet (10 mg total) by mouth at bedtime. 09/15/20   Hoyt Koch, MD  fluticasone Asencion Islam) 50 MCG/ACT nasal spray Place 2 sprays into both nostrils daily. 01/08/19   Hoyt Koch, MD  fluticasone furoate-vilanterol (BREO ELLIPTA) 100-25 MCG/INH AEPB Inhale 1 puff then rinse mouth,once daily 01/04/21   Baird Lyons D, MD  furosemide (LASIX) 20 MG tablet Take 1 tablet (20 mg total) by mouth daily. Patient taking differently: Take 20 mg by mouth daily as needed. 12/09/19   Baird Lyons D, MD  gabapentin (NEURONTIN) 300 MG capsule Take 1 capsule (300 mg total) by mouth 3 (three) times daily. 07/06/20   Deneise Lever, MD  guaiFENesin (MUCINEX) 600 MG 12 hr tablet Take 600 mg by mouth 2 (two) times daily as needed (congestion).     [provider]  ibuprofen  (ADVIL,MOTRIN) 200 MG tablet Take 400 mg by mouth 3 (three) times daily as needed for headache or moderate pain.    [provider]  losartan (COZAAR) 50 MG tablet Take 1 tablet (50 mg total) by mouth daily. 09/14/20   Hoyt Koch, MD  Magnesium 400 MG CAPS Take 400 mg by mouth 2 (two) times daily.     [provider]  montelukast (SINGULAIR) 10 MG tablet Take 1 tablet (10 mg total) by mouth every evening. 07/06/20   Deneise Lever, MD  Multiple Vitamins-Minerals (PRESERVISION AREDS 2) CAPS Take 1 capsule by mouth 2 (two) times daily.    [provider]  Omega-3 Fatty Acids (FISH OIL) 1200 MG CAPS Take 1,200 mg by mouth daily.    [provider]  OXYGEN Inhale 2 L into the lungs at bedtime.     [provider]  polyethylene glycol (MIRALAX / GLYCOLAX) packet Take 17 g by mouth 3 (three) times daily as needed for moderate constipation.    [provider]  pravastatin (PRAVACHOL) 40 MG tablet Take 1 tablet (40 mg total) by mouth daily. 09/14/20   Hoyt Koch, MD  predniSONE (DELTASONE) 10 MG tablet Take 4 tablets (40 mg total) by mouth daily with breakfast for 2 days, THEN 3 tablets (30 mg total) daily with breakfast for 2 days, THEN 2 tablets (20 mg total) daily with breakfast for 2 days, THEN 1 tablet (10 mg total) daily with breakfast for 2 days. 04/29/2021 05/10/21  Deneise Lever, MD  Propylene Glycol (SYSTANE BALANCE OP) Place 1 drop into both eyes daily as needed (dry eyes).    [provider]  rOPINIRole (REQUIP) 0.5 MG tablet Take up to 6 tablets by mouth daily as needed 07/06/20   Baird Lyons D, MD  traMADol (ULTRAM) 50 MG tablet Take 1 tablet (50 mg total) by mouth  daily as needed. 07/08/17   Hoyt Koch, MD  Turmeric 500 MG CAPS Take 1 capsule by mouth daily.    [provider]  vitamin B-12 (CYANOCOBALAMIN) 1000 MCG tablet Take 1,000 mcg by mouth daily.    [provider]     Allergies    Hydromorphone hcl, Penicillins, Adhesive [tape], Amoxicillin-pot clavulanate, and Codeine  Review of Systems   Review of Systems  Unable to perform ROS: Severe respiratory distress   Physical Exam Updated Vital Signs BP 126/68   Pulse 89   Temp 97.6 F (36.4 C) (Oral)   Resp 17   Ht 4\' 10"  (1.473 m)   Wt 99.8 kg   SpO2 97%   BMI 45.98 kg/m   Physical Exam Constitutional:      General: She is in acute distress.     Appearance: She is obese. She is ill-appearing. She is not diaphoretic.  HENT:     Head: Normocephalic and atraumatic.     Mouth/Throat:     Mouth: Mucous membranes are moist.     Pharynx: Oropharynx is clear.  Cardiovascular:     Rate and Rhythm: Regular rhythm. Tachycardia present.     Heart sounds: No murmur heard. Pulmonary:     Effort: Tachypnea, accessory muscle usage and respiratory distress present.     Breath sounds: Decreased breath sounds present.  Chest:     Chest wall: No tenderness or crepitus.  Abdominal:     Palpations: Abdomen is soft.     Tenderness: There is no abdominal tenderness.  Musculoskeletal:     Cervical back: Neck supple.     Right lower leg: Edema present.     Left lower leg: Edema (greater) present.  Skin:    Coloration: Skin is cyanotic and pale.  Neurological:     Comments: Somnolent, no focal deficits    ED Results / Procedures / Treatments   Labs (all labs ordered are listed, but only abnormal results are displayed) Labs Reviewed  COMPREHENSIVE METABOLIC PANEL - Abnormal; Notable for the following components:      Result Value   CO2 35 (*)    Glucose, Bld 145 (*)    BUN 24 (*)    Albumin 3.3 (*)    Total Bilirubin 1.3 (*)    All other components within normal limits  CBC WITH DIFFERENTIAL/PLATELET - Abnormal; Notable for the following components:   MCV 105.7 (*)    MCHC 28.1 (*)    Neutro Abs 8.3 (*)    Lymphs Abs 0.4 (*)    All other components within normal limits  BRAIN NATRIURETIC  PEPTIDE - Abnormal; Notable for the following components:   B Natriuretic Peptide 132.3 (*)    All other components within normal limits  I-STAT VENOUS BLOOD GAS, ED - Abnormal; Notable for the following components:   pH, Ven 7.192 (*)    pCO2, Ven 107.3 (*)    pO2, Ven 57.0 (*)    Bicarbonate 41.2 (*)    TCO2 44 (*)    Acid-Base Excess 8.0 (*)    Potassium 5.6 (*)    Hemoglobin 15.3 (*)    All other components within normal limits  I-STAT VENOUS BLOOD GAS, ED - Abnormal; Notable for the following components:   pCO2, Ven 66.7 (*)    pO2, Ven 134.0 (*)    Bicarbonate 36.9 (*)    TCO2 39 (*)    Acid-Base Excess 9.0 (*)    Calcium, Ion 1.04 (*)  All other components within normal limits  RESP PANEL BY RT-PCR (FLU A&B, COVID) ARPGX2  EXPECTORATED SPUTUM ASSESSMENT W GRAM STAIN, RFLX TO RESP C  MRSA NEXT GEN BY PCR, NASAL  HEPARIN LEVEL (UNFRACTIONATED)  BASIC METABOLIC PANEL  CBC  HEPARIN LEVEL (UNFRACTIONATED)  TROPONIN I (HIGH SENSITIVITY)  TROPONIN I (HIGH SENSITIVITY)    EKG EKG Interpretation  Date/Time:  Thursday May 03 2021 13:17:11 EDT Ventricular Rate:  95 PR Interval:  141 QRS Duration: 78 QT Interval:  416 QTC Calculation: 523 R Axis:   49 Text Interpretation: Sinus rhythm Low voltage, precordial leads Prolonged QT interval Confirmed by Godfrey Pick [phone]) on 05/03/2021 5:46:41 PM  Radiology DG Chest 2 View  Result Date: 05/01/2021 CLINICAL DATA:  Cough shortness of breath and center of chest tightness for 3 days in a 75 year old female. EXAM: CHEST - 2 VIEW COMPARISON:  May 02, 2021. FINDINGS: Trachea slightly to the RIGHT of midline, image rotated slightly to the RIGHT. Accounting for imaged position cardiomediastinal contours are stable and obscured by overlying soft tissues and basilar airspace disease and pleural effusions. The appearance of the chest is unchanged compared to the previous radiograph. Lung volumes remain low. On limited assessment  there is no acute skeletal process. IMPRESSION: No change in the appearance of the chest compared to the previous study. Signs of pleural effusions and basilar airspace disease with low lung volumes. Electronically Signed   By: Zetta Bills M.D.   On: 04/27/2021 19:44   DG Chest 2 View  Result Date: 04/16/2021 CLINICAL DATA:  Shortness of breath. EXAM: CHEST - 2 VIEW COMPARISON:  Chest x-ray dated January 04, 2021. FINDINGS: Cardiomegaly. New moderate bilateral pleural effusions. No pneumothorax. No acute osseous abnormality. IMPRESSION: 1. New moderate bilateral pleural effusions. Electronically Signed   By: Titus Dubin M.D.   On: 04/28/2021 16:17    Procedures Procedures   Medications Ordered in ED Medications  albuterol (PROVENTIL) (2.5 MG/3ML) 0.083% nebulizer solution (10 mg/hr Nebulization New Bag/Given 05/03/21 0833)  aspirin EC tablet 81 mg (has no administration in time range)  pravastatin (PRAVACHOL) tablet 40 mg (has no administration in time range)  donepezil (ARICEPT) tablet 10 mg (has no administration in time range)  polyethylene glycol (MIRALAX / GLYCOLAX) packet 17 g (has no administration in time range)  clonazePAM (KLONOPIN) tablet 1 mg (has no administration in time range)  gabapentin (NEURONTIN) capsule 300 mg (has no administration in time range)  fluticasone (FLONASE) 50 MCG/ACT nasal spray 2 spray (has no administration in time range)  fluticasone furoate-vilanterol (BREO ELLIPTA) 100-25 MCG/INH 1 puff (1 puff Inhalation Not Given 05/03/21 1302)  montelukast (SINGULAIR) tablet 10 mg (has no administration in time range)  ipratropium-albuterol (DUONEB) 0.5-2.5 (3) MG/3ML nebulizer solution 3 mL (3 mLs Nebulization Given 05/03/21 1430)  albuterol (PROVENTIL) (2.5 MG/3ML) 0.083% nebulizer solution 2.5 mg (has no administration in time range)  sodium chloride flush (NS) 0.9 % injection 3 mL (has no administration in time range)  lactated ringers infusion ( Intravenous New  Bag/Given 05/03/21 1443)  acetaminophen (TYLENOL) tablet 650 mg (has no administration in time range)    Or  acetaminophen (TYLENOL) suppository 650 mg (has no administration in time range)  HYDROcodone-acetaminophen (NORCO/VICODIN) 5-325 MG per tablet 1-2 tablet (has no administration in time range)  morphine 2 MG/ML injection 2 mg (has no administration in time range)  zolpidem (AMBIEN) tablet 5 mg (has no administration in time range)  docusate sodium (COLACE) capsule 100 mg (100 mg Oral  Not Given 05/03/21 1325)  bisacodyl (DULCOLAX) EC tablet 5 mg (has no administration in time range)  ondansetron (ZOFRAN) tablet 4 mg (has no administration in time range)    Or  ondansetron (ZOFRAN) injection 4 mg (has no administration in time range)  guaiFENesin (MUCINEX) 12 hr tablet 600 mg (has no administration in time range)  hydrALAZINE (APRESOLINE) injection 5 mg (has no administration in time range)  methylPREDNISolone sodium succinate (SOLU-MEDROL) 125 mg/2 mL injection 60 mg (60 mg Intravenous Given 05/03/21 1440)    Followed by  predniSONE (DELTASONE) tablet 40 mg (has no administration in time range)  ceFEPIme (MAXIPIME) 2 g in sodium chloride 0.9 % 100 mL IVPB (has no administration in time range)  heparin bolus via infusion 4,000 Units (has no administration in time range)  heparin ADULT infusion 100 units/mL (25000 units/246mL) (has no administration in time range)  albuterol (VENTOLIN HFA) 108 (90 Base) MCG/ACT inhaler 4 puff (4 puffs Inhalation Given 04/14/2021 1906)  predniSONE (DELTASONE) tablet 60 mg (60 mg Oral Given 04/23/2021 1906)  vancomycin (VANCOCIN) 2,000 mg in sodium chloride 0.9 % 500 mL IVPB (2,000 mg Intravenous New Bag/Given 05/03/21 1001)  ceFEPIme (MAXIPIME) 2 g in sodium chloride 0.9 % 100 mL IVPB (0 g Intravenous Stopped 05/03/21 0955)    ED Course  I have reviewed the triage vital signs and the nursing notes.  Pertinent labs & imaging results that were available during my  care of the patient were reviewed by me and considered in my medical decision making (see chart for details).    MDM Rules/Calculators/A&P                         CRITICAL CARE Performed by: Godfrey Pick   Total critical care time: 35 minutes  Critical care time was exclusive of separately billable procedures and treating other patients.  Critical care was necessary to treat or prevent imminent or life-threatening deterioration.  Critical care was time spent personally by me on the following activities: development of treatment plan with patient and/or surrogate as well as nursing, discussions with consultants, evaluation of patient's response to treatment, examination of patient, obtaining history from patient or surrogate, ordering and performing treatments and interventions, ordering and review of laboratory studies, ordering and review of radiographic studies, pulse oximetry and re-evaluation of patient's condition.   Patient presents to the ED in respiratory distress.  This is in the setting of worsening shortness of breath over the past several days.  On initial assessment, patient found to have central cyanosis, somnolence,  increased work of breathing and very little air movement on lung auscultation.  BiPAP was initiated.  Initial blood gas showed respiratory acidosis: 7.192/107.3/57.0/41.2.  Inline albuterol treatments (x4) were given.  Patient has been on steroids at home and to get a 60 mg dose of prednisone in the waiting room yesterday evening.  On reassessment, patient is more awake.  She is able to tolerate the BiPAP.  She is now able to engage in conversation.  When asked if she would want intubation, if her breathing were to worsen, she did state 'yes'.  Patient showed continued improvement on further reassessments.  Further work-up showed a BNP of 132.  Patient does have bilateral lower extremity edema that is greater on the left.  She states that this is baseline for her.  Chest  x-ray showed no focal opacities.  Given the severity of her condition, patient was treated empirically for pneumonia.  Repeat blood gas showed significant improvement.  Patient was kept on BiPAP to avoid worsening while in the ED.  She was admitted to hospitalist for further management.  Final Clinical Impression(s) / ED Diagnoses Final diagnoses:  Acute on chronic respiratory failure with hypercapnia [facility])    Rx / DC Orders ED Discharge Orders     None        Godfrey Pick, MD 05/03/21 1750

## 2021-05-03 NOTE — ED Notes (Signed)
Per monitor heart rate was 35 trending down to 20'S at check on pt room she look pale unresponsive with shallow breathing, nurse Larene Beach notified. HR showed asystole CPR started

## 2021-05-03 NOTE — ED Notes (Signed)
RT contacted to assess pt off Bi-Pap

## 2021-05-03 NOTE — Progress Notes (Signed)
ANTICOAGULATION CONSULT NOTE - Initial Consult  Pharmacy Consult for heparin Indication: VTE/PE rule out in setting of elevated D-dimer and SOB  Allergies  Allergen Reactions   Hydromorphone Hcl Other (See Comments)    Disorientation and delirium.    Penicillins Rash and Other (See Comments)    PATIENT HAS HAD A PCN REACTION WITH IMMEDIATE RASH, FACIAL/TONGUE/THROAT SWELLING, SOB, OR LIGHTHEADEDNESS WITH HYPOTENSION:  #  #  YES  #  #  Has patient had a PCN reaction causing severe rash involving mucus membranes or skin necrosis: No Has patient had a PCN reaction that required hospitalization: No Has patient had a PCN reaction occurring within the last 10 years: No If all of the above answers are "NO", then may proceed with Cephalosporin use.    Adhesive [Tape] Rash and Other (See Comments)    Blisters The clear tape    Amoxicillin-Pot Clavulanate Rash   Codeine Other (See Comments)    nightmares    Patient Measurements: Height: 4\' 10"  (147.3 cm) Weight: 99.8 kg (220 lb) IBW/kg (Calculated) : 40.9 Heparin Dosing Weight: 65.7kg  Vital Signs: Temp: 97.6 F (36.4 C) (09/29 0441) Temp Source: Oral (09/29 0441) BP: 108/61 (09/29 1515) Pulse Rate: 83 (09/29 1531)  Labs: Recent Labs    04/13/2021 1508 04/23/2021 1847 05/03/21 0749 05/03/21 1002  HGB 11.9* 12.0 15.3* 13.9  HCT 38.9 42.7 45.0 41.0  PLT 392.0 365  --   --   CREATININE 0.44 0.56  --   --   TROPONINIHS  --  10  --   --     Estimated Creatinine Clearance: 63.8 mL/min (by C-G formula based on SCr of 0.56 mg/dL).   Medical History: Past Medical History:  Diagnosis Date   Anemia    Asthmatic bronchitis    Blood transfusion without reported diagnosis 12/2017   Cataract    Constipation    chronic   COPD (chronic obstructive pulmonary disease) (HCC)    Diverticulosis of colon    DJD (degenerative joint disease)    GERD (gastroesophageal reflux disease)    rare   Glaucoma 1977   EYE SURGERY  FOR TX OF  GLAUCOMA--NO LONGER HAS GLAUCOMA   Hepatic cyst    s/p surgical intervention 05/2011 -[place]   Hiatal hernia    Hx of colonic polyps    Hyperlipidemia    Hypertension    Medullary sponge kidney    Nontoxic multinodular goiter    pt unsure of this/ per pt no one told her!   Obesity    Osteoporosis    Partial small bowel obstruction ([place]) 10/15/2017   Renal calculus    s/p lithotripsy 06/2011   Restless leg syndrome    Shingles 2009   Squamous cell carcinoma of wrist, right    scalp, right wrist   Venous insufficiency    Vitamin D deficiency     Assessment: 76 YOF presenting with SOB, D-dimer 3.04, she is not on anticoagulation PTA  Goal of Therapy:  Heparin level 0.3-0.7 units/ml Monitor platelets by anticoagulation protocol: Yes   Plan:  Heparin 4000 units IV x 1, and gtt at 1100 units/hr F/u 6 hour heparin level F/u chest CT angio and further VTE workup  Bertis Ruddy, PharmD Clinical Pharmacist ED Pharmacist Phone # [phone] 05/03/2021 4:17 PM

## 2021-05-03 NOTE — Progress Notes (Signed)
Patient trialed off BIPAP and on 4L [place] at this time. Patient does appear to be in some distress but not as significant as previous attempt to trial off of BIPAP. Patient informed to call if she was in distress again and felt like she needed the BIPAP. BIPAP on stand by in patients room.

## 2021-05-04 ENCOUNTER — Inpatient Hospital Stay (HOSPITAL_COMMUNITY): Payer: Medicare Other

## 2021-05-04 DIAGNOSIS — J9621 Acute and chronic respiratory failure with hypoxia: Secondary | ICD-10-CM | POA: Diagnosis not present

## 2021-05-04 DIAGNOSIS — I469 Cardiac arrest, cause unspecified: Secondary | ICD-10-CM | POA: Diagnosis not present

## 2021-05-04 DIAGNOSIS — J9622 Acute and chronic respiratory failure with hypercapnia: Secondary | ICD-10-CM | POA: Insufficient documentation

## 2021-05-04 LAB — PHOSPHORUS: Phosphorus: 3.6 mg/dL (ref 2.5–4.6)

## 2021-05-04 LAB — RESPIRATORY PANEL BY PCR

## 2021-05-04 LAB — GLUCOSE, CAPILLARY
Glucose-Capillary: 164 mg/dL — ABNORMAL HIGH (ref 70–99)
Glucose-Capillary: 179 mg/dL — ABNORMAL HIGH (ref 70–99)
Glucose-Capillary: 133 mg/dL — ABNORMAL HIGH (ref 70–99)
Glucose-Capillary: 121 mg/dL — ABNORMAL HIGH (ref 70–99)
Glucose-Capillary: 128 mg/dL — ABNORMAL HIGH (ref 70–99)
Glucose-Capillary: 169 mg/dL — ABNORMAL HIGH (ref 70–99)
Glucose-Capillary: 172 mg/dL — ABNORMAL HIGH (ref 70–99)

## 2021-05-04 LAB — I-STAT ARTERIAL BLOOD GAS, ED
Acid-Base Excess: 8 mmol/L — ABNORMAL HIGH (ref 0.0–2.0)
Bicarbonate: 36.7 mmol/L — ABNORMAL HIGH (ref 20.0–28.0)
Calcium, Ion: 1.2 mmol/L (ref 1.15–1.40)
HCT: 41 % (ref 36.0–46.0)
Hemoglobin: 13.9 g/dL (ref 12.0–15.0)
O2 Saturation: 95 %
Patient temperature: 98.4
Potassium: 4.3 mmol/L (ref 3.5–5.1)
Sodium: 144 mmol/L (ref 135–145)
TCO2: 39 mmol/L — ABNORMAL HIGH (ref 22–32)
pCO2 arterial: 72.7 mmHg (ref 32.0–48.0)
pH, Arterial: 7.311 — ABNORMAL LOW (ref 7.350–7.450)
pO2, Arterial: 84 mmHg (ref 83.0–108.0)

## 2021-05-04 LAB — BASIC METABOLIC PANEL WITH GFR
Anion gap: 13 (ref 5–15)
BUN: 39 mg/dL — ABNORMAL HIGH (ref 8–23)
CO2: 31 mmol/L (ref 22–32)
Calcium: 9 mg/dL (ref 8.9–10.3)
Chloride: 101 mmol/L (ref 98–111)
Creatinine, Ser: 0.97 mg/dL (ref 0.44–1.00)
GFR, Estimated: >60 mL/min
Glucose, Bld: 163 mg/dL — ABNORMAL HIGH (ref 70–99)
Potassium: 4.7 mmol/L (ref 3.5–5.1)
Sodium: 145 mmol/L (ref 135–145)

## 2021-05-04 LAB — CBC
HCT: 47.9 % — ABNORMAL HIGH (ref 36.0–46.0)
Hemoglobin: 13.5 g/dL (ref 12.0–15.0)
MCH: 29.3 pg (ref 26.0–34.0)
MCHC: 28.2 g/dL — ABNORMAL LOW (ref 30.0–36.0)
MCV: 104.1 fL — ABNORMAL HIGH (ref 80.0–100.0)
Platelets: 356 K/uL (ref 150–400)
RBC: 4.6 MIL/uL (ref 3.87–5.11)
RDW: 13.6 % (ref 11.5–15.5)
WBC: 20.2 K/uL — ABNORMAL HIGH (ref 4.0–10.5)
nRBC: 0 % (ref 0.0–0.2)

## 2021-05-04 LAB — HEMOGLOBIN A1C
Hgb A1c MFr Bld: 5.7 % — ABNORMAL HIGH (ref 4.8–5.6)
Mean Plasma Glucose: 116.89 mg/dL

## 2021-05-04 LAB — MAGNESIUM: Magnesium: 2.3 mg/dL (ref 1.7–2.4)

## 2021-05-04 LAB — MRSA NEXT GEN BY PCR, NASAL: MRSA by PCR Next Gen: NOT DETECTED

## 2021-05-04 MED ORDER — ASPIRIN 81 MG PO CHEW
81.0000 mg | CHEWABLE_TABLET | Freq: Every day | ORAL | Status: DC
  Administered 2021-05-04 – 2021-05-07 (×4): 81 mg
  Filled 2021-05-04 (×4): qty 1

## 2021-05-04 MED ORDER — FUROSEMIDE 10 MG/ML IJ SOLN
40.0000 mg | Freq: Once | INTRAMUSCULAR | Status: AC
  Administered 2021-05-04: 40 mg via INTRAVENOUS
  Filled 2021-05-04: qty 4

## 2021-05-04 MED ORDER — DONEPEZIL HCL 10 MG PO TABS: 10.0000 mg | ORAL_TABLET | Freq: Every day | ORAL | Status: DC

## 2021-05-04 MED ORDER — GABAPENTIN 100 MG PO CAPS: 100.0000 mg | ORAL_CAPSULE | Freq: Three times a day (TID) | ORAL | Status: DC

## 2021-05-04 MED ORDER — PROSOURCE TF PO LIQD
45.0000 mL | Freq: Every day | ORAL | Status: DC
  Administered 2021-05-05 – 2021-05-06 (×2): 45 mL
  Filled 2021-05-04 (×3): qty 45

## 2021-05-04 MED ORDER — SODIUM CHLORIDE 0.9 % IV SOLN
500.0000 mg | INTRAVENOUS | Status: AC
  Administered 2021-05-04 – 2021-05-08 (×5): 500 mg via INTRAVENOUS
  Filled 2021-05-04 (×5): qty 500

## 2021-05-04 MED ORDER — GABAPENTIN 250 MG/5ML PO SOLN
100.0000 mg | Freq: Three times a day (TID) | ORAL | Status: DC
  Filled 2021-05-04: qty 2

## 2021-05-04 MED ORDER — PROSOURCE TF PO LIQD
45.0000 mL | Freq: Two times a day (BID) | ORAL | Status: DC
  Administered 2021-05-04: 45 mL
  Filled 2021-05-04: qty 45

## 2021-05-04 MED ORDER — AMIODARONE LOAD VIA INFUSION
150.0000 mg | Freq: Once | INTRAVENOUS | Status: AC
  Administered 2021-05-04: 150 mg via INTRAVENOUS
  Filled 2021-05-04: qty 83.34

## 2021-05-04 MED ORDER — METHYLPREDNISOLONE SODIUM SUCC 125 MG IJ SOLR
125.0000 mg | Freq: Two times a day (BID) | INTRAMUSCULAR | Status: DC
  Administered 2021-05-04 (×2): 125 mg via INTRAVENOUS
  Filled 2021-05-04 (×2): qty 2

## 2021-05-04 MED ORDER — IPRATROPIUM-ALBUTEROL 0.5-2.5 (3) MG/3ML IN SOLN
RESPIRATORY_TRACT | Status: AC
  Filled 2021-05-04: qty 3

## 2021-05-04 MED ORDER — GABAPENTIN 250 MG/5ML PO SOLN
100.0000 mg | Freq: Three times a day (TID) | ORAL | Status: DC
  Administered 2021-05-04 – 2021-05-16 (×36): 100 mg
  Filled 2021-05-04 (×39): qty 2

## 2021-05-04 MED ORDER — ONDANSETRON HCL 4 MG/2ML IJ SOLN
4.0000 mg | Freq: Four times a day (QID) | INTRAMUSCULAR | Status: DC | PRN
  Administered 2021-05-16 (×2): 4 mg via INTRAVENOUS
  Filled 2021-05-04 (×3): qty 2

## 2021-05-04 MED ORDER — ACETAMINOPHEN 160 MG/5ML PO SOLN: 650.0000 mg | ORAL | Status: DC

## 2021-05-04 MED ORDER — VITAL 1.5 CAL PO LIQD
1000.0000 mL | ORAL | Status: DC
  Administered 2021-05-04 – 2021-05-15 (×10): 1000 mL
  Filled 2021-05-04 (×6): qty 1000

## 2021-05-04 MED ORDER — MIDAZOLAM HCL 2 MG/2ML IJ SOLN
1.0000 mg | INTRAMUSCULAR | Status: DC | PRN
  Administered 2021-05-04 – 2021-05-07 (×2): 1 mg via INTRAVENOUS
  Filled 2021-05-04: qty 2

## 2021-05-04 MED ORDER — PANTOPRAZOLE 2 MG/ML SUSPENSION
40.0000 mg | Freq: Every day | ORAL | Status: DC
  Administered 2021-05-04 – 2021-05-15 (×12): 40 mg
  Filled 2021-05-04 (×13): qty 20

## 2021-05-04 MED ORDER — LEVETIRACETAM IN NACL 1000 MG/100ML IV SOLN
1000.0000 mg | Freq: Once | INTRAVENOUS | Status: AC
  Administered 2021-05-04: 1000 mg via INTRAVENOUS
  Filled 2021-05-04: qty 100

## 2021-05-04 MED ORDER — POLYETHYLENE GLYCOL 3350 17 G PO PACK
17.0000 g | PACK | Freq: Every day | ORAL | Status: DC
  Administered 2021-05-04 – 2021-05-10 (×6): 17 g
  Filled 2021-05-04 (×7): qty 1

## 2021-05-04 MED ORDER — SODIUM CHLORIDE 0.9 % IV SOLN
1.0000 g | INTRAVENOUS | Status: AC
  Administered 2021-05-04 – 2021-05-07 (×4): 1 g via INTRAVENOUS
  Filled 2021-05-04 (×4): qty 10

## 2021-05-04 MED ORDER — FENTANYL CITRATE (PF) 100 MCG/2ML IJ SOLN
25.0000 ug | INTRAMUSCULAR | Status: DC | PRN
  Administered 2021-05-04 (×2): 100 ug via INTRAVENOUS
  Administered 2021-05-04 – 2021-05-06 (×4): 50 ug via INTRAVENOUS
  Administered 2021-05-06: 100 ug via INTRAVENOUS
  Administered 2021-05-06 – 2021-05-11 (×4): 50 ug via INTRAVENOUS
  Administered 2021-05-12: 100 ug via INTRAVENOUS
  Administered 2021-05-12: 50 ug via INTRAVENOUS
  Administered 2021-05-12: 100 ug via INTRAVENOUS
  Administered 2021-05-12 – 2021-05-13 (×2): 50 ug via INTRAVENOUS
  Administered 2021-05-13: 100 ug via INTRAVENOUS
  Administered 2021-05-13: 50 ug via INTRAVENOUS
  Administered 2021-05-14 – 2021-05-16 (×4): 25 ug via INTRAVENOUS
  Administered 2021-05-16: 50 ug via INTRAVENOUS
  Administered 2021-05-16: 25 ug via INTRAVENOUS
  Filled 2021-05-04 (×8): qty 2

## 2021-05-04 MED ORDER — BUDESONIDE 0.25 MG/2ML IN SUSP
0.2500 mg | Freq: Two times a day (BID) | RESPIRATORY_TRACT | Status: DC
  Administered 2021-05-04: 0.25 mg via RESPIRATORY_TRACT
  Filled 2021-05-04: qty 2

## 2021-05-04 MED ORDER — SODIUM CHLORIDE 0.9 % IV SOLN
250.0000 mL | INTRAVENOUS | Status: DC
  Administered 2021-05-08 – 2021-05-16 (×3): 250 mL via INTRAVENOUS

## 2021-05-04 MED ORDER — LEVETIRACETAM IN NACL 500 MG/100ML IV SOLN
500.0000 mg | Freq: Two times a day (BID) | INTRAVENOUS | Status: DC
  Administered 2021-05-04 – 2021-05-05 (×2): 500 mg via INTRAVENOUS
  Filled 2021-05-04 (×2): qty 100

## 2021-05-04 MED ORDER — ACETAMINOPHEN 325 MG PO TABS: 650.0000 mg | ORAL_TABLET | ORAL | Status: DC

## 2021-05-04 MED ORDER — PANTOPRAZOLE SODIUM 40 MG IV SOLR
40.0000 mg | Freq: Every day | INTRAVENOUS | Status: DC
  Administered 2021-05-04: 40 mg via INTRAVENOUS
  Filled 2021-05-04: qty 40

## 2021-05-04 MED ORDER — CHLORHEXIDINE GLUCONATE 0.12% ORAL RINSE (MEDLINE KIT)
15.0000 mL | Freq: Two times a day (BID) | OROMUCOSAL | Status: DC
  Administered 2021-05-04 – 2021-05-17 (×27): 15 mL via OROMUCOSAL

## 2021-05-04 MED ORDER — AMIODARONE HCL IN DEXTROSE 360-4.14 MG/200ML-% IV SOLN
60.0000 mg/h | INTRAVENOUS | Status: AC
  Administered 2021-05-04: 60 mg/h via INTRAVENOUS
  Filled 2021-05-04: qty 200

## 2021-05-04 MED ORDER — BUDESONIDE 0.5 MG/2ML IN SUSP
0.5000 mg | Freq: Two times a day (BID) | RESPIRATORY_TRACT | Status: DC
  Administered 2021-05-04 – 2021-05-16 (×24): 0.5 mg via RESPIRATORY_TRACT
  Filled 2021-05-04 (×24): qty 2

## 2021-05-04 MED ORDER — PHENYLEPHRINE HCL-NACL 20-0.9 MG/250ML-% IV SOLN
25.0000 ug/min | INTRAVENOUS | Status: DC
  Administered 2021-05-04: 200 ug/min via INTRAVENOUS
  Administered 2021-05-04: 25 ug/min via INTRAVENOUS
  Filled 2021-05-04 (×2): qty 250
  Filled 2021-05-04: qty 500

## 2021-05-04 MED ORDER — NOREPINEPHRINE 4 MG/250ML-% IV SOLN
0.0000 ug/min | INTRAVENOUS | Status: DC
  Administered 2021-05-04: 2 ug/min via INTRAVENOUS

## 2021-05-04 MED ORDER — VITAL HIGH PROTEIN PO LIQD: 1000.0000 mL | ORAL | Status: DC

## 2021-05-04 MED ORDER — MONTELUKAST SODIUM 10 MG PO TABS
10.0000 mg | ORAL_TABLET | Freq: Every evening | ORAL | Status: DC
  Administered 2021-05-04 – 2021-05-15 (×12): 10 mg
  Filled 2021-05-04 (×12): qty 1

## 2021-05-04 MED ORDER — FENTANYL CITRATE (PF) 100 MCG/2ML IJ SOLN: 25.0000 ug | INTRAMUSCULAR | Status: DC | PRN

## 2021-05-04 MED ORDER — PREDNISONE 20 MG PO TABS: 40.0000 mg | ORAL_TABLET | Freq: Every day | ORAL | Status: DC

## 2021-05-04 MED ORDER — FENTANYL CITRATE PF 50 MCG/ML IJ SOSY: 25.0000 ug | PREFILLED_SYRINGE | INTRAMUSCULAR | Status: DC | PRN

## 2021-05-04 MED ORDER — NOREPINEPHRINE 4 MG/250ML-% IV SOLN
2.0000 ug/min | INTRAVENOUS | Status: DC
  Administered 2021-05-04: 2 ug/min via INTRAVENOUS
  Filled 2021-05-04: qty 250

## 2021-05-04 MED ORDER — ROPINIROLE HCL 1 MG PO TABS: 1.0000 mg | ORAL_TABLET | Freq: Three times a day (TID) | ORAL | Status: DC

## 2021-05-04 MED ORDER — ACETAMINOPHEN 650 MG RE SUPP: 650.0000 mg | Freq: Four times a day (QID) | RECTAL | Status: DC | PRN

## 2021-05-04 MED ORDER — ROPINIROLE HCL 1 MG PO TABS
1.0000 mg | ORAL_TABLET | Freq: Three times a day (TID) | ORAL | Status: DC
  Administered 2021-05-04 – 2021-05-08 (×15): 1 mg
  Filled 2021-05-04 (×16): qty 1

## 2021-05-04 MED ORDER — HEPARIN SODIUM (PORCINE) 5000 UNIT/ML IJ SOLN
5000.0000 [IU] | Freq: Three times a day (TID) | INTRAMUSCULAR | Status: DC
  Administered 2021-05-04 – 2021-05-07 (×11): 5000 [IU] via SUBCUTANEOUS
  Filled 2021-05-04 (×11): qty 1

## 2021-05-04 MED ORDER — AMIODARONE HCL IN DEXTROSE 360-4.14 MG/200ML-% IV SOLN
INTRAVENOUS | Status: AC
  Administered 2021-05-04: 60 mg/h via INTRAVENOUS
  Filled 2021-05-04: qty 200

## 2021-05-04 MED ORDER — PRAVASTATIN SODIUM 40 MG PO TABS
40.0000 mg | ORAL_TABLET | Freq: Every day | ORAL | Status: DC
  Administered 2021-05-04 – 2021-05-15 (×12): 40 mg
  Filled 2021-05-04 (×12): qty 1

## 2021-05-04 MED ORDER — ACETAMINOPHEN 650 MG RE SUPP: 650.0000 mg | RECTAL | Status: DC

## 2021-05-04 MED ORDER — INSULIN ASPART 100 UNIT/ML IJ SOLN
0.0000 [IU] | INTRAMUSCULAR | Status: DC
  Administered 2021-05-04: 3 [IU] via SUBCUTANEOUS
  Administered 2021-05-04: 4 [IU] via SUBCUTANEOUS
  Administered 2021-05-04: 3 [IU] via SUBCUTANEOUS
  Administered 2021-05-04: 4 [IU] via SUBCUTANEOUS
  Administered 2021-05-05: 3 [IU] via SUBCUTANEOUS
  Administered 2021-05-05 (×2): 4 [IU] via SUBCUTANEOUS
  Administered 2021-05-05: 7 [IU] via SUBCUTANEOUS
  Administered 2021-05-05: 4 [IU] via SUBCUTANEOUS
  Administered 2021-05-06: 3 [IU] via SUBCUTANEOUS
  Administered 2021-05-06 (×5): 4 [IU] via SUBCUTANEOUS
  Administered 2021-05-07 (×4): 3 [IU] via SUBCUTANEOUS
  Administered 2021-05-08: 4 [IU] via SUBCUTANEOUS
  Administered 2021-05-08: 3 [IU] via SUBCUTANEOUS
  Administered 2021-05-09: 4 [IU] via SUBCUTANEOUS
  Administered 2021-05-09: 3 [IU] via SUBCUTANEOUS
  Administered 2021-05-09 (×2): 11 [IU] via SUBCUTANEOUS
  Administered 2021-05-09: 4 [IU] via SUBCUTANEOUS
  Administered 2021-05-10: 7 [IU] via SUBCUTANEOUS
  Administered 2021-05-10 (×4): 4 [IU] via SUBCUTANEOUS
  Administered 2021-05-10 (×2): 7 [IU] via SUBCUTANEOUS
  Administered 2021-05-11: 3 [IU] via SUBCUTANEOUS
  Administered 2021-05-11: 4 [IU] via SUBCUTANEOUS
  Administered 2021-05-11 – 2021-05-12 (×3): 3 [IU] via SUBCUTANEOUS
  Administered 2021-05-12 (×3): 4 [IU] via SUBCUTANEOUS
  Administered 2021-05-12 – 2021-05-13 (×2): 3 [IU] via SUBCUTANEOUS
  Administered 2021-05-13: 4 [IU] via SUBCUTANEOUS
  Administered 2021-05-13: 7 [IU] via SUBCUTANEOUS
  Administered 2021-05-13: 3 [IU] via SUBCUTANEOUS
  Administered 2021-05-13 – 2021-05-14 (×2): 4 [IU] via SUBCUTANEOUS
  Administered 2021-05-14: 3 [IU] via SUBCUTANEOUS
  Administered 2021-05-14 (×2): 4 [IU] via SUBCUTANEOUS
  Administered 2021-05-15: 6 [IU] via SUBCUTANEOUS
  Administered 2021-05-15: 3 [IU] via SUBCUTANEOUS

## 2021-05-04 MED ORDER — AMIODARONE IV BOLUS ONLY 150 MG/100ML: 150.0000 mg | Freq: Once | INTRAVENOUS | Status: DC

## 2021-05-04 MED ORDER — CLONAZEPAM 1 MG PO TABS: 1.0000 mg | ORAL_TABLET | Freq: Every day | ORAL | Status: DC

## 2021-05-04 MED ORDER — AMIODARONE HCL IN DEXTROSE 360-4.14 MG/200ML-% IV SOLN: 30.0000 mg/h | INTRAVENOUS | Status: DC

## 2021-05-04 MED ORDER — IPRATROPIUM-ALBUTEROL 0.5-2.5 (3) MG/3ML IN SOLN
3.0000 mL | RESPIRATORY_TRACT | Status: DC
  Administered 2021-05-04 – 2021-05-05 (×9): 3 mL via RESPIRATORY_TRACT
  Filled 2021-05-04 (×8): qty 3

## 2021-05-04 MED ORDER — AMIODARONE HCL IN DEXTROSE 360-4.14 MG/200ML-% IV SOLN
30.0000 mg/h | INTRAVENOUS | Status: DC
  Administered 2021-05-04 – 2021-05-07 (×6): 30 mg/h via INTRAVENOUS
  Filled 2021-05-04 (×6): qty 200

## 2021-05-04 MED ORDER — AMIODARONE HCL IN DEXTROSE 360-4.14 MG/200ML-% IV SOLN
60.0000 mg/h | INTRAVENOUS | Status: DC
  Administered 2021-05-04 (×2): 60 mg/h via INTRAVENOUS
  Filled 2021-05-04 (×3): qty 200

## 2021-05-04 MED ORDER — INSULIN ASPART 100 UNIT/ML IJ SOLN
0.0000 [IU] | INTRAMUSCULAR | Status: DC
Start: 2021-05-04 — End: 2021-05-04
  Administered 2021-05-04: 1 [IU] via SUBCUTANEOUS
  Administered 2021-05-04 (×2): 2 [IU] via SUBCUTANEOUS

## 2021-05-04 MED ORDER — SODIUM CHLORIDE 0.9 % IV SOLN
250.0000 mL | INTRAVENOUS | Status: DC
  Administered 2021-05-04 (×2): 250 mL via INTRAVENOUS

## 2021-05-04 MED ORDER — SODIUM CHLORIDE 0.9 % IV BOLUS
1000.0000 mL | Freq: Once | INTRAVENOUS | Status: AC
  Administered 2021-05-04: 1000 mL via INTRAVENOUS

## 2021-05-04 MED ORDER — ACETAMINOPHEN 325 MG PO TABS
650.0000 mg | ORAL_TABLET | Freq: Four times a day (QID) | ORAL | Status: DC | PRN
  Filled 2021-05-04: qty 2

## 2021-05-04 MED ORDER — CLONAZEPAM 1 MG PO TABS
1.0000 mg | ORAL_TABLET | Freq: Every day | ORAL | Status: DC
  Administered 2021-05-04 – 2021-05-15 (×12): 1 mg
  Filled 2021-05-04 (×11): qty 1

## 2021-05-04 MED ORDER — DOCUSATE SODIUM 50 MG/5ML PO LIQD
100.0000 mg | Freq: Two times a day (BID) | ORAL | Status: DC
  Administered 2021-05-04 – 2021-05-06 (×5): 100 mg
  Filled 2021-05-04 (×5): qty 10

## 2021-05-04 MED ORDER — ONDANSETRON HCL 4 MG PO TABS: 4.0000 mg | ORAL_TABLET | Freq: Four times a day (QID) | ORAL | Status: DC | PRN

## 2021-05-04 MED ORDER — ORAL CARE MOUTH RINSE
15.0000 mL | OROMUCOSAL | Status: DC
  Administered 2021-05-04 – 2021-05-17 (×129): 15 mL via OROMUCOSAL

## 2021-05-04 MED ORDER — CHLORHEXIDINE GLUCONATE CLOTH 2 % EX PADS
6.0000 | MEDICATED_PAD | Freq: Every day | CUTANEOUS | Status: DC
  Administered 2021-05-04 – 2021-05-15 (×13): 6 via TOPICAL

## 2021-05-04 NOTE — Progress Notes (Signed)
Pt transported on vent from 3M08 to CT and back without any complications, RN at bedside, RT will continue to monitor.

## 2021-05-04 NOTE — Progress Notes (Signed)
[facility] Progress Note Patient Name: Lisa Hamilton DOB: 12-Nov-1945 MRN: [id number]   Date of Service  05/04/2021  HPI/Events of Note  Nursing request for CBC in AM.  eICU Interventions  Plan: CBC with platelets in AM.     Intervention Category Major Interventions: Other:  Lysle Dingwall 05/04/2021, 11:14 PM

## 2021-05-04 NOTE — TOC Initial Note (Signed)
Transition of Care [facility]) - Initial/Assessment Note    Patient Details  Name: Lisa Hamilton MRN: [id number] Date of Birth: [birth year]  Transition of Care [facility]) CM/SW Contact:    Tom-Johnson, Renea Ee, RN Phone Number: 05/04/2021, 3:55 PM  Clinical Narrative:                 CM spoke with patient's sons, Ronalee Belts and Thayer at bedside. They are her only two children. Patient is intubated and cannot be assessed at this time. Both sons answered intermittently. States patient lives with husband who has short term memory loss. States patient was able to ambulate self with a walker or cane. Does own ADL's. Able to drive self prior to hospitalization and husband drives sometimes. Has home Oxygen and gets supplies from [employer]. Sons live in [place] and wants parents to move closer to them. Requesting if patient is to be discharged to a rehab facility, they prefer [facility] in [place] as it's close to patient's house for her husband to visit and patient's sister could visit. CM told them that  PT/OT has to evaluate patient for disposition after she is weaned off intubation and can tolerate therapy. Both voiced understanding. Patient is not medically ready for discharge. CM will continue to follow with needs.    Barriers to Discharge: Continued Medical Work up   Patient Goals and CMS Choice Patient states their goals for this hospitalization and ongoing recovery are:: To get better and go home to husband      Expected Discharge Plan and Services     Discharge Planning Services: CM Consult   Living arrangements for the past 2 months: Single Family Home                                      Prior Living Arrangements/Services Living arrangements for the past 2 months: Single Family Home Lives with:: Spouse Patient language and need for interpreter reviewed:: Yes Do you feel safe going back to the place where you live?: Yes      Need for Family Participation in Patient  Care: Yes (Comment) Care giver support system in place?: Yes (comment) Current home services: DME Angela Burke) Criminal Activity/Legal Involvement Pertinent to Current Situation/Hospitalization: No - Comment as needed  Activities of Daily Living      Permission Sought/Granted Permission sought to share information with : Case Manager, Family Supports Permission granted to share information with : Yes, Verbal Permission Granted              Emotional Assessment Appearance:: Appears stated age Attitude/Demeanor/Rapport: Sedated Affect (typically observed): Unable to Assess Orientation: : Oriented to Self Alcohol / Substance Use: Not Applicable Psych Involvement: No (comment)  Admission diagnosis:  Cardiac arrest [facility]) [I46.9] Acute on chronic respiratory failure with hypercapnia ([place]) [J96.22] Acute on chronic respiratory failure with hypoxia ([place]) [J96.21] Nasogastric tube present [Z97.8] Patient Active Problem List   Diagnosis Date Noted   Cardiac arrest ([place]) 05/04/2021   Acute on chronic respiratory failure with hypercapnia ([place])    Acute on chronic respiratory failure with hypoxia ([place]) 05/03/2021   Mild cognitive disorder 07/24/2020   Peripheral edema 02/12/2020   History of colonic polyps    Chronic respiratory failure with hypoxia ([place]) 03/04/2018   Nephrolithiasis 10/15/2017   Anemia 12/22/2015   Hyperglycemia 01/04/2014   NONTOXIC MULTINODULAR GOITER 08/06/2008   HYPERCHOLESTEROLEMIA 08/06/2008   RESTLESS LEG SYNDROME 08/06/2008  Essential hypertension 08/06/2008   Diverticulosis of colon without hemorrhage 08/06/2008   Liver cyst s/p partial hepatectomy 12/04/2017 08/06/2008   MEDULLARY SPONGE KIDNEY 08/06/2008   VITAMIN D DEFICIENCY 10/21/2007   Morbid obesity ([place]) 10/21/2007   GLAUCOMA 10/21/2007   Asthma in adult, mild persistent, uncomplicated 83/75/4237   Osteoarthritis 10/21/2007   Osteoporosis 10/21/2007   PCP:  Hoyt Koch,  MD Pharmacy:   [facility] [address], [place] [address] [place] [address] Phone: [phone] Fax: [phone]     Social Determinants of Health (SDOH) Interventions    Readmission Risk Interventions No flowsheet data found.

## 2021-05-04 NOTE — Progress Notes (Signed)
Notified Elink MD about Chest X-Ray results with possible need for repositioning of ETT tube. Elink MD was notified and paged CCM physician that was present during intubation. Per Elink MD after review, ETT tube in correct placement.

## 2021-05-04 NOTE — Progress Notes (Signed)
Recurrent a fib with rvr and low bp.  Will resume amiodarone and give fluid bolus,  Chesley Mires, MD [place] Pager - [phone] 05/04/2021, 4:32 PM

## 2021-05-04 NOTE — Progress Notes (Signed)
EEG was attempted however nurse was having a difficult time getting IV. Nurse reached out to doctor to try and get a line. Requested EEG to wait until a line was placed if possible.

## 2021-05-04 NOTE — ED Notes (Signed)
X-ray at bedside

## 2021-05-04 NOTE — Progress Notes (Signed)
Pt head ing to CT will attempt EEG at a later time when schedule permits

## 2021-05-04 NOTE — Progress Notes (Signed)
[place] Progress Note Patient Name: Lisa Hamilton DOB: 30-Jul-1946 MRN: [id number]   Date of Service  05/04/2021  HPI/Events of Note  Oliguria - CVP = 16 and Creatinine = 0.97.  eICU Interventions  Plan: Lasix 40 mg IV X 1 now.      Intervention Category Major Interventions: Other:  Lysle Dingwall 05/04/2021, 10:57 PM

## 2021-05-04 NOTE — Progress Notes (Addendum)
Patient had a seizure with tonic-clonic jerking movements in all four extremities and upward gaze lasting 1 & 1/2 minutes. PRN versed given. Prior to seizure, patient's RASS -1 responding to voice and following commands. Post seizure patient was unresponsive and became hypotensive with SBP's in the 60's, and Afib RVR with rates in the 170's-190's. Elink MD notified. New orders for Keppra & Amiodarone given. Will continue to monitor patient closely.

## 2021-05-04 NOTE — Progress Notes (Signed)
[place] Progress Note Patient Name: Lisa Hamilton DOB: [birth year] MRN: [id number]   Date of Service  05/04/2021  HPI/Events of Note  Multiple issues: 1. Seizure and 2. AFIB with RVR - Ventricular rate = 150-170. K+ = 4.7 and Mg++ = 2.3.  eICU Interventions  Plan: Keppra 1 gm IV now, then 500 mg IV Q 12 hours. Versed 1 mg IV now. Amiodarone IV load and infusion.     Intervention Category Major Interventions: Seizures - evaluation and management  Mora Pedraza Cornelia Copa 05/04/2021, 3:57 AM

## 2021-05-04 NOTE — Progress Notes (Signed)
NAME:  KAMILAH CORREIA, MRN:  [id number], DOB:  Aug 02, 1946, LOS: 1 ADMISSION DATE:  04/27/2021, CONSULTATION DATE:  9/29 REFERRING MD:  Dr. Lorin Mercy, CHIEF COMPLAINT:  Cardiac arrest   History of Present Illness:  75 yr old female followed by Dr. Keturah Barre for COPD from asthma and chronic respiratory failure on 2 to 3 liters oxygen developed progressive cough, dyspnea, wheezing, and weakness.  Her husband reports she received COVID vaccine booster 2 days prior to developing symptoms.  She presented to ER on 9/28 and started on Bipap for acute on chronic hypoxic/hypercapnic respiratory failure.  She was started on steroids and antibiotics.  Had initial improvement and transitioned off Bipap per patient request.  Several hours later she then developed bradycardia leading to asystolic cardiac arrest.  Had ROSC after about 15 minutes.  Intubated and transferred to ICU.  Pertinent  Medical History  Anemia, Diverticulosis, DJD, GERD, Glaucoma, Hepatic cyst, Hiatal hernia, Colon polyps, HLD, HTN, Medullary sponge kidney, Goiter, Osteoporosis, SBO, Nephrolithiasis, RLS, Shingles, Vit D deficiency  Significant Hospital Events: Including procedures, antibiotic start and stop dates in addition to other pertinent events   9/28 Admit 9/29 respiratory leading to cardiac arrest, intubated and transferred to ICU 9/30 tonic-clonic seizure and started on keppra, developed transient A fib and started on amiodarone, started on peripheral phenylephrine,   Interim History / Subjective:  Remains on pressors, amiodarone, increased PEEP/FiO2.  In sinus rhythm.  Objective   Blood pressure 98/68, pulse 86, temperature 99.5 F (37.5 C), resp. rate 20, height 5' (1.524 m), weight 101.8 kg, SpO2 95 %.    Vent Mode: PRVC FiO2 (%):  [40 %-100 %] 100 % Set Rate:  [18 bmp-20 bmp] 20 bmp Vt Set:  [360 mL-370 mL] 370 mL PEEP:  [5 cmH20-10 cmH20] 10 cmH20 Plateau Pressure:  [18 cmH20-29 cmH20] 29 cmH20   Intake/Output  Summary (Last 24 hours) at 05/04/2021 3716 Last data filed at 05/04/2021 0800 Gross per 24 hour  Intake 973.8 ml  Output 40 ml  Net 933.8 ml   Filed Weights   04/13/2021 1846 05/04/21 0545  Weight: 99.8 kg 101.8 kg    Examination:  General - somnolent Eyes - pupils reactive ENT - ETT in place Cardiac - regular rate/rhythm, no murmur Chest - b/l expiratory wheezing Abdomen - soft, non tender, + bowel sounds Extremities - 1+ edema Skin - no rashes Neuro - wakes up easily with stimulation, follows commands appropriately, moves all extremities  Resolved Hospital Problem list     Assessment & Plan:   Acute on chronic hypoxic/hypercapnic respiratory failure in setting of asthma/COPD exacerbation. - full vent support - adjust PEEP/FiO2 to keep SpO2 > 92% - f/u CXR - solumedrol 60 mg q6h - continue pulmicort, duoneb, singulair  Respiratory leading to cardiac arrest. Cardiogenic shock. Transient A fib on 05/04/21 >> back in sinus rhythm. Hx of HTN, HLD. - f/u Echo - complete current bag of amiodarone and then d/c - pressors to keep MAP > 65 - continue ASA, pravachol - hold cozaar, lasix  Acute metabolic encephalopathy from hypoxia, hypercapnia. Isolated seizure after cardiac arrest on 9/30. Hx of RLS. - RASS goal 0 to -1 - f/u EEG, CT head - continue keppra for now - continue klonopin, neurontin, requip  Steroid induced hyperglycemia. - SSI  Hx of osteoporosis. - hold outpt fosamax  Best Practice (right click and "Reselect all SmartList Selections" daily)   Diet/type: tubefeeds DVT prophylaxis: prophylactic heparin  GI prophylaxis: PPI Lines: N/A  Foley:  Yes, and it is still needed Code Status:  full code Family: Updated pt's husband at bedside on 05/04/21.  Labs    CMP Latest Ref Rng & Units 05/04/2021 05/04/2021 05/03/2021  Glucose 70 - 99 mg/dL 163(H) - -  BUN 8 - 23 mg/dL 39(H) - -  Creatinine 0.44 - 1.00 mg/dL 0.97 - -  Sodium 135 - 145 mmol/L 145 144  141  Potassium 3.5 - 5.1 mmol/L 4.7 4.3 5.1  Chloride 98 - 111 mmol/L 101 - -  CO2 22 - 32 mmol/L 31 - -  Calcium 8.9 - 10.3 mg/dL 9.0 - -  Total Protein 6.5 - 8.1 g/dL - - -  Total Bilirubin 0.3 - 1.2 mg/dL - - -  Alkaline Phos 38 - 126 U/L - - -  AST 15 - 41 U/L - - -  ALT 0 - 44 U/L - - -    CBC Latest Ref Rng & Units 05/04/2021 05/04/2021 05/03/2021  WBC 4.0 - 10.5 K/uL 20.2(H) - -  Hemoglobin 12.0 - 15.0 g/dL 13.5 13.9 13.9  Hematocrit 36.0 - 46.0 % 47.9(H) 41.0 41.0  Platelets 150 - 400 K/uL 356 - -    ABG    Component Value Date/Time   PHART 7.311 (L) 05/04/2021 0002   PCO2ART 72.7 (HH) 05/04/2021 0002   PO2ART 84 05/04/2021 0002   HCO3 36.7 (H) 05/04/2021 0002   TCO2 39 (H) 05/04/2021 0002   O2SAT 95.0 05/04/2021 0002    CBG (last 3)  Recent Labs    05/04/21 0223 05/04/21 0443 05/04/21 0733  GLUCAP 164* 179* 133*     Critical care time: 52 minutes  Chesley Mires, MD [place] Pager - [phone] 05/04/2021, 9:22 AM

## 2021-05-04 NOTE — ED Notes (Signed)
Notified RT pt ventilator states PAW high. RT en route

## 2021-05-04 NOTE — Progress Notes (Signed)
Patient transferred from ED to 3M07 without incidence.

## 2021-05-04 NOTE — ED Notes (Signed)
Pt requested to remove Bi-Pap to take medication for restless syndrome. Notified RT to bedside.

## 2021-05-04 NOTE — Progress Notes (Signed)
Initial Nutrition Assessment  DOCUMENTATION CODES:   Not applicable  INTERVENTION:   Tube feeding via OG tube: - Vital 1.5 @ 50 ml/hr (1200 ml/day) - ProSource TF 45 ml daily  Tube feeding regimen provides 1840 kcal, 92 grams of protein, and 917 ml of H2O.   NUTRITION DIAGNOSIS:   Inadequate oral intake related to inability to eat as evidenced by NPO status.  GOAL:   Patient will meet greater than or equal to 90% of their needs  MONITOR:   Vent status, Labs, Weight trends, TF tolerance, I & O's  REASON FOR ASSESSMENT:   Ventilator, Consult Enteral/tube feeding initiation and management  ASSESSMENT:   75 year old female who presented to the ED on 9/28 with SOB. PMH of COPD, asthma, osteoporosis, HTN, HLD, GERD, anemia, diverticulosis, hiatal hernia, vitamin D deficiency. Pt admitted with acute on chronic respiratory failure in setting of asthma/COPD exacerbation.  9/29 - cardiac arrest, intubated, NG tube placed 9/30 - seizure  Discussed pt with RN and during ICU rounds. Consult received for tube feeding initiation and management. Pt with OG tube and side port in the stomach.  Spoke with pt's son at bedside. Pt's son does not know much about pt's recent PO intake but states that pt has not mentioned any issues with poor appetite or poor PO intake to him. He reports that pt's weight has been stable recently but pt has been trying to lose weight to help with mobility.  Reviewed weight history in chart. Weight has fluctuated between 98-105 kg over the last year. No real trends noted. Current weight of 101.8 kg is up from weight of 98.7 kg on 01/04/21. However, pt with mild to moderate pitting edema.  Patient is currently intubated on ventilator support MV: 8.1 L/min Temp (24hrs), Avg:98.9 F (37.2 C), Min:98.2 F (36.8 C), Max:99.5 F (37.5 C)  Drips: NS: 10 ml/hr  Medications reviewed and include: colace, SSI q 4 hours, IV solu-medrol, protonix, miralax, IV abx,  keppra  Labs reviewed: BUN 39 CBG's: 113-179 x 24 hours  UOP: 30 ml x 12 hours I/O's: +933 ml since admit  NUTRITION - FOCUSED PHYSICAL EXAM:  Flowsheet Row Most Recent Value  Orbital Region No depletion  Upper Arm Region No depletion  Thoracic and Lumbar Region No depletion  Buccal Region Unable to assess  Temple Region No depletion  Clavicle Bone Region Mild depletion  Clavicle and Acromion Bone Region Mild depletion  Scapular Bone Region No depletion  Dorsal Hand No depletion  Patellar Region No depletion  Anterior Thigh Region No depletion  Posterior Calf Region No depletion  Edema (RD Assessment) Moderate  [BUE, BLE]  Hair Reviewed  Eyes Reviewed  Mouth Unable to assess  Skin Reviewed  Nails Reviewed       Diet Order:   Diet Order             Diet NPO time specified  Diet effective now                   EDUCATION NEEDS:   No education needs have been identified at this time  Skin:  Skin Assessment: Reviewed RN Assessment  Last BM:  no documented BM  Height:   Ht Readings from Last 1 Encounters:  05/03/21 5' (1.524 m)    Weight:   Wt Readings from Last 1 Encounters:  05/04/21 101.8 kg    Ideal Body Weight:  45.5 kg  BMI:  Body mass index is 43.83 kg/m.  Estimated Nutritional  Needs:   Kcal:  1650-1850  Protein:  90-110 grams  Fluid:  1.6-1.8 L    [name], MS, RD, LDN Inpatient Clinical Dietitian Please see AMiON for contact information.

## 2021-05-04 NOTE — Progress Notes (Signed)
Gave patient's belongings to patient's husband, Glynis Hunsucker, including patient's ring.

## 2021-05-04 NOTE — Progress Notes (Signed)
[place] Progress Note Patient Name: Lisa Hamilton DOB: Jul 26, 1946 MRN: [id number]   Date of Service  05/04/2021  HPI/Events of Note  Oliguria   eICU Interventions  Plan: Monitor CVP now and Q 4 hours.     Intervention Category Major Interventions: Other:  Lysle Dingwall 05/04/2021, 9:15 PM

## 2021-05-04 NOTE — Progress Notes (Signed)
eLink Physician-Brief Progress Note Patient Name: Lisa Hamilton DOB: 27-May-1946 MRN: [id number]   Date of Service  05/04/2021  HPI/Events of Note  Patient arrives in ICU with Foley catheter. No order for Foley catheter.   eICU Interventions  Plan: Continue Foley catheter.      Intervention Category Major Interventions: Other:  Marcellene Shivley Cornelia Copa 05/04/2021, 6:11 AM

## 2021-05-04 NOTE — Procedures (Signed)
Central Venous Catheter Insertion Procedure Note  Lisa Hamilton  [id number]  08/17/1945  Date:05/04/21  Time:6:23 PM   Provider Performing:Ester Mabe D Rollene Rotunda   Procedure: Insertion of Non-tunneled Central Venous [phone]) with US guidance ([id number])   Indication(s) Medication administration  Consent Risks of the procedure as well as the alternatives and risks of each were explained to the patient and/or caregiver.  Consent for the procedure was obtained and is signed in the bedside chart  Anesthesia Topical only with 1% lidocaine   Timeout Verified patient identification, verified procedure, site/side was marked, verified correct patient position, special equipment/implants available, medications/allergies/relevant history reviewed, required imaging and test results available.  Sterile Technique Maximal sterile technique including full sterile barrier drape, hand hygiene, sterile gown, sterile gloves, mask, hair covering, sterile ultrasound probe cover (if used).  Procedure Description Area of catheter insertion was cleaned with chlorhexidine and draped in sterile fashion.  With real-time ultrasound guidance a central venous catheter was placed into the left internal jugular vein. Nonpulsatile blood flow and easy flushing noted in all ports.  The catheter was sutured in place and sterile dressing applied.     Complications/Tolerance None; patient tolerated the procedure well. Chest X-ray is ordered to verify placement for internal jugular or subclavian cannulation.   Chest x-ray is not ordered for femoral cannulation.  EBL Minimal  Specimen(s) None  JD Rexene Agent [facility] 05/04/2021, 6:24 PM  Please see [email or web address] for pager details.  From 7A-7P if no response, please call [phone]. After hours, please call ELink [phone].

## 2021-05-04 NOTE — Progress Notes (Signed)
[place] Progress Note Patient Name: Lisa Hamilton DOB: 1945-12-10 MRN: [id number]   Date of Service  05/04/2021  HPI/Events of Note  AFIB with RVR - Ventricular rate remains in the 140's to 150's.   eICU Interventions  Plan: Will rebolus with Amiodarone 150 mg IV over 10 minutes now.     Intervention Category Major Interventions: Arrhythmia - evaluation and management  Maddalyn Lutze [place] 05/04/2021, 4:59 AM

## 2021-05-04 NOTE — Progress Notes (Signed)
Echo attempted. Patient going to CT per nurse. Will attempt again later as schedule permits.

## 2021-05-04 NOTE — Progress Notes (Signed)
[place] Progress Note Patient Name: Lisa Hamilton DOB: November 19, 1945 MRN: [id number]   Date of Service  05/04/2021  HPI/Events of Note  Multiple issues" 1. Hypotension - SBP = 87. 2. Episode of elevated HR to 150's. Now Sinus Tachycardia with Rate = 104.  eICU Interventions  Plan: Phenylephrine IV infusion. Titrate to MAP >= 65. Await K+ and Mg+     Intervention Category Major Interventions: Hypotension - evaluation and management  Scott Fix [place] 05/04/2021, 3:22 AM

## 2021-05-04 NOTE — Progress Notes (Signed)
Echo attempted again. EEG with patient. Will attempt again later.

## 2021-05-04 NOTE — ED Notes (Signed)
Pt stated before going to CT she will need to be placed on Bi-Pap because she is unable to lay flat. Pt is currently at a 90 degree angle. Pt do not want bed adjusted.

## 2021-05-05 ENCOUNTER — Inpatient Hospital Stay (HOSPITAL_COMMUNITY): Payer: Medicare Other

## 2021-05-05 DIAGNOSIS — R569 Unspecified convulsions: Secondary | ICD-10-CM | POA: Diagnosis not present

## 2021-05-05 DIAGNOSIS — I469 Cardiac arrest, cause unspecified: Secondary | ICD-10-CM

## 2021-05-05 DIAGNOSIS — J9621 Acute and chronic respiratory failure with hypoxia: Secondary | ICD-10-CM | POA: Diagnosis not present

## 2021-05-05 LAB — CBC WITH DIFFERENTIAL/PLATELET
Abs Immature Granulocytes: 0.05 10*3/uL (ref 0.00–0.07)
Basophils Absolute: 0 10*3/uL (ref 0.0–0.1)
Basophils Relative: 0 %
Eosinophils Absolute: 0 10*3/uL (ref 0.0–0.5)
Eosinophils Relative: 0 %
HCT: 38.8 % (ref 36.0–46.0)
Hemoglobin: 11.4 g/dL — ABNORMAL LOW (ref 12.0–15.0)
Immature Granulocytes: 0 %
Lymphocytes Relative: 2 %
Lymphs Abs: 0.4 10*3/uL — ABNORMAL LOW (ref 0.7–4.0)
MCH: 29.3 pg (ref 26.0–34.0)
MCHC: 29.4 g/dL — ABNORMAL LOW (ref 30.0–36.0)
MCV: 99.7 fL (ref 80.0–100.0)
Monocytes Absolute: 0.5 10*3/uL (ref 0.1–1.0)
Monocytes Relative: 3 %
Neutro Abs: 14.1 10*3/uL — ABNORMAL HIGH (ref 1.7–7.7)
Neutrophils Relative %: 95 %
Platelets: 256 10*3/uL (ref 150–400)
RBC: 3.89 MIL/uL (ref 3.87–5.11)
RDW: 14.4 % (ref 11.5–15.5)
WBC: 15 10*3/uL — ABNORMAL HIGH (ref 4.0–10.5)
nRBC: 0.1 % (ref 0.0–0.2)

## 2021-05-05 LAB — ECHOCARDIOGRAM COMPLETE
AR max vel: 2.38 cm2
AV Area VTI: 2.11 cm2
AV Area mean vel: 2.08 cm2
AV Mean grad: 5 mmHg
AV Peak grad: 7.8 mmHg
Ao pk vel: 1.4 m/s
Area-P 1/2: 4.1 cm2
Height: 60 in
S' Lateral: 3.1 cm
Weight: 3731.95 oz

## 2021-05-05 LAB — GLUCOSE, CAPILLARY
Glucose-Capillary: 125 mg/dL — ABNORMAL HIGH (ref 70–99)
Glucose-Capillary: 134 mg/dL — ABNORMAL HIGH (ref 70–99)
Glucose-Capillary: 158 mg/dL — ABNORMAL HIGH (ref 70–99)
Glucose-Capillary: 162 mg/dL — ABNORMAL HIGH (ref 70–99)
Glucose-Capillary: 189 mg/dL — ABNORMAL HIGH (ref 70–99)
Glucose-Capillary: 219 mg/dL — ABNORMAL HIGH (ref 70–99)

## 2021-05-05 LAB — BASIC METABOLIC PANEL
Anion gap: 10 (ref 5–15)
BUN: 54 mg/dL — ABNORMAL HIGH (ref 8–23)
CO2: 31 mmol/L (ref 22–32)
Calcium: 8.2 mg/dL — ABNORMAL LOW (ref 8.9–10.3)
Chloride: 103 mmol/L (ref 98–111)
Creatinine, Ser: 1.69 mg/dL — ABNORMAL HIGH (ref 0.44–1.00)
GFR, Estimated: 32 mL/min — ABNORMAL LOW (ref >60)
Glucose, Bld: 207 mg/dL — ABNORMAL HIGH (ref 70–99)
Potassium: 3.8 mmol/L (ref 3.5–5.1)
Sodium: 144 mmol/L (ref 135–145)

## 2021-05-05 LAB — HEMOGLOBIN A1C
Hgb A1c MFr Bld: 5.8 % — ABNORMAL HIGH (ref 4.8–5.6)
Mean Plasma Glucose: 119.76 mg/dL

## 2021-05-05 LAB — MAGNESIUM: Magnesium: 2.1 mg/dL (ref 1.7–2.4)

## 2021-05-05 LAB — PHOSPHORUS: Phosphorus: 1.4 mg/dL — ABNORMAL LOW (ref 2.5–4.6)

## 2021-05-05 MED ORDER — SODIUM PHOSPHATES 45 MMOLE/15ML IV SOLN
30.0000 mmol | Freq: Once | INTRAVENOUS | Status: AC
  Administered 2021-05-05: 30 mmol via INTRAVENOUS
  Filled 2021-05-05: qty 10

## 2021-05-05 MED ORDER — ALBUTEROL SULFATE (2.5 MG/3ML) 0.083% IN NEBU
2.5000 mg | INHALATION_SOLUTION | RESPIRATORY_TRACT | Status: DC | PRN
  Administered 2021-05-05 – 2021-05-14 (×5): 2.5 mg via RESPIRATORY_TRACT
  Filled 2021-05-05 (×3): qty 3

## 2021-05-05 MED ORDER — LEVETIRACETAM 100 MG/ML PO SOLN
500.0000 mg | Freq: Two times a day (BID) | ORAL | Status: DC
  Administered 2021-05-05 – 2021-05-13 (×16): 500 mg
  Filled 2021-05-05 (×18): qty 5

## 2021-05-05 MED ORDER — POTASSIUM CHLORIDE 20 MEQ PO PACK
40.0000 meq | PACK | Freq: Once | ORAL | Status: AC
  Administered 2021-05-05: 40 meq
  Filled 2021-05-05: qty 2

## 2021-05-05 MED ORDER — METHYLPREDNISOLONE SODIUM SUCC 40 MG IJ SOLR
40.0000 mg | Freq: Two times a day (BID) | INTRAMUSCULAR | Status: DC
  Administered 2021-05-05 – 2021-05-07 (×5): 40 mg via INTRAVENOUS
  Filled 2021-05-05 (×6): qty 1

## 2021-05-05 MED ORDER — FUROSEMIDE 10 MG/ML IJ SOLN
60.0000 mg | Freq: Once | INTRAMUSCULAR | Status: AC
  Administered 2021-05-05: 60 mg via INTRAVENOUS
  Filled 2021-05-05: qty 6

## 2021-05-05 MED ORDER — ARFORMOTEROL TARTRATE 15 MCG/2ML IN NEBU
15.0000 ug | INHALATION_SOLUTION | Freq: Two times a day (BID) | RESPIRATORY_TRACT | Status: DC
  Administered 2021-05-05 – 2021-05-16 (×22): 15 ug via RESPIRATORY_TRACT
  Filled 2021-05-05 (×22): qty 2

## 2021-05-05 MED ORDER — REVEFENACIN 175 MCG/3ML IN SOLN
175.0000 ug | Freq: Every day | RESPIRATORY_TRACT | Status: DC
  Administered 2021-05-05 – 2021-05-06 (×2): 175 ug via RESPIRATORY_TRACT
  Filled 2021-05-05 (×2): qty 3

## 2021-05-05 NOTE — Progress Notes (Signed)
Echo results reviewed.  Showed large pericardial effusion.  Consult placed with cardiology.  Informed pt's husband by phone about findings.  Chesley Mires, MD [place] Pager - [phone] 05/05/2021, 2:17 PM

## 2021-05-05 NOTE — Progress Notes (Signed)
NAME:  Lisa Hamilton, MRN:  [id number], DOB:  1946/04/15, LOS: 2 ADMISSION DATE:  04/28/2021, CONSULTATION DATE:  9/29 REFERRING MD:  Dr. Lorin Mercy, CHIEF COMPLAINT:  Cardiac arrest   History of Present Illness:  75 yr old female followed by Dr. Keturah Barre for COPD from asthma and chronic respiratory failure on 2 to 3 liters oxygen developed progressive cough, dyspnea, wheezing, and weakness.  Her husband reports she received COVID vaccine booster 2 days prior to developing symptoms.  She presented to ER on 9/28 and started on Bipap for acute on chronic hypoxic/hypercapnic respiratory failure.  She was started on steroids and antibiotics.  Had initial improvement and transitioned off Bipap per patient request.  Several hours later she then developed bradycardia leading to asystolic cardiac arrest.  Had ROSC after about 15 minutes.  Intubated and transferred to ICU.  Pertinent  Medical History  Anemia, Diverticulosis, DJD, GERD, Glaucoma, Hepatic cyst, Hiatal hernia, Colon polyps, HLD, HTN, Medullary sponge kidney, Goiter, Osteoporosis, SBO, Nephrolithiasis, RLS, Shingles, Vit D deficiency  Significant Hospital Events: Including procedures, antibiotic start and stop dates in addition to other pertinent events   9/28 Admit 9/29 respiratory leading to cardiac arrest, intubated and transferred to ICU 9/30 tonic-clonic seizure and started on keppra, developed transient A fib and started on amiodarone, started on peripheral phenylephrine, Lt IJ CVL placed 10/1 back in sinus rhythm, off pressors, remains on increased PEEP/FiO2  Interim History / Subjective:  Still on amiodarone.  PEEP/FiO2 increased overnight.  Not much urine outpt from lasix overnight.  Off pressors.  In sinus rhythm.  Objective   Blood pressure 113/67, pulse 92, temperature 99.1 F (37.3 C), resp. rate (!) 23, height 5' (1.524 m), weight 105.8 kg, SpO2 93 %. CVP:  [13 mmHg-18 mmHg] 18 mmHg  Vent Mode: PRVC FiO2 (%):  [50 %-100 %]  70 % Set Rate:  [20 bmp] 20 bmp Vt Set:  [370 mL] 370 mL PEEP:  [5 cmH20-8 cmH20] 8 cmH20 Plateau Pressure:  [21 cmH20-27 cmH20] 23 cmH20   Intake/Output Summary (Last 24 hours) at 05/05/2021 0757 Last data filed at 05/05/2021 0730 Gross per 24 hour  Intake 3062.35 ml  Output 40 ml  Net 3022.35 ml   Filed Weights   04/22/2021 1846 05/04/21 0545 05/05/21 0500  Weight: 99.8 kg 101.8 kg 105.8 kg    Examination:  General - sedated Eyes - pupils reactive ENT - ETT in place Cardiac - regular rate/rhythm, no murmur Chest - scattered rhonchi, no wheeze Abdomen - soft, non tender, + bowel sounds Extremities - 1+ edema Skin - no rashes Neuro - RASS 0  Resolved Hospital Problem list   Cardiogenic shock  Assessment & Plan:   Acute on chronic hypoxic/hypercapnic respiratory failure in setting of asthma/COPD exacerbation with CAP. - not ready for vent weaning at this time - adjust PEEP/FiO2 to keep SpO2 > 92% - day 3 of ABx, currently on rocephin and zithromax - change solumedrol to 40 mg q12h - add yupelri, brovana and continue pulmicort, singulair - prn albuterol - f/u CXR  Respiratory leading to cardiac arrest. New onset atrial fibrillation with RVR >> back in sinus rhythm on 10/1. Hx of HTN, HLD. - f/u Echo - continue amiodarone - continue ASA, pravachol - lasix 60 mg IV x one - hold outpt cozaar  Acute metabolic encephalopathy from hypoxia, hypercapnia. Isolated seizure after cardiac arrest on 9/30. Hx of RLS. - RASS goal 0 to -1 - CT head normal - f/u EEG -  continue keppra for now - defer neurology assessment unless she has recurrent seizure - continue klonopin, neurontin, requip  Steroid induced hyperglycemia. - SSI  Hypokalemia. Hypophosphatemia. - f/u electrolytes  Hx of osteoporosis. - hold outpt fosamax  Best Practice (right click and "Reselect all SmartList Selections" daily)   Diet/type: tubefeeds DVT prophylaxis: prophylactic heparin  GI  prophylaxis: PPI Lines: N/A Foley:  Yes, and it is still needed Code Status:  full code Family: Updated pt's husband at bedside on 05/04/21.  Labs    CMP Latest Ref Rng & Units 05/05/2021 05/04/2021 05/04/2021  Glucose 70 - 99 mg/dL 207(H) 163(H) -  BUN 8 - 23 mg/dL 54(H) 39(H) -  Creatinine 0.44 - 1.00 mg/dL 1.69(H) 0.97 -  Sodium 135 - 145 mmol/L 144 145 144  Potassium 3.5 - 5.1 mmol/L 3.8 4.7 4.3  Chloride 98 - 111 mmol/L 103 101 -  CO2 22 - 32 mmol/L 31 31 -  Calcium 8.9 - 10.3 mg/dL 8.2(L) 9.0 -  Total Protein 6.5 - 8.1 g/dL - - -  Total Bilirubin 0.3 - 1.2 mg/dL - - -  Alkaline Phos 38 - 126 U/L - - -  AST 15 - 41 U/L - - -  ALT 0 - 44 U/L - - -    CBC Latest Ref Rng & Units 05/05/2021 05/04/2021 05/04/2021  WBC 4.0 - 10.5 K/uL 15.0(H) 20.2(H) -  Hemoglobin 12.0 - 15.0 g/dL 11.4(L) 13.5 13.9  Hematocrit 36.0 - 46.0 % 38.8 47.9(H) 41.0  Platelets 150 - 400 K/uL 256 356 -    ABG    Component Value Date/Time   PHART 7.311 (L) 05/04/2021 0002   PCO2ART 72.7 (HH) 05/04/2021 0002   PO2ART 84 05/04/2021 0002   HCO3 36.7 (H) 05/04/2021 0002   TCO2 39 (H) 05/04/2021 0002   O2SAT 95.0 05/04/2021 0002    CBG (last 3)  Recent Labs    05/04/21 2321 05/05/21 0328 05/05/21 0725  GLUCAP 172* 189* 162*     Critical care time: 34 minutes  Chesley Mires, MD [place] Pager - [phone] 05/05/2021, 7:57 AM

## 2021-05-05 NOTE — Progress Notes (Signed)
[facility] Progress Note Patient Name: Lisa Hamilton DOB: 1946/05/12 MRN: [id number]   Date of Service  05/05/2021  HPI/Events of Note  Continued sats of 90, had low sats today, on vent of 80/8/20/370, Bedside RN will notify RT. Goals of sats are 92%, last ABG's on 9/30,  Echo showed large pericardial effusion with need for pericardiocentesis per Cards, do we need to add Coag test for AM.   eICU Interventions  Bedside team increased FiO2 to 100% with improvement in saturation to 92% at this time. This seems to occur when patient is laid flat during care.  Titrate down FiO2 as tolerated to keep sats > 92% PT INR ordered Discussed with bedside RN     Intervention Category Major Interventions: Hypoxemia - evaluation and management  Judd Lien 05/05/2021, 10:08 PM

## 2021-05-05 NOTE — Progress Notes (Signed)
D/C [facility] notiifed  No skin breakdown at   [place]  FP2  A1  A2

## 2021-05-05 NOTE — Progress Notes (Signed)
EEG complete - results pending 

## 2021-05-05 NOTE — Progress Notes (Signed)
  Echocardiogram 2D Echocardiogram has been performed.  Lisa Hamilton 05/05/2021, 11:30 AM

## 2021-05-05 NOTE — Consult Note (Signed)
Cardiology Consultation:   Patient ID: Lisa Hamilton; [id number]; Jan 31, 1946   Admit date: 04/28/2021 Date of Consult: 05/05/2021  Primary Care Provider: Hoyt Koch, MD Primary Cardiologist: None  Primary Electrophysiologist:     Patient Profile:   Lisa Hamilton is a 75 y.o. female with a hx of COPD who is being seen today for the evaluation of pericardial effusion at the request of Dr. Halford Chessman.  History of Present Illness:   [name] has no prior cardiac history. She presented with acute respiratory failure.  She has chronic oxygen dependent COPD and asthma.  She has been treated with steroids and antibiotics.  She was initially treated with BiPAP.  During this hospitalization she had bradycardia and asystolic arrest.  She was intubated and transferred to the ICU.  Resuscitation apparently was 15 minutes.  This was on 9/29.  She was noted to have seizures on 930.  She had transient atrial fibrillation.  She did require vasopressors.  She is being treated with amiodarone.  She had an echocardiogram which demonstrates large pericardial effusion we are consulted for this.  She has well-preserved ejection fraction.  There are no echocardiographic suggestions of tamponade.  The patient is currently intubated and sedated.  She is hypotensive.  CVP is 18.  She is off of vasopressors.  I do see a prior echocardiogram in 2014.  She had normal ejection fraction.  There was a trivial pericardial effusion.  She otherwise has no past cardiac history.  She can go out grocery shopping walking behind a cart but otherwise is fairly limited.  She is chronically on oxygen.  The history comes from her husband and is she is awake and responsive on the vent.  There is been no palpitations, presyncope or syncope.  She has not had any chest pressure, neck or arm discomfort.  She has had no fevers or chills.  She does sleep chronically in a chair.  Past Medical History:  Diagnosis Date   Anemia    Asthmatic  bronchitis    Blood transfusion without reported diagnosis 12/2017   Cataract    Constipation    chronic   COPD (chronic obstructive pulmonary disease) (HCC)    Diverticulosis of colon    DJD (degenerative joint disease)    GERD (gastroesophageal reflux disease)    rare   Glaucoma 1977   EYE SURGERY  FOR TX OF GLAUCOMA--NO LONGER HAS GLAUCOMA   Hepatic cyst    s/p surgical intervention 05/2011 -[place]   Hiatal hernia    Hx of colonic polyps    Hyperlipidemia    Hypertension    Medullary sponge kidney    Nontoxic multinodular goiter    pt unsure of this/ per pt no one told her!   Obesity    Osteoporosis    Partial small bowel obstruction ([place]) 10/15/2017   Renal calculus    s/p lithotripsy 06/2011   Restless leg syndrome    Shingles 2009   Squamous cell carcinoma of wrist, right    scalp, right wrist   Venous insufficiency    Vitamin D deficiency     Past Surgical History:  Procedure Laterality Date   bilateral carpel tunnel repairs     Dr. Shellia Carwin  left--1999   right--2008   bilateral wrist fracture, right  1996   left 2001, Dr. Daylene Katayama   CATARACT EXTRACTION  04/07/13 R, 05/18/13 L   [facility]   COLONOSCOPY     COLONOSCOPY WITH PROPOFOL N/A 09/10/2018  Procedure: COLONOSCOPY WITH PROPOFOL;  Surgeon: Milus Banister, MD;  Location: [facility];  Service: Endoscopy;  Laterality: N/A;   eye surgery Left 11/22/2014   "MD pucker" repair   FOOT SURGERY     left foot surgery/ cut off part of the bone due to cleft foot   [place]  2012   umb hernia/ pt states it was done in 10/2017 again   INSERTION OF MESH N/A 10/15/2017   Procedure: INSERTION OF MESH;  Surgeon: Donnie Mesa, MD;  Location: [place];  Service: General;  Laterality: N/A;   LAPAROSCOPIC LIVER CYST FENESTRATION  05/2011   LAPAROSCOPIC PARTIAL HEPATECTOMY N/A 12/04/2017   Procedure: LAPAROSCOPIC PARTIAL HEPATECTOMY;  Surgeon: [name], MD;  Location: [place];  Service: General;  Laterality: N/A;   LIPOMA EXCISION  08/13/2012   Procedure: EXCISION LIPOMA;  Surgeon: Magnus Sinning, MD;  Location: [facility];  Service: Orthopedics;  Laterality: Left;   LITHOTRIPSY  06/16/2018   POLYPECTOMY  09/10/2018   Procedure: POLYPECTOMY;  Surgeon: Milus Banister, MD;  Location: [facility];  Service: Endoscopy;;   SHOULDER OPEN ROTATOR CUFF REPAIR  08/13/2012   Procedure: ROTATOR CUFF REPAIR SHOULDER OPEN;  Surgeon: Magnus Sinning, MD;  Location: [facility];  Service: Orthopedics;  Laterality: Left;  LEFT SHOULDER ANTERIOR ACROMINECTOMY AND ROTATOR CUFF REPAIR AND EXCISION LEFT SHOULDER LIPOMA   SQUAMOUS CELL CARCINOMA EXCISION  2010   L [place]   TONSILLECTOMY  1967   VENTRAL HERNIA REPAIR N/A 10/15/2017   Procedure: HERNIA REPAIR VENTRAL ADULT;  Surgeon: Donnie Mesa, MD;  Location: [place];  Service: General;  Laterality: N/A;     Home Medications:  Prior to Admission medications   Medication Sig Start Date End Date Taking? Authorizing Provider  acetaminophen (TYLENOL) 500 MG tablet Take 1,000 mg by mouth 3 (three) times daily as needed for moderate pain or headache.   Yes [provider]  albuterol (PROVENTIL) (2.5 MG/3ML) 0.083% nebulizer solution USE 1 VIAL IN NEBULIZER 4 TIMES DAILY. Generic: VENTOLIN Patient taking differently: Take 2.5 mg by nebulization 4 (four) times daily. 09/20/20  Yes Young, Tarri Fuller D, MD  albuterol (VENTOLIN HFA) 108 (90 Base) MCG/ACT inhaler USE 1 TO 2 INHALATIONS BY  MOUTH EVERY 6 HOURS AS  NEEDED FOR WHEEZING OR  SHORTNESS OF BREATH Patient taking differently: Inhale 1-2 puffs into the lungs every 6 (six) hours as needed for wheezing or shortness of breath. 07/06/20  Yes Young, Tarri Fuller D, MD  alendronate (FOSAMAX) 70 MG tablet Take 1 tablet (70 mg total) by mouth every 7 (seven) days. Take with a full glass of water on an empty stomach. Patient taking differently: Take 70 mg by mouth every Tuesday. Take with a full glass of  water on an empty stomach. 11/10/20  Yes Hoyt Koch, MD  aspirin EC 81 MG tablet Take 81 mg by mouth at bedtime.   Yes [provider]  clonazePAM (KLONOPIN) 0.5 MG tablet TAKE 1 TO 3 TABLETS BY  MOUTH 30 MINUTES BEFORE  BEDTIME Patient taking differently: Take 0.5-1.5 mg by mouth See admin instructions. 30 MINUTES BEFORE  BEDTIME 03/08/21  Yes Young, Tarri Fuller D, MD  Co-Enzyme Q-10 100 MG CAPS Take 100 mg by mouth daily.   Yes [provider]  Cranberry 180 MG CAPS Take 180 mg by mouth daily.   Yes [provider]  donepezil (ARICEPT) 10 MG tablet Take  1 tablet (10 mg total) by mouth at bedtime. 09/15/20  Yes Hoyt Koch, MD  fluticasone [facility]) 50 MCG/ACT nasal spray Place 2 sprays into both nostrils daily. Patient taking differently: Place 2 sprays into both nostrils daily as needed for allergies. 01/08/19  Yes Hoyt Koch, MD  fluticasone furoate-vilanterol (BREO ELLIPTA) 100-25 MCG/INH AEPB Inhale 1 puff then rinse mouth,once daily Patient taking differently: Inhale 1 puff into the lungs daily. 01/04/21  Yes Young, Tarri Fuller D, MD  gabapentin (NEURONTIN) 300 MG capsule Take 1 capsule (300 mg total) by mouth 3 (three) times daily. 07/06/20  Yes Young, Tarri Fuller D, MD  guaiFENesin (MUCINEX) 600 MG 12 hr tablet Take 600 mg by mouth 2 (two) times daily as needed (congestion).    Yes [provider]  ibuprofen (ADVIL,MOTRIN) 200 MG tablet Take 400 mg by mouth 3 (three) times daily as needed for headache or moderate pain.   Yes [provider]  losartan (COZAAR) 50 MG tablet Take 1 tablet (50 mg total) by mouth daily. 09/14/20  Yes Hoyt Koch, MD  Magnesium 400 MG CAPS Take 400 mg by mouth daily.   Yes [provider]  montelukast (SINGULAIR) 10 MG tablet Take 1 tablet (10 mg total) by mouth every evening. 07/06/20  Yes Young, Tarri Fuller D, MD  Multiple Vitamins-Minerals (PRESERVISION AREDS 2) CAPS Take 1 capsule by mouth 2  (two) times daily.   Yes [provider]  OXYGEN Inhale 3-4 L into the lungs See admin instructions. 3 liters during the day 4 liters at bedtime   Yes [provider]  polyethylene glycol (MIRALAX / GLYCOLAX) packet Take 17 g by mouth 3 (three) times daily as needed for moderate constipation.   Yes [provider]  pravastatin (PRAVACHOL) 40 MG tablet Take 1 tablet (40 mg total) by mouth daily. 09/14/20  Yes Hoyt Koch, MD  Propylene Glycol (SYSTANE BALANCE OP) Place 1 drop into both eyes daily as needed (dry eyes).   Yes [provider]  rOPINIRole (REQUIP) 0.5 MG tablet Take up to 6 tablets by mouth daily as needed Patient taking differently: Take 1 mg by mouth 3 (three) times daily. 07/06/20  Yes Young, Tarri Fuller D, MD  traMADol (ULTRAM) 50 MG tablet Take 1 tablet (50 mg total) by mouth daily as needed. Patient taking differently: Take 50 mg by mouth daily as needed for moderate pain. 07/08/17  Yes Hoyt Koch, MD  Turmeric 500 MG CAPS Take 500 mg by mouth daily.   Yes [provider]  vitamin B-12 (CYANOCOBALAMIN) 1000 MCG tablet Take 1,000 mcg by mouth daily.   Yes [provider]  furosemide (LASIX) 20 MG tablet Take 1 tablet (20 mg total) by mouth daily. Patient not taking: No sig reported 12/09/19   Baird Lyons D, MD  predniSONE (DELTASONE) 10 MG tablet Take 4 tablets (40 mg total) by mouth daily with breakfast for 2 days, THEN 3 tablets (30 mg total) daily with breakfast for 2 days, THEN 2 tablets (20 mg total) daily with breakfast for 2 days, THEN 1 tablet (10 mg total) daily with breakfast for 2 days. 05/01/2021 05/10/21  Deneise Lever, MD    Inpatient Medications: Scheduled Meds:  arformoterol  15 mcg Nebulization BID   aspirin  81 mg Per Tube QHS   budesonide (PULMICORT) nebulizer solution  0.5 mg Nebulization BID   chlorhexidine gluconate (MEDLINE KIT)  15 mL Mouth Rinse BID   Chlorhexidine Gluconate Cloth  6  each Topical Daily   clonazePAM  1 mg Per Tube QHS   docusate  100 mg Per Tube BID   feeding supplement (PROSource TF)  45 mL Per Tube Daily   gabapentin  100 mg Per Tube Q8H   heparin  5,000 Units Subcutaneous Q8H   insulin aspart  0-20 Units Subcutaneous Q4H   levETIRAcetam  500 mg Per Tube BID   mouth rinse  15 mL Mouth Rinse 10 times per day   methylPREDNISolone (SOLU-MEDROL) injection  40 mg Intravenous Q12H   montelukast  10 mg Per Tube QPM   pantoprazole sodium  40 mg Per Tube Daily   polyethylene glycol  17 g Per Tube Daily   pravastatin  40 mg Per Tube Daily   revefenacin  175 mcg Nebulization Daily   rOPINIRole  1 mg Per Tube TID   sodium chloride flush  3 mL Intravenous Q12H   Continuous Infusions:  sodium chloride     amiodarone 30 mg/hr (05/05/21 1100)   azithromycin 250 mL/hr at 05/05/21 1100   cefTRIAXone (ROCEPHIN)  IV Stopped (05/05/21 1021)   feeding supplement (VITAL 1.5 CAL) 1,000 mL (05/05/21 0330)   PRN Meds: acetaminophen **OR** acetaminophen, albuterol, fentaNYL (SUBLIMAZE) injection, hydrALAZINE, midazolam, [DISCONTINUED] ondansetron **OR** ondansetron (ZOFRAN) IV  Allergies:    Allergies  Allergen Reactions   Hydromorphone Hcl Other (See Comments)    Disorientation and delirium.    Penicillins Rash and Other (See Comments)    PATIENT HAS HAD A PCN REACTION WITH IMMEDIATE RASH, FACIAL/TONGUE/THROAT SWELLING, SOB, OR LIGHTHEADEDNESS WITH HYPOTENSION:  #  #  YES  #  #  Has patient had a PCN reaction causing severe rash involving mucus membranes or skin necrosis: No Has patient had a PCN reaction that required hospitalization: No Has patient had a PCN reaction occurring within the last 10 years: No If all of the above answers are "NO", then may proceed with Cephalosporin use.    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  Tobacco Use   Smoking status: Never   Smokeless tobacco: Never  Vaping Use   Vaping Use: Never used  Substance and Sexual Activity   Alcohol use: Yes    Alcohol/week: 0.0 standard drinks    Comment: social use   Drug use: No   Sexual activity: Never  Other Topics Concern   Not on file  Social History Narrative   Not on file   Social Determinants of Health   Financial Resource Strain: Low Risk    Difficulty of Paying Living Expenses: Not very hard  Food Insecurity: No Food Insecurity   Worried About Running Out of Food in the Last Year: Never true   Ran Out of Food in the Last Year: Never true  Transportation Needs: No Transportation Needs   Lack of Transportation (Medical): No   Lack of Transportation (Non-Medical): No  Physical Activity: Inactive   Days of Exercise per Week: 0 days   Minutes of Exercise per Session: 0 min  Stress: No Stress Concern Present   Feeling of Stress : Not at all  Social Connections: Socially Integrated   Frequency of Communication with Friends and Family: More than three times a week   Frequency of Social Gatherings with Friends and Family: More than three times a week   Attends Religious Services: More than 4 times per year   Active Member of Clubs or Organizations: Yes   Attends Music therapist: More than 4 times per year   Marital Status: Married  Human resources officer Violence: Not on file    Family History:    Family History  Problem Relation Age of Onset   Lung cancer Father    Colon cancer Father        possible colon cancer, but unsure   Lung cancer Sister    Irritable bowel syndrome Sister    Obstructive Sleep Apnea Sister     Osteoarthritis Sister    Esophageal cancer Neg Hx    Rectal cancer Neg Hx    Stomach cancer Neg Hx      ROS:  Unable to assess as the patient is intubated  Physical Exam/Data:   Vitals:   05/05/21 1230 05/05/21 1245 05/05/21 1300 05/05/21 1315  BP: 115/67 107/60 109/66 101/62  Pulse: 92 95 91 88  Resp: (!) 25 (!) 23 (!) 22 (!) 23  Temp: 98.8 F (37.1 C) 98.8 F (37.1 C) 98.6 F (37 C) 98.6 F (37 C)  TempSrc:      SpO2: (!) 88% (!) 88% 91% 90%  Weight:      Height:        Intake/Output Summary (Last 24 hours) at 05/05/2021 1440 Last data filed at 05/05/2021 1100 Gross per 24 hour  Intake 2978.17 ml  Output 40 ml  Net 2938.17 ml   Filed Weights   05/04/2021 1846 05/04/21 0545 05/05/21 0500  Weight: 99.8 kg 101.8 kg 105.8 kg   Body mass index is 45.55 kg/m.  GENERAL: Chronically ill appearing HEENT:   Pupils equal round and reactive, fundi not visualized, NECK:  No obvious jugular venous distention though the exam is very limited LYMPHATICS:  No cervical, inguinal adenopathy, again the exam is limited LUNGS: Decreased breath sounds bilaterally  BACK:  No CVA tenderness CHEST:   Unremarkable HEART:  PMI not displaced or sustained,S1 and S2 within normal limits, no S3, no S4, no clicks, no rubs, no murmurs ABD:  Flat, positive bowel sounds normal in frequency in pitch, no bruits, no rebound, no guarding, no midline pulsatile mass, no hepatomegaly, no splenomegaly EXT:  2 plus pulses throughout, no  edema, no cyanosis no clubbing SKIN:  No rashes no nodules NEURO:   Cranial nerves II through XII grossly intact, motor grossly intact throughout PSYCH:   Answers questions appropriately yes no shaking her head  EKG:  The EKG was personally reviewed and demonstrates: 05/03/2021 atrial fibrillation rate 163, axis within normal limits, intervals within normal limits, no acute ischemic changes. Telemetry:  Telemetry was personally reviewed and demonstrates: Normal sinus  rhythm  Relevant CV Studies:  Echo:  05/05/2021  1. Left ventricular ejection fraction, by estimation, is 60 to 65%. The  left ventricle has normal function. The left ventricle has no regional  wall motion abnormalities. Left ventricular diastolic parameters are  indeterminate.   2. Right ventricular systolic function is normal. The right ventricular  size is normal. Tricuspid regurgitation signal is inadequate for assessing  PA pressure.   3. The mitral valve is  normal in structure. No evidence of mitral valve  regurgitation. No evidence of mitral stenosis.   4. The aortic valve is tricuspid. Aortic valve regurgitation is not  visualized. Mild aortic valve sclerosis is present, with no evidence of  aortic valve stenosis.   5. The inferior vena cava is normal in size with greater than 50%  respiratory variability, suggesting right atrial pressure of 3 mmHg.   6. Large pericardial effusion. The pericardial effusion is  circumferential. The effusion measures 2.42cm at greatest diameter  posteriorly. There is RA inversion but no RV diastolic collapse and the  IVC is normal size and collapses > 50% with expiration.   There is normal MV inflow pattern with respirations. No findings of  impending tamponade but this is a clinical diagnosis made at the bedside.   Laboratory Data:  Chemistry Recent Labs  Lab 04/30/2021 1847 05/03/21 0749 05/04/21 0002 05/04/21 0300 05/05/21 0330  NA 141   < > 144 145 144  K 4.0   < > 4.3 4.7 3.8  CL 99  --   --  101 103  CO2 35*  --   --  31 31  GLUCOSE 145*  --   --  163* 207*  BUN 24*  --   --  39* 54*  CREATININE 0.56  --   --  0.97 1.69*  CALCIUM 9.4  --   --  9.0 8.2*  GFRNONAA >60  --   --  >60 32*  ANIONGAP 7  --   --  13 10   < > = values in this interval not displayed.    Recent Labs  Lab 04/24/2021 1847  PROT 6.8  ALBUMIN 3.3*  AST 20  ALT 19  ALKPHOS 105  BILITOT 1.3*   Hematology Recent Labs  Lab 04/30/2021 1847  05/03/21 0749 05/04/21 0002 05/04/21 0300 05/05/21 0330  WBC 9.4  --   --  20.2* 15.0*  RBC 4.04  --   --  4.60 3.89  HGB 12.0   < > 13.9 13.5 11.4*  HCT 42.7   < > 41.0 47.9* 38.8  MCV 105.7*  --   --  104.1* 99.7  MCH 29.7  --   --  29.3 29.3  MCHC 28.1*  --   --  28.2* 29.4*  RDW 13.3  --   --  13.6 14.4  PLT 365  --   --  356 256   < > = values in this interval not displayed.   Cardiac EnzymesNo results for input(s): TROPONINI in the last 168 hours. No results for input(s): TROPIPOC in the last 168 hours.  BNP Recent Labs  Lab 04/26/2021 1508 04/12/2021 1847  BNP  --  132.3*  PROBNP 333*  --     DDimer  Recent Labs  Lab 04/20/2021 1515  DDIMER 3.04*    Radiology/Studies:  DG Chest 2 View  Result Date: 05/04/2021 CLINICAL DATA:  Cough shortness of breath and center of chest tightness for 3 days in a 75 year old female. EXAM: CHEST - 2 VIEW COMPARISON:  May 02, 2021. FINDINGS: Trachea slightly to the RIGHT of midline, image rotated slightly to the RIGHT. Accounting for imaged position cardiomediastinal contours are stable and obscured by overlying soft tissues and basilar airspace disease and pleural effusions. The appearance of the chest is unchanged compared to the previous radiograph. Lung volumes remain low. On limited assessment there is no acute skeletal process. IMPRESSION: No change in the appearance of the chest  compared to the previous study. Signs of pleural effusions and basilar airspace disease with low lung volumes. Electronically Signed   By: Zetta Bills M.D.   On: 04/08/2021 19:44   DG Chest 2 View  Result Date: 04/14/2021 CLINICAL DATA:  Shortness of breath. EXAM: CHEST - 2 VIEW COMPARISON:  Chest x-ray dated January 04, 2021. FINDINGS: Cardiomegaly. New moderate bilateral pleural effusions. No pneumothorax. No acute osseous abnormality. IMPRESSION: 1. New moderate bilateral pleural effusions. Electronically Signed   By: Titus Dubin M.D.   On: 05/01/2021  16:17   CT HEAD WO CONTRAST (5MM)  Result Date: 05/04/2021 CLINICAL DATA:  Seizure.  Weakness. EXAM: CT HEAD WITHOUT CONTRAST TECHNIQUE: Contiguous axial images were obtained from the base of the skull through the vertex without intravenous contrast. COMPARISON:  None. FINDINGS: Brain: There is no evidence of an acute infarct, intracranial hemorrhage, mass, midline shift, or extra-axial fluid collection. The ventricles and sulci are normal. Vascular: Calcified atherosclerosis at the skull base. No hyperdense vessel. Skull: No fracture or suspicious osseous lesion. Sinuses/Orbits: Clear paranasal sinuses. Small left mastoid effusion. Bilateral cataract extraction. Other: Partially visualized endotracheal and enteric tubes. IMPRESSION: Unremarkable CT appearance of the brain. Electronically Signed   By: Logan Bores M.D.   On: 05/04/2021 12:51   DG Chest Port 1 View  Result Date: 05/05/2021 CLINICAL DATA:  Encounter for respiratory failure EXAM: PORTABLE CHEST 1 VIEW COMPARISON:  May 04, 2021 FINDINGS: The ETT is in good position. Left central line is stable. The NG tube terminates below today's film. No pneumothorax. Opacity diffusely throughout the right lung persists. There is effusion with underlying opacity in left base. The cardiomediastinal silhouette is stable. IMPRESSION: Diffuse opacity in the right lung and more focal opacity in left base. The opacities are similar in the interval and could represent multifocal pneumonia, pulmonary edema, or ARDS. Bilateral layering effusions. Support apparatus as above. Electronically Signed   By: Dorise Bullion III M.D.   On: 05/05/2021 06:11   DG CHEST PORT 1 VIEW  Result Date: 05/04/2021 CLINICAL DATA:  Central line placement. EXAM: PORTABLE CHEST 1 VIEW COMPARISON:  Chest x-ray from yesterday. FINDINGS: Unchanged endotracheal and enteric tubes. New left internal jugular central venous catheter with tip at the cavoatrial junction. Stable cardiomegaly.  Worsening perihilar, right upper lobe, left lower lobe airspace disease with increasing small to moderate bilateral pleural effusions. No pneumothorax. No acute osseous abnormality. IMPRESSION: 1. New left internal jugular central venous catheter with tip at the cavoatrial junction. No pneumothorax. 2. Worsening airspace disease in both lungs which could reflect multifocal pneumonia, pulmonary edema, or ARDS. Increasing small to moderate bilateral pleural effusions. Electronically Signed   By: Titus Dubin M.D.   On: 05/04/2021 18:42   DG Chest Port 1 View  Result Date: 05/03/2021 CLINICAL DATA:  Post intubation. EXAM: PORTABLE CHEST 1 VIEW COMPARISON:  Chest x-ray 04/16/2021. FINDINGS: Examination is limited secondary to patient rotation and overlying external artifact. Endotracheal tube tip is just above the level of the carina approximately 1 cm. There is a new enteric tube, but distal tip is not confirmed on this study. The heart is enlarged, unchanged. There is airspace disease in the right upper lobe and left lung base. There are small bilateral pleural effusions. No pneumothorax visualized. No acute fractures are seen. IMPRESSION: 1. Endotracheal tube tip 1 cm above carina.  Consider repositioning. 2. New enteric tube.  Distal tip cannot be confirmed on this study. 3. Right upper lobe airspace disease. 4.  Small bilateral pleural effusions. 5. Cardiomegaly. Electronically Signed   By: Ronney Asters M.D.   On: 05/03/2021 23:41   DG Abd Portable 1V  Result Date: 05/04/2021 CLINICAL DATA:  Nasogastric tube placement EXAM: PORTABLE ABDOMEN - 1 VIEW COMPARISON:  None. FINDINGS: Nasogastric tube tip and side port project within the stomach. IMPRESSION: Nasogastric tube tip and side port in the stomach. Electronically Signed   By: Ulyses Jarred M.D.   On: 05/04/2021 01:27   ECHOCARDIOGRAM COMPLETE  Result Date: 05/05/2021    ECHOCARDIOGRAM REPORT   Patient Name:   Lisa Hamilton Date of Exam: 05/05/2021  Medical Rec #:  [id number]    Height:       60.0 in Accession #:    [id number]   Weight:       233.2 lb Date of Birth:  03-18-1946   BSA:          1.993 m Patient Age:    69 years     BP:           119/67 mmHg Patient Gender: F            HR:           94 bpm. Exam Location:  Inpatient Procedure: 2D Echo, Cardiac Doppler and Color Doppler Indications:    Cardiac arrest  History:        Patient has prior history of Echocardiogram examinations, most                 recent 06/15/2013. COPD; Signs/Symptoms:Dyspnea.  Sonographer:    Clayton Lefort RDCS (AE) Referring Phys: [place] LEWIS  Sonographer Comments: Echo performed with patient supine and on artificial respirator. IMPRESSIONS  1. Left ventricular ejection fraction, by estimation, is 60 to 65%. The left ventricle has normal function. The left ventricle has no regional wall motion abnormalities. Left ventricular diastolic parameters are indeterminate.  2. Right ventricular systolic function is normal. The right ventricular size is normal. Tricuspid regurgitation signal is inadequate for assessing PA pressure.  3. The mitral valve is normal in structure. No evidence of mitral valve regurgitation. No evidence of mitral stenosis.  4. The aortic valve is tricuspid. Aortic valve regurgitation is not visualized. Mild aortic valve sclerosis is present, with no evidence of aortic valve stenosis.  5. The inferior vena cava is normal in size with greater than 50% respiratory variability, suggesting right atrial pressure of 3 mmHg.  6. Large pericardial effusion. The pericardial effusion is circumferential. The effusion measures 2.42cm at greatest diameter posteriorly. There is RA inversion but no RV diastolic collapse and the IVC is normal size and collapses > 50% with expiration.  There is normal MV inflow pattern with respirations. No findings of impending tamponade but this is a clinical diagnosis made at the bedside. Marland Kitchen FINDINGS  Left Ventricle: Left  ventricular ejection fraction, by estimation, is 60 to 65%. The left ventricle has normal function. The left ventricle has no regional wall motion abnormalities. The left ventricular internal cavity size was normal in size. There is  no left ventricular hypertrophy. Left ventricular diastolic parameters are indeterminate. Normal left ventricular filling pressure. Right Ventricle: The right ventricular size is normal. No increase in right ventricular wall thickness. Right ventricular systolic function is normal. Tricuspid regurgitation signal is inadequate for assessing PA pressure. Left Atrium: Left atrial size was normal in size. Right Atrium: Right atrial size was normal in size. Pericardium: The effusion measures 2.42 at greatest diameter posteriorly. A large  pericardial effusion is present. The pericardial effusion is circumferential. There is diastolic collapse of the right atrial wall. Mitral Valve: The mitral valve is normal in structure. No evidence of mitral valve regurgitation. No evidence of mitral valve stenosis. Tricuspid Valve: The tricuspid valve is normal in structure. Tricuspid valve regurgitation is not demonstrated. No evidence of tricuspid stenosis. Aortic Valve: The aortic valve is tricuspid. Aortic valve regurgitation is not visualized. Mild aortic valve sclerosis is present, with no evidence of aortic valve stenosis. Aortic valve mean gradient measures 5.0 mmHg. Aortic valve peak gradient measures 7.8 mmHg. Aortic valve area, by VTI measures 2.11 cm. Pulmonic Valve: The pulmonic valve was normal in structure. Pulmonic valve regurgitation is trivial. No evidence of pulmonic stenosis. Aorta: The aortic root is normal in size and structure. Venous: The inferior vena cava is normal in size with greater than 50% respiratory variability, suggesting right atrial pressure of 3 mmHg. IAS/Shunts: No atrial level shunt detected by color flow Doppler.  LEFT VENTRICLE PLAX 2D LVIDd:         5.00 cm   Diastology LVIDs:         3.10 cm  LV e' medial:    6.09 cm/s LV PW:         0.80 cm  LV E/e' medial:  10.7 LV IVS:        1.00 cm  LV e' lateral:   8.70 cm/s LVOT diam:     2.00 cm  LV E/e' lateral: 7.5 LV SV:         51 LV SV Index:   26 LVOT Area:     3.14 cm  RIGHT VENTRICLE             IVC RV Basal diam:  3.10 cm     IVC diam: 1.30 cm RV S prime:     12.10 cm/s TAPSE (M-mode): 1.9 cm LEFT ATRIUM             Index       RIGHT ATRIUM           Index LA diam:        2.10 cm 1.05 cm/m  RA Area:     14.20 cm LA Vol (A2C):   59.9 ml 30.06 ml/m RA Volume:   33.10 ml  16.61 ml/m LA Vol (A4C):   59.4 ml 29.81 ml/m LA Biplane Vol: 61.5 ml 30.86 ml/m  AORTIC VALVE AV Area (Vmax):    2.38 cm AV Area (Vmean):   2.08 cm AV Area (VTI):     2.11 cm AV Vmax:           140.00 cm/s AV Vmean:          99.600 cm/s AV VTI:            0.241 m AV Peak Grad:      7.8 mmHg AV Mean Grad:      5.0 mmHg LVOT Vmax:         106.00 cm/s LVOT Vmean:        66.000 cm/s LVOT VTI:          0.162 m LVOT/AV VTI ratio: 0.67  AORTA Ao Root diam: 3.20 cm Ao Asc diam:  3.70 cm MITRAL VALVE MV Area (PHT): 4.10 cm    SHUNTS MV Decel Time: 185 msec    Systemic VTI:  0.16 m MV E velocity: 65.00 cm/s  Systemic Diam: 2.00 cm MV A velocity: 62.90 cm/s MV E/A ratio:  1.03 Fransico Him MD Electronically signed by Fransico Him MD Signature Date/Time: 05/05/2021/1:59:47 PM    Final     Assessment and Plan:   PERICARDIAL EFFUSION: The etiology of the pericardial effusion is not clear.  The duration of this is also not clear.  I do not see a recent TSH and will order this.  He has had no recent cardiac studies.  She does have a slightly low blood pressure and reports respiratory failure though its not clear that this is related to the effusion.  However, given the size and her current problems I think it is reasonable to consider pericardiocentesis.  I had a long discussion with her husband, her son and her at the bedside.  They would agree to proceed  electively.  ATRIAL FIB: Currently maintaining sinus rhythm.  I agree with continued IV amiodarone given the paroxysmal rapid A. fib.  No systemic heparin with the pericardial effusion and pending pericardiocentesis.   For questions or updates, please contact [place] Please consult [email or web address] for contact info under Cardiology/STEMI.   Signed, Minus Breeding, MD  05/05/2021 2:40 PM

## 2021-05-05 NOTE — Progress Notes (Signed)
Spokes with pt's sons and husband over the phone and updated about current status and treatment plan.  Chesley Mires, MD [place] Pager - [phone] 05/05/2021, 10:54 AM

## 2021-05-05 NOTE — Progress Notes (Signed)
[facility] Progress Note Patient Name: ZRIYAH KOPPLIN DOB: 03/26/1946 MRN: [id number]   Date of Service  05/05/2021  HPI/Events of Note  Hypophosphatemia - PO4--- = 1.4 and Creatinine = 1.69.  eICU Interventions  Will replace PO4---.     Intervention Category Major Interventions: Electrolyte abnormality - evaluation and management  Jacorion Klem [place] 05/05/2021, 6:09 AM

## 2021-05-05 NOTE — Procedures (Signed)
Routine EEG Report  Lisa Hamilton is a 75 y.o. female with a history of seizure who is undergoing an EEG to evaluate for seizures.  Report: This EEG was acquired with electrodes placed according to the International 10-20 electrode system (including Fp1, Fp2, F3, F4, C3, C4, P3, P4, O1, O2, T3, T4, T5, T6, A1, A2, Fz, Cz, Pz). The following electrodes were missing or displaced: none.  The occipital dominant rhythm was 8.5 Hz. This activity is reactive to stimulation. Drowsiness was manifested by background fragmentation; deeper stages of sleep were identified by K complexes and sleep spindles. There was no focal slowing. There were no interictal epileptiform discharges. There were no electrographic seizures identified. Photic stimulation and hyperventilation were not performed.  Impression: This EEG was obtained while awake and asleep and is normal.    Clinical Correlation: Normal EEGs, however, do not rule out epilepsy.  Su Monks, MD [facility] [phone]  If 7pm- 7am, please page neurology on call as listed in [place].

## 2021-05-05 DEATH — deceased

## 2021-05-06 ENCOUNTER — Inpatient Hospital Stay (HOSPITAL_COMMUNITY): Payer: Medicare Other

## 2021-05-06 DIAGNOSIS — J9622 Acute and chronic respiratory failure with hypercapnia: Secondary | ICD-10-CM | POA: Diagnosis not present

## 2021-05-06 DIAGNOSIS — J441 Chronic obstructive pulmonary disease with (acute) exacerbation: Secondary | ICD-10-CM

## 2021-05-06 DIAGNOSIS — N179 Acute kidney failure, unspecified: Secondary | ICD-10-CM | POA: Diagnosis not present

## 2021-05-06 DIAGNOSIS — J9621 Acute and chronic respiratory failure with hypoxia: Secondary | ICD-10-CM | POA: Diagnosis not present

## 2021-05-06 DIAGNOSIS — I3139 Other pericardial effusion (noninflammatory): Secondary | ICD-10-CM

## 2021-05-06 DIAGNOSIS — J189 Pneumonia, unspecified organism: Secondary | ICD-10-CM

## 2021-05-06 LAB — CBC
HCT: 38.1 % (ref 36.0–46.0)
Hemoglobin: 11.2 g/dL — ABNORMAL LOW (ref 12.0–15.0)
MCH: 29.6 pg (ref 26.0–34.0)
MCHC: 29.4 g/dL — ABNORMAL LOW (ref 30.0–36.0)
MCV: 100.5 fL — ABNORMAL HIGH (ref 80.0–100.0)
Platelets: 213 10*3/uL (ref 150–400)
RBC: 3.79 MIL/uL — ABNORMAL LOW (ref 3.87–5.11)
RDW: 14.7 % (ref 11.5–15.5)
WBC: 18.8 10*3/uL — ABNORMAL HIGH (ref 4.0–10.5)
nRBC: 0.1 % (ref 0.0–0.2)

## 2021-05-06 LAB — CULTURE, RESPIRATORY W GRAM STAIN: Culture: NORMAL

## 2021-05-06 LAB — PROTIME-INR
INR: 1.5 — ABNORMAL HIGH (ref 0.8–1.2)
Prothrombin Time: 17.7 seconds — ABNORMAL HIGH (ref 11.4–15.2)

## 2021-05-06 LAB — GLUCOSE, CAPILLARY
Glucose-Capillary: 129 mg/dL — ABNORMAL HIGH (ref 70–99)
Glucose-Capillary: 154 mg/dL — ABNORMAL HIGH (ref 70–99)
Glucose-Capillary: 164 mg/dL — ABNORMAL HIGH (ref 70–99)
Glucose-Capillary: 178 mg/dL — ABNORMAL HIGH (ref 70–99)
Glucose-Capillary: 181 mg/dL — ABNORMAL HIGH (ref 70–99)
Glucose-Capillary: 184 mg/dL — ABNORMAL HIGH (ref 70–99)

## 2021-05-06 LAB — BASIC METABOLIC PANEL
Anion gap: 10 (ref 5–15)
BUN: 72 mg/dL — ABNORMAL HIGH (ref 8–23)
CO2: 31 mmol/L (ref 22–32)
Calcium: 8.2 mg/dL — ABNORMAL LOW (ref 8.9–10.3)
Chloride: 102 mmol/L (ref 98–111)
Creatinine, Ser: 2.49 mg/dL — ABNORMAL HIGH (ref 0.44–1.00)
GFR, Estimated: 20 mL/min — ABNORMAL LOW (ref >60)
Glucose, Bld: 212 mg/dL — ABNORMAL HIGH (ref 70–99)
Potassium: 4.8 mmol/L (ref 3.5–5.1)
Sodium: 143 mmol/L (ref 135–145)

## 2021-05-06 LAB — PHOSPHORUS: Phosphorus: 3.5 mg/dL (ref 2.5–4.6)

## 2021-05-06 MED ORDER — SENNOSIDES-DOCUSATE SODIUM 8.6-50 MG PO TABS
2.0000 | ORAL_TABLET | Freq: Every day | ORAL | Status: DC
  Administered 2021-05-06 – 2021-05-07 (×3): 2
  Filled 2021-05-06 (×2): qty 2

## 2021-05-06 MED ORDER — BISACODYL 10 MG RE SUPP: 10.0000 mg | Freq: Every day | RECTAL | Status: DC | PRN

## 2021-05-06 MED ORDER — REVEFENACIN 175 MCG/3ML IN SOLN
175.0000 ug | Freq: Every day | RESPIRATORY_TRACT | Status: DC
  Administered 2021-05-07 – 2021-05-16 (×10): 175 ug via RESPIRATORY_TRACT
  Filled 2021-05-06 (×10): qty 3

## 2021-05-06 MED ORDER — FENTANYL 2500MCG IN NS 250ML (10MCG/ML) PREMIX INFUSION
0.0000 ug/h | INTRAVENOUS | Status: DC
  Administered 2021-05-06: 25 ug/h via INTRAVENOUS
  Administered 2021-05-07 – 2021-05-08 (×2): 150 ug/h via INTRAVENOUS
  Administered 2021-05-09: 275 ug/h via INTRAVENOUS
  Administered 2021-05-09 (×2): 225 ug/h via INTRAVENOUS
  Administered 2021-05-11: 250 ug/h via INTRAVENOUS
  Administered 2021-05-11: 200 ug/h via INTRAVENOUS
  Administered 2021-05-11: 250 ug/h via INTRAVENOUS
  Administered 2021-05-12 (×2): 200 ug/h via INTRAVENOUS
  Administered 2021-05-13: 150 ug/h via INTRAVENOUS
  Administered 2021-05-14 – 2021-05-15 (×3): 200 ug/h via INTRAVENOUS
  Administered 2021-05-15 – 2021-05-16 (×2): 100 ug/h via INTRAVENOUS
  Filled 2021-05-06 (×18): qty 250

## 2021-05-06 NOTE — Progress Notes (Signed)
Per Dr. Rosezella Florida request, I sent message to cardmaster and cath lab RN to review tomorrow AM whether pericardiocentesis can be scheduled - also requested they notify rounding team in AM to prioritize to finalize plans. Patient is already NPO at this time from previous order.

## 2021-05-06 NOTE — Progress Notes (Addendum)
[facility] Progress Note Patient Name: Lisa Hamilton DOB: 11-11-1945 MRN: [id number]   Date of Service  05/06/2021  HPI/Events of Note  Received request for fentanyl gtt.   Pt is intubated and uncomfortable with the ETT and needing prn pushes of fentanyl.   eICU Interventions  Start on fentanyl gtt.     Intervention Category Minor Interventions: Other:  Elsie Lincoln 05/06/2021, 9:29 PM  5:27 AM Notified of hyperkalemia with K 5.8 and worsening renal function.  Crea now at 3.10 <-- 2.49.  Plan> No medications that typically cause hyperkalemia.  Give insulin and D50 and lokelma.

## 2021-05-06 NOTE — Progress Notes (Signed)
NAME:  Lisa Hamilton, MRN:  [id number], DOB:  12/27/1945, LOS: 3 ADMISSION DATE:  04/21/2021, CONSULTATION DATE:  9/29 REFERRING MD:  Dr. Lorin Mercy, CHIEF COMPLAINT:  Cardiac arrest   History of Present Illness:  75 yr old female followed by Dr. Keturah Barre for COPD from asthma and chronic respiratory failure on 2 to 3 liters oxygen developed progressive cough, dyspnea, wheezing, and weakness.  Her husband reports she received COVID vaccine booster 2 days prior to developing symptoms.  She presented to ER on 9/28 and started on Bipap for acute on chronic hypoxic/hypercapnic respiratory failure.  She was started on steroids and antibiotics.  Had initial improvement and transitioned off Bipap per patient request.  Several hours later she then developed bradycardia leading to asystolic cardiac arrest.  Had ROSC after about 15 minutes.  Intubated and transferred to ICU.  Pertinent  Medical History  Anemia, Diverticulosis, DJD, GERD, Glaucoma, Hepatic cyst, Hiatal hernia, Colon polyps, HLD, HTN, Medullary sponge kidney, Goiter, Osteoporosis, SBO, Nephrolithiasis, RLS, Shingles, Vit D deficiency, prediabetes,  Significant Hospital Events: Including procedures, antibiotic start and stop dates in addition to other pertinent events   9/28 Admit 9/29 respiratory leading to cardiac arrest, intubated and transferred to ICU 9/30 tonic-clonic seizure and started on keppra, developed transient A fib and started on amiodarone, started on peripheral phenylephrine, Lt IJ CVL placed 10/1 back in sinus rhythm, off pressors, remains on increased PEEP/FiO2  Interim History / Subjective:  Patient resting in bed. Remains intubated but alert. States that she has some chest pain. She is still on amiodarone and remains in sinus rhythm. She continues to have elevated O2 requirements with elevated FiO2 and elevated driving pressures. Poor urine output overnight, but has good blood pressure on pressors this morning.  Objective    Blood pressure (!) 110/58, pulse 70, temperature 98.1 F (36.7 C), resp. rate (!) 21, height 5' (1.524 m), weight 108.2 kg, SpO2 94 %. CVP:  [13 mmHg-18 mmHg] 13 mmHg  Vent Mode: PRVC FiO2 (%):  [70 %-100 %] 100 % Set Rate:  [20 bmp] 20 bmp Vt Set:  [370 mL] 370 mL PEEP:  [8 cmH20] 8 cmH20 Plateau Pressure:  [23 cmH20-28 cmH20] 28 cmH20   Intake/Output Summary (Last 24 hours) at 05/06/2021 1517 Last data filed at 05/06/2021 0600 Gross per 24 hour  Intake 2320.21 ml  Output 85 ml  Net 2235.21 ml   Filed Weights   05/04/21 0545 05/05/21 0500 05/06/21 0500  Weight: 101.8 kg 105.8 kg 108.2 kg    Examination: General - alert and resting in bed with ET tube in place Eyes - pupils reactive ENT - ETT in place Cardiac - regular rate/rhythm, no murmur Chest - scattered rhonchi, no wheeze Abdomen - soft, non tender, + bowel sounds Extremities - 1+ edema, warm and clammy.  Skin - no rashes Neuro - RASS 0   Patient Lines/Drains/Airways Status     Active Line/Drains/Airways     Name Placement date Placement time Site Days   Peripheral IV 05/03/21 20 G Right Antecubital 05/03/21  1710  Antecubital  3   Peripheral IV 05/04/21 20 G 2.5" Anterior;Left;Proximal;Upper Arm 05/04/21  0415  Arm  2   CVC Triple Lumen 05/04/21 Left Internal jugular 05/04/21  1800  -- 2   NG/OG Vented/Dual Lumen Oral 05/03/21  1900  Oral  3   Urethral Catheter  Temperature probe 05/04/21  0230  Temperature probe  2   Airway 7.5 mm 05/03/21  2310  --  3   Incision (Closed) 10/15/17 Abdomen 10/15/17  1028  -- 1299   Incision (Closed) 12/04/17 Abdomen 12/04/17  1241  -- 1249   Incision - 5 Ports Abdomen 1: Umbilicus 2: Right 3: Left;Upper 4: Left;Mid 5: Left;Lower 12/04/17  1450  -- 1249   Wound / Incision (Open or Dehisced) 12/10/17 Incision - Open Abdomen Lower;Medial;Right JP drain site 12/10/17  2040  Abdomen  1243                  Resolved Hospital Problem list   Cardiogenic shock  Assessment &  Plan:   Acute on chronic hypoxic/hypercapnic respiratory failure in setting of asthma/COPD exacerbation with CAP. - not ready for vent weaning at this time - adjust PEEP/FiO2 to keep SpO2 > 92% - Opn day #4 of AB, currently on rocephin and zithromax - On day 4 of systemic steroids, solumedrol to 40 mg q12h - add yupelri, brovana and continue pulmicort, singulair - prn albuterol - CXR pending  Pericardial Effusion Respiratory leading to cardiac arrest. New onset atrial fibrillation with RVR >> back in sinus rhythm on 10/1. Hx of HTN, HLD. Patient had and elevated BNP on admission with normal EF and pericardial effusion on recent echo. She has low voltage EKG on telemetry and expresses some chest pain which could be due to her effusion versus underlying pericarditis. She remains in sinus rhythm on amiodarone without AC. She was given 60 mg of IV furosemide with minimal output and worsening kidney function. Cardiology was consulted and are considering pericardiocentesis as the patient has been experiencing so hypotension.   - continue amiodarone - continue ASA, pravachol - Continue to hold home cozaar  Oliguric AKI 2/2 ATN versus cardiorenal syndrome: Patient had 85 cc urine output in the last 24 hours. Cr elevated to 2.49 form a baseline of 0.97. GFR of 20. [place] ATN in the setting of cardiac arrest. Will need to monitor urine output and need for CRRT.  - Monitor urine output and kidney function daily - Has received IV lasix and therefore urine Na unreliable.  - Consider nephro consult.   Acute metabolic encephalopathy from hypoxia, hypercapnia. Isolated seizure after cardiac arrest on 9/30. Hx of RLS. - RASS goal 0 to -1 - CT head normal - f/u EEG - continue keppra for now - Neurology consulted - continue klonopin, neurontin, requip  Steroid induced hyperglycemia. - SSI  Hypokalemia. Hypophosphatemia. - f/u electrolytes  Hx of osteoporosis. - hold outpt fosamax  Best  Practice (right click and "Reselect all SmartList Selections" daily)   Diet/type: tubefeeds DVT prophylaxis: prophylactic heparin  GI prophylaxis: PPI Lines: N/A Foley:  Yes, and it is still needed Code Status:  full code Family: Updated pt's husband at bedside on 05/04/21.  Labs    CMP Latest Ref Rng & Units 05/06/2021 05/05/2021 05/04/2021  Glucose 70 - 99 mg/dL 212(H) 207(H) 163(H)  BUN 8 - 23 mg/dL 72(H) 54(H) 39(H)  Creatinine 0.44 - 1.00 mg/dL 2.49(H) 1.69(H) 0.97  Sodium 135 - 145 mmol/L 143 144 145  Potassium 3.5 - 5.1 mmol/L 4.8 3.8 4.7  Chloride 98 - 111 mmol/L 102 103 101  CO2 22 - 32 mmol/L 31 31 31   Calcium 8.9 - 10.3 mg/dL 8.2(L) 8.2(L) 9.0  Total Protein 6.5 - 8.1 g/dL - - -  Total Bilirubin 0.3 - 1.2 mg/dL - - -  Alkaline Phos 38 - 126 U/L - - -  AST 15 - 41 U/L - - -  ALT 0 -  44 U/L - - -    CBC Latest Ref Rng & Units 05/06/2021 05/05/2021 05/04/2021  WBC 4.0 - 10.5 K/uL 18.8(H) 15.0(H) 20.2(H)  Hemoglobin 12.0 - 15.0 g/dL 11.2(L) 11.4(L) 13.5  Hematocrit 36.0 - 46.0 % 38.1 38.8 47.9(H)  Platelets 150 - 400 K/uL 213 256 356    ABG    Component Value Date/Time   PHART 7.311 (L) 05/04/2021 0002   PCO2ART 72.7 (HH) 05/04/2021 0002   PO2ART 84 05/04/2021 0002   HCO3 36.7 (H) 05/04/2021 0002   TCO2 39 (H) 05/04/2021 0002   O2SAT 95.0 05/04/2021 0002    CBG (last 3)  Recent Labs    05/05/21 1928 05/05/21 2336 05/06/21 0319  GLUCAP 134* 125* 164*     Critical care time: [place], D.O.  Internal Medicine Resident, PGY-3 [facility]  Pager: [phone] 6:13 AM, 05/06/2021

## 2021-05-06 NOTE — Progress Notes (Signed)
Progress Note  Patient Name: Lisa Hamilton Date of Encounter: 05/06/2021  Primary Cardiologist:   None   Subjective   She is awake and alert on the vent and denies chest pain.    Inpatient Medications    Scheduled Meds:  arformoterol  15 mcg Nebulization BID   aspirin  81 mg Per Tube QHS   budesonide (PULMICORT) nebulizer solution  0.5 mg Nebulization BID   chlorhexidine gluconate (MEDLINE KIT)  15 mL Mouth Rinse BID   Chlorhexidine Gluconate Cloth  6 each Topical Daily   clonazePAM  1 mg Per Tube QHS   docusate  100 mg Per Tube BID   feeding supplement (PROSource TF)  45 mL Per Tube Daily   gabapentin  100 mg Per Tube Q8H   heparin  5,000 Units Subcutaneous Q8H   insulin aspart  0-20 Units Subcutaneous Q4H   levETIRAcetam  500 mg Per Tube BID   mouth rinse  15 mL Mouth Rinse 10 times per day   methylPREDNISolone (SOLU-MEDROL) injection  40 mg Intravenous Q12H   montelukast  10 mg Per Tube QPM   pantoprazole sodium  40 mg Per Tube Daily   polyethylene glycol  17 g Per Tube Daily   pravastatin  40 mg Per Tube Daily   revefenacin  175 mcg Nebulization Daily   rOPINIRole  1 mg Per Tube TID   sodium chloride flush  3 mL Intravenous Q12H   Continuous Infusions:  sodium chloride     amiodarone 30 mg/hr (05/06/21 0726)   azithromycin Stopped (05/05/21 1123)   cefTRIAXone (ROCEPHIN)  IV Stopped (05/05/21 1021)   feeding supplement (VITAL 1.5 CAL) 1,000 mL (05/05/21 1725)   PRN Meds: acetaminophen **OR** acetaminophen, albuterol, fentaNYL (SUBLIMAZE) injection, hydrALAZINE, midazolam, [DISCONTINUED] ondansetron **OR** ondansetron (ZOFRAN) IV   Vital Signs    Vitals:   05/06/21 0700 05/06/21 0728 05/06/21 0730 05/06/21 0731  BP: (!) 116/59  128/67   Pulse: 74  77   Resp: (!) 27  (!) 21   Temp: 98.1 F (36.7 C)  98.1 F (36.7 C)   TempSrc:      SpO2: 93% 91% 91% 91%  Weight:      Height:        Intake/Output Summary (Last 24 hours) at 05/06/2021 0802 Last data  filed at 05/06/2021 0730 Gross per 24 hour  Intake 2193.04 ml  Output 90 ml  Net 2103.04 ml   Filed Weights   05/04/21 0545 05/05/21 0500 05/06/21 0500  Weight: 101.8 kg 105.8 kg 108.2 kg    Telemetry    NSR, no further atrial fib - Personally Reviewed  ECG    NA - Personally Reviewed  Physical Exam   GEN: No acute distress.   Neck: No  JVD Cardiac: RRR, no murmurs, rubs, or gallops.  Respiratory:       Decreased breath sounds.   GI: Soft, nontender, non-distended  Neuro:  Nonfocal  Psych: Normal affect   Labs    Chemistry Recent Labs  Lab 04/14/2021 1847 05/03/21 0749 05/04/21 0300 05/05/21 0330 05/06/21 0435  NA 141   < > 145 144 143  K 4.0   < > 4.7 3.8 4.8  CL 99  --  101 103 102  CO2 35*  --  $R'31 31 31  'ib$ GLUCOSE 145*  --  163* 207* 212*  BUN 24*  --  39* 54* 72*  CREATININE 0.56  --  0.97 1.69* 2.49*  CALCIUM 9.4  --  9.0 8.2* 8.2*  PROT 6.8  --   --   --   --   ALBUMIN 3.3*  --   --   --   --   AST 20  --   --   --   --   ALT 19  --   --   --   --   ALKPHOS 105  --   --   --   --   BILITOT 1.3*  --   --   --   --   GFRNONAA >60  --  >60 32* 20*  ANIONGAP 7  --  $R'13 10 10   'Lv$ < > = values in this interval not displayed.     Hematology Recent Labs  Lab 05/04/21 0300 05/05/21 0330 05/06/21 0435  WBC 20.2* 15.0* 18.8*  RBC 4.60 3.89 3.79*  HGB 13.5 11.4* 11.2*  HCT 47.9* 38.8 38.1  MCV 104.1* 99.7 100.5*  MCH 29.3 29.3 29.6  MCHC 28.2* 29.4* 29.4*  RDW 13.6 14.4 14.7  PLT 356 256 213    Cardiac EnzymesNo results for input(s): TROPONINI in the last 168 hours. No results for input(s): TROPIPOC in the last 168 hours.   BNP Recent Labs  Lab 04/20/2021 1508 04/18/2021 1847  BNP  --  132.3*  PROBNP 333*  --      DDimer  Recent Labs  Lab 04/29/2021 1515  DDIMER 3.04*     Radiology    CT HEAD WO CONTRAST (5MM)  Result Date: 05/04/2021 CLINICAL DATA:  Seizure.  Weakness. EXAM: CT HEAD WITHOUT CONTRAST TECHNIQUE: Contiguous axial images were  obtained from the base of the skull through the vertex without intravenous contrast. COMPARISON:  None. FINDINGS: Brain: There is no evidence of an acute infarct, intracranial hemorrhage, mass, midline shift, or extra-axial fluid collection. The ventricles and sulci are normal. Vascular: Calcified atherosclerosis at the skull base. No hyperdense vessel. Skull: No fracture or suspicious osseous lesion. Sinuses/Orbits: Clear paranasal sinuses. Small left mastoid effusion. Bilateral cataract extraction. Other: Partially visualized endotracheal and enteric tubes. IMPRESSION: Unremarkable CT appearance of the brain. Electronically Signed   By: Logan Bores M.D.   On: 05/04/2021 12:51   DG Chest Port 1 View  Result Date: 05/05/2021 CLINICAL DATA:  Encounter for respiratory failure EXAM: PORTABLE CHEST 1 VIEW COMPARISON:  May 04, 2021 FINDINGS: The ETT is in good position. Left central line is stable. The NG tube terminates below today's film. No pneumothorax. Opacity diffusely throughout the right lung persists. There is effusion with underlying opacity in left base. The cardiomediastinal silhouette is stable. IMPRESSION: Diffuse opacity in the right lung and more focal opacity in left base. The opacities are similar in the interval and could represent multifocal pneumonia, pulmonary edema, or ARDS. Bilateral layering effusions. Support apparatus as above. Electronically Signed   By: Dorise Bullion III M.D.   On: 05/05/2021 06:11   DG CHEST PORT 1 VIEW  Result Date: 05/04/2021 CLINICAL DATA:  Central line placement. EXAM: PORTABLE CHEST 1 VIEW COMPARISON:  Chest x-ray from yesterday. FINDINGS: Unchanged endotracheal and enteric tubes. New left internal jugular central venous catheter with tip at the cavoatrial junction. Stable cardiomegaly. Worsening perihilar, right upper lobe, left lower lobe airspace disease with increasing small to moderate bilateral pleural effusions. No pneumothorax. No acute osseous  abnormality. IMPRESSION: 1. New left internal jugular central venous catheter with tip at the cavoatrial junction. No pneumothorax. 2. Worsening airspace disease in both lungs which could reflect multifocal pneumonia,  pulmonary edema, or ARDS. Increasing small to moderate bilateral pleural effusions. Electronically Signed   By: Titus Dubin M.D.   On: 05/04/2021 18:42   EEG adult  Result Date: 05/05/2021 Derek Jack, MD     05/05/2021  9:45 PM Routine EEG Report Lisa Hamilton is a 75 y.o. female with a history of seizure who is undergoing an EEG to evaluate for seizures. Report: This EEG was acquired with electrodes placed according to the International 10-20 electrode system (including Fp1, Fp2, F3, F4, C3, C4, P3, P4, O1, O2, T3, T4, T5, T6, A1, A2, Fz, Cz, Pz). The following electrodes were missing or displaced: none. The occipital dominant rhythm was 8.5 Hz. This activity is reactive to stimulation. Drowsiness was manifested by background fragmentation; deeper stages of sleep were identified by K complexes and sleep spindles. There was no focal slowing. There were no interictal epileptiform discharges. There were no electrographic seizures identified. Photic stimulation and hyperventilation were not performed. Impression: This EEG was obtained while awake and asleep and is normal.   Clinical Correlation: Normal EEGs, however, do not rule out epilepsy. Su Monks, MD [facility] [phone] If 7pm- 7am, please page neurology on call as listed in [place].   ECHOCARDIOGRAM COMPLETE  Result Date: 05/05/2021    ECHOCARDIOGRAM REPORT   Patient Name:   Lisa Hamilton Date of Exam: 05/05/2021 Medical Rec #:  [id number]    Height:       60.0 in Accession #:    [id number]   Weight:       233.2 lb Date of Birth:  [birth year]   BSA:          1.993 m Patient Age:    74 years     BP:           119/67 mmHg Patient Gender: F            HR:           94 bpm. Exam Location:  Inpatient Procedure: 2D Echo,  Cardiac Doppler and Color Doppler Indications:    Cardiac arrest  History:        Patient has prior history of Echocardiogram examinations, most                 recent 06/15/2013. COPD; Signs/Symptoms:Dyspnea.  Sonographer:    Clayton Lefort RDCS (AE) Referring Phys: [place] LEWIS  Sonographer Comments: Echo performed with patient supine and on artificial respirator. IMPRESSIONS  1. Left ventricular ejection fraction, by estimation, is 60 to 65%. The left ventricle has normal function. The left ventricle has no regional wall motion abnormalities. Left ventricular diastolic parameters are indeterminate.  2. Right ventricular systolic function is normal. The right ventricular size is normal. Tricuspid regurgitation signal is inadequate for assessing PA pressure.  3. The mitral valve is normal in structure. No evidence of mitral valve regurgitation. No evidence of mitral stenosis.  4. The aortic valve is tricuspid. Aortic valve regurgitation is not visualized. Mild aortic valve sclerosis is present, with no evidence of aortic valve stenosis.  5. The inferior vena cava is normal in size with greater than 50% respiratory variability, suggesting right atrial pressure of 3 mmHg.  6. Large pericardial effusion. The pericardial effusion is circumferential. The effusion measures 2.42cm at greatest diameter posteriorly. There is RA inversion but no RV diastolic collapse and the IVC is normal size and collapses > 50% with expiration.  There is normal MV inflow pattern with respirations. No findings of  impending tamponade but this is a clinical diagnosis made at the bedside. Marland Kitchen FINDINGS  Left Ventricle: Left ventricular ejection fraction, by estimation, is 60 to 65%. The left ventricle has normal function. The left ventricle has no regional wall motion abnormalities. The left ventricular internal cavity size was normal in size. There is  no left ventricular hypertrophy. Left ventricular diastolic parameters are  indeterminate. Normal left ventricular filling pressure. Right Ventricle: The right ventricular size is normal. No increase in right ventricular wall thickness. Right ventricular systolic function is normal. Tricuspid regurgitation signal is inadequate for assessing PA pressure. Left Atrium: Left atrial size was normal in size. Right Atrium: Right atrial size was normal in size. Pericardium: The effusion measures 2.42 at greatest diameter posteriorly. A large pericardial effusion is present. The pericardial effusion is circumferential. There is diastolic collapse of the right atrial wall. Mitral Valve: The mitral valve is normal in structure. No evidence of mitral valve regurgitation. No evidence of mitral valve stenosis. Tricuspid Valve: The tricuspid valve is normal in structure. Tricuspid valve regurgitation is not demonstrated. No evidence of tricuspid stenosis. Aortic Valve: The aortic valve is tricuspid. Aortic valve regurgitation is not visualized. Mild aortic valve sclerosis is present, with no evidence of aortic valve stenosis. Aortic valve mean gradient measures 5.0 mmHg. Aortic valve peak gradient measures 7.8 mmHg. Aortic valve area, by VTI measures 2.11 cm. Pulmonic Valve: The pulmonic valve was normal in structure. Pulmonic valve regurgitation is trivial. No evidence of pulmonic stenosis. Aorta: The aortic root is normal in size and structure. Venous: The inferior vena cava is normal in size with greater than 50% respiratory variability, suggesting right atrial pressure of 3 mmHg. IAS/Shunts: No atrial level shunt detected by color flow Doppler.  LEFT VENTRICLE PLAX 2D LVIDd:         5.00 cm  Diastology LVIDs:         3.10 cm  LV e' medial:    6.09 cm/s LV PW:         0.80 cm  LV E/e' medial:  10.7 LV IVS:        1.00 cm  LV e' lateral:   8.70 cm/s LVOT diam:     2.00 cm  LV E/e' lateral: 7.5 LV SV:         51 LV SV Index:   26 LVOT Area:     3.14 cm  RIGHT VENTRICLE             IVC RV Basal diam:   3.10 cm     IVC diam: 1.30 cm RV S prime:     12.10 cm/s TAPSE (M-mode): 1.9 cm LEFT ATRIUM             Index       RIGHT ATRIUM           Index LA diam:        2.10 cm 1.05 cm/m  RA Area:     14.20 cm LA Vol (A2C):   59.9 ml 30.06 ml/m RA Volume:   33.10 ml  16.61 ml/m LA Vol (A4C):   59.4 ml 29.81 ml/m LA Biplane Vol: 61.5 ml 30.86 ml/m  AORTIC VALVE AV Area (Vmax):    2.38 cm AV Area (Vmean):   2.08 cm AV Area (VTI):     2.11 cm AV Vmax:           140.00 cm/s AV Vmean:          99.600 cm/s AV  VTI:            0.241 m AV Peak Grad:      7.8 mmHg AV Mean Grad:      5.0 mmHg LVOT Vmax:         106.00 cm/s LVOT Vmean:        66.000 cm/s LVOT VTI:          0.162 m LVOT/AV VTI ratio: 0.67  AORTA Ao Root diam: 3.20 cm Ao Asc diam:  3.70 cm MITRAL VALVE MV Area (PHT): 4.10 cm    SHUNTS MV Decel Time: 185 msec    Systemic VTI:  0.16 m MV E velocity: 65.00 cm/s  Systemic Diam: 2.00 cm MV A velocity: 62.90 cm/s MV E/A ratio:  1.03 Fransico Him MD Electronically signed by Fransico Him MD Signature Date/Time: 05/05/2021/1:59:47 PM    Final     Cardiac Studies   ECHOCARDIOGRAM:     1. Left ventricular ejection fraction, by estimation, is 60 to 65%. The  left ventricle has normal function. The left ventricle has no regional  wall motion abnormalities. Left ventricular diastolic parameters are  indeterminate.   2. Right ventricular systolic function is normal. The right ventricular  size is normal. Tricuspid regurgitation signal is inadequate for assessing  PA pressure.   3. The mitral valve is normal in structure. No evidence of mitral valve  regurgitation. No evidence of mitral stenosis.   4. The aortic valve is tricuspid. Aortic valve regurgitation is not  visualized. Mild aortic valve sclerosis is present, with no evidence of  aortic valve stenosis.   5. The inferior vena cava is normal in size with greater than 50%  respiratory variability, suggesting right atrial pressure of 3 mmHg.   6. Large  pericardial effusion. The pericardial effusion is  circumferential. The effusion measures 2.42cm at greatest diameter  posteriorly. There is RA inversion but no RV diastolic collapse and the  IVC is normal size and collapses > 50% with expiration.   There is normal MV inflow pattern with respirations. No findings of  impending tamponade but this is a clinical diagnosis made at the bedside.  .   Patient Profile     75 y.o. female no prior cardiac history. She presented with acute respiratory failure.  She has chronic oxygen dependent COPD and asthma.  She has been treated with steroids and antibiotics.  She was initially treated with BiPAP.  During this hospitalization she had bradycardia and asystolic arrest.  She was intubated and transferred to the ICU.  Resuscitation apparently was 15 minutes.  This was on 9/29.  She was noted to have seizures on 930.  She had transient atrial fibrillation. We were called because of pericardial effusion on echo.   Assessment & Plan    Pericardial effusion:  Would consider pericardiocentesis early possibly on Monday.  We will check the board tomorrow morning to consider timing.  Rounding team will need orders and consent if we are able to schedule for tomorrow.  If not it will be Tuesday.   Atrial fib:  No further arrhythmia.  Keep on amiodarone until we have completed the pericardiocentesis.     Respiratory failure:    Intubated and on steroids and antibiotics.    AKI:   Creat is up to 2.49.  Oliguric.  Likely ATN with resuscitation.  Avoiding nephrotoxic drugs.  CXR with diffuse ASD.  Might need CRRT.     For questions or updates, please contact [place] Please consult  [email or web address] for contact info under Cardiology/STEMI.   Signed, Minus Breeding, MD  05/06/2021, 8:02 AM

## 2021-05-07 ENCOUNTER — Inpatient Hospital Stay (HOSPITAL_COMMUNITY): Payer: Medicare Other

## 2021-05-07 ENCOUNTER — Encounter (HOSPITAL_COMMUNITY): Admission: EM | Disposition: E | Payer: Self-pay | Source: Home / Self Care | Attending: Pulmonary Disease

## 2021-05-07 DIAGNOSIS — I480 Paroxysmal atrial fibrillation: Secondary | ICD-10-CM

## 2021-05-07 DIAGNOSIS — R042 Hemoptysis: Secondary | ICD-10-CM

## 2021-05-07 DIAGNOSIS — I10 Essential (primary) hypertension: Secondary | ICD-10-CM

## 2021-05-07 DIAGNOSIS — I3139 Other pericardial effusion (noninflammatory): Secondary | ICD-10-CM

## 2021-05-07 DIAGNOSIS — I469 Cardiac arrest, cause unspecified: Secondary | ICD-10-CM | POA: Diagnosis not present

## 2021-05-07 DIAGNOSIS — J9621 Acute and chronic respiratory failure with hypoxia: Secondary | ICD-10-CM | POA: Diagnosis not present

## 2021-05-07 LAB — URINALYSIS, ROUTINE W REFLEX MICROSCOPIC
Bilirubin Urine: NEGATIVE
Glucose, UA: 50 mg/dL — AB
Ketones, ur: NEGATIVE mg/dL
Nitrite: NEGATIVE
Protein, ur: 100 mg/dL — AB
Specific Gravity, Urine: 1.019 (ref 1.005–1.030)
WBC, UA: >50 WBC/hpf — ABNORMAL HIGH (ref 0–5)
pH: 5 (ref 5.0–8.0)

## 2021-05-07 LAB — POTASSIUM
Potassium: 5.6 mmol/L — ABNORMAL HIGH (ref 3.5–5.1)
Potassium: 5.9 mmol/L — ABNORMAL HIGH (ref 3.5–5.1)
Potassium: 6 mmol/L — ABNORMAL HIGH (ref 3.5–5.1)

## 2021-05-07 LAB — COMPREHENSIVE METABOLIC PANEL
ALT: 22 U/L (ref 0–44)
AST: 26 U/L (ref 15–41)
Albumin: 2.1 g/dL — ABNORMAL LOW (ref 3.5–5.0)
Alkaline Phosphatase: 95 U/L (ref 38–126)
Anion gap: 8 (ref 5–15)
BUN: 90 mg/dL — ABNORMAL HIGH (ref 8–23)
CO2: 32 mmol/L (ref 22–32)
Calcium: 7.9 mg/dL — ABNORMAL LOW (ref 8.9–10.3)
Chloride: 101 mmol/L (ref 98–111)
Creatinine, Ser: 3.1 mg/dL — ABNORMAL HIGH (ref 0.44–1.00)
GFR, Estimated: 15 mL/min — ABNORMAL LOW (ref >60)
Glucose, Bld: 151 mg/dL — ABNORMAL HIGH (ref 70–99)
Potassium: 5.8 mmol/L — ABNORMAL HIGH (ref 3.5–5.1)
Sodium: 141 mmol/L (ref 135–145)
Total Bilirubin: 0.7 mg/dL (ref 0.3–1.2)
Total Protein: 5 g/dL — ABNORMAL LOW (ref 6.5–8.1)

## 2021-05-07 LAB — CBC
HCT: 37.5 % (ref 36.0–46.0)
Hemoglobin: 11 g/dL — ABNORMAL LOW (ref 12.0–15.0)
MCH: 29.4 pg (ref 26.0–34.0)
MCHC: 29.3 g/dL — ABNORMAL LOW (ref 30.0–36.0)
MCV: 100.3 fL — ABNORMAL HIGH (ref 80.0–100.0)
Platelets: 190 10*3/uL (ref 150–400)
RBC: 3.74 MIL/uL — ABNORMAL LOW (ref 3.87–5.11)
RDW: 14.9 % (ref 11.5–15.5)
WBC: 17.2 10*3/uL — ABNORMAL HIGH (ref 4.0–10.5)
nRBC: 0.2 % (ref 0.0–0.2)

## 2021-05-07 LAB — RESPIRATORY PANEL BY PCR

## 2021-05-07 LAB — BASIC METABOLIC PANEL
Anion gap: 10 (ref 5–15)
Anion gap: 10 (ref 5–15)
BUN: 91 mg/dL — ABNORMAL HIGH (ref 8–23)
BUN: 78 mg/dL — ABNORMAL HIGH (ref 8–23)
CO2: 31 mmol/L (ref 22–32)
CO2: 29 mmol/L (ref 22–32)
Calcium: 8 mg/dL — ABNORMAL LOW (ref 8.9–10.3)
Calcium: 7.7 mg/dL — ABNORMAL LOW (ref 8.9–10.3)
Chloride: 101 mmol/L (ref 98–111)
Chloride: 100 mmol/L (ref 98–111)
Creatinine, Ser: 3.15 mg/dL — ABNORMAL HIGH (ref 0.44–1.00)
Creatinine, Ser: 2.78 mg/dL — ABNORMAL HIGH (ref 0.44–1.00)
GFR, Estimated: 15 mL/min — ABNORMAL LOW (ref >60)
GFR, Estimated: 17 mL/min — ABNORMAL LOW (ref >60)
Glucose, Bld: 142 mg/dL — ABNORMAL HIGH (ref 70–99)
Glucose, Bld: 131 mg/dL — ABNORMAL HIGH (ref 70–99)
Potassium: 5.9 mmol/L — ABNORMAL HIGH (ref 3.5–5.1)
Potassium: 5.6 mmol/L — ABNORMAL HIGH (ref 3.5–5.1)
Sodium: 142 mmol/L (ref 135–145)
Sodium: 139 mmol/L (ref 135–145)

## 2021-05-07 LAB — GLUCOSE, CAPILLARY
Glucose-Capillary: 134 mg/dL — ABNORMAL HIGH (ref 70–99)
Glucose-Capillary: 125 mg/dL — ABNORMAL HIGH (ref 70–99)
Glucose-Capillary: 101 mg/dL — ABNORMAL HIGH (ref 70–99)
Glucose-Capillary: 129 mg/dL — ABNORMAL HIGH (ref 70–99)
Glucose-Capillary: 111 mg/dL — ABNORMAL HIGH (ref 70–99)

## 2021-05-07 LAB — RENAL FUNCTION PANEL
Albumin: 2.2 g/dL — ABNORMAL LOW (ref 3.5–5.0)
Anion gap: 11 (ref 5–15)
BUN: 106 mg/dL — ABNORMAL HIGH (ref 8–23)
CO2: 31 mmol/L (ref 22–32)
Calcium: 7.8 mg/dL — ABNORMAL LOW (ref 8.9–10.3)
Chloride: 98 mmol/L (ref 98–111)
Creatinine, Ser: 3.27 mg/dL — ABNORMAL HIGH (ref 0.44–1.00)
GFR, Estimated: 14 mL/min — ABNORMAL LOW (ref >60)
Glucose, Bld: 136 mg/dL — ABNORMAL HIGH (ref 70–99)
Phosphorus: 4.4 mg/dL (ref 2.5–4.6)
Potassium: 5.9 mmol/L — ABNORMAL HIGH (ref 3.5–5.1)
Sodium: 140 mmol/L (ref 135–145)

## 2021-05-07 LAB — VANCOMYCIN, RANDOM: Vancomycin Rm: 6

## 2021-05-07 LAB — MAGNESIUM: Magnesium: 2.4 mg/dL (ref 1.7–2.4)

## 2021-05-07 LAB — BODY FLUID CELL COUNT WITH DIFFERENTIAL
Eos, Fluid: 2 %
Lymphs, Fluid: 16 %
Monocyte-Macrophage-Serous Fluid: 10 % — ABNORMAL LOW (ref 50–90)
Neutrophil Count, Fluid: 72 % — ABNORMAL HIGH (ref 0–25)
Total Nucleated Cell Count, Fluid: 38 cu mm (ref 0–1000)

## 2021-05-07 LAB — ECHOCARDIOGRAM LIMITED
Height: 60 in
Weight: 3887.15 oz

## 2021-05-07 LAB — SODIUM, URINE, RANDOM: Sodium, Ur: 29 mmol/L

## 2021-05-07 LAB — CREATININE, URINE, RANDOM: Creatinine, Urine: 89.52 mg/dL

## 2021-05-07 SURGERY — PERICARDIOCENTESIS
Anesthesia: LOCAL

## 2021-05-07 MED ORDER — ROCURONIUM BROMIDE 10 MG/ML (PF) SYRINGE
PREFILLED_SYRINGE | INTRAVENOUS | Status: AC
  Administered 2021-05-07: 80 mg via INTRAVENOUS
  Filled 2021-05-07: qty 10

## 2021-05-07 MED ORDER — VANCOMYCIN VARIABLE DOSE PER UNSTABLE RENAL FUNCTION (PHARMACIST DOSING): Status: DC

## 2021-05-07 MED ORDER — NOREPINEPHRINE 4 MG/250ML-% IV SOLN
0.0000 ug/min | INTRAVENOUS | Status: DC
  Administered 2021-05-07: 5 ug/min via INTRAVENOUS
  Administered 2021-05-08: 8 ug/min via INTRAVENOUS
  Filled 2021-05-07 (×2): qty 250

## 2021-05-07 MED ORDER — SODIUM CHLORIDE 0.9 % FOR CRRT: INTRAVENOUS_CENTRAL | Status: DC | PRN

## 2021-05-07 MED ORDER — PREDNISONE 20 MG PO TABS
40.0000 mg | ORAL_TABLET | Freq: Every day | ORAL | Status: AC
  Administered 2021-05-08 – 2021-05-12 (×5): 40 mg
  Filled 2021-05-07 (×6): qty 2

## 2021-05-07 MED ORDER — AMIODARONE HCL 200 MG PO TABS
200.0000 mg | ORAL_TABLET | Freq: Two times a day (BID) | ORAL | Status: DC
  Administered 2021-05-07 (×2): 200 mg
  Filled 2021-05-07 (×3): qty 1

## 2021-05-07 MED ORDER — DEXTROSE 50 % IV SOLN
1.0000 | Freq: Once | INTRAVENOUS | Status: AC
  Administered 2021-05-07: 50 mL via INTRAVENOUS
  Filled 2021-05-07: qty 50

## 2021-05-07 MED ORDER — CEFEPIME HCL 2 G IJ SOLR
2.0000 g | Freq: Two times a day (BID) | INTRAMUSCULAR | Status: AC
Start: 2021-05-07 — End: 2021-05-11
  Administered 2021-05-07 – 2021-05-11 (×9): 2 g via INTRAVENOUS
  Filled 2021-05-07 (×9): qty 2

## 2021-05-07 MED ORDER — B COMPLEX-C PO TABS
1.0000 | ORAL_TABLET | Freq: Every day | ORAL | Status: DC
  Administered 2021-05-07 – 2021-05-15 (×9): 1
  Filled 2021-05-07 (×9): qty 1

## 2021-05-07 MED ORDER — TRANEXAMIC ACID FOR INHALATION
500.0000 mg | Freq: Three times a day (TID) | RESPIRATORY_TRACT | Status: AC
  Administered 2021-05-07 – 2021-05-09 (×6): 500 mg via RESPIRATORY_TRACT
  Filled 2021-05-07 (×7): qty 10

## 2021-05-07 MED ORDER — SODIUM ZIRCONIUM CYCLOSILICATE 10 G PO PACK
10.0000 g | PACK | Freq: Once | ORAL | Status: AC
  Administered 2021-05-07: 10 g
  Filled 2021-05-07: qty 1

## 2021-05-07 MED ORDER — MIDAZOLAM-SODIUM CHLORIDE 100-0.9 MG/100ML-% IV SOLN
0.5000 mg/h | INTRAVENOUS | Status: DC
  Administered 2021-05-07: 1 mg/h via INTRAVENOUS
  Administered 2021-05-09 – 2021-05-10 (×2): 4 mg/h via INTRAVENOUS
  Filled 2021-05-07 (×3): qty 100

## 2021-05-07 MED ORDER — PRISMASOL BGK 0/2.5 32-2.5 MEQ/L EC SOLN
Status: DC
  Filled 2021-05-07 (×7): qty 5000

## 2021-05-07 MED ORDER — ETOMIDATE 2 MG/ML IV SOLN
INTRAVENOUS | Status: AC
  Filled 2021-05-07: qty 20

## 2021-05-07 MED ORDER — PROPOFOL 1000 MG/100ML IV EMUL
INTRAVENOUS | Status: AC
  Filled 2021-05-07: qty 100

## 2021-05-07 MED ORDER — FENTANYL CITRATE (PF) 100 MCG/2ML IJ SOLN
25.0000 ug | INTRAMUSCULAR | Status: AC | PRN
  Administered 2021-05-07 – 2021-05-14 (×2): 25 ug via INTRAVENOUS

## 2021-05-07 MED ORDER — ROCURONIUM BROMIDE 10 MG/ML (PF) SYRINGE: 80.0000 mg | PREFILLED_SYRINGE | INTRAVENOUS | Status: AC

## 2021-05-07 MED ORDER — PRISMASOL BGK 4/2.5 32-4-2.5 MEQ/L REPLACEMENT SOLN
Status: DC
  Filled 2021-05-07 (×6): qty 5000

## 2021-05-07 MED ORDER — MIDAZOLAM HCL 2 MG/2ML IJ SOLN
INTRAMUSCULAR | Status: AC
  Filled 2021-05-07: qty 2

## 2021-05-07 MED ORDER — ATROPINE SULFATE 1 MG/10ML IJ SOSY
PREFILLED_SYRINGE | INTRAMUSCULAR | Status: AC
  Filled 2021-05-07: qty 10

## 2021-05-07 MED ORDER — PRISMASOL BGK 4/2.5 32-4-2.5 MEQ/L EC SOLN
Status: DC
  Filled 2021-05-07 (×11): qty 5000

## 2021-05-07 MED ORDER — ETOMIDATE 2 MG/ML IV SOLN
20.0000 mg | Freq: Once | INTRAVENOUS | Status: AC
  Administered 2021-05-07: 20 mg via INTRAVENOUS

## 2021-05-07 MED ORDER — SODIUM CHLORIDE 0.9 % IV SOLN
20.0000 ug | Freq: Once | INTRAVENOUS | Status: AC
  Administered 2021-05-07: 20 ug via INTRAVENOUS
  Filled 2021-05-07: qty 5

## 2021-05-07 MED ORDER — SUCCINYLCHOLINE CHLORIDE 200 MG/10ML IV SOSY
PREFILLED_SYRINGE | INTRAVENOUS | Status: AC
  Filled 2021-05-07: qty 10

## 2021-05-07 MED ORDER — HEPARIN SODIUM (PORCINE) 1000 UNIT/ML DIALYSIS
1000.0000 [IU] | INTRAMUSCULAR | Status: DC | PRN
  Administered 2021-05-08: 2800 [IU] via INTRAVENOUS_CENTRAL
  Filled 2021-05-07 (×3): qty 6
  Filled 2021-05-07: qty 3
  Filled 2021-05-07 (×2): qty 5

## 2021-05-07 MED ORDER — FENTANYL CITRATE (PF) 100 MCG/2ML IJ SOLN
INTRAMUSCULAR | Status: AC
  Filled 2021-05-07: qty 2

## 2021-05-07 MED ORDER — INSULIN ASPART 100 UNIT/ML IV SOLN
10.0000 [IU] | Freq: Once | INTRAVENOUS | Status: AC
  Administered 2021-05-07: 10 [IU] via INTRAVENOUS

## 2021-05-07 MED ORDER — ROCURONIUM BROMIDE 50 MG/5ML IV SOLN: 80.0000 mg/kg | INTRAVENOUS | Status: DC

## 2021-05-07 MED ORDER — PROPOFOL 1000 MG/100ML IV EMUL
5.0000 ug/kg/min | INTRAVENOUS | Status: DC
  Administered 2021-05-07: 20 ug/kg/min via INTRAVENOUS

## 2021-05-07 MED ORDER — PROSOURCE TF PO LIQD
45.0000 mL | Freq: Two times a day (BID) | ORAL | Status: DC
  Administered 2021-05-07 – 2021-05-15 (×17): 45 mL
  Filled 2021-05-07 (×18): qty 45

## 2021-05-07 MED ORDER — PHENYLEPHRINE 40 MCG/ML (10ML) SYRINGE FOR IV PUSH (FOR BLOOD PRESSURE SUPPORT)
PREFILLED_SYRINGE | INTRAVENOUS | Status: AC
  Administered 2021-05-07: 400 ug
  Filled 2021-05-07: qty 10

## 2021-05-07 MED ORDER — ATROPINE SULFATE 1 MG/10ML IJ SOSY: 0.5000 mg | PREFILLED_SYRINGE | INTRAMUSCULAR | Status: DC | PRN

## 2021-05-07 NOTE — Procedures (Signed)
Intubation Procedure Note  Lisa Hamilton  [id number]  03-Aug-1946  Date:05/08/2021  Time:5:28 PM   Provider Performing:Treasure Ingrum C Tamala Julian    Procedure: Intubation (76283)  Indication(s) Respiratory Failure  Consent Unable to obtain consent due to emergent nature of procedure.   Anesthesia Etomidate and Rocuronium   Time Out Verified patient identification, verified procedure, site/side was marked, verified correct patient position, special equipment/implants available, medications/allergies/relevant history reviewed, required imaging and test results available.   Sterile Technique Usual hand hygeine, masks, and gloves were used   Procedure Description Kept upright due to high O2 needs; placed bougee down existing 7-5 ETT and removed, placed new 8-5 ETT, near immediate improvement noted in sats.  Dr. Valeta Harms proceeded with diagnostic bronchoscopy after   Complications/Tolerance None; patient tolerated the procedure well. Chest X-ray is ordered to verify placement.   EBL Minimal   Specimen(s) None

## 2021-05-07 NOTE — Progress Notes (Addendum)
Interim Note Called to bedside for worsening sats, difficulty bagging. Emergent bedside bronch showing LLL occlusion with clot, diffuse bronchial bleeding unable to suction effectively with existing tube.  Exchanged ETT out (see separate note), Dr. Valeta Harms came and evaluated airways, serial lavage of RUL showing alveolitis/DAH.  Will stop heparin, give TXA nebs  + DDAVP to try to stem bleeding. Volume removal will help as well.  Needs heavier sedation, will unfortunately have to use versed gtt given her issues with bradycardia w/ prop/precedex.  Noncon CT chest in AM if stable.  Keep more heavily sedated tonight  Will update family  Additional 45 minutes cc time independent of procedures managing above  Erskine Emery MD PCCM

## 2021-05-07 NOTE — Procedures (Signed)
Central Venous Catheter Insertion Procedure Note  Lisa Hamilton  [id number]  02-06-1946  Date:05/15/2021  Time:12:03 PM   Provider Performing:Deagen Krass Marianna Payment   Procedure: Insertion of Non-tunneled Central Venous Catheter(36556)with US guidance (03212)    Indication(s) Hemodialysis  Consent Risks of the procedure as well as the alternatives and risks of each were explained to the patient and/or caregiver.  Consent for the procedure was obtained and is signed in the bedside chart  Anesthesia Topical only with 1% lidocaine   Timeout Verified patient identification, verified procedure, site/side was marked, verified correct patient position, special equipment/implants available, medications/allergies/relevant history reviewed, required imaging and test results available.  Sterile Technique Maximal sterile technique including full sterile barrier drape, hand hygiene, sterile gown, sterile gloves, mask, hair covering, sterile ultrasound probe cover (if used).  Procedure Description Area of catheter insertion was cleaned with chlorhexidine and draped in sterile fashion.   With real-time ultrasound guidance a HD catheter was placed into the right internal jugular vein.  Nonpulsatile blood flow and easy flushing noted in all ports.  The catheter was sutured in place and sterile dressing applied.  Complications/Tolerance None; patient tolerated the procedure well. Chest X-ray is ordered to verify placement for internal jugular or subclavian cannulation.  Chest x-ray is not ordered for femoral cannulation.  EBL Minimal  Specimen(s) None  Lawerance Cruel, D.O.  Internal Medicine Resident, PGY-3 [facility]  Pager: [phone] 12:05 PM, 05/24/2021   **Please contact the on call pager after 5 pm and on weekends at [phone].**

## 2021-05-07 NOTE — Progress Notes (Addendum)
eLink Physician-Brief Progress Note Patient Name: Lisa Hamilton DOB: February 16, 1946 MRN: [id number]   Date of Service  05/20/2021  HPI/Events of Note  Notified of hypotension while on CRRT.  BP 83/46, HR 59.  eICU Interventions  Start on levophed through central line.     Intervention Category Intermediate Interventions: Hypotension - evaluation and management  Elsie Lincoln 05/23/2021, 9:48 PM  10:19 PM Notified of bradycardia with rate dropping into the 30s.  SBP in the 120s.  Levophed just started.    K also at 6 on CRRT.  Plan> Check BMP and Mg.  Continue levophed.  Atropine prn for HR <40.

## 2021-05-07 NOTE — Progress Notes (Signed)
Nutrition Follow-up  DOCUMENTATION CODES:   Not applicable  INTERVENTION:   Tube feeding via OG tube: - Vital 1.5 @ 50 ml/hr (1200 ml/day) - ProSource TF 45 ml BID   Tube feeding regimen provides 1880 kcal, 103 grams of protein, and 917 ml of H2O.   - B-complex with vitamin C to account for losses with CRRT  NUTRITION DIAGNOSIS:   Inadequate oral intake related to inability to eat as evidenced by NPO status.  Ongoing, being addressed via TF  GOAL:   Patient will meet greater than or equal to 90% of their needs  Met via TF  MONITOR:   Vent status, Labs, Weight trends, TF tolerance, I & O's  REASON FOR ASSESSMENT:   Ventilator, Consult Enteral/tube feeding initiation and management  ASSESSMENT:   74 year old female who presented to the ED on 9/28 with SOB. PMH of COPD, asthma, osteoporosis, HTN, HLD, GERD, anemia, diverticulosis, hiatal hernia, vitamin D deficiency. Pt admitted with acute on chronic respiratory failure in setting of asthma/COPD exacerbation.  9/29 - cardiac arrest, intubated 9/30 - seizure   Discussed pt with RN and during ICU rounds. Pt with severe AKI and is oliguric, starting on CRRT today. Per Cardiology, no plan for pericardiocentesis at this time. Tube feeds were held at midnight pending pericardiocentesis. Per discussion with RN, plan is to restart them today now that procedure has been cancelled.  Admit weight: 99.8 kg Current weight: 110.2 kg  Suspect weight gain related to positive fluid balance as pt is +7.7 L since admission. Pt with mild to moderate pitting edema.  Patient remains intubated on ventilator support MV: 7.5 L/min Temp (24hrs), Avg:97.7 F (36.5 C), Min:97 F (36.1 C), Max:98.2 F (36.8 C)  Drips: Propofol: currently off Fentanyl  Medications reviewed and include: SSI q 4 hours, protonix, miralax, prednisone, senna, IV abx  Labs reviewed: potassium 5.6, BUN 91, creatinine 3.15 CBG's: 101-178 x 24 hours  UOP:  200 ml x 24 hours I/O's: +7.7 L since admit  Diet Order:   Diet Order             Diet NPO time specified  Diet effective now                   EDUCATION NEEDS:   No education needs have been identified at this time  Skin:  Skin Assessment: Reviewed RN Assessment  Last BM:  no documented BM  Height:   Ht Readings from Last 1 Encounters:  05/03/21 5' (1.524 m)    Weight:   Wt Readings from Last 1 Encounters:  05/11/2021 110.2 kg    Ideal Body Weight:  45.5 kg  BMI:  Body mass index is 47.45 kg/m.  Estimated Nutritional Needs:   Kcal:  1700-1900  Protein:  95-115 grams  Fluid:  1.7-1.9 L    [name], MS, RD, LDN Inpatient Clinical Dietitian Please see [facility] for contact information.

## 2021-05-07 NOTE — Progress Notes (Signed)
Progress Note  Patient Name: Lisa Hamilton Date of Encounter: 05/08/2021  [facility] Cardiologist: None   Subjective   Intubated and sedated.    Inpatient Medications    Scheduled Meds:  arformoterol  15 mcg Nebulization BID   aspirin  81 mg Per Tube QHS   budesonide (PULMICORT) nebulizer solution  0.5 mg Nebulization BID   chlorhexidine gluconate (MEDLINE KIT)  15 mL Mouth Rinse BID   Chlorhexidine Gluconate Cloth  6 each Topical Daily   clonazePAM  1 mg Per Tube QHS   feeding supplement (PROSource TF)  45 mL Per Tube Daily   gabapentin  100 mg Per Tube Q8H   heparin  5,000 Units Subcutaneous Q8H   insulin aspart  0-20 Units Subcutaneous Q4H   levETIRAcetam  500 mg Per Tube BID   mouth rinse  15 mL Mouth Rinse 10 times per day   methylPREDNISolone (SOLU-MEDROL) injection  40 mg Intravenous Q12H   montelukast  10 mg Per Tube QPM   pantoprazole sodium  40 mg Per Tube Daily   polyethylene glycol  17 g Per Tube Daily   pravastatin  40 mg Per Tube Daily   revefenacin  175 mcg Nebulization Daily   rOPINIRole  1 mg Per Tube TID   senna-docusate  2 tablet Per Tube QHS   sodium chloride flush  3 mL Intravenous Q12H   Continuous Infusions:  sodium chloride     amiodarone 30 mg/hr (05/08/2021 0832)   azithromycin Stopped (05/06/21 1157)   feeding supplement (VITAL 1.5 CAL) Stopped (05/27/2021 0000)   fentaNYL infusion INTRAVENOUS 100 mcg/hr (05/16/2021 0700)   PRN Meds: acetaminophen **OR** acetaminophen, albuterol, bisacodyl, fentaNYL (SUBLIMAZE) injection, hydrALAZINE, midazolam, [DISCONTINUED] ondansetron **OR** ondansetron (ZOFRAN) IV   Vital Signs    Vitals:   06/04/2021 0700 05/06/2021 0714 06/01/2021 0800 05/23/2021 0900  BP: 115/61  113/60 118/66  Pulse: (!) 55 60 (!) 56 (!) 57  Resp: $Remo'20 20 20 20  'hECtn$ Temp: (!) 97.2 F (36.2 C)  (!) 97.2 F (36.2 C) (!) 97.2 F (36.2 C)  TempSrc:      SpO2: 94% 94% 96% 94%  Weight:      Height:        Intake/Output Summary (Last 24  hours) at 05/23/2021 1010 Last data filed at 06/03/2021 0700 Gross per 24 hour  Intake 1465.54 ml  Output 165 ml  Net 1300.54 ml   Last 3 Weights 05/21/2021 05/06/2021 05/05/2021  Weight (lbs) 242 lb 15.2 oz 238 lb 8.6 oz 233 lb 4 oz  Weight (kg) 110.2 kg 108.2 kg 105.8 kg      Telemetry    Sinus rhythm.  Atrial fibrillation rate <100 bpm.  - Personally Reviewed  ECG    Sinus bradycardia.  Rate 58 bpm.  Low voltage. - Personally Reviewed  Physical Exam   VS:  BP 118/66   Pulse (!) 57   Temp (!) 97.2 F (36.2 C)   Resp 20   Ht 5' (1.524 m)   Wt 110.2 kg   SpO2 94%   BMI 47.45 kg/m  , BMI Body mass index is 47.45 kg/m. GENERAL: Critically ill-appearing intubated and sedated on. HEENT: Pupils equal round and reactive, fundi not visualized, oral mucosa unremarkable NECK:  No jugular venous distention, waveform within normal limits, carotid upstroke brisk and symmetric, no bruits, no thyromegaly LUNGS: Vented breath sounds.  No crackles or wheezes. HEART:  RRR.  PMI not displaced or sustained,S1 and S2 within normal limits,  no S3, no S4, no clicks, no rubs, no murmurs ABD:  Flat, positive bowel sounds normal in frequency in pitch, no bruits, no rebound, no guarding, no midline pulsatile mass, no hepatomegaly, no splenomegaly EXT:  2 plus pulses throughout, no edema, no cyanosis no clubbing SKIN:  No rashes no nodules NEURO: Opens eyes to voice.  Nods head to questions.  Moves all four extremities  PSYCH:  unable to assess  Labs    High Sensitivity Troponin:   Recent Labs  Lab 04/18/2021 1847  TROPONINIHS 10     Chemistry Recent Labs  Lab 04/09/2021 1847 05/03/21 0749 05/04/21 0300 05/05/21 0330 05/06/21 0435 05/24/2021 0419 05/23/2021 0546  NA 141   < > 145 144 143 141 142  K 4.0   < > 4.7 3.8 4.8 5.8* 5.9*  CL 99  --  101 103 102 101 101  CO2 35*  --  $R'31 31 31 'Ux$ 32 31  GLUCOSE 145*  --  163* 207* 212* 151* 142*  BUN 24*  --  39* 54* 72* 90* 91*  CREATININE 0.56  --   0.97 1.69* 2.49* 3.10* 3.15*  CALCIUM 9.4  --  9.0 8.2* 8.2* 7.9* 8.0*  MG  --   --  2.3 2.1  --   --   --   PROT 6.8  --   --   --   --  5.0*  --   ALBUMIN 3.3*  --   --   --   --  2.1*  --   AST 20  --   --   --   --  26  --   ALT 19  --   --   --   --  22  --   ALKPHOS 105  --   --   --   --  95  --   BILITOT 1.3*  --   --   --   --  0.7  --   GFRNONAA >60  --  >60 32* 20* 15* 15*  ANIONGAP 7  --  $R'13 10 10 8 10   'BR$ < > = values in this interval not displayed.    Lipids No results for input(s): CHOL, TRIG, HDL, LABVLDL, LDLCALC, CHOLHDL in the last 168 hours.  Hematology Recent Labs  Lab 05/05/21 0330 05/06/21 0435 05/08/2021 0419  WBC 15.0* 18.8* 17.2*  RBC 3.89 3.79* 3.74*  HGB 11.4* 11.2* 11.0*  HCT 38.8 38.1 37.5  MCV 99.7 100.5* 100.3*  MCH 29.3 29.6 29.4  MCHC 29.4* 29.4* 29.3*  RDW 14.4 14.7 14.9  PLT 256 213 190   Thyroid No results for input(s): TSH, FREET4 in the last 168 hours.  BNP Recent Labs  Lab 04/15/2021 1508 04/23/2021 1847  BNP  --  132.3*  PROBNP 333*  --     DDimer  Recent Labs  Lab 04/13/2021 1515  DDIMER 3.04*     Radiology    DG Chest Port 1 View  Result Date: 05/20/2021 CLINICAL DATA:  Respiratory failure. EXAM: PORTABLE CHEST 1 VIEW COMPARISON:  Chest radiograph 05/06/2021 FINDINGS: Endotracheal tube tip projects 3.6 cm above the carina. Left IJ central venous catheter tip projects at the level of the superior cavoatrial junction. Enteric tube tip is below the diaphragm but not included on the image. Unchanged retrocardiac left lower lobe opacity and diffuse airspace opacities in the right lung. Small-moderate pleural effusions bilaterally. No pneumothorax. Heart size is difficult to assess given adjacent consolidations. IMPRESSION: 1.  Unchanged left lower lobe and diffuse right airspace opacities, which could represent combination of small-moderate pleural fluid and atelectasis versus multifocal pneumonia. 2. Support lines and tubes are stable.  Electronically Signed   By: Ileana Roup M.D.   On: 05/09/2021 08:19   DG Chest Port 1 View  Result Date: 05/06/2021 CLINICAL DATA:  Respiratory failure EXAM: PORTABLE CHEST 1 VIEW COMPARISON:  May 05, 2021 FINDINGS: The ETT is in good position. A left central line terminates in the central SVC. The NG tube terminates below today's film. Diffuse opacity throughout the right lung is stable. Opacity in left perihilar region and left base is stable. No pneumothorax identified. No change in the cardiomediastinal silhouette. IMPRESSION: 1. Support apparatus as above. 2. Bilateral pulmonary opacities persist and are stable in the interval. Electronically Signed   By: Dorise Bullion III M.D.   On: 05/06/2021 08:09   EEG adult  Result Date: 05/05/2021 Derek Jack, MD     05/05/2021  9:45 PM Routine EEG Report JENISSE VULLO is a 75 y.o. female with a history of seizure who is undergoing an EEG to evaluate for seizures. Report: This EEG was acquired with electrodes placed according to the International 10-20 electrode system (including Fp1, Fp2, F3, F4, C3, C4, P3, P4, O1, O2, T3, T4, T5, T6, A1, A2, Fz, Cz, Pz). The following electrodes were missing or displaced: none. The occipital dominant rhythm was 8.5 Hz. This activity is reactive to stimulation. Drowsiness was manifested by background fragmentation; deeper stages of sleep were identified by K complexes and sleep spindles. There was no focal slowing. There were no interictal epileptiform discharges. There were no electrographic seizures identified. Photic stimulation and hyperventilation were not performed. Impression: This EEG was obtained while awake and asleep and is normal.   Clinical Correlation: Normal EEGs, however, do not rule out epilepsy. Su Monks, MD [facility] [phone] If 7pm- 7am, please page neurology on call as listed in [place].   ECHOCARDIOGRAM COMPLETE  Result Date: 05/05/2021    ECHOCARDIOGRAM REPORT   Patient  Name:   Lisa Hamilton Date of Exam: 05/05/2021 Medical Rec #:  [id number]    Height:       60.0 in Accession #:    [id number]   Weight:       233.2 lb Date of Birth:  08-07-1945   BSA:          1.993 m Patient Age:    68 years     BP:           119/67 mmHg Patient Gender: F            HR:           94 bpm. Exam Location:  Inpatient Procedure: 2D Echo, Cardiac Doppler and Color Doppler Indications:    Cardiac arrest  History:        Patient has prior history of Echocardiogram examinations, most                 recent 06/15/2013. COPD; Signs/Symptoms:Dyspnea.  Sonographer:    Clayton Lefort RDCS (AE) Referring Phys: [place] LEWIS  Sonographer Comments: Echo performed with patient supine and on artificial respirator. IMPRESSIONS  1. Left ventricular ejection fraction, by estimation, is 60 to 65%. The left ventricle has normal function. The left ventricle has no regional wall motion abnormalities. Left ventricular diastolic parameters are indeterminate.  2. Right ventricular systolic function is normal. The right ventricular size is normal. Tricuspid regurgitation  signal is inadequate for assessing PA pressure.  3. The mitral valve is normal in structure. No evidence of mitral valve regurgitation. No evidence of mitral stenosis.  4. The aortic valve is tricuspid. Aortic valve regurgitation is not visualized. Mild aortic valve sclerosis is present, with no evidence of aortic valve stenosis.  5. The inferior vena cava is normal in size with greater than 50% respiratory variability, suggesting right atrial pressure of 3 mmHg.  6. Large pericardial effusion. The pericardial effusion is circumferential. The effusion measures 2.42cm at greatest diameter posteriorly. There is RA inversion but no RV diastolic collapse and the IVC is normal size and collapses > 50% with expiration.  There is normal MV inflow pattern with respirations. No findings of impending tamponade but this is a clinical diagnosis made at the  bedside. Marland Kitchen FINDINGS  Left Ventricle: Left ventricular ejection fraction, by estimation, is 60 to 65%. The left ventricle has normal function. The left ventricle has no regional wall motion abnormalities. The left ventricular internal cavity size was normal in size. There is  no left ventricular hypertrophy. Left ventricular diastolic parameters are indeterminate. Normal left ventricular filling pressure. Right Ventricle: The right ventricular size is normal. No increase in right ventricular wall thickness. Right ventricular systolic function is normal. Tricuspid regurgitation signal is inadequate for assessing PA pressure. Left Atrium: Left atrial size was normal in size. Right Atrium: Right atrial size was normal in size. Pericardium: The effusion measures 2.42 at greatest diameter posteriorly. A large pericardial effusion is present. The pericardial effusion is circumferential. There is diastolic collapse of the right atrial wall. Mitral Valve: The mitral valve is normal in structure. No evidence of mitral valve regurgitation. No evidence of mitral valve stenosis. Tricuspid Valve: The tricuspid valve is normal in structure. Tricuspid valve regurgitation is not demonstrated. No evidence of tricuspid stenosis. Aortic Valve: The aortic valve is tricuspid. Aortic valve regurgitation is not visualized. Mild aortic valve sclerosis is present, with no evidence of aortic valve stenosis. Aortic valve mean gradient measures 5.0 mmHg. Aortic valve peak gradient measures 7.8 mmHg. Aortic valve area, by VTI measures 2.11 cm. Pulmonic Valve: The pulmonic valve was normal in structure. Pulmonic valve regurgitation is trivial. No evidence of pulmonic stenosis. Aorta: The aortic root is normal in size and structure. Venous: The inferior vena cava is normal in size with greater than 50% respiratory variability, suggesting right atrial pressure of 3 mmHg. IAS/Shunts: No atrial level shunt detected by color flow Doppler.  LEFT  VENTRICLE PLAX 2D LVIDd:         5.00 cm  Diastology LVIDs:         3.10 cm  LV e' medial:    6.09 cm/s LV PW:         0.80 cm  LV E/e' medial:  10.7 LV IVS:        1.00 cm  LV e' lateral:   8.70 cm/s LVOT diam:     2.00 cm  LV E/e' lateral: 7.5 LV SV:         51 LV SV Index:   26 LVOT Area:     3.14 cm  RIGHT VENTRICLE             IVC RV Basal diam:  3.10 cm     IVC diam: 1.30 cm RV S prime:     12.10 cm/s TAPSE (M-mode): 1.9 cm LEFT ATRIUM             Index  RIGHT ATRIUM           Index LA diam:        2.10 cm 1.05 cm/m  RA Area:     14.20 cm LA Vol (A2C):   59.9 ml 30.06 ml/m RA Volume:   33.10 ml  16.61 ml/m LA Vol (A4C):   59.4 ml 29.81 ml/m LA Biplane Vol: 61.5 ml 30.86 ml/m  AORTIC VALVE AV Area (Vmax):    2.38 cm AV Area (Vmean):   2.08 cm AV Area (VTI):     2.11 cm AV Vmax:           140.00 cm/s AV Vmean:          99.600 cm/s AV VTI:            0.241 m AV Peak Grad:      7.8 mmHg AV Mean Grad:      5.0 mmHg LVOT Vmax:         106.00 cm/s LVOT Vmean:        66.000 cm/s LVOT VTI:          0.162 m LVOT/AV VTI ratio: 0.67  AORTA Ao Root diam: 3.20 cm Ao Asc diam:  3.70 cm MITRAL VALVE MV Area (PHT): 4.10 cm    SHUNTS MV Decel Time: 185 msec    Systemic VTI:  0.16 m MV E velocity: 65.00 cm/s  Systemic Diam: 2.00 cm MV A velocity: 62.90 cm/s MV E/A ratio:  1.03 Fransico Him MD Electronically signed by Fransico Him MD Signature Date/Time: 05/05/2021/1:59:47 PM    Final    ECHOCARDIOGRAM LIMITED  Result Date: 05/08/2021    ECHOCARDIOGRAM LIMITED REPORT   Patient Name:   Lisa Hamilton Date of Exam: 05/23/2021 Medical Rec #:  [id number]    Height:       60.0 in Accession #:    [id number]   Weight:       242.9 lb Date of Birth:  1945/11/13   BSA:          2.027 m Patient Age:    75 years     BP:           113/60 mmHg Patient Gender: F            HR:           59 bpm. Exam Location:  Inpatient Procedure: Limited Echo, Color Doppler and Cardiac Doppler Indications:    I31.3 Pericardial effusion   History:        Patient has prior history of Echocardiogram examinations, most                 recent 05/05/2021. Risk Factors:Hypertension and Dyslipidemia.  Sonographer:    Raquel Sarna Senior RDCS Referring Phys: [place]  1. Large pericardial effusion (largest in posterior distribution); mild RA collapse but no RV collapse; no respirtaory flow variation; IVC dilated but pt on ventilator; no obvious tamponade physiology; findings similar to 05/05/21.  2. Left ventricular ejection fraction, by estimation, is 55 to 60%. The left ventricle has normal function. The left ventricle has no regional wall motion abnormalities.  3. Right ventricular systolic function is normal. The right ventricular size is normal.  4. Large pericardial effusion.  5. The mitral valve is normal in structure.  6. The aortic valve is tricuspid. Mild aortic valve sclerosis is present, with no evidence of aortic valve stenosis. FINDINGS  Left Ventricle: Left ventricular ejection fraction, by estimation, is 55 to 60%.  The left ventricle has normal function. The left ventricle has no regional wall motion abnormalities. Right Ventricle: The right ventricular size is normal. Right ventricular systolic function is normal. Left Atrium: Left atrial size was normal in size. Right Atrium: Right atrial size was normal in size. Pericardium: A large pericardial effusion is present. Mitral Valve: The mitral valve is normal in structure. Mild mitral annular calcification. Aortic Valve: The aortic valve is tricuspid. Mild aortic valve sclerosis is present, with no evidence of aortic valve stenosis. Additional Comments: Large pericardial effusion (largest in posterior distribution); mild RA collapse but no RV collapse; no respirtaory flow variation; IVC dilated but pt on ventilator; no obvious tamponade physiology; findings similar to 05/05/21. Kirk Ruths MD Electronically signed by Kirk Ruths MD Signature Date/Time: 05/27/2021/8:56:15 AM     Final     Cardiac Studies   Echo 05/05/21: IMPRESSIONS     1. Left ventricular ejection fraction, by estimation, is 60 to 65%. The  left ventricle has normal function. The left ventricle has no regional  wall motion abnormalities. Left ventricular diastolic parameters are  indeterminate.   2. Right ventricular systolic function is normal. The right ventricular  size is normal. Tricuspid regurgitation signal is inadequate for assessing  PA pressure.   3. The mitral valve is normal in structure. No evidence of mitral valve  regurgitation. No evidence of mitral stenosis.   4. The aortic valve is tricuspid. Aortic valve regurgitation is not  visualized. Mild aortic valve sclerosis is present, with no evidence of  aortic valve stenosis.   5. The inferior vena cava is normal in size with greater than 50%  respiratory variability, suggesting right atrial pressure of 3 mmHg.   6. Large pericardial effusion. The pericardial effusion is  circumferential. The effusion measures 2.42cm at greatest diameter  posteriorly. There is RA inversion but no RV diastolic collapse and the  IVC is normal size and collapses > 50% with expiration.   There is normal MV inflow pattern with respirations. No findings of  impending tamponade but this is a clinical diagnosis made at the bedside.   Echo 06/03/2021: IMPRESSIONS     1. Large pericardial effusion (largest in posterior distribution); mild  RA collapse but no RV collapse; no respirtaory flow variation; IVC dilated  but pt on ventilator; no obvious tamponade physiology; findings similar to  05/05/21.   2. Left ventricular ejection fraction, by estimation, is 55 to 60%. The  left ventricle has normal function. The left ventricle has no regional  wall motion abnormalities.   3. Right ventricular systolic function is normal. The right ventricular  size is normal.   4. Large pericardial effusion.   5. The mitral valve is normal in structure.   6. The  aortic valve is tricuspid. Mild aortic valve sclerosis is present,  with no evidence of aortic valve stenosis.   Patient Profile     75 y.o. female with COPD, hypertension, hyperlipidemia, and GERD admitted with acute respiratory failure.  She subsequently developed bradycardia and asystolic arrest.  She was intubated and resuscitated for 15 minutes.  She had a seizure on 9/30 and transient atrial fibrillation.  Cardiology was consulted for pericardial effusion.  Assessment & Plan    #Pericardial effusion: Over the weekend and plans were made for pericardiocentesis.  She has a large pericardial effusion that is mostly posterior.  No evidence of tamponade.  On review by the Cath Lab team it was not thought to be favorable for pericardiocentesis.  Repeat  echo today shows that her pericardial effusion is slightly smaller and there is still no evidence of tamponade.  Therefore, would favor a less invasive approach with repeat imaging.  No plan for pericardiocentesis at this time.  We will repeat echo in about a week or sooner if clinically indicated.  #Paroxysmal atrial fibrillation: Noted transiently in the setting of hypoxic respiratory failure and cardiac arrest.  On telemetry it appears that she is still having some short episodes of atrial fibrillation that are well rate-controlled.  We will transition amiodarone to oral.  Plan to discontinue prior to discharge from the hospital.  If she continues to have atrial fibrillation we will need to start anticoagulation if she continues to have episodes of atrial fibrillation.  #AKI: Nephrology following and likely to start CVVHD.   Total critical care time: 35 minutes. Critical care time was exclusive of separately billable procedures and treating other patients. Critical care was necessary to treat or prevent imminent or life-threatening deterioration. Critical care was time spent personally by me on the following activities: development of treatment  plan with patient and/or surrogate as well as nursing, discussions with consultants, evaluation of patient's response to treatment, examination of patient, obtaining history from patient or surrogate, ordering and performing treatments and interventions, ordering and review of laboratory studies, ordering and review of radiographic studies, pulse oximetry and re-evaluation of patient's condition.    For questions or updates, please contact [place] Please consult [email or web address] for contact info under        Signed, Skeet Latch, MD  05/08/2021, 10:10 AM

## 2021-05-07 NOTE — Consult Note (Signed)
[facility]  INPATIENT CONSULTATION  Reason for Consultation: AKI Requesting Provider: Dr. Tamala Julian  HPI: Lisa Hamilton is an 75 y.o. female with asthma/OHS on home O2, obesity,  HTN, HL, CKD, h/o nephrolithiasis and medullary sponge kidney who is seen for evaluation and management of AKI.   Admitted 9/29 with dyspnea > treated for asthma exacerbation with steroids and abx.   CTA ruled out PE.  9/29 had respiratory arrest > intubated.  Course further complicated by seizures, A fib, hypotension 80-90s requiring brief vasopressor support.  She was found to have mod pericardial effusion but no e/o tamponade.  Through this she's developed significant AKI with oliguria, hyperkalemia. Cr trended from normal to 3.2 this AM, BUN from 50s to 90s.  Net I/Os for the admission +7.7L.  UOP 272mL yesterday.    PMH: Past Medical History:  Diagnosis Date   Anemia    Asthmatic bronchitis    Blood transfusion without reported diagnosis 12/2017   Cataract    Constipation    chronic   COPD (chronic obstructive pulmonary disease) (HCC)    Diverticulosis of colon    DJD (degenerative joint disease)    GERD (gastroesophageal reflux disease)    rare   Glaucoma 1977   EYE SURGERY  FOR TX OF GLAUCOMA--NO LONGER HAS GLAUCOMA   Hepatic cyst    s/p surgical intervention 05/2011 -[place]   Hiatal hernia    Hx of colonic polyps    Hyperlipidemia    Hypertension    Medullary sponge kidney    Nontoxic multinodular goiter    pt unsure of this/ per pt no one told her!   Obesity    Osteoporosis    Partial small bowel obstruction ([place]) 10/15/2017   Renal calculus    s/p lithotripsy 06/2011   Restless leg syndrome    Shingles 2009   Squamous cell carcinoma of wrist, right    scalp, right wrist   Venous insufficiency    Vitamin D deficiency    PSH: Past Surgical History:  Procedure Laterality Date   bilateral carpel tunnel repairs     Dr. Shellia Carwin  left--1999   right--2008   bilateral wrist  fracture, right  1996   left 2001, Dr. Daylene Katayama   CATARACT EXTRACTION  04/07/13 R, 05/18/13 L   [facility]   COLONOSCOPY     COLONOSCOPY WITH PROPOFOL N/A 09/10/2018   Procedure: COLONOSCOPY WITH PROPOFOL;  Surgeon: Milus Banister, MD;  Location: [facility];  Service: Endoscopy;  Laterality: N/A;   eye surgery Left 11/22/2014   "MD pucker" repair   FOOT SURGERY     left foot surgery/ cut off part of the bone due to cleft foot   [place]  2012   umb hernia/ pt states it was done in 10/2017 again   INSERTION OF MESH N/A 10/15/2017   Procedure: INSERTION OF MESH;  Surgeon: Donnie Mesa, MD;  Location: [facility];  Service: General;  Laterality: N/A;   LAPAROSCOPIC LIVER CYST FENESTRATION  05/2011   LAPAROSCOPIC PARTIAL HEPATECTOMY N/A 12/04/2017   Procedure: LAPAROSCOPIC PARTIAL HEPATECTOMY;  Surgeon: [name], MD;  Location: [facility];  Service: General;  Laterality: N/A;   LIPOMA EXCISION  08/13/2012   Procedure: EXCISION LIPOMA;  Surgeon: Magnus Sinning, MD;  Location: [facility];  Service: Orthopedics;  Laterality: Left;   LITHOTRIPSY  06/16/2018   POLYPECTOMY  09/10/2018   Procedure: POLYPECTOMY;  Surgeon: Ardis Hughs,  Melene Plan, MD;  Location: [facility];  Service: Endoscopy;;   SHOULDER OPEN ROTATOR CUFF REPAIR  08/13/2012   Procedure: ROTATOR CUFF REPAIR SHOULDER OPEN;  Surgeon: Magnus Sinning, MD;  [facility]: [facility];  Service: Orthopedics;  Laterality: Left;  LEFT SHOULDER ANTERIOR ACROMINECTOMY AND ROTATOR CUFF REPAIR AND EXCISION LEFT SHOULDER LIPOMA   SQUAMOUS CELL CARCINOMA EXCISION  2010   L [place]   TONSILLECTOMY  1967   VENTRAL HERNIA REPAIR N/A 10/15/2017   Procedure: HERNIA REPAIR VENTRAL ADULT;  Surgeon: Donnie Mesa, MD;  Location: [facility];  Service: General;  Laterality: N/A;    Past Medical History:  Diagnosis Date   Anemia    Asthmatic bronchitis    Blood transfusion without reported diagnosis 12/2017   Cataract     Constipation    chronic   COPD (chronic obstructive pulmonary disease) ([place])    Diverticulosis of colon    DJD (degenerative joint disease)    GERD (gastroesophageal reflux disease)    rare   Glaucoma 1977   EYE SURGERY  FOR TX OF GLAUCOMA--NO LONGER HAS GLAUCOMA   Hepatic cyst    s/p surgical intervention 05/2011 -[place]   Hiatal hernia    Hx of colonic polyps    Hyperlipidemia    Hypertension    Medullary sponge kidney    Nontoxic multinodular goiter    pt unsure of this/ per pt no one told her!   Obesity    Osteoporosis    Partial small bowel obstruction ([place]) 10/15/2017   Renal calculus    s/p lithotripsy 06/2011   Restless leg syndrome    Shingles 2009   Squamous cell carcinoma of wrist, right    scalp, right wrist   Venous insufficiency    Vitamin D deficiency     Medications:  I have reviewed the patient's current medications.   Medications Prior to Admission  Medication Sig Dispense Refill   acetaminophen (TYLENOL) 500 MG tablet Take 1,000 mg by mouth 3 (three) times daily as needed for moderate pain or headache.     albuterol (PROVENTIL) (2.5 MG/3ML) 0.083% nebulizer solution USE 1 VIAL IN NEBULIZER 4 TIMES DAILY. Generic: VENTOLIN (Patient taking differently: Take 2.5 mg by nebulization 4 (four) times daily.) 120 mL 11   albuterol (VENTOLIN HFA) 108 (90 Base) MCG/ACT inhaler USE 1 TO 2 INHALATIONS BY  MOUTH EVERY 6 HOURS AS  NEEDED FOR WHEEZING OR  SHORTNESS OF BREATH (Patient taking differently: Inhale 1-2 puffs into the lungs every 6 (six) hours as needed for wheezing or shortness of breath.) 34 g 3   alendronate (FOSAMAX) 70 MG tablet Take 1 tablet (70 mg total) by mouth every 7 (seven) days. Take with a full glass of water on an empty stomach. (Patient taking differently: Take 70 mg by mouth every Tuesday. Take with a full glass of water on an empty stomach.) 12 tablet 3   aspirin EC 81 MG tablet Take 81 mg by mouth at bedtime.     clonazePAM (KLONOPIN) 0.5 MG  tablet TAKE 1 TO 3 TABLETS BY  MOUTH 30 MINUTES BEFORE  BEDTIME (Patient taking differently: Take 0.5-1.5 mg by mouth See admin instructions. 30 MINUTES BEFORE  BEDTIME) 270 tablet 1   Co-Enzyme Q-10 100 MG CAPS Take 100 mg by mouth daily.     Cranberry 180 MG CAPS Take 180 mg by mouth daily.     donepezil (ARICEPT) 10 MG tablet Take 1 tablet (10 mg total) by mouth at bedtime. [place]  tablet 3   fluticasone (FLONASE) 50 MCG/ACT nasal spray Place 2 sprays into both nostrils daily. (Patient taking differently: Place 2 sprays into both nostrils daily as needed for allergies.) 16 g 6   fluticasone furoate-vilanterol (BREO ELLIPTA) 100-25 MCG/INH AEPB Inhale 1 puff then rinse mouth,once daily (Patient taking differently: Inhale 1 puff into the lungs daily.) 180 each 4   gabapentin (NEURONTIN) 300 MG capsule Take 1 capsule (300 mg total) by mouth 3 (three) times daily. 270 capsule 3   guaiFENesin (MUCINEX) 600 MG 12 hr tablet Take 600 mg by mouth 2 (two) times daily as needed (congestion).      ibuprofen (ADVIL,MOTRIN) 200 MG tablet Take 400 mg by mouth 3 (three) times daily as needed for headache or moderate pain.     losartan (COZAAR) 50 MG tablet Take 1 tablet (50 mg total) by mouth daily. 90 tablet 3   Magnesium 400 MG CAPS Take 400 mg by mouth daily.     montelukast (SINGULAIR) 10 MG tablet Take 1 tablet (10 mg total) by mouth every evening. 90 tablet 3   Multiple Vitamins-Minerals (PRESERVISION AREDS 2) CAPS Take 1 capsule by mouth 2 (two) times daily.     OXYGEN Inhale 3-4 L into the lungs See admin instructions. 3 liters during the day 4 liters at bedtime     polyethylene glycol (MIRALAX / GLYCOLAX) packet Take 17 g by mouth 3 (three) times daily as needed for moderate constipation.     pravastatin (PRAVACHOL) 40 MG tablet Take 1 tablet (40 mg total) by mouth daily. 90 tablet 3   Propylene Glycol (SYSTANE BALANCE OP) Place 1 drop into both eyes daily as needed (dry eyes).     rOPINIRole (REQUIP) 0.5  MG tablet Take up to 6 tablets by mouth daily as needed (Patient taking differently: Take 1 mg by mouth 3 (three) times daily.) 540 tablet 3   traMADol (ULTRAM) 50 MG tablet Take 1 tablet (50 mg total) by mouth daily as needed. (Patient taking differently: Take 50 mg by mouth daily as needed for moderate pain.) 90 tablet 0   Turmeric 500 MG CAPS Take 500 mg by mouth daily.     vitamin B-12 (CYANOCOBALAMIN) 1000 MCG tablet Take 1,000 mcg by mouth daily.     furosemide (LASIX) 20 MG tablet Take 1 tablet (20 mg total) by mouth daily. (Patient not taking: No sig reported) 90 tablet 1   predniSONE (DELTASONE) 10 MG tablet Take 4 tablets (40 mg total) by mouth daily with breakfast for 2 days, THEN 3 tablets (30 mg total) daily with breakfast for 2 days, THEN 2 tablets (20 mg total) daily with breakfast for 2 days, THEN 1 tablet (10 mg total) daily with breakfast for 2 days. 20 tablet 0    ALLERGIES:   Allergies  Allergen Reactions   Hydromorphone Hcl Other (See Comments)    Disorientation and delirium.    Penicillins Rash and Other (See Comments)    PATIENT HAS HAD A PCN REACTION WITH IMMEDIATE RASH, FACIAL/TONGUE/THROAT SWELLING, SOB, OR LIGHTHEADEDNESS WITH HYPOTENSION:  #  #  YES  #  #  Has patient had a PCN reaction causing severe rash involving mucus membranes or skin necrosis: No Has patient had a PCN reaction that required hospitalization: No Has patient had a PCN reaction occurring within the last 10 years: No If all of the above answers are "NO", then may proceed with Cephalosporin use.    Adhesive [Tape] Rash and Other (See Comments)  Blisters The clear tape    Amoxicillin-Pot Clavulanate Rash   Codeine Other (See Comments)    nightmares    FAM HX: Family History  Problem Relation Age of Onset   Lung cancer Father    Colon cancer Father        possible colon cancer, but unsure   Lung cancer Sister    Irritable bowel syndrome Sister    Obstructive Sleep Apnea Sister     Osteoarthritis Sister    Esophageal cancer Neg Hx    Rectal cancer Neg Hx    Stomach cancer Neg Hx     Social History:   reports that she has never smoked. She has never used smokeless tobacco. She reports current alcohol use. She reports that she does not use drugs.  ROS: unable to obtain from intubated and sedated patient  Blood pressure 118/66, pulse (!) 57, temperature (!) 97.2 F (36.2 C), resp. rate 20, height 5' (1.524 m), weight 110.2 kg, SpO2 94 %. PHYSICAL EXAM: Gen: intubated and sedated  ENT: ETT in place Neck: thick CV:  distant HS, no rub Abd: soft,obese Lungs: clear L, wheezes R GU: foley draining clear yellow urine Extr:  no edema Neuro: sedated Skin: cool and dry, no rashes noted   Results for orders placed or performed during the hospital encounter of 04/13/2021 (from the past 48 hour(s))  Glucose, capillary     Status: Abnormal   Collection Time: 05/05/21 11:25 AM  Result Value Ref Range   Glucose-Capillary 219 (H) 70 - 99 mg/dL    Comment: Glucose reference range applies only to samples taken after fasting for at least 8 hours.  Glucose, capillary     Status: Abnormal   Collection Time: 05/05/21  3:33 PM  Result Value Ref Range   Glucose-Capillary 158 (H) 70 - 99 mg/dL    Comment: Glucose reference range applies only to samples taken after fasting for at least 8 hours.  Glucose, capillary     Status: Abnormal   Collection Time: 05/05/21  7:28 PM  Result Value Ref Range   Glucose-Capillary 134 (H) 70 - 99 mg/dL    Comment: Glucose reference range applies only to samples taken after fasting for at least 8 hours.  Glucose, capillary     Status: Abnormal   Collection Time: 05/05/21 11:36 PM  Result Value Ref Range   Glucose-Capillary 125 (H) 70 - 99 mg/dL    Comment: Glucose reference range applies only to samples taken after fasting for at least 8 hours.  Glucose, capillary     Status: Abnormal   Collection Time: 05/06/21  3:19 AM  Result Value Ref Range    Glucose-Capillary 164 (H) 70 - 99 mg/dL    Comment: Glucose reference range applies only to samples taken after fasting for at least 8 hours.  CBC     Status: Abnormal   Collection Time: 05/06/21  4:35 AM  Result Value Ref Range   WBC 18.8 (H) 4.0 - 10.5 K/uL   RBC 3.79 (L) 3.87 - 5.11 MIL/uL   Hemoglobin 11.2 (L) 12.0 - 15.0 g/dL   HCT 38.1 36.0 - 46.0 %   MCV 100.5 (H) 80.0 - 100.0 fL   MCH 29.6 26.0 - 34.0 pg   MCHC 29.4 (L) 30.0 - 36.0 g/dL   RDW 14.7 11.5 - 15.5 %   Platelets 213 150 - 400 K/uL   nRBC 0.1 0.0 - 0.2 %    Comment: Performed at [facility],  [address] [place], [place] [address]  Basic metabolic panel     Status: Abnormal   Collection Time: 05/06/21  4:35 AM  Result Value Ref Range   Sodium 143 135 - 145 mmol/L   Potassium 4.8 3.5 - 5.1 mmol/L    Comment: DELTA CHECK NOTED   Chloride 102 98 - 111 mmol/L   CO2 31 22 - 32 mmol/L   Glucose, Bld 212 (H) 70 - 99 mg/dL    Comment: Glucose reference range applies only to samples taken after fasting for at least 8 hours.   BUN 72 (H) 8 - 23 mg/dL   Creatinine, Ser 2.49 (H) 0.44 - 1.00 mg/dL   Calcium 8.2 (L) 8.9 - 10.3 mg/dL   GFR, Estimated 20 (L) >60 mL/min    Comment: (NOTE) Calculated using the CKD-EPI Creatinine Equation (2021)    Anion gap 10 5 - 15    Comment: Performed at [facility] [address], [place], [place] [address]  Phosphorus     Status: None   Collection Time: 05/06/21  4:35 AM  Result Value Ref Range   Phosphorus 3.5 2.5 - 4.6 mg/dL    Comment: Performed at [facility] [address], [place], [place] [address]  Protime-INR     Status: Abnormal   Collection Time: 05/06/21  4:35 AM  Result Value Ref Range   Prothrombin Time 17.7 (H) 11.4 - 15.2 seconds   INR 1.5 (H) 0.8 - 1.2    Comment: (NOTE) INR goal varies based on device and disease states. Performed at [facility], [place] [address], [place], [place] [address]   Glucose, capillary     Status:  Abnormal   Collection Time: 05/06/21  7:13 AM  Result Value Ref Range   Glucose-Capillary 181 (H) 70 - 99 mg/dL    Comment: Glucose reference range applies only to samples taken after fasting for at least 8 hours.  Glucose, capillary     Status: Abnormal   Collection Time: 05/06/21 11:18 AM  Result Value Ref Range   Glucose-Capillary 184 (H) 70 - 99 mg/dL    Comment: Glucose reference range applies only to samples taken after fasting for at least 8 hours.  Glucose, capillary     Status: Abnormal   Collection Time: 05/06/21  3:24 PM  Result Value Ref Range   Glucose-Capillary 178 (H) 70 - 99 mg/dL    Comment: Glucose reference range applies only to samples taken after fasting for at least 8 hours.  Glucose, capillary     Status: Abnormal   Collection Time: 05/06/21  7:37 PM  Result Value Ref Range   Glucose-Capillary 154 (H) 70 - 99 mg/dL    Comment: Glucose reference range applies only to samples taken after fasting for at least 8 hours.  Glucose, capillary     Status: Abnormal   Collection Time: 05/06/21 11:52 PM  Result Value Ref Range   Glucose-Capillary 129 (H) 70 - 99 mg/dL    Comment: Glucose reference range applies only to samples taken after fasting for at least 8 hours.  Glucose, capillary     Status: Abnormal   Collection Time: 06/02/2021  3:51 AM  Result Value Ref Range   Glucose-Capillary 134 (H) 70 - 99 mg/dL    Comment: Glucose reference range applies only to samples taken after fasting for at least 8 hours.  Comprehensive metabolic panel     Status: Abnormal   Collection Time: 05/27/2021  4:19 AM  Result Value Ref Range   Sodium 141 135 - 145 mmol/L   Potassium 5.8 (H) 3.5 - 5.1 mmol/L    Comment: NO VISIBLE HEMOLYSIS   Chloride 101 98 - 111 mmol/L   CO2 32 22 - 32 mmol/L   Glucose, Bld 151 (H) 70 - 99 mg/dL    Comment: Glucose reference range applies only to samples taken after fasting for at least 8 hours.   BUN 90 (H) 8 - 23 mg/dL   Creatinine, Ser 3.10 (H) 0.44  - 1.00 mg/dL   Calcium 7.9 (L) 8.9 - 10.3 mg/dL   Total Protein 5.0 (L) 6.5 - 8.1 g/dL   Albumin 2.1 (L) 3.5 - 5.0 g/dL   AST 26 15 - 41 U/L   ALT 22 0 - 44 U/L   Alkaline Phosphatase 95 38 - 126 U/L   Total Bilirubin 0.7 0.3 - 1.2 mg/dL   GFR, Estimated 15 (L) >60 mL/min    Comment: (NOTE) Calculated using the CKD-EPI Creatinine Equation (2021)    Anion gap 8 5 - 15    Comment: Performed at [facility], [place] [address], [place], [place] [address]  CBC     Status: Abnormal   Collection Time: 05/12/2021  4:19 AM  Result Value Ref Range   WBC 17.2 (H) 4.0 - 10.5 K/uL   RBC 3.74 (L) 3.87 - 5.11 MIL/uL   Hemoglobin 11.0 (L) 12.0 - 15.0 g/dL   HCT 37.5 36.0 - 46.0 %   MCV 100.3 (H) 80.0 - 100.0 fL   MCH 29.4 26.0 - 34.0 pg   MCHC 29.3 (L) 30.0 - 36.0 g/dL   RDW 14.9 11.5 - 15.5 %   Platelets 190 150 - 400 K/uL   nRBC 0.2 0.0 - 0.2 %    Comment: Performed at [facility], [place] [address] [place], [place] [address]  Basic metabolic panel     Status: Abnormal   Collection Time: 05/05/2021  5:46 AM  Result Value Ref Range   Sodium 142 135 - 145 mmol/L   Potassium 5.9 (H) 3.5 - 5.1 mmol/L   Chloride 101 98 - 111 mmol/L   CO2 31 22 - 32 mmol/L   Glucose, Bld 142 (H) 70 - 99 mg/dL    Comment: Glucose reference range applies only to samples taken after fasting for at least 8 hours.   BUN 91 (H) 8 - 23 mg/dL   Creatinine, Ser 3.15 (H) 0.44 - 1.00 mg/dL   Calcium 8.0 (L) 8.9 - 10.3 mg/dL   GFR, Estimated 15 (L) >60 mL/min    Comment: (NOTE) Calculated using the CKD-EPI Creatinine Equation (2021)    Anion gap 10 5 - 15    Comment: Performed at [facility] [address] [place], [place] [address]  Glucose, capillary     Status: Abnormal   Collection Time: 05/23/2021  7:25 AM  Result Value Ref Range   Glucose-Capillary 125 (H) 70 - 99 mg/dL    Comment: Glucose reference range applies only to samples taken after fasting for at least 8 hours.  Potassium     Status:  Abnormal   Collection Time: 05/28/2021  9:02 AM  Result Value Ref Range   Potassium 5.6 (H) 3.5 - 5.1 mmol/L    Comment: Performed at [facility] [address], [place], [place] [address]    DG Chest Port 1 View  Result Date: 05/12/2021 CLINICAL DATA:  Respiratory  failure. EXAM: PORTABLE CHEST 1 VIEW COMPARISON:  Chest radiograph 05/06/2021 FINDINGS: Endotracheal tube tip projects 3.6 cm above the carina. Left IJ central venous catheter tip projects at the level of the superior cavoatrial junction. Enteric tube tip is below the diaphragm but not included on the image. Unchanged retrocardiac left lower lobe opacity and diffuse airspace opacities in the right lung. Small-moderate pleural effusions bilaterally. No pneumothorax. Heart size is difficult to assess given adjacent consolidations. IMPRESSION: 1. Unchanged left lower lobe and diffuse right airspace opacities, which could represent combination of small-moderate pleural fluid and atelectasis versus multifocal pneumonia. 2. Support lines and tubes are stable. Electronically Signed   By: Ileana Roup M.D.   On: 05/06/2021 08:19   DG Chest Port 1 View  Result Date: 05/06/2021 CLINICAL DATA:  Respiratory failure EXAM: PORTABLE CHEST 1 VIEW COMPARISON:  May 05, 2021 FINDINGS: The ETT is in good position. A left central line terminates in the central SVC. The NG tube terminates below today's film. Diffuse opacity throughout the right lung is stable. Opacity in left perihilar region and left base is stable. No pneumothorax identified. No change in the cardiomediastinal silhouette. IMPRESSION: 1. Support apparatus as above. 2. Bilateral pulmonary opacities persist and are stable in the interval. Electronically Signed   By: Dorise Bullion III M.D.   On: 05/06/2021 08:09   EEG adult  Result Date: 05/05/2021 Derek Jack, MD     05/05/2021  9:45 PM Routine EEG Report SHAKEMA SURITA is a 75 y.o. female with a history of seizure who is  undergoing an EEG to evaluate for seizures. Report: This EEG was acquired with electrodes placed according to the International 10-20 electrode system (including Fp1, Fp2, F3, F4, C3, C4, P3, P4, O1, O2, T3, T4, T5, T6, A1, A2, Fz, Cz, Pz). The following electrodes were missing or displaced: none. The occipital dominant rhythm was 8.5 Hz. This activity is reactive to stimulation. Drowsiness was manifested by background fragmentation; deeper stages of sleep were identified by K complexes and sleep spindles. There was no focal slowing. There were no interictal epileptiform discharges. There were no electrographic seizures identified. Photic stimulation and hyperventilation were not performed. Impression: This EEG was obtained while awake and asleep and is normal.   Clinical Correlation: Normal EEGs, however, do not rule out epilepsy. Su Monks, MD [facility] [phone] If 7pm- 7am, please page neurology on call as listed in [place].   ECHOCARDIOGRAM COMPLETE  Result Date: 05/05/2021    ECHOCARDIOGRAM REPORT   Patient Name:   Lisa Hamilton Date of Exam: 05/05/2021 Medical Rec #:  [id number]    Height:       60.0 in Accession #:    [id number]   Weight:       233.2 lb Date of Birth:  [birth year]   BSA:          1.993 m Patient Age:    89 years     BP:           119/67 mmHg Patient Gender: F            HR:           94 bpm. Exam Location:  Inpatient Procedure: 2D Echo, Cardiac Doppler and Color Doppler Indications:    Cardiac arrest  History:        Patient has prior history of Echocardiogram examinations, most                 recent 06/15/2013.  COPD; Signs/Symptoms:Dyspnea.  Sonographer:    Clayton Lefort RDCS (AE) Referring Phys: [place] LEWIS  Sonographer Comments: Echo performed with patient supine and on artificial respirator. IMPRESSIONS  1. Left ventricular ejection fraction, by estimation, is 60 to 65%. The left ventricle has normal function. The left ventricle has no regional wall  motion abnormalities. Left ventricular diastolic parameters are indeterminate.  2. Right ventricular systolic function is normal. The right ventricular size is normal. Tricuspid regurgitation signal is inadequate for assessing PA pressure.  3. The mitral valve is normal in structure. No evidence of mitral valve regurgitation. No evidence of mitral stenosis.  4. The aortic valve is tricuspid. Aortic valve regurgitation is not visualized. Mild aortic valve sclerosis is present, with no evidence of aortic valve stenosis.  5. The inferior vena cava is normal in size with greater than 50% respiratory variability, suggesting right atrial pressure of 3 mmHg.  6. Large pericardial effusion. The pericardial effusion is circumferential. The effusion measures 2.42cm at greatest diameter posteriorly. There is RA inversion but no RV diastolic collapse and the IVC is normal size and collapses > 50% with expiration.  There is normal MV inflow pattern with respirations. No findings of impending tamponade but this is a clinical diagnosis made at the bedside. Marland Kitchen FINDINGS  Left Ventricle: Left ventricular ejection fraction, by estimation, is 60 to 65%. The left ventricle has normal function. The left ventricle has no regional wall motion abnormalities. The left ventricular internal cavity size was normal in size. There is  no left ventricular hypertrophy. Left ventricular diastolic parameters are indeterminate. Normal left ventricular filling pressure. Right Ventricle: The right ventricular size is normal. No increase in right ventricular wall thickness. Right ventricular systolic function is normal. Tricuspid regurgitation signal is inadequate for assessing PA pressure. Left Atrium: Left atrial size was normal in size. Right Atrium: Right atrial size was normal in size. Pericardium: The effusion measures 2.42 at greatest diameter posteriorly. A large pericardial effusion is present. The pericardial effusion is circumferential. There  is diastolic collapse of the right atrial wall. Mitral Valve: The mitral valve is normal in structure. No evidence of mitral valve regurgitation. No evidence of mitral valve stenosis. Tricuspid Valve: The tricuspid valve is normal in structure. Tricuspid valve regurgitation is not demonstrated. No evidence of tricuspid stenosis. Aortic Valve: The aortic valve is tricuspid. Aortic valve regurgitation is not visualized. Mild aortic valve sclerosis is present, with no evidence of aortic valve stenosis. Aortic valve mean gradient measures 5.0 mmHg. Aortic valve peak gradient measures 7.8 mmHg. Aortic valve area, by VTI measures 2.11 cm. Pulmonic Valve: The pulmonic valve was normal in structure. Pulmonic valve regurgitation is trivial. No evidence of pulmonic stenosis. Aorta: The aortic root is normal in size and structure. Venous: The inferior vena cava is normal in size with greater than 50% respiratory variability, suggesting right atrial pressure of 3 mmHg. IAS/Shunts: No atrial level shunt detected by color flow Doppler.  LEFT VENTRICLE PLAX 2D LVIDd:         5.00 cm  Diastology LVIDs:         3.10 cm  LV e' medial:    6.09 cm/s LV PW:         0.80 cm  LV E/e' medial:  10.7 LV IVS:        1.00 cm  LV e' lateral:   8.70 cm/s LVOT diam:     2.00 cm  LV E/e' lateral: 7.5 LV SV:  51 LV SV Index:   26 LVOT Area:     3.14 cm  RIGHT VENTRICLE             IVC RV Basal diam:  3.10 cm     IVC diam: 1.30 cm RV S prime:     12.10 cm/s TAPSE (M-mode): 1.9 cm LEFT ATRIUM             Index       RIGHT ATRIUM           Index LA diam:        2.10 cm 1.05 cm/m  RA Area:     14.20 cm LA Vol (A2C):   59.9 ml 30.06 ml/m RA Volume:   33.10 ml  16.61 ml/m LA Vol (A4C):   59.4 ml 29.81 ml/m LA Biplane Vol: 61.5 ml 30.86 ml/m  AORTIC VALVE AV Area (Vmax):    2.38 cm AV Area (Vmean):   2.08 cm AV Area (VTI):     2.11 cm AV Vmax:           140.00 cm/s AV Vmean:          99.600 cm/s AV VTI:            0.241 m AV Peak Grad:       7.8 mmHg AV Mean Grad:      5.0 mmHg LVOT Vmax:         106.00 cm/s LVOT Vmean:        66.000 cm/s LVOT VTI:          0.162 m LVOT/AV VTI ratio: 0.67  AORTA Ao Root diam: 3.20 cm Ao Asc diam:  3.70 cm MITRAL VALVE MV Area (PHT): 4.10 cm    SHUNTS MV Decel Time: 185 msec    Systemic VTI:  0.16 m MV E velocity: 65.00 cm/s  Systemic Diam: 2.00 cm MV A velocity: 62.90 cm/s MV E/A ratio:  1.03 Fransico Him MD Electronically signed by Fransico Him MD Signature Date/Time: 05/05/2021/1:59:47 PM    Final    ECHOCARDIOGRAM LIMITED  Result Date: 05/08/2021    ECHOCARDIOGRAM LIMITED REPORT   Patient Name:   Lisa Hamilton Date of Exam: 05/12/2021 Medical Rec #:  [id number]    Height:       60.0 in Accession #:    [id number]   Weight:       242.9 lb Date of Birth:  Apr 10, 1946   BSA:          2.027 m Patient Age:    39 years     BP:           113/60 mmHg Patient Gender: F            HR:           59 bpm. Exam Location:  Inpatient Procedure: Limited Echo, Color Doppler and Cardiac Doppler Indications:    I31.3 Pericardial effusion  History:        Patient has prior history of Echocardiogram examinations, most                 recent 05/05/2021. Risk Factors:Hypertension and Dyslipidemia.  Sonographer:    Raquel Sarna Senior RDCS Referring Phys: [place]  1. Large pericardial effusion (largest in posterior distribution); mild RA collapse but no RV collapse; no respirtaory flow variation; IVC dilated but pt on ventilator; no obvious tamponade physiology; findings similar to 05/05/21.  2. Left ventricular ejection fraction, by estimation, is 55  to 60%. The left ventricle has normal function. The left ventricle has no regional wall motion abnormalities.  3. Right ventricular systolic function is normal. The right ventricular size is normal.  4. Large pericardial effusion.  5. The mitral valve is normal in structure.  6. The aortic valve is tricuspid. Mild aortic valve sclerosis is present, with no evidence of aortic  valve stenosis. FINDINGS  Left Ventricle: Left ventricular ejection fraction, by estimation, is 55 to 60%. The left ventricle has normal function. The left ventricle has no regional wall motion abnormalities. Right Ventricle: The right ventricular size is normal. Right ventricular systolic function is normal. Left Atrium: Left atrial size was normal in size. Right Atrium: Right atrial size was normal in size. Pericardium: A large pericardial effusion is present. Mitral Valve: The mitral valve is normal in structure. Mild mitral annular calcification. Aortic Valve: The aortic valve is tricuspid. Mild aortic valve sclerosis is present, with no evidence of aortic valve stenosis. Additional Comments: Large pericardial effusion (largest in posterior distribution); mild RA collapse but no RV collapse; no respirtaory flow variation; IVC dilated but pt on ventilator; no obvious tamponade physiology; findings similar to 05/05/21. Kirk Ruths MD Electronically signed by Kirk Ruths MD Signature Date/Time: 05/22/2021/8:56:15 AM    Final     Assessment/Plan **AHRF, multifocal PNA: on significant vent support per primary currently. Steroids, empiric abx. Volume management with CRRT, will start with net neg 90mL/hr to start.    **AKI, severe, oliguric:  Multifactorial with contributions of contrast, hypotension > ATN.  Developing hyperkalemia, concern for AMS from uremia.   --PCCM placing trialysis catheter and will initiate CRRT today given tenuous status and pericardial effusion (albeit no tamponade noted).   -- Will attempt gentle UF for net negative 65mL/hr to start --Send UA, FeNa --Check renal US for completeness --Avoid nephrotoxins and hypotension as able  **Hyperkalemia:  has rec'd lokelma, start CRRT  **A fib: on amiodarone; in NSR currently.  Cardiology following.   **Pericardial effusion:  no tamponade and smaller on exam today.  The effusion preceeded AKI/significant BUN elevation so not  consistent with uremic etiology.   Justin Mend 05/09/2021, 10:30 AM

## 2021-05-07 NOTE — Procedures (Signed)
Bronchoscopy Procedure Note  Lisa Hamilton  [id number]  1945/11/20  Date:05/21/2021  Time:5:23 PM   Provider Performing:Lisa Hamilton   Procedure(s):  Flexible bronchoscopy with bronchial alveolar lavage (83382)  Indication(s) Hemoptysis   Consent Risks of the procedure as well as the alternatives and risks of each were explained to the patient and/or caregiver.  Consent for the procedure was obtained and is signed in the bedside chart obtained by Dr. Tamala Hamilton.   Anesthesia Etomidate, rocuronium, versed   Time Out Verified patient identification, verified procedure, site/side was marked, verified correct patient position, special equipment/implants available, medications/allergies/relevant history reviewed, required imaging and test results available.  Sterile Technique Usual hand hygiene, masks, gowns, and gloves were used  Procedure Description Bronchoscope advanced through endotracheal tube and into airway.  Airways were examined down to subsegmental level with findings noted below.  There was visible oozing blood from all distal subsegments. Therapeutic aspiration of the bilateral mainstem were necessary. Large clot in the left mainstem that was suctioned clear and removed with en bloc retraction. Following diagnostic evaluation, BAL(s) performed in right upper lobe anterior segment with normal saline and return of bloody sanginous fluid consistent with alveolar hemmorrhage/alveolitis.   Complications/Tolerance None; patient tolerated the procedure well. Chest X-ray is needed post procedure.  EBL Minimal  Specimen(s) BAL RUL anterior segment   Lisa Nash, DO [facility] 06/01/2021 5:29 PM

## 2021-05-07 NOTE — Progress Notes (Addendum)
05/28/2021   I have seen and evaluated the patient for cardiac arrest  S:  No events, awake on vent. Renal function unfortunately worse  O: Blood pressure 118/66, pulse (!) 57, temperature (!) 97.2 F (36.2 C), resp. rate 20, height 5' (1.524 m), weight 110.2 kg, SpO2 94 %.  RASS -1 Lungs with wheezing bilaterally Developing anasarca Wakes up and moves ext to command CXR worsening edema CBC stable  WBC improved K continues to rise BUN/Cr rising 7.7L positive for admission  A:  IHCA, respiratory induced with hx of O2 dependent COPD in flare Some combo aspiration pneumonitis and pulmonary edema on vent Worsening uremia and potassium unfortunately probably needs HD  Pericardial effusion with symptoms sounding like they originated after her COVID shot; question mRNA vaccine associated pericarditis; no tamponade, do not think this was proximal cause of arrest but certainly did not help things.    Post cardiac arrest ATN now unfortunately approaching need for HD  P:  - Nephro consult for iHD vs. CRRT - Needs fluid off before we can SAT/SBT - Switch IV steroids to PO - Continue LABA/LAMA/ICS - 5 days abx are fine - Check RVP - Husband at bedside updated, is realistic but hopeful  Patient critically ill due to respiratory failure Interventions to address this today vent titration Risk of deterioration without these interventions is high  I personally spent 45 minutes providing critical care not including any separately billable procedures  Erskine Emery MD [facility] epic messenger for cross cover needs If after hours, please call E-link      NAME:  Lisa Hamilton, MRN:  [id number], DOB:  1945-11-28, LOS: 4 ADMISSION DATE:  04/18/2021, CONSULTATION DATE:  9/29 REFERRING MD:  Dr. Lorin Mercy, CHIEF COMPLAINT:  Cardiac arrest   History of Present Illness:  75 yr old female followed by Dr. Keturah Barre for COPD from asthma and chronic respiratory failure  on 2 to 3 liters oxygen developed progressive cough, dyspnea, wheezing, and weakness.  Her husband reports she received COVID vaccine booster 2 days prior to developing symptoms.  She presented to ER on 9/28 and started on Bipap for acute on chronic hypoxic/hypercapnic respiratory failure.  She was started on steroids and antibiotics.  Had initial improvement and transitioned off Bipap per patient request.  Several hours later she then developed bradycardia leading to asystolic cardiac arrest.  Had ROSC after about 15 minutes.  Intubated and transferred to ICU.  Pertinent  Medical History  Anemia, Diverticulosis, DJD, GERD, Glaucoma, Hepatic cyst, Hiatal hernia, Colon polyps, HLD, HTN, Medullary sponge kidney, Goiter, Osteoporosis, SBO, Nephrolithiasis, RLS, Shingles, Vit D deficiency, prediabetes,  Significant Hospital Events: Including procedures, antibiotic start and stop dates in addition to other pertinent events   9/28 Admit 9/29 respiratory leading to cardiac arrest, intubated and transferred to ICU 9/30 tonic-clonic seizure and started on keppra, developed transient A fib and started on amiodarone, started on peripheral phenylephrine, Lt IJ CVL placed 10/1 back in sinus rhythm, off pressors, remains on increased PEEP/FiO2 10/03 remains in sinus rhythm on amiodarone, mild improvement of PEEP/FiO2, HD cath placed, Nephro consulted.  Interim History / Subjective:  Patient resting in bed.  She is intubated and sedated with a RASS of -1 to -2.  Does not appear to be in any distress.   Objective   Blood pressure 109/73, pulse (!) 59, temperature (!) 97.3 F (36.3 C), resp. rate 20, height 5' (1.524 m), weight 110.2 kg, SpO2 93 %. CVP:  [17  mmHg-20 mmHg] 17 mmHg  Vent Mode: PRVC FiO2 (%):  [90 %-100 %] 90 % Set Rate:  [20 bmp] 20 bmp Vt Set:  [370 mL] 370 mL PEEP:  [8 cmH20-10 cmH20] 10 cmH20 Plateau Pressure:  [23 cmH20-27 cmH20] 26 cmH20   Intake/Output Summary (Last 24 hours) at  05/19/2021 0648 Last data filed at 06/03/2021 0100 Gross per 24 hour  Intake 1780.67 ml  Output 110 ml  Net 1670.67 ml   Filed Weights   05/05/21 0500 05/06/21 0500 05/16/2021 0500  Weight: 105.8 kg 108.2 kg 110.2 kg    Examination: General -sedated Eyes - pupils reactive ENT - ETT in place Cardiac - regular rate/rhythm, no murmur Chest - scattered rhonchi, and wheezing Abdomen - soft, non tender, + bowel sounds Extremities - 1+ edema, warm and clammy.  Skin - no rashes Neuro - RASS 0   Patient Lines/Drains/Airways Status     Active Line/Drains/Airways     Name Placement date Placement time Site Days   Peripheral IV 05/03/21 20 G Right Antecubital 05/03/21  1710  Antecubital  3   Peripheral IV 05/04/21 20 G 2.5" Anterior;Left;Proximal;Upper Arm 05/04/21  0415  Arm  2   CVC Triple Lumen 05/04/21 Left Internal jugular 05/04/21  1800  -- 2   NG/OG Vented/Dual Lumen Oral 05/03/21  1900  Oral  3   Urethral Catheter  Temperature probe 05/04/21  0230  Temperature probe  2   Airway 7.5 mm 05/03/21  2310  -- 3   Incision (Closed) 10/15/17 Abdomen 10/15/17  1028  -- 1299   Incision (Closed) 12/04/17 Abdomen 12/04/17  1241  -- 1249   Incision - 5 Ports Abdomen 1: Umbilicus 2: Right 3: Left;Upper 4: Left;Mid 5: Left;Lower 12/04/17  1450  -- 1249   Wound / Incision (Open or Dehisced) 12/10/17 Incision - Open Abdomen Lower;Medial;Right JP drain site 12/10/17  2040  Abdomen  1243                  Resolved Hospital Problem list   Cardiogenic shock  Assessment & Plan:   Acute on chronic hypoxic/hypercapnic respiratory failure in setting of asthma/COPD exacerbation with CAP. Patient remains on the vent with mild improvement of FiO2 requirements.  - not ready for vent weaning at this time - adjust PEEP/FiO2 to keep SpO2 > 92% - On day #5/5 of AB, currently on rocephin and zithromax - On day 4 of systemic steroids, solumedrol to 40 mg q12h - add yupelri, brovana and continue  pulmicort, singulair - prn albuterol - CXR pending  Pericardial Effusion Respiratory leading to cardiac arrest. New onset atrial fibrillation with RVR >> back in sinus rhythm on 10/1. Hx of HTN, HLD. Patient is in sinus rhythm on amiodarone.  He has a pericardial effusion for which cardiology advised to perform a pericardiocentesis today or tomorrow.  She has soft blood pressures but remains off of pressors at this time. - continue amiodarone - continue ASA, pravachol - Continue to hold home cozaar  Oliguric AKI 2/2 ATN: Hyperkalemia: Patient has oliguric/anuric AKI likely secondary to ATN with worsening electrolyte abnormalities including hyperkalemia at 5.9.  She has had minimal to no improvement with IV Lasix.  She received a dose of Lokelma this morning.  Considering she continues to retain fluid and is an uric with electrolyte abnormalities, will consult nephrology and place hemodialysis catheter for renal replacement therapy. -Consult nephrology -Place HD catheter -We will, given this morning for hyperkalemia -We will continue to monitor  kidney function and urine output post  Acute metabolic encephalopathy from hypoxia, hypercapnia. Isolated seizure after cardiac arrest on 9/30. Hx of RLS. Patient continues to have encephalopathy that is multifactorial in the setting of acute kidney failure/uremia, respiratory arrest with hypotension, and sedation.  She currently has a RASS of -1 to -2.  She remains on Keppra due to seizure postarrest.  Her EEG did not show any significant signs or symptoms of seizures.  Therefore, likely secondary to her respiratory/metabolic disturbances.  We will DC Keppra once patient is improved and extubated. - RASS goal 0 to -1 - CT head normal - f/u EEG normal  - continue keppra for now but will likely discontinue in the near future -New Klonopin, Requip, Neurontin  Steroid induced hyperglycemia. - SSI  Hx of osteoporosis. - hold outpt  fosamax   Best Practice (right click and "Reselect all SmartList Selections" daily)   Diet/type: tubefeeds DVT prophylaxis: prophylactic heparin  GI prophylaxis: PPI Lines: N/A Foley:  Yes, and it is still needed Code Status:  full code Family: Updated pt's husband at bedside on 05/04/21.  Labs    CMP Latest Ref Rng & Units 05/06/2021 05/15/2021 05/06/2021  Glucose 70 - 99 mg/dL 142(H) 151(H) 212(H)  BUN 8 - 23 mg/dL 91(H) 90(H) 72(H)  Creatinine 0.44 - 1.00 mg/dL 3.15(H) 3.10(H) 2.49(H)  Sodium 135 - 145 mmol/L 142 141 143  Potassium 3.5 - 5.1 mmol/L 5.9(H) 5.8(H) 4.8  Chloride 98 - 111 mmol/L 101 101 102  CO2 22 - 32 mmol/L 31 32 31  Calcium 8.9 - 10.3 mg/dL 8.0(L) 7.9(L) 8.2(L)  Total Protein 6.5 - 8.1 g/dL - 5.0(L) -  Total Bilirubin 0.3 - 1.2 mg/dL - 0.7 -  Alkaline Phos 38 - 126 U/L - 95 -  AST 15 - 41 U/L - 26 -  ALT 0 - 44 U/L - 22 -    CBC Latest Ref Rng & Units 05/20/2021 05/06/2021 05/05/2021  WBC 4.0 - 10.5 K/uL 17.2(H) 18.8(H) 15.0(H)  Hemoglobin 12.0 - 15.0 g/dL 11.0(L) 11.2(L) 11.4(L)  Hematocrit 36.0 - 46.0 % 37.5 38.1 38.8  Platelets 150 - 400 K/uL 190 213 256    ABG    Component Value Date/Time   PHART 7.311 (L) 05/04/2021 0002   PCO2ART 72.7 (HH) 05/04/2021 0002   PO2ART 84 05/04/2021 0002   HCO3 36.7 (H) 05/04/2021 0002   TCO2 39 (H) 05/04/2021 0002   O2SAT 95.0 05/04/2021 0002    CBG (last 3)  Recent Labs    05/06/21 1937 05/06/21 2352 06/03/2021 0351  GLUCAP 154* 129* 134*     Critical care time: [place], D.O.  Internal Medicine Resident, PGY-3 [facility]  Pager: [phone] 6:48 AM, 05/30/2021

## 2021-05-07 NOTE — Progress Notes (Addendum)
Echocardiogram 2D Echocardiogram has been performed.  Oneal Deputy Sindy Mccune RDCS 05/14/2021, 8:40 AM  Dr. Stanford Breed notified at 8:40

## 2021-05-07 NOTE — Progress Notes (Signed)
Pharmacy Antibiotic Note  Lisa Hamilton is a 75 y.o. female admitted on 05/04/2021 with shortness of breath now with respiratory failure and concern for pneumonia.  Pharmacy has been consulted for Vancomycin and Cefepime dosing. Patient has been on CAP therapy with Azithromycin and Ceftriaxone for 4 days (did receive Cefepime and Vancomycin on day #1 so 5 days total therapy). Now re-broadening due to concern for HCAP. Bronchoscopy done and culture sent today.   WBC remains elevated at 17.2. Afebrile.  Worsening AKI noted and hyperkalemia and minimal UOP - CRRT initiating this PM. Patient received a loading dose of Vancomycin on 9/29 (5 days prior) and at that time SCr was 0.56 - SCr has continued to trend up since that time and little to no UOP was recorded.   Plan: Restart Cefepime 2g IV every 12 hours while on CRRT.  Due to little to no UOP recorded since dose given 9/29 - checking Vancomycin level to determine further dosing.   Height: 5' (152.4 cm) Weight: 110.2 kg (242 lb 15.2 oz) IBW/kg (Calculated) : 45.5  Temp (24hrs), Avg:97.6 F (36.4 C), Min:97 F (36.1 C), Max:98.2 F (36.8 C)  Recent Labs  Lab 04/27/2021 1847 05/04/21 0300 05/05/21 0330 05/06/21 0435 05/14/2021 0419 05/22/2021 0546  WBC 9.4 20.2* 15.0* 18.8* 17.2*  --   CREATININE 0.56 0.97 1.69* 2.49* 3.10* 3.15*    Estimated Creatinine Clearance: 17.9 mL/min (A) (by C-G formula based on SCr of 3.15 mg/dL (H)).    Allergies  Allergen Reactions   Hydromorphone Hcl Other (See Comments)    Disorientation and delirium.    Penicillins Rash and Other (See Comments)    PATIENT HAS HAD A PCN REACTION WITH IMMEDIATE RASH, FACIAL/TONGUE/THROAT SWELLING, SOB, OR LIGHTHEADEDNESS WITH HYPOTENSION:  #  #  YES  #  #  Has patient had a PCN reaction causing severe rash involving mucus membranes or skin necrosis: No Has patient had a PCN reaction that required hospitalization: No Has patient had a PCN reaction occurring within the last  10 years: No If all of the above answers are "NO", then may proceed with Cephalosporin use.    Adhesive [Tape] Rash and Other (See Comments)    Blisters The clear tape    Amoxicillin-Pot Clavulanate Rash   Codeine Other (See Comments)    nightmares    Antimicrobials this admission: Vancomycin 9/29 x1; 10/3 >> Cefepime 9/29 x1; 10/3 >> Ceftriaxone 9/30>>10/3 Azithromycin 9/30>>10/3  Dose adjustments this admission:   Microbiology results: 10/3 BAL culture: 9/30, 10/3 Respiratory panel: negative 9/30 Sputum: negative 9/30 MRSA PCR: negative  Thank you for allowing pharmacy to be a part of this patient's care.  Sloan Leiter, PharmD, BCPS, BCCCP Clinical Pharmacist Please refer to [place] for [place] numbers 05/28/2021 5:26 PM

## 2021-05-08 ENCOUNTER — Inpatient Hospital Stay (HOSPITAL_COMMUNITY): Payer: Medicare Other

## 2021-05-08 ENCOUNTER — Telehealth: Payer: Medicare Other

## 2021-05-08 DIAGNOSIS — I3139 Other pericardial effusion (noninflammatory): Secondary | ICD-10-CM

## 2021-05-08 DIAGNOSIS — J9621 Acute and chronic respiratory failure with hypoxia: Secondary | ICD-10-CM | POA: Diagnosis not present

## 2021-05-08 DIAGNOSIS — I3131 Malignant pericardial effusion in diseases classified elsewhere: Secondary | ICD-10-CM | POA: Diagnosis not present

## 2021-05-08 DIAGNOSIS — I469 Cardiac arrest, cause unspecified: Secondary | ICD-10-CM | POA: Diagnosis not present

## 2021-05-08 LAB — RENAL FUNCTION PANEL
Albumin: 2.1 g/dL — ABNORMAL LOW (ref 3.5–5.0)
Albumin: 2.2 g/dL — ABNORMAL LOW (ref 3.5–5.0)
Anion gap: 11 (ref 5–15)
Anion gap: 13 (ref 5–15)
BUN: 78 mg/dL — ABNORMAL HIGH (ref 8–23)
BUN: 68 mg/dL — ABNORMAL HIGH (ref 8–23)
CO2: 29 mmol/L (ref 22–32)
CO2: 25 mmol/L (ref 22–32)
Calcium: 7.3 mg/dL — ABNORMAL LOW (ref 8.9–10.3)
Calcium: 7.3 mg/dL — ABNORMAL LOW (ref 8.9–10.3)
Chloride: 99 mmol/L (ref 98–111)
Chloride: 100 mmol/L (ref 98–111)
Creatinine, Ser: 2.85 mg/dL — ABNORMAL HIGH (ref 0.44–1.00)
Creatinine, Ser: 2.48 mg/dL — ABNORMAL HIGH (ref 0.44–1.00)
GFR, Estimated: 17 mL/min — ABNORMAL LOW (ref >60)
GFR, Estimated: 20 mL/min — ABNORMAL LOW (ref >60)
Glucose, Bld: 107 mg/dL — ABNORMAL HIGH (ref 70–99)
Glucose, Bld: 134 mg/dL — ABNORMAL HIGH (ref 70–99)
Phosphorus: 3.7 mg/dL (ref 2.5–4.6)
Phosphorus: 4.1 mg/dL (ref 2.5–4.6)
Potassium: 5.9 mmol/L — ABNORMAL HIGH (ref 3.5–5.1)
Potassium: 5.6 mmol/L — ABNORMAL HIGH (ref 3.5–5.1)
Sodium: 139 mmol/L (ref 135–145)
Sodium: 138 mmol/L (ref 135–145)

## 2021-05-08 LAB — SEDIMENTATION RATE: Sed Rate: 22 mm/hr (ref 0–22)

## 2021-05-08 LAB — POTASSIUM
Potassium: 5.8 mmol/L — ABNORMAL HIGH (ref 3.5–5.1)
Potassium: 5.7 mmol/L — ABNORMAL HIGH (ref 3.5–5.1)
Potassium: 5.5 mmol/L — ABNORMAL HIGH (ref 3.5–5.1)
Potassium: 5.3 mmol/L — ABNORMAL HIGH (ref 3.5–5.1)

## 2021-05-08 LAB — GLUCOSE, CAPILLARY
Glucose-Capillary: 104 mg/dL — ABNORMAL HIGH (ref 70–99)
Glucose-Capillary: 111 mg/dL — ABNORMAL HIGH (ref 70–99)
Glucose-Capillary: 116 mg/dL — ABNORMAL HIGH (ref 70–99)
Glucose-Capillary: 137 mg/dL — ABNORMAL HIGH (ref 70–99)
Glucose-Capillary: 137 mg/dL — ABNORMAL HIGH (ref 70–99)
Glucose-Capillary: 164 mg/dL — ABNORMAL HIGH (ref 70–99)
Glucose-Capillary: 97 mg/dL (ref 70–99)

## 2021-05-08 LAB — ECHOCARDIOGRAM LIMITED
Height: 60 in
Weight: 3887.15 oz

## 2021-05-08 LAB — CBC
HCT: 35.1 % — ABNORMAL LOW (ref 36.0–46.0)
Hemoglobin: 10.2 g/dL — ABNORMAL LOW (ref 12.0–15.0)
MCH: 29.1 pg (ref 26.0–34.0)
MCHC: 29.1 g/dL — ABNORMAL LOW (ref 30.0–36.0)
MCV: 100.3 fL — ABNORMAL HIGH (ref 80.0–100.0)
Platelets: 204 10*3/uL (ref 150–400)
RBC: 3.5 MIL/uL — ABNORMAL LOW (ref 3.87–5.11)
RDW: 15.1 % (ref 11.5–15.5)
WBC: 18.8 10*3/uL — ABNORMAL HIGH (ref 4.0–10.5)
nRBC: 0.5 % — ABNORMAL HIGH (ref 0.0–0.2)

## 2021-05-08 LAB — MAGNESIUM: Magnesium: 2.4 mg/dL (ref 1.7–2.4)

## 2021-05-08 LAB — MRSA NEXT GEN BY PCR, NASAL: MRSA by PCR Next Gen: NOT DETECTED

## 2021-05-08 LAB — C-REACTIVE PROTEIN: CRP: 5.7 mg/dL — ABNORMAL HIGH (ref <1.0)

## 2021-05-08 MED ORDER — EPINEPHRINE HCL 5 MG/250ML IV SOLN IN NS
0.5000 ug/min | INTRAVENOUS | Status: DC
  Administered 2021-05-08: 2 ug/min via INTRAVENOUS
  Administered 2021-05-09: 5 ug/min via INTRAVENOUS
  Administered 2021-05-10: 3 ug/min via INTRAVENOUS
  Filled 2021-05-08 (×3): qty 250

## 2021-05-08 MED ORDER — ARTIFICIAL TEARS OPHTHALMIC OINT
1.0000 | TOPICAL_OINTMENT | Freq: Three times a day (TID) | OPHTHALMIC | Status: DC
Start: 2021-05-08 — End: 2021-05-10
  Administered 2021-05-08 – 2021-05-09 (×3): 1 via OPHTHALMIC
  Filled 2021-05-08 (×2): qty 3.5

## 2021-05-08 MED ORDER — ALTEPLASE 2 MG IJ SOLR
2.0000 mg | Freq: Once | INTRAMUSCULAR | Status: AC
  Administered 2021-05-08: 2 mg
  Filled 2021-05-08: qty 2

## 2021-05-08 MED ORDER — BUPIVACAINE-EPINEPHRINE (PF) 0.25% -1:200000 IJ SOLN
INTRAMUSCULAR | Status: AC
  Filled 2021-05-08: qty 30

## 2021-05-08 MED ORDER — VANCOMYCIN HCL IN DEXTROSE 1-5 GM/200ML-% IV SOLN
1000.0000 mg | INTRAVENOUS | Status: DC
  Administered 2021-05-09: 1000 mg via INTRAVENOUS
  Filled 2021-05-08: qty 200

## 2021-05-08 MED ORDER — ROCURONIUM BROMIDE 10 MG/ML (PF) SYRINGE
50.0000 mg | PREFILLED_SYRINGE | INTRAVENOUS | Status: DC | PRN
  Administered 2021-05-08 – 2021-05-09 (×5): 50 mg via INTRAVENOUS
  Filled 2021-05-08 (×2): qty 5
  Filled 2021-05-08 (×2): qty 10
  Filled 2021-05-08 (×2): qty 5
  Filled 2021-05-08 (×2): qty 10

## 2021-05-08 MED ORDER — VANCOMYCIN HCL 2000 MG/400ML IV SOLN
2000.0000 mg | Freq: Once | INTRAVENOUS | Status: AC
  Administered 2021-05-08: 2000 mg via INTRAVENOUS
  Filled 2021-05-08: qty 400

## 2021-05-08 MED ORDER — SODIUM CHLORIDE 0.9% FLUSH: 10.0000 mL | INTRAVENOUS | Status: DC | PRN

## 2021-05-08 MED ORDER — LIDOCAINE HCL (PF) 1 % IJ SOLN
INTRAMUSCULAR | Status: AC
  Filled 2021-05-08: qty 30

## 2021-05-08 MED ORDER — PRISMASOL BGK 0/2.5 32-2.5 MEQ/L EC SOLN
Status: DC
  Filled 2021-05-08 (×32): qty 5000

## 2021-05-08 MED ORDER — SENNOSIDES-DOCUSATE SODIUM 8.6-50 MG PO TABS
2.0000 | ORAL_TABLET | Freq: Two times a day (BID) | ORAL | Status: DC
  Administered 2021-05-08 – 2021-05-15 (×16): 2
  Filled 2021-05-08 (×16): qty 2

## 2021-05-08 MED ORDER — SODIUM CHLORIDE 0.9% FLUSH
10.0000 mL | Freq: Two times a day (BID) | INTRAVENOUS | Status: DC
Start: 2021-05-08 — End: 2021-05-17
  Administered 2021-05-08: 20 mL
  Administered 2021-05-08 – 2021-05-16 (×14): 10 mL

## 2021-05-08 NOTE — Progress Notes (Addendum)
NAME:  Lisa Hamilton, MRN:  [id number], DOB:  08/04/1946, LOS: 5 ADMISSION DATE:  04/09/2021, CONSULTATION DATE:  9/29 REFERRING MD:  Dr. Lorin Mercy, CHIEF COMPLAINT:  Cardiac arrest   History of Present Illness:  75 yr old female followed by Dr. Keturah Barre for COPD from asthma and chronic respiratory failure on 2 to 3 liters oxygen developed progressive cough, dyspnea, wheezing, and weakness.  Her husband reports she received COVID vaccine booster 2 days prior to developing symptoms.  She presented to ER on 9/28 and started on Bipap for acute on chronic hypoxic/hypercapnic respiratory failure.  She was started on steroids and antibiotics.  Had initial improvement and transitioned off Bipap per patient request.  Several hours later she then developed bradycardia leading to asystolic cardiac arrest.  Had ROSC after about 15 minutes.  Intubated and transferred to ICU.  Pertinent  Medical History  Anemia, Diverticulosis, DJD, GERD, Glaucoma, Hepatic cyst, Hiatal hernia, Colon polyps, HLD, HTN, Medullary sponge kidney, Goiter, Osteoporosis, SBO, Nephrolithiasis, RLS, Shingles, Vit D deficiency, prediabetes,  Significant Hospital Events: Including procedures, antibiotic start and stop dates in addition to other pertinent events   9/28 Admit 9/29 respiratory leading to cardiac arrest, intubated and transferred to ICU 9/30 tonic-clonic seizure and started on keppra, developed transient A fib and started on amiodarone, started on peripheral phenylephrine, Lt IJ CVL placed 10/1 back in sinus rhythm, off pressors, remains on increased PEEP/FiO2 10/3 - HD cath placed, started CRRT  Interim History / Subjective:  Patient resting in bed. She is intubated and sedated. She is still on amiodarone and appears to be in sinus rhythm. Continues to have blood sputum in BAL.    Objective   Blood pressure (!) 127/59, pulse (!) 45, temperature 98.2 F (36.8 C), resp. rate 20, height 5' (1.524 m), weight 110.2 kg, SpO2  100 %. CVP:  [11 mmHg-17 mmHg] 11 mmHg  Vent Mode: PRVC FiO2 (%):  [90 %-100 %] 90 % Set Rate:  [20 bmp] 20 bmp Vt Set:  [370 mL] 370 mL PEEP:  [10 cmH20] 10 cmH20 Plateau Pressure:  [15 cmH20-28 cmH20] 18 cmH20   Intake/Output Summary (Last 24 hours) at 05/08/2021 0929 Last data filed at 05/08/2021 0900 Gross per 24 hour  Intake 1954.67 ml  Output 547 ml  Net 1407.67 ml   Filed Weights   05/05/21 0500 05/06/21 0500 05/25/2021 0500  Weight: 105.8 kg 108.2 kg 110.2 kg    Examination: General - alert and resting in bed with ET tube in place Eyes - pupils reactive ENT - ETT in place Cardiac - regular rate/rhythm, no murmur Chest - scattered rhonchi, no wheeze Abdomen - soft, non tender, + bowel sounds Extremities - 1+ edema, warm and clammy.  Skin - no rashes Neuro - RASS 0   Patient Lines/Drains/Airways Status     Active Line/Drains/Airways     Name Placement date Placement time Site Days   Peripheral IV 05/03/21 20 G Right Antecubital 05/03/21  1710  Antecubital  3   Peripheral IV 05/04/21 20 G 2.5" Anterior;Left;Proximal;Upper Arm 05/04/21  0415  Arm  2   CVC Triple Lumen 05/04/21 Left Internal jugular 05/04/21  1800  -- 2   NG/OG Vented/Dual Lumen Oral 05/03/21  1900  Oral  3   Urethral Catheter  Temperature probe 05/04/21  0230  Temperature probe  2   Airway 7.5 mm 05/03/21  2310  -- 3   Incision (Closed) 10/15/17 Abdomen 10/15/17  1028  -- 1299  Incision (Closed) 12/04/17 Abdomen 12/04/17  1241  -- 1249   Incision - 5 Ports Abdomen 1: Umbilicus 2: Right 3: Left;Upper 4: Left;Mid 5: Left;Lower 12/04/17  1450  -- 1249   Wound / Incision (Open or Dehisced) 12/10/17 Incision - Open Abdomen Lower;Medial;Right JP drain site 12/10/17  2040  Abdomen  1243                  Resolved Hospital Problem list   Cardiogenic shock  Assessment & Plan:   Acute on chronic hypoxic/hypercapnic respiratory failure in setting of asthma/COPD exacerbation with CAP. Diffuse  Alveolar Hemorrhage  Patient continues to have increasing BAL concerning for DAH. She will need an autoimmune work up. Continue to have high O2 requirements.  - CT pending due to airspace opacity and extrinsic compression of the lung   - adjust PEEP/FiO2 to keep SpO2 > 92% - Finished 5/5 days of rocephin and zithromax, broadening antibiotics to cefepime and vancomycin overnight. - Will continue systemic steroids.  - Will get ANA with reflex, CRP, ESR, ANCA  - Cont yupelri, brovana and continue pulmicort, singulair - prn albuterol  Pericardial Effusion New onset atrial fibrillation with RVR >> back in sinus rhythm on 10/1. Hx of HTN, HLD. Continues to have difficulty with hypotension. Cardiology has been consulted and will hold off on pericardiocentesis as there are no signs of tamponade. She will need a repeat echo to see if effusion is resolving. Will hold off on amiodarone as she remains hypotensive.  - Hold amio and try epi to improve pressure,. - continue ASA, pravachol - Continue to hold home cozaar  Oliguric AKI 2/2 ATN versus cardiorenal syndrome: Patient started CRRT yesterday. Appreciate nephrologies assistance.  - Monitor urine output and kidney function daily - Will aim for euvolemia.   Acute metabolic encephalopathy from hypoxia, hypercapnia. Isolated seizure after cardiac arrest on 9/30. Hx of RLS. - RASS goal 0 to -1 - CT head normal - f/u EEG - continue keppra for now - continue klonopin, neurontin, requip  Steroid induced hyperglycemia. - SSI  Hx of osteoporosis. - hold outpt fosamax  Best Practice (right click and "Reselect all SmartList Selections" daily)   Diet/type: tubefeeds DVT prophylaxis: prophylactic heparin  GI prophylaxis: PPI Lines: N/A Foley:  Yes, and it is still needed Code Status:  full code Family: Updated pt's husband at bedside on 05/04/21.  Labs    CMP Latest Ref Rng & Units 05/08/2021 05/27/2021 05/09/2021  Glucose 70 - 99 mg/dL  107(H) 131(H) -  BUN 8 - 23 mg/dL 78(H) 78(H) -  Creatinine 0.44 - 1.00 mg/dL 2.85(H) 2.78(H) -  Sodium 135 - 145 mmol/L 139 139 -  Potassium 3.5 - 5.1 mmol/L 5.9(H) 5.6(H) 6.0(H)  Chloride 98 - 111 mmol/L 99 100 -  CO2 22 - 32 mmol/L 29 29 -  Calcium 8.9 - 10.3 mg/dL 7.3(L) 7.7(L) -  Total Protein 6.5 - 8.1 g/dL - - -  Total Bilirubin 0.3 - 1.2 mg/dL - - -  Alkaline Phos 38 - 126 U/L - - -  AST 15 - 41 U/L - - -  ALT 0 - 44 U/L - - -    CBC Latest Ref Rng & Units 05/08/2021 05/31/2021 05/06/2021  WBC 4.0 - 10.5 K/uL 18.8(H) 17.2(H) 18.8(H)  Hemoglobin 12.0 - 15.0 g/dL 10.2(L) 11.0(L) 11.2(L)  Hematocrit 36.0 - 46.0 % 35.1(L) 37.5 38.1  Platelets 150 - 400 K/uL 204 190 213    ABG    Component Value  Date/Time   PHART 7.311 (L) 05/04/2021 0002   PCO2ART 72.7 (HH) 05/04/2021 0002   PO2ART 84 05/04/2021 0002   HCO3 36.7 (H) 05/04/2021 0002   TCO2 39 (H) 05/04/2021 0002   O2SAT 95.0 05/04/2021 0002    CBG (last 3)  Recent Labs    05/08/21 0051 05/08/21 0341 05/08/21 0747  GLUCAP 116* 104* 97     Critical care time: N/A     N. , D.O.  Internal Medicine Resident, PGY-3 [facility]  Pager: #[phone] 9:30 AM, 05/08/2021            

## 2021-05-08 NOTE — Progress Notes (Signed)
Progress Note  Patient Name: Lisa Hamilton Date of Encounter: 05/08/2021  [facility] Cardiologist: None   Subjective   Intubated and sedated.    Inpatient Medications    Scheduled Meds:  arformoterol  15 mcg Nebulization BID   B-complex with vitamin C  1 tablet Per Tube Daily   budesonide (PULMICORT) nebulizer solution  0.5 mg Nebulization BID   chlorhexidine gluconate (MEDLINE KIT)  15 mL Mouth Rinse BID   Chlorhexidine Gluconate Cloth  6 each Topical Daily   clonazePAM  1 mg Per Tube QHS   feeding supplement (PROSource TF)  45 mL Per Tube BID   gabapentin  100 mg Per Tube Q8H   insulin aspart  0-20 Units Subcutaneous Q4H   levETIRAcetam  500 mg Per Tube BID   mouth rinse  15 mL Mouth Rinse 10 times per day   montelukast  10 mg Per Tube QPM   pantoprazole sodium  40 mg Per Tube Daily   polyethylene glycol  17 g Per Tube Daily   pravastatin  40 mg Per Tube Daily   predniSONE  40 mg Per Tube Q breakfast   revefenacin  175 mcg Nebulization Daily   rOPINIRole  1 mg Per Tube TID   senna-docusate  2 tablet Per Tube QHS   sodium chloride flush  10-40 mL Intracatheter Q12H   sodium chloride flush  3 mL Intravenous Q12H   tranexamic acid  500 mg Nebulization Q8H   Continuous Infusions:   prismasol BGK 4/2.5 300 mL/hr at 05/16/2021 1554   sodium chloride     azithromycin 500 mg (05/08/21 1028)   ceFEPime (MAXIPIME) IV Stopped (05/08/21 0724)   epinephrine 2 mcg/min (05/08/21 1031)   feeding supplement (VITAL 1.5 CAL) 1,000 mL (05/25/2021 1420)   fentaNYL infusion INTRAVENOUS 125 mcg/hr (05/08/21 1000)   midazolam 1 mg/hr (05/08/21 1000)   norepinephrine (LEVOPHED) Adult infusion 6 mcg/min (05/08/21 1000)   prismasol BGK 2/2.5 dialysis solution 1,500 mL/hr at 05/08/21 0923   prismasol BGK 2/2.5 replacement solution 200 mL/hr at 05/19/2021 1554   propofol (DIPRIVAN) infusion Stopped (05/23/2021 1203)   [START ON 05/09/2021] vancomycin     PRN Meds: acetaminophen **OR**  acetaminophen, albuterol, atropine, bisacodyl, fentaNYL (SUBLIMAZE) injection, fentaNYL (SUBLIMAZE) injection, heparin, hydrALAZINE, midazolam, [DISCONTINUED] ondansetron **OR** ondansetron (ZOFRAN) IV, sodium chloride, sodium chloride flush   Vital Signs    Vitals:   05/08/21 1000 05/08/21 1015 05/08/21 1030 05/08/21 1031  BP: (!) 122/54 (!) 103/48 (!) 102/50   Pulse: (!) 46 (!) 53 (!) 53   Resp: 13 (!) 0 18 20  Temp: (!) 97.3 F (36.3 C) (!) 97.2 F (36.2 C) (!) 97 F (36.1 C)   TempSrc:      SpO2: 95% 98% 95% 95%  Weight:      Height:        Intake/Output Summary (Last 24 hours) at 05/08/2021 1036 Last data filed at 05/08/2021 1000 Gross per 24 hour  Intake 2186.95 ml  Output 578 ml  Net 1608.95 ml   Last 3 Weights 05/06/2021 05/06/2021 05/05/2021  Weight (lbs) 242 lb 15.2 oz 238 lb 8.6 oz 233 lb 4 oz  Weight (kg) 110.2 kg 108.2 kg 105.8 kg      Telemetry    Sinus bradycardia.  PACs.   - Personally Reviewed  ECG    Sinus bradycardia.  Rate 58 bpm.  Low voltage. - Personally Reviewed  Physical Exam   VS:  BP (!) 102/50   Pulse Marland Kitchen)  53   Temp (!) 97 F (36.1 C)   Resp 20   Ht 5' (1.524 m)   Wt 110.2 kg   SpO2 95%   BMI 47.45 kg/m  , BMI Body mass index is 47.45 kg/m. GENERAL: Critically ill-appearing intubated and sedated on. HEENT: Pupils equal round and reactive, fundi not visualized, oral mucosa unremarkable NECK:  No jugular venous distention, waveform within normal limits, carotid upstroke brisk and symmetric, no bruits, no thyromegaly LUNGS: Vented breath sounds.  No crackles or wheezes. HEART:  RRR.  PMI not displaced or sustained,S1 and S2 within normal limits, no S3, no S4, no clicks, no rubs, no murmurs ABD:  Flat, positive bowel sounds normal in frequency in pitch, no bruits, no rebound, no guarding, no midline pulsatile mass, no hepatomegaly, no splenomegaly EXT:  2 plus pulses throughout, bilateral UE edema, no cyanosis no clubbing SKIN:  No rashes  no nodules NEURO: Opens eyes to voice.  Nods head to questions.  Moves all four extremities  PSYCH:  unable to assess  Labs    High Sensitivity Troponin:   Recent Labs  Lab 04/23/2021 1847  TROPONINIHS 10     Chemistry Recent Labs  Lab 04/11/2021 1847 05/03/21 0749 05/05/21 0330 05/06/21 0435 05/30/2021 0419 05/21/2021 0546 06/03/2021 1600 05/30/2021 1726 05/13/2021 2223 05/08/21 0508 05/08/21 0926  NA 141   < > 144   < > 141   < > 140  --  139 139  --   K 4.0   < > 3.8   < > 5.8*   < > 5.9*   < > 5.6* 5.9* 5.8*  CL 99   < > 103   < > 101   < > 98  --  100 99  --   CO2 35*   < > 31   < > 32   < > 31  --  29 29  --   GLUCOSE 145*   < > 207*   < > 151*   < > 136*  --  131* 107*  --   BUN 24*   < > 54*   < > 90*   < > 106*  --  78* 78*  --   CREATININE 0.56   < > 1.69*   < > 3.10*   < > 3.27*  --  2.78* 2.85*  --   CALCIUM 9.4   < > 8.2*   < > 7.9*   < > 7.8*  --  7.7* 7.3*  --   MG  --    < > 2.1  --   --   --   --   --  2.4 2.4  --   PROT 6.8  --   --   --  5.0*  --   --   --   --   --   --   ALBUMIN 3.3*  --   --   --  2.1*  --  2.2*  --   --  2.1*  --   AST 20  --   --   --  26  --   --   --   --   --   --   ALT 19  --   --   --  22  --   --   --   --   --   --   ALKPHOS 105  --   --   --  95  --   --   --   --   --   --  BILITOT 1.3*  --   --   --  0.7  --   --   --   --   --   --   GFRNONAA >60   < > 32*   < > 15*   < > 14*  --  17* 17*  --   ANIONGAP 7   < > 10   < > 8   < > 11  --  10 11  --    < > = values in this interval not displayed.    Lipids No results for input(s): CHOL, TRIG, HDL, LABVLDL, LDLCALC, CHOLHDL in the last 168 hours.  Hematology Recent Labs  Lab 05/06/21 0435 05/13/2021 0419 05/08/21 0508  WBC 18.8* 17.2* 18.8*  RBC 3.79* 3.74* 3.50*  HGB 11.2* 11.0* 10.2*  HCT 38.1 37.5 35.1*  MCV 100.5* 100.3* 100.3*  MCH 29.6 29.4 29.1  MCHC 29.4* 29.3* 29.1*  RDW 14.7 14.9 15.1  PLT 213 190 204   Thyroid No results for input(s): TSH, FREET4 in the last 168  hours.  BNP Recent Labs  Lab 04/24/2021 1508 04/12/2021 1847  BNP  --  132.3*  PROBNP 333*  --     DDimer  Recent Labs  Lab 04/27/2021 1515  DDIMER 3.04*     Radiology    DG Chest 1 View  Result Date: 05/06/2021 CLINICAL DATA:  Endotracheal tube placement EXAM: CHEST  1 VIEW COMPARISON:  05/14/2021 at 12:46 p.m. FINDINGS: Leftward rotation and reverse lordotic projection complicates the assessment. The endotracheal tube tip appears to be about 2.8 cm above the carina, satisfactorily position. A nasogastric tube enters the stomach. Left central line tip projects over the lower SVC. Right internal jugular central line tip also projects over the lower SVC. Continued indistinct airspace opacities at the lung bases with interstitial accentuation and ill definition of the pulmonary vasculature. Atherosclerotic calcification of the aortic arch. Possible mild cardiomegaly. Substantial thoracic spondylosis. IMPRESSION: 1. The endotracheal tube appears satisfactorily positioned, tip 2.8 cm above the carina. The other tubes and lines likewise appear satisfactory where visualized. 2. Continued basilar airspace opacities and interstitial accentuation bilaterally. 3. Possible mild cardiomegaly. 4.  Aortic Atherosclerosis (ICD10-I70.0). 5. Thoracic spondylosis. Electronically Signed   By: Van Clines M.D.   On: 05/28/2021 18:38   US RENAL  Result Date: 05/30/2021 CLINICAL DATA:  AKI EXAM: RENAL / URINARY TRACT ULTRASOUND COMPLETE COMPARISON:  01/12/2014 ultrasound of the abdomen. FINDINGS: Right Kidney: Renal measurements: 13.6 x 6.2 x 7.0 cm = volume: 334 mL. Echogenicity within normal limits. Nonobstructing nephrolithiasis in the right kidney, which measures up to 10 mm. No mass or hydronephrosis visualized. Left Kidney: Evaluation is somewhat limited by body habitus and overlying bowel gas. Renal measurements: 11.2 x 7.0 x 4.8 cm = volume: 195 mL. Echogenicity within normal limits. Nonobstructing  nephrolithiasis in the left kidney, which measures up to 14 mm no mass or hydronephrosis visualized. Bladder: Appears normal for degree of bladder distention. Other: None. IMPRESSION: 1. Nonobstructing nephrolithiasis bilaterally. 2. Otherwise normal bilateral kidneys. Electronically Signed   By: Merilyn Baba M.D.   On: 05/12/2021 14:57   DG Chest Port 1 View  Result Date: 05/08/2021 CLINICAL DATA:  Check endotracheal tube placement EXAM: PORTABLE CHEST 1 VIEW COMPARISON:  05/10/2021 FINDINGS: Cardiac shadow is stable. Endotracheal tube, gastric catheter and left jugular central line are noted in satisfactory position stable from the prior exam. Right temporary dialysis catheter is noted in the distal superior vena cava as  well. Increased vascular congestion is noted when compare with the prior exam. Airspace opacities are noted likely related to edema. IMPRESSION: Increasing CHF. Tubes and lines as described in satisfactory position. Electronically Signed   By: Inez Catalina M.D.   On: 05/08/2021 03:44   DG CHEST PORT 1 VIEW  Result Date: 06/01/2021 CLINICAL DATA:  Central line placement. EXAM: PORTABLE CHEST 1 VIEW COMPARISON:  Earlier same day chest radiograph at 5:25 a.m. FINDINGS: Interval placement of right IJ central venous catheter with the tip projecting at the level of the superior cavoatrial junction. Stable position of the left IJ central venous catheter, terminating at the superior cavoatrial junction. Endotracheal tube tip projects 3.9 cm above the carina. Enteric tube tip is below the diaphragm but not included on the image. Unchanged left lower lobe and diffuse right airspace opacities compared to earlier same day radiograph. No pneumothorax. Heart size is difficult to assess due to adjacent consolidations but appears enlarged, stable. IMPRESSION: 1. Interval placement of right IJ central venous catheter; appropriate positioning. 2. Other support lines and tubes are stable. 3. Unchanged  bilateral airspace opacities. Electronically Signed   By: Ileana Roup M.D.   On: 05/16/2021 13:18   DG Chest Port 1 View  Result Date: 06/01/2021 CLINICAL DATA:  Respiratory failure. EXAM: PORTABLE CHEST 1 VIEW COMPARISON:  Chest radiograph 05/06/2021 FINDINGS: Endotracheal tube tip projects 3.6 cm above the carina. Left IJ central venous catheter tip projects at the level of the superior cavoatrial junction. Enteric tube tip is below the diaphragm but not included on the image. Unchanged retrocardiac left lower lobe opacity and diffuse airspace opacities in the right lung. Small-moderate pleural effusions bilaterally. No pneumothorax. Heart size is difficult to assess given adjacent consolidations. IMPRESSION: 1. Unchanged left lower lobe and diffuse right airspace opacities, which could represent combination of small-moderate pleural fluid and atelectasis versus multifocal pneumonia. 2. Support lines and tubes are stable. Electronically Signed   By: Ileana Roup M.D.   On: 06/04/2021 08:19   ECHOCARDIOGRAM LIMITED  Result Date: 05/16/2021    ECHOCARDIOGRAM LIMITED REPORT   Patient Name:   Lisa Hamilton Date of Exam: 06/01/2021 Medical Rec #:  [id number]    Height:       60.0 in Accession #:    [id number]   Weight:       242.9 lb Date of Birth:  06-27-1946   BSA:          2.027 m Patient Age:    75 years     BP:           113/60 mmHg Patient Gender: F            HR:           59 bpm. Exam Location:  Inpatient Procedure: Limited Echo, Color Doppler and Cardiac Doppler Indications:    I31.3 Pericardial effusion  History:        Patient has prior history of Echocardiogram examinations, most                 recent 05/05/2021. Risk Factors:Hypertension and Dyslipidemia.  Sonographer:    Raquel Sarna Senior RDCS Referring Phys: Martin  1. Large pericardial effusion (largest in posterior distribution); mild RA collapse but no RV collapse; no respirtaory flow variation; IVC dilated but pt on  ventilator; no obvious tamponade physiology; findings similar to 05/05/21.  2. Left ventricular ejection fraction, by estimation, is 55 to 60%. The left ventricle has normal function.  The left ventricle has no regional wall motion abnormalities.  3. Right ventricular systolic function is normal. The right ventricular size is normal.  4. Large pericardial effusion.  5. The mitral valve is normal in structure.  6. The aortic valve is tricuspid. Mild aortic valve sclerosis is present, with no evidence of aortic valve stenosis. FINDINGS  Left Ventricle: Left ventricular ejection fraction, by estimation, is 55 to 60%. The left ventricle has normal function. The left ventricle has no regional wall motion abnormalities. Right Ventricle: The right ventricular size is normal. Right ventricular systolic function is normal. Left Atrium: Left atrial size was normal in size. Right Atrium: Right atrial size was normal in size. Pericardium: A large pericardial effusion is present. Mitral Valve: The mitral valve is normal in structure. Mild mitral annular calcification. Aortic Valve: The aortic valve is tricuspid. Mild aortic valve sclerosis is present, with no evidence of aortic valve stenosis. Additional Comments: Large pericardial effusion (largest in posterior distribution); mild RA collapse but no RV collapse; no respirtaory flow variation; IVC dilated but pt on ventilator; no obvious tamponade physiology; findings similar to 05/05/21. Kirk Ruths MD Electronically signed by Kirk Ruths MD Signature Date/Time: 05/13/2021/8:56:15 AM    Final     Cardiac Studies   Echo 05/05/21: IMPRESSIONS    1. Left ventricular ejection fraction, by estimation, is 60 to 65%. The  left ventricle has normal function. The left ventricle has no regional  wall motion abnormalities. Left ventricular diastolic parameters are  indeterminate.   2. Right ventricular systolic function is normal. The right ventricular  size is normal.  Tricuspid regurgitation signal is inadequate for assessing  PA pressure.   3. The mitral valve is normal in structure. No evidence of mitral valve  regurgitation. No evidence of mitral stenosis.   4. The aortic valve is tricuspid. Aortic valve regurgitation is not  visualized. Mild aortic valve sclerosis is present, with no evidence of  aortic valve stenosis.   5. The inferior vena cava is normal in size with greater than 50%  respiratory variability, suggesting right atrial pressure of 3 mmHg.   6. Large pericardial effusion. The pericardial effusion is  circumferential. The effusion measures 2.42cm at greatest diameter  posteriorly. There is RA inversion but no RV diastolic collapse and the  IVC is normal size and collapses > 50% with expiration.   There is normal MV inflow pattern with respirations. No findings of  impending tamponade but this is a clinical diagnosis made at the bedside.   Echo 05/26/2021: IMPRESSIONS     1. Large pericardial effusion (largest in posterior distribution); mild  RA collapse but no RV collapse; no respirtaory flow variation; IVC dilated  but pt on ventilator; no obvious tamponade physiology; findings similar to  05/05/21.   2. Left ventricular ejection fraction, by estimation, is 55 to 60%. The  left ventricle has normal function. The left ventricle has no regional  wall motion abnormalities.   3. Right ventricular systolic function is normal. The right ventricular  size is normal.   4. Large pericardial effusion.   5. The mitral valve is normal in structure.   6. The aortic valve is tricuspid. Mild aortic valve sclerosis is present,  with no evidence of aortic valve stenosis.   Patient Profile     75 y.o. female with COPD, hypertension, hyperlipidemia, and GERD admitted with acute respiratory failure.  She subsequently developed bradycardia and asystolic arrest.  She was intubated and resuscitated for 15 minutes.  She  had a seizure on 9/30 and  transient atrial fibrillation.  Cardiology was consulted for pericardial effusion.  Assessment & Plan    # Pericardial effusion: Over the weekend and plans were made for pericardiocentesis.  She has a large pericardial effusion that is mostly posterior.  No evidence of tamponade and it was smaller yesterday than prior.  On review by the Cath Lab team it was not thought to be favorable for pericardiocentesis.  She remains hypotensive and requiring pressors.  She has been bradycardic when they attempt to remove fluid with CVVHD.  This is not consistent with tamponade or hemodynamic stress from volume removal, as tachycardia would be expected.  Primary team repeating limited echo today.  Suspect that her bradycardia is related to her underlying critical illness and amiodarone rather than the effusion.  Agree with trying epinephrine.   #Paroxysmal atrial fibrillation: Noted transiently in the setting of hypoxic respiratory failure and cardiac arrest.  Now sinus bradycardia with PACs.  Amiodarone on hold as above.      #AKI: Now on CVVHD.  Unable to remove volume as above.    Total critical care time: 40 minutes. Critical care time was exclusive of separately billable procedures and treating other patients. Critical care was necessary to treat or prevent imminent or life-threatening deterioration. Critical care was time spent personally by me on the following activities: development of treatment plan with patient and/or surrogate as well as nursing, discussions with consultants, evaluation of patient's response to treatment, examination of patient, obtaining history from patient or surrogate, ordering and performing treatments and interventions, ordering and review of laboratory studies, ordering and review of radiographic studies, pulse oximetry and re-evaluation of patient's condition.    For questions or updates, please contact [place] Please consult [email or web address] for contact info under         Signed, Skeet Latch, MD  05/08/2021, 10:36 AM

## 2021-05-08 NOTE — Progress Notes (Signed)
Pharmacy Antibiotic Note  Lisa Hamilton is a 75 y.o. female admitted on 05/01/2021 with shortness of breath now with respiratory failure and concern for pneumonia.  Pharmacy has been consulted for Vancomycin and Cefepime dosing. Patient has been on CAP therapy with Azithromycin and Ceftriaxone for 4 days (did receive Cefepime and Vancomycin on day #1 so 5 days total therapy). Now re-broadening due to concern for HCAP. Bronchoscopy done and culture sent today.   WBC remains elevated at 17.2. Afebrile.  Worsening AKI noted and hyperkalemia and minimal UOP - CRRT initiating this PM. Patient received a loading dose of Vancomycin on 9/29 (5 days prior) and at that time SCr was 0.56 - SCr has continued to trend up since that time and little to no UOP was recorded.   S/p BAL on 10/3  Vanc level of 6 - will reload and start vanc for CRRT  Plan: Vanc 2000 mg IV x 1 then 1000 mg IV daily while on CRRT Monitor dialysis and C&S  Height: 5' (152.4 cm) Weight: 110.2 kg (242 lb 15.2 oz) IBW/kg (Calculated) : 45.5  Temp (24hrs), Avg:97.6 F (36.4 C), Min:95.4 F (35.2 C), Max:100.4 F (38 C)  Recent Labs  Lab 05/04/21 0300 05/05/21 0330 05/06/21 0435 05/21/2021 0419 05/22/2021 0546 05/30/2021 1600 05/30/2021 1828 05/31/2021 2223 05/08/21 0508  WBC 20.2* 15.0* 18.8* 17.2*  --   --   --   --  18.8*  CREATININE 0.97 1.69* 2.49* 3.10* 3.15* 3.27*  --  2.78* 2.85*  VANCORANDOM  --   --   --   --   --   --  6  --   --      Estimated Creatinine Clearance: 19.8 mL/min (A) (by C-G formula based on SCr of 2.85 mg/dL (H)).    Allergies  Allergen Reactions   Hydromorphone Hcl Other (See Comments)    Disorientation and delirium.    Penicillins Rash and Other (See Comments)    PATIENT HAS HAD A PCN REACTION WITH IMMEDIATE RASH, FACIAL/TONGUE/THROAT SWELLING, SOB, OR LIGHTHEADEDNESS WITH HYPOTENSION:  #  #  YES  #  #  Has patient had a PCN reaction causing severe rash involving mucus membranes or skin necrosis:  No Has patient had a PCN reaction that required hospitalization: No Has patient had a PCN reaction occurring within the last 10 years: No If all of the above answers are "NO", then may proceed with Cephalosporin use.    Adhesive [Tape] Rash and Other (See Comments)    Blisters The clear tape    Amoxicillin-Pot Clavulanate Rash   Codeine Other (See Comments)    nightmares    Antimicrobials this admission: Vancomycin 9/29 x1; 10/3 >> Cefepime 9/29 x1; 10/3 >> Ceftriaxone 9/30>>10/3 Azithromycin 9/30>>10/3  Dose adjustments this admission:   Microbiology results: 10/3 BAL culture: 9/30, 10/3 Respiratory panel: negative 9/30 Sputum: negative 9/30 MRSA PCR: negative  Thank you for allowing pharmacy to be a part of this patient's care.  Alanda Slim, PharmD, [facility] Clinical Pharmacist Please see AMION for all Pharmacists' Contact Phone Numbers 05/08/2021, 10:05 AM

## 2021-05-08 NOTE — Progress Notes (Signed)
Pt transported to and from CT scan on the ventilator. 

## 2021-05-08 NOTE — Progress Notes (Signed)
[facility] Progress Note Patient Name: Lisa Hamilton DOB: Apr 29, 1946 MRN: [id number]   Date of Service  05/08/2021  HPI/Events of Note  Radiology called with report of pneumothorax.  eICU Interventions  I verified that Dr. Tamala Julian had already placed a pigtail chest tube.        Kerry Kass Zaiya Annunziato 05/08/2021, 7:58 PM

## 2021-05-08 NOTE — Progress Notes (Signed)
  Echocardiogram 2D Echocardiogram has been performed.  Merrie Roof F 05/08/2021, 5:00 PM

## 2021-05-08 NOTE — Progress Notes (Signed)
Pt proned position w/o complication. RT x 1 RN x 4. Pt is currently sat 92%. Suction catheter passes w/o resistance. RT will cont to monitor.

## 2021-05-08 NOTE — Progress Notes (Signed)
[facility] Progress Note   Subjective:   Diff ventilating yesterday afternoon - BAL with DAH noted.  HD catheter required tPA yesterday, running ok now.  BP down, requiring NE now.   Objective Vitals:   05/08/21 0715 05/08/21 0730 05/08/21 0745 05/08/21 0800  BP: (!) 115/53  (!) 99/52 (!) 109/52  Pulse: (!) 56 (!) 56 (!) 57 (!) 55  Resp: _0 Temp: 99.3 F (37.4 C) 99.3 F (37.4 C) 99.1 F (37.3 C) 99.1 F (37.3 C)  TempSrc:    Bladder  SpO2: 100% 99% 98% 99%  Weight:      Height:       Physical Exam General: intubated, sedated Heart: sinus brady, no rub on exam Lungs: coarse, FiO2 90% Abdomen: soft, obese Extremities: no edema Dialysis Access:  RIJ temp HD cath  Additional Objective Labs: Basic Metabolic Panel: Recent Labs  Lab 05/06/21 0435 05/05/2021 0419 05/21/2021 1600 05/08/2021 1726 05/19/2021 2223 05/08/21 0508  NA 143   < > 140  --  139 139  K 4.8   < > 5.9* 6.0* 5.6* 5.9*  CL 102   < > 98  --  100 99  CO2 31   < > 31  --  29 29  GLUCOSE 212*   < > 136*  --  131* 107*  BUN 72*   < > 106*  --  78* 78*  CREATININE 2.49*   < > 3.27*  --  2.78* 2.85*  CALCIUM 8.2*   < > 7.8*  --  7.7* 7.3*  PHOS 3.5  --  4.4  --   --  3.7   < > = values in this interval not displayed.   Liver Function Tests: Recent Labs  Lab 04/25/2021 1847 05/12/2021 0419 05/29/2021 1600 05/08/21 0508  AST 20 26  --   --   ALT 19 22  --   --   ALKPHOS 105 95  --   --   BILITOT 1.3* 0.7  --   --   PROT 6.8 5.0*  --   --   ALBUMIN 3.3* 2.1* 2.2* 2.1*   No results for input(s): LIPASE, AMYLASE in the last 168 hours. CBC: Recent Labs  Lab 04/12/2021 1508 04/17/2021 1847 05/03/21 0749 05/04/21 0300 05/05/21 0330 05/06/21 0435 06/01/2021 0419 05/08/21 0508  WBC 9.4 9.4  --  20.2* 15.0* 18.8* 17.2* 18.8*  NEUTROABS 7.5 8.3*  --   --  14.1*  --   --   --   HGB 11.9* 12.0   < > 13.5 11.4* 11.2* 11.0* 10.2*  HCT 38.9 42.7   < > 47.9* 38.8 38.1 37.5 35.1*  MCV 96.1  105.7*  --  104.1* 99.7 100.5* 100.3* 100.3*  PLT 392.0 365  --  356 256 213 190 204   < > = values in this interval not displayed.   Blood Culture    Component Value Date/Time   SDES BRONCHIAL ALVEOLAR LAVAGE 05/15/2021 1721   SPECREQUEST NONE 06/01/2021 1721   CULT  05/18/2021 1721    CULTURE REINCUBATED FOR BETTER GROWTH Performed at [facility] [address], [place], [place] [address]    REPTSTATUS PENDING 05/23/2021 1721    Cardiac Enzymes: No results for input(s): CKTOTAL, CKMB, CKMBINDEX, TROPONINI in the last 168 hours. CBG: Recent Labs  Lab 05/25/2021 1553 06/01/2021 1928 05/08/21 0051 05/08/21 0341 05/08/21 0747  GLUCAP 129* 111* 116* 104* 97   Iron Studies: No  results for input(s): IRON, TIBC, TRANSFERRIN, FERRITIN in the last 72 hours. _0 @ Studies/Results: DG Chest 1 View  Result Date: 05/20/2021 CLINICAL DATA:  Endotracheal tube placement EXAM: CHEST  1 VIEW COMPARISON:  05/10/2021 at 12:46 p.m. FINDINGS: Leftward rotation and reverse lordotic projection complicates the assessment. The endotracheal tube tip appears to be about 2.8 cm above the carina, satisfactorily position. A nasogastric tube enters the stomach. Left central line tip projects over the lower SVC. Right internal jugular central line tip also projects over the lower SVC. Continued indistinct airspace opacities at the lung bases with interstitial accentuation and ill definition of the pulmonary vasculature. Atherosclerotic calcification of the aortic arch. Possible mild cardiomegaly. Substantial thoracic spondylosis. IMPRESSION: 1. The endotracheal tube appears satisfactorily positioned, tip 2.8 cm above the carina. The other tubes and lines likewise appear satisfactory where visualized. 2. Continued basilar airspace opacities and interstitial accentuation bilaterally. 3. Possible mild cardiomegaly. 4.  Aortic Atherosclerosis (ICD10-I70.0). 5. Thoracic spondylosis. Electronically Signed    By: Van Clines M.D.   On: 05/22/2021 18:38   US RENAL  Result Date: 05/21/2021 CLINICAL DATA:  AKI EXAM: RENAL / URINARY TRACT ULTRASOUND COMPLETE COMPARISON:  01/12/2014 ultrasound of the abdomen. FINDINGS: Right Kidney: Renal measurements: 13.6 x 6.2 x 7.0 cm = volume: 334 mL. Echogenicity within normal limits. Nonobstructing nephrolithiasis in the right kidney, which measures up to 10 mm. No mass or hydronephrosis visualized. Left Kidney: Evaluation is somewhat limited by body habitus and overlying bowel gas. Renal measurements: 11.2 x 7.0 x 4.8 cm = volume: 195 mL. Echogenicity within normal limits. Nonobstructing nephrolithiasis in the left kidney, which measures up to 14 mm no mass or hydronephrosis visualized. Bladder: Appears normal for degree of bladder distention. Other: None. IMPRESSION: 1. Nonobstructing nephrolithiasis bilaterally. 2. Otherwise normal bilateral kidneys. Electronically Signed   By: Merilyn Baba M.D.   On: 05/29/2021 14:57   DG Chest Port 1 View  Result Date: 05/08/2021 CLINICAL DATA:  Check endotracheal tube placement EXAM: PORTABLE CHEST 1 VIEW COMPARISON:  05/21/2021 FINDINGS: Cardiac shadow is stable. Endotracheal tube, gastric catheter and left jugular central line are noted in satisfactory position stable from the prior exam. Right temporary dialysis catheter is noted in the distal superior vena cava as well. Increased vascular congestion is noted when compare with the prior exam. Airspace opacities are noted likely related to edema. IMPRESSION: Increasing CHF. Tubes and lines as described in satisfactory position. Electronically Signed   By: Inez Catalina M.D.   On: 05/08/2021 03:44   DG CHEST PORT 1 VIEW  Result Date: 05/06/2021 CLINICAL DATA:  Central line placement. EXAM: PORTABLE CHEST 1 VIEW COMPARISON:  Earlier same day chest radiograph at 5:25 a.m. FINDINGS: Interval placement of right IJ central venous catheter with the tip projecting at the level of the  superior cavoatrial junction. Stable position of the left IJ central venous catheter, terminating at the superior cavoatrial junction. Endotracheal tube tip projects 3.9 cm above the carina. Enteric tube tip is below the diaphragm but not included on the image. Unchanged left lower lobe and diffuse right airspace opacities compared to earlier same day radiograph. No pneumothorax. Heart size is difficult to assess due to adjacent consolidations but appears enlarged, stable. IMPRESSION: 1. Interval placement of right IJ central venous catheter; appropriate positioning. 2. Other support lines and tubes are stable. 3. Unchanged bilateral airspace opacities. Electronically Signed   By: Ileana Roup M.D.   On: 05/21/2021 13:18   DG Chest [facility] [address]  Result Date: 06/01/2021 CLINICAL DATA:  Respiratory failure. EXAM: PORTABLE CHEST 1 VIEW COMPARISON:  Chest radiograph 05/06/2021 FINDINGS: Endotracheal tube tip projects 3.6 cm above the carina. Left IJ central venous catheter tip projects at the level of the superior cavoatrial junction. Enteric tube tip is below the diaphragm but not included on the image. Unchanged retrocardiac left lower lobe opacity and diffuse airspace opacities in the right lung. Small-moderate pleural effusions bilaterally. No pneumothorax. Heart size is difficult to assess given adjacent consolidations. IMPRESSION: 1. Unchanged left lower lobe and diffuse right airspace opacities, which could represent combination of small-moderate pleural fluid and atelectasis versus multifocal pneumonia. 2. Support lines and tubes are stable. Electronically Signed   By: Ileana Roup M.D.   On: 05/05/2021 08:19   ECHOCARDIOGRAM LIMITED  Result Date: 05/26/2021    ECHOCARDIOGRAM LIMITED REPORT   Patient Name:   Lisa Hamilton Date of Exam: 06/04/2021 Medical Rec #:  [id number]    Height:       60.0 in Accession #:    [id number]   Weight:       242.9 lb Date of Birth:  12-06-1945   BSA:          2.027 m Patient  Age:    75 years     BP:           113/60 mmHg Patient Gender: F            HR:           59 bpm. Exam Location:  Inpatient Procedure: Limited Echo, Color Doppler and Cardiac Doppler Indications:    I31.3 Pericardial effusion  History:        Patient has prior history of Echocardiogram examinations, most                 recent 05/05/2021. Risk Factors:Hypertension and Dyslipidemia.  Sonographer:    Raquel Sarna Senior RDCS Referring Phys: [place]  1. Large pericardial effusion (largest in posterior distribution); mild RA collapse but no RV collapse; no respirtaory flow variation; IVC dilated but pt on ventilator; no obvious tamponade physiology; findings similar to 05/05/21.  2. Left ventricular ejection fraction, by estimation, is 55 to 60%. The left ventricle has normal function. The left ventricle has no regional wall motion abnormalities.  3. Right ventricular systolic function is normal. The right ventricular size is normal.  4. Large pericardial effusion.  5. The mitral valve is normal in structure.  6. The aortic valve is tricuspid. Mild aortic valve sclerosis is present, with no evidence of aortic valve stenosis. FINDINGS  Left Ventricle: Left ventricular ejection fraction, by estimation, is 55 to 60%. The left ventricle has normal function. The left ventricle has no regional wall motion abnormalities. Right Ventricle: The right ventricular size is normal. Right ventricular systolic function is normal. Left Atrium: Left atrial size was normal in size. Right Atrium: Right atrial size was normal in size. Pericardium: A large pericardial effusion is present. Mitral Valve: The mitral valve is normal in structure. Mild mitral annular calcification. Aortic Valve: The aortic valve is tricuspid. Mild aortic valve sclerosis is present, with no evidence of aortic valve stenosis. Additional Comments: Large pericardial effusion (largest in posterior distribution); mild RA collapse but no RV collapse; no  respirtaory flow variation; IVC dilated but pt on ventilator; no obvious tamponade physiology; findings similar to 05/05/21. Kirk Ruths MD Electronically signed by Kirk Ruths MD Signature Date/Time: 05/20/2021/8:56:15 AM    Final    Medications:  prismasol BGK 4/2.5 300 mL/hr at 06/04/2021 1554   sodium chloride     azithromycin Stopped (05/23/2021 1310)   ceFEPime (MAXIPIME) IV Stopped (05/08/21 0724)   feeding supplement (VITAL 1.5 CAL) 1,000 mL (05/27/2021 1420)   fentaNYL infusion INTRAVENOUS 150 mcg/hr (05/08/21 0800)   midazolam 1 mg/hr (05/08/21 0800)   norepinephrine (LEVOPHED) Adult infusion 8 mcg/min (05/08/21 0800)   prismasol BGK 2/2.5 replacement solution 200 mL/hr at 05/09/2021 1554   prismasol BGK 4/2.5 1,500 mL/hr at 05/10/2021 2317   propofol (DIPRIVAN) infusion Stopped (05/25/2021 1203)   vancomycin 2,000 mg (05/08/21 0821)    amiodarone  200 mg Per Tube BID   arformoterol  15 mcg Nebulization BID   aspirin  81 mg Per Tube QHS   B-complex with vitamin C  1 tablet Per Tube Daily   budesonide (PULMICORT) nebulizer solution  0.5 mg Nebulization BID   chlorhexidine gluconate (MEDLINE KIT)  15 mL Mouth Rinse BID   Chlorhexidine Gluconate Cloth  6 each Topical Daily   clonazePAM  1 mg Per Tube QHS   feeding supplement (PROSource TF)  45 mL Per Tube BID   gabapentin  100 mg Per Tube Q8H   insulin aspart  0-20 Units Subcutaneous Q4H   levETIRAcetam  500 mg Per Tube BID   mouth rinse  15 mL Mouth Rinse 10 times per day   montelukast  10 mg Per Tube QPM   pantoprazole sodium  40 mg Per Tube Daily   polyethylene glycol  17 g Per Tube Daily   pravastatin  40 mg Per Tube Daily   predniSONE  40 mg Per Tube Q breakfast   revefenacin  175 mcg Nebulization Daily   rOPINIRole  1 mg Per Tube TID   senna-docusate  2 tablet Per Tube QHS   sodium chloride flush  10-40 mL Intracatheter Q12H   sodium chloride flush  3 mL Intravenous Q12H   tranexamic acid  500 mg Nebulization Q8H    vancomycin variable dose per unstable renal function (pharmacist dosing)   Does not apply See admin instructions    Assessment/Plan **AHRF, multifocal PNA: on significant vent support per primary currently. Steroids, empiric abx. Volume management with CRRT - would like at least net even today but will require use of vasopressor support.   Eval for DAH/alveolitis per primary - serologies and chest CT planned.    **AKI, severe, oliguric:  Multifactorial with contributions of contrast, hypotension > ATN.  Developed hyperkalemia, concern for AMS from uremia and was started on CRRT 10/3.   Renal US 10/3 with BL nonobstructing stones.  UA with hematuria - foley sample.  With DAH noted c/f vasculitis but her presenting creatinine was normal and her course of AKI is more consistent with hemodynamic insults and contrast nephropathy than a GN; I do not think a kidney biopsy is indicated nor is she stable enough for such.  --Cont CRRT today  -- Will attempt gentle UF for net negative 50m/hr or at least net even today --Avoid nephrotoxins and hypotension as able   **Hyperkalemia:  persistent, change to 2K dialysate and follow.    **A fib: on amiodarone; in SB currently.  Cardiology following.    **Pericardial effusion:  no tamponade and smaller on exam 10/3.  The effusion preceeded AKI/significant BUN elevation so not consistent with uremic etiology.   LJannifer HickMD 05/08/2021, 8:30 AM  [facility] Pager: [phone]

## 2021-05-08 NOTE — Progress Notes (Signed)
Chest CT reviewed. Small PTX on L Dense dependent consolidations Will place pigtail on L given risk for further expansion and tenuous cardiopulmonary status and initiate pronation therapy.  Discussed with husband on phone, permission for above obtained. Continue to try to pull fluid.  Erskine Emery MD PCCM

## 2021-05-08 NOTE — Procedures (Signed)
Insertion of Chest Tube Procedure Note  Lisa Hamilton  [id number]  05/19/1946  Date:05/08/21  Time:5:40 PM    Provider Performing: Candee Furbish   Procedure: Pleural Catheter Insertion w/ Imaging Guidance [phone])  Indication(s) Pneumothorax  Consent Risks of the procedure as well as the alternatives and risks of each were explained to the patient and/or caregiver.  Consent for the procedure was obtained and is signed in the bedside chart  Anesthesia Topical only with 1% lidocaine    Time Out Verified patient identification, verified procedure, site/side was marked, verified correct patient position, special equipment/implants available, medications/allergies/relevant history reviewed, required imaging and test results available.   Sterile Technique Maximal sterile technique including full sterile barrier drape, hand hygiene, sterile gown, sterile gloves, mask, hair covering, sterile ultrasound probe cover (if used).   Procedure Description Ultrasound used to identify appropriate pleural anatomy for placement and overlying skin marked. Area of placement cleaned and draped in sterile fashion.  A 14 French pigtail pleural catheter was placed into the left pleural space using Seldinger technique. Appropriate return of air was obtained.  The tube was connected to atrium and placed on -20 cm H2O wall suction.   Complications/Tolerance None; patient tolerated the procedure well. Chest X-ray is ordered to verify placement.   EBL Minimal  Specimen(s) none

## 2021-05-08 NOTE — Progress Notes (Signed)
HD cath de-clotting: No blood return from venous lumen after 2 hour tPA dwell time. Brisk blood return noted from saline lock and arterial lumen. Recommend leaving for longer dwell.

## 2021-05-08 NOTE — Progress Notes (Signed)
Notified Smith, MD of patient's HR dropping to low 40s. Sedation was decreased and CRRT was made even. Patient's HR decrease was correlating to the CRRT machine trying to achieve -50.  MD ordered to keep patient even for now.

## 2021-05-09 ENCOUNTER — Inpatient Hospital Stay (HOSPITAL_COMMUNITY): Payer: Medicare Other

## 2021-05-09 DIAGNOSIS — E7800 Pure hypercholesterolemia, unspecified: Secondary | ICD-10-CM

## 2021-05-09 DIAGNOSIS — R001 Bradycardia, unspecified: Secondary | ICD-10-CM

## 2021-05-09 DIAGNOSIS — J9621 Acute and chronic respiratory failure with hypoxia: Secondary | ICD-10-CM | POA: Diagnosis not present

## 2021-05-09 DIAGNOSIS — I469 Cardiac arrest, cause unspecified: Secondary | ICD-10-CM | POA: Diagnosis not present

## 2021-05-09 DIAGNOSIS — I10 Essential (primary) hypertension: Secondary | ICD-10-CM | POA: Diagnosis not present

## 2021-05-09 LAB — POCT I-STAT 7, (LYTES, BLD GAS, ICA,H+H)
Acid-Base Excess: 2 mmol/L (ref 0.0–2.0)
Bicarbonate: 27.6 mmol/L (ref 20.0–28.0)
Calcium, Ion: 1.05 mmol/L — ABNORMAL LOW (ref 1.15–1.40)
HCT: 32 % — ABNORMAL LOW (ref 36.0–46.0)
Hemoglobin: 10.9 g/dL — ABNORMAL LOW (ref 12.0–15.0)
O2 Saturation: 97 %
Patient temperature: 35.5
Potassium: 5 mmol/L (ref 3.5–5.1)
Sodium: 138 mmol/L (ref 135–145)
TCO2: 29 mmol/L (ref 22–32)
pCO2 arterial: 45 mmHg (ref 32.0–48.0)
pH, Arterial: 7.388 (ref 7.350–7.450)
pO2, Arterial: 87 mmHg (ref 83.0–108.0)

## 2021-05-09 LAB — GLUCOSE, CAPILLARY
Glucose-Capillary: 117 mg/dL — ABNORMAL HIGH (ref 70–99)
Glucose-Capillary: 159 mg/dL — ABNORMAL HIGH (ref 70–99)
Glucose-Capillary: 199 mg/dL — ABNORMAL HIGH (ref 70–99)
Glucose-Capillary: 256 mg/dL — ABNORMAL HIGH (ref 70–99)
Glucose-Capillary: 277 mg/dL — ABNORMAL HIGH (ref 70–99)
Glucose-Capillary: 215 mg/dL — ABNORMAL HIGH (ref 70–99)

## 2021-05-09 LAB — RENAL FUNCTION PANEL
Albumin: 2.2 g/dL — ABNORMAL LOW (ref 3.5–5.0)
Albumin: 1.9 g/dL — ABNORMAL LOW (ref 3.5–5.0)
Anion gap: 10 (ref 5–15)
Anion gap: 10 (ref 5–15)
BUN: 57 mg/dL — ABNORMAL HIGH (ref 8–23)
BUN: 63 mg/dL — ABNORMAL HIGH (ref 8–23)
CO2: 27 mmol/L (ref 22–32)
CO2: 25 mmol/L (ref 22–32)
Calcium: 7.4 mg/dL — ABNORMAL LOW (ref 8.9–10.3)
Calcium: 7.4 mg/dL — ABNORMAL LOW (ref 8.9–10.3)
Chloride: 99 mmol/L (ref 98–111)
Chloride: 100 mmol/L (ref 98–111)
Creatinine, Ser: 2.17 mg/dL — ABNORMAL HIGH (ref 0.44–1.00)
Creatinine, Ser: 2.42 mg/dL — ABNORMAL HIGH (ref 0.44–1.00)
GFR, Estimated: 23 mL/min — ABNORMAL LOW (ref >60)
GFR, Estimated: 21 mL/min — ABNORMAL LOW (ref >60)
Glucose, Bld: 145 mg/dL — ABNORMAL HIGH (ref 70–99)
Glucose, Bld: 259 mg/dL — ABNORMAL HIGH (ref 70–99)
Phosphorus: 4.3 mg/dL (ref 2.5–4.6)
Phosphorus: 5.4 mg/dL — ABNORMAL HIGH (ref 2.5–4.6)
Potassium: 4.7 mmol/L (ref 3.5–5.1)
Potassium: 5.7 mmol/L — ABNORMAL HIGH (ref 3.5–5.1)
Sodium: 136 mmol/L (ref 135–145)
Sodium: 135 mmol/L (ref 135–145)

## 2021-05-09 LAB — CULTURE, RESPIRATORY W GRAM STAIN: Culture: NORMAL

## 2021-05-09 LAB — POTASSIUM
Potassium: 4.8 mmol/L (ref 3.5–5.1)
Potassium: 4.7 mmol/L (ref 3.5–5.1)
Potassium: 5.3 mmol/L — ABNORMAL HIGH (ref 3.5–5.1)
Potassium: 5.7 mmol/L — ABNORMAL HIGH (ref 3.5–5.1)
Potassium: 4.9 mmol/L (ref 3.5–5.1)

## 2021-05-09 LAB — ANCA PROFILE
Anti-MPO Antibodies: <0.2 units (ref 0.0–0.9)
Anti-PR3 Antibodies: <0.2 units (ref 0.0–0.9)
Atypical P-ANCA titer: <1:20 {titer}
C-ANCA: <1:20 {titer}
P-ANCA: <1:20 {titer}

## 2021-05-09 LAB — CBC
HCT: 35.5 % — ABNORMAL LOW (ref 36.0–46.0)
Hemoglobin: 10.4 g/dL — ABNORMAL LOW (ref 12.0–15.0)
MCH: 29.5 pg (ref 26.0–34.0)
MCHC: 29.3 g/dL — ABNORMAL LOW (ref 30.0–36.0)
MCV: 100.9 fL — ABNORMAL HIGH (ref 80.0–100.0)
Platelets: 163 10*3/uL (ref 150–400)
RBC: 3.52 MIL/uL — ABNORMAL LOW (ref 3.87–5.11)
RDW: 15.2 % (ref 11.5–15.5)
WBC: 18.5 10*3/uL — ABNORMAL HIGH (ref 4.0–10.5)
nRBC: 0.3 % — ABNORMAL HIGH (ref 0.0–0.2)

## 2021-05-09 LAB — MAGNESIUM: Magnesium: 2.6 mg/dL — ABNORMAL HIGH (ref 1.7–2.4)

## 2021-05-09 LAB — ANA W/REFLEX IF POSITIVE: Anti Nuclear Antibody (ANA): NEGATIVE

## 2021-05-09 MED ORDER — DEXTROSE 5 % IV SOLN
2.0000 ug/min | INTRAVENOUS | Status: DC
  Administered 2021-05-09: 2 ug/min via INTRAVENOUS
  Administered 2021-05-10: 1 ug/min via INTRAVENOUS
  Administered 2021-05-10: 2 ug/min via INTRAVENOUS
  Administered 2021-05-10: 5 ug/min via INTRAVENOUS
  Filled 2021-05-09 (×4): qty 5

## 2021-05-09 MED ORDER — METHYLNALTREXONE BROMIDE 12 MG/0.6ML ~~LOC~~ SOLN
12.0000 mg | Freq: Once | SUBCUTANEOUS | Status: AC
  Administered 2021-05-09: 12 mg via SUBCUTANEOUS
  Filled 2021-05-09: qty 0.6

## 2021-05-09 MED ORDER — PRISMASOL BGK 0/2.5 32-2.5 MEQ/L EC SOLN
Status: DC
  Filled 2021-05-09 (×3): qty 5000

## 2021-05-09 MED ORDER — HEPARIN SODIUM (PORCINE) 5000 UNIT/ML IJ SOLN
5000.0000 [IU] | Freq: Three times a day (TID) | INTRAMUSCULAR | Status: DC
  Administered 2021-05-09 – 2021-05-16 (×22): 5000 [IU] via SUBCUTANEOUS
  Filled 2021-05-09 (×22): qty 1

## 2021-05-09 MED ORDER — SODIUM CHLORIDE 0.9 % IV SOLN
500.0000 [IU]/h | INTRAVENOUS | Status: DC
  Administered 2021-05-09 – 2021-05-10 (×2): 500 [IU]/h via INTRAVENOUS_CENTRAL
  Administered 2021-05-11 (×2): 750 [IU]/h via INTRAVENOUS_CENTRAL
  Administered 2021-05-12 – 2021-05-16 (×11): 1000 [IU]/h via INTRAVENOUS_CENTRAL
  Filled 2021-05-09 (×2): qty 2
  Filled 2021-05-09 (×2): qty 10000
  Filled 2021-05-09 (×3): qty 2
  Filled 2021-05-09: qty 10000
  Filled 2021-05-09: qty 2
  Filled 2021-05-09: qty 10000
  Filled 2021-05-09 (×3): qty 2
  Filled 2021-05-09: qty 10000
  Filled 2021-05-09: qty 2
  Filled 2021-05-09: qty 10000

## 2021-05-09 MED ORDER — ALTEPLASE 2 MG IJ SOLR
2.0000 mg | Freq: Once | INTRAMUSCULAR | Status: AC
  Administered 2021-05-09: 2 mg
  Filled 2021-05-09: qty 2

## 2021-05-09 MED ORDER — HEPARIN SODIUM (PORCINE) 1000 UNIT/ML IJ SOLN
1000.0000 [IU] | INTRAMUSCULAR | Status: DC | PRN
  Administered 2021-05-09: 1400 [IU] via INTRAVENOUS
  Filled 2021-05-09 (×2): qty 6

## 2021-05-09 NOTE — Progress Notes (Signed)
[facility] Progress Note   Subjective:   CT yest with extensive consolidations, pneumothorax noted req CT insertion.  Required prone positioning overnight.  Bradycardia and hypotension have limited UF with CRRT - Net I/os yesterday 0.75+ and 10L for the admission.   Objective Vitals:   05/09/21 1015 05/09/21 1030 05/09/21 1045 05/09/21 1100  BP: (!) 93/47 (!) 117/52 (!) 113/52 (!) 114/52  Pulse: 62 60 60 60  Resp: _0 Temp: (!) 96.4 F (35.8 C) (!) 96.6 F (35.9 C) (!) 97 F (36.1 C) (!) 97.3 F (36.3 C)  TempSrc:      SpO2: 95% 95% 94% 93%  Weight:      Height:       Physical Exam General: intubated, sedated, proned Heart: sinus brady on monitor Lungs: coarse, FiO2 100%, L chest tube noted Extremities: 1+ diffuse edema Dialysis Access:  RIJ temp HD cath  Additional Objective Labs: Basic Metabolic Panel: Recent Labs  Lab 05/08/21 0508 05/08/21 0926 05/08/21 1544 05/08/21 1758 05/09/21 0005 05/09/21 0250 05/09/21 0500 05/09/21 0823  NA 139  --  138  --  138  --  136  --   K 5.9*   < > 5.6*   < > 5.0 4.8 4.7 4.7  CL 99  --  100  --   --   --  99  --   CO2 29  --  25  --   --   --  27  --   GLUCOSE 107*  --  134*  --   --   --  145*  --   BUN 78*  --  68*  --   --   --  57*  --   CREATININE 2.85*  --  2.48*  --   --   --  2.17*  --   CALCIUM 7.3*  --  7.3*  --   --   --  7.4*  --   PHOS 3.7  --  4.1  --   --   --  4.3  --    < > = values in this interval not displayed.    Liver Function Tests: Recent Labs  Lab 04/26/2021 1847 05/22/2021 0419 05/19/2021 1600 05/08/21 0508 05/08/21 1544 05/09/21 0500  AST 20 26  --   --   --   --   ALT 19 22  --   --   --   --   ALKPHOS 105 95  --   --   --   --   BILITOT 1.3* 0.7  --   --   --   --   PROT 6.8 5.0*  --   --   --   --   ALBUMIN 3.3* 2.1*   < > 2.1* 2.2* 2.2*   < > = values in this interval not displayed.    No results for input(s): LIPASE, AMYLASE in the last 168  hours. CBC: Recent Labs  Lab 04/30/2021 1508 04/18/2021 1847 05/03/21 0749 05/05/21 0330 05/06/21 0435 05/23/2021 0419 05/08/21 0508 05/09/21 0005 05/09/21 0500  WBC 9.4 9.4   < > 15.0* 18.8* 17.2* 18.8*  --  18.5*  NEUTROABS 7.5 8.3*  --  14.1*  --   --   --   --   --   HGB 11.9* 12.0   < > 11.4* 11.2* 11.0* 10.2* 10.9* 10.4*  HCT 38.9 42.7   < > 38.8 38.1 37.5 35.1* 32.0* 35.5*  MCV 96.1 105.7*   < > 99.7 100.5* 100.3* 100.3*  --  100.9*  PLT 392.0 365   < > 256 213 190 204  --  163   < > = values in this interval not displayed.    Blood Culture    Component Value Date/Time   SDES BRONCHIAL ALVEOLAR LAVAGE 05/24/2021 1721   SPECREQUEST NONE 05/28/2021 1721   CULT  05/24/2021 1721    Normal respiratory flora-no Staph aureus or Pseudomonas seen Performed at [facility], [place] [address] [place], [place] [address]    REPTSTATUS 05/09/2021 FINAL 05/18/2021 1721    Cardiac Enzymes: No results for input(s): CKTOTAL, CKMB, CKMBINDEX, TROPONINI in the last 168 hours. CBG: Recent Labs  Lab 05/08/21 1940 05/08/21 2347 05/09/21 0335 05/09/21 0739 05/09/21 1114  GLUCAP 164* 137* 117* 159* 199*    Iron Studies: No results for input(s): IRON, TIBC, TRANSFERRIN, FERRITIN in the last 72 hours. _0 @ Studies/Results: DG Chest 1 View  Result Date: 05/08/2021 CLINICAL DATA:  Check chest tube placement EXAM: PORTABLE CHEST 1 VIEW COMPARISON:  Film from earlier in the same day. FINDINGS: Cardiac shadow is stable. Endotracheal tube, gastric catheter and bilateral jugular central lines are again seen and stable. New pigtail catheter is noted on the left with reduction in left-sided pleural effusion. Persistent central vascular congestion is noted as well as patchy airspace disease predominantly in the right lung. Small right effusion is noted as well. No new focal abnormality is seen. No pneumothorax is noted. IMPRESSION: Interval placement of left-sided chest tube with  decrease in left pleural effusion and improved aeration. Remainder of the exam is stable.  No pneumothorax is noted. Electronically Signed   By: Inez Catalina M.D.   On: 05/08/2021 19:02   DG Chest 1 View  Result Date: 05/06/2021 CLINICAL DATA:  Endotracheal tube placement EXAM: CHEST  1 VIEW COMPARISON:  05/10/2021 at 12:46 p.m. FINDINGS: Leftward rotation and reverse lordotic projection complicates the assessment. The endotracheal tube tip appears to be about 2.8 cm above the carina, satisfactorily position. A nasogastric tube enters the stomach. Left central line tip projects over the lower SVC. Right internal jugular central line tip also projects over the lower SVC. Continued indistinct airspace opacities at the lung bases with interstitial accentuation and ill definition of the pulmonary vasculature. Atherosclerotic calcification of the aortic arch. Possible mild cardiomegaly. Substantial thoracic spondylosis. IMPRESSION: 1. The endotracheal tube appears satisfactorily positioned, tip 2.8 cm above the carina. The other tubes and lines likewise appear satisfactory where visualized. 2. Continued basilar airspace opacities and interstitial accentuation bilaterally. 3. Possible mild cardiomegaly. 4.  Aortic Atherosclerosis (ICD10-I70.0). 5. Thoracic spondylosis. Electronically Signed   By: Van Clines M.D.   On: 06/03/2021 18:38   CT CHEST WO CONTRAST  Result Date: 05/08/2021 CLINICAL DATA:  Hemoptysis. EXAM: CT CHEST WITHOUT CONTRAST TECHNIQUE: Multidetector CT imaging of the chest was performed following the standard protocol without IV contrast. COMPARISON:  None. FINDINGS: Cardiovascular: Right-sided venous catheters are in place. There is moderate severity calcification of the aortic arch. Moderate to marked severity cardiomegaly is seen. A large, predominantly posterior pericardial effusion is seen (approximately 1.9 cm in thickness). Mediastinum/Nodes: There is mild AP window lymphadenopathy.  Thyroid gland, trachea, and esophagus demonstrate no significant findings. Lungs/Pleura: Endotracheal and nasogastric tubes are in place. Marked severity infiltrates are seen involving the bilateral upper lobes and right middle lobe. Marked severity consolidation is seen throughout the bilateral lower lobes. There is a small left  pleural effusion with suspected loculated components. A moderate to large right pleural effusion is also seen. A moderate sized pneumothorax is seen along the anteromedial aspect of the mid to upper left lung. This measures approximately 3.5 cm in AP measurement and extends from the region near the apex to the mid left lung. An additional small pneumothorax is seen along the anteromedial aspect of the left apex. This measures 6 mm in thickness. Upper Abdomen: An 8 mm cystic appearing area is noted within the liver dome. A layer of gallstones and/or sludge is seen within the gallbladder lumen. Musculoskeletal: Multilevel degenerative changes seen throughout the thoracic spine. IMPRESSION: 1. Small to moderate sized anterior left-sided pneumothorax. 2. Marked severity bilateral upper lobe and right middle lobe infiltrates with marked severity bilateral lower lobe consolidation. 3. Bilateral pleural effusions, right larger than left, with suspected loculated components on the left. 4. Large, predominantly posterior pericardial effusion. 5. Cholelithiasis and/or sludge within the gallbladder lumen. 6. Aortic atherosclerosis. Aortic Atherosclerosis (ICD10-I70.0). Electronically Signed   By: Virgina Norfolk M.D.   On: 05/08/2021 19:48   US RENAL  Result Date: 05/28/2021 CLINICAL DATA:  AKI EXAM: RENAL / URINARY TRACT ULTRASOUND COMPLETE COMPARISON:  01/12/2014 ultrasound of the abdomen. FINDINGS: Right Kidney: Renal measurements: 13.6 x 6.2 x 7.0 cm = volume: 334 mL. Echogenicity within normal limits. Nonobstructing nephrolithiasis in the right kidney, which measures up to 10 mm. No mass or  hydronephrosis visualized. Left Kidney: Evaluation is somewhat limited by body habitus and overlying bowel gas. Renal measurements: 11.2 x 7.0 x 4.8 cm = volume: 195 mL. Echogenicity within normal limits. Nonobstructing nephrolithiasis in the left kidney, which measures up to 14 mm no mass or hydronephrosis visualized. Bladder: Appears normal for degree of bladder distention. Other: None. IMPRESSION: 1. Nonobstructing nephrolithiasis bilaterally. 2. Otherwise normal bilateral kidneys. Electronically Signed   By: Merilyn Baba M.D.   On: 05/29/2021 14:57   DG Chest Port 1 View  Result Date: 05/08/2021 CLINICAL DATA:  Endotracheal tube. EXAM: PORTABLE CHEST 1 VIEW COMPARISON:  Chest x-ray 05/08/2021. FINDINGS: Examination is significantly limited secondary to patient positioning and overlying artifact. Endotracheal tube tip is likely at the level of the carina. Bilateral central venous catheter tips project over the presumed SVC, unchanged. Enteric tube extends below the diaphragm. Cardiomediastinal silhouette is grossly unchanged. There are increasing left lung infiltrates diffusely. Right lung is better aerated. Small pleural effusions persist. No definite pneumothorax or acute fracture. IMPRESSION: 1. Significantly limited examination. Endotracheal tube tip is likely at the level of the carina. 2. Other lines and tubes are grossly unchanged. 3. Increasing left lung airspace disease and decreasing right lung airspace disease may represent shifting edema. 4. Stable small pleural effusions. Electronically Signed   By: Ronney Asters M.D.   On: 05/08/2021 22:56   DG Chest Port 1 View  Result Date: 05/08/2021 CLINICAL DATA:  Check endotracheal tube placement EXAM: PORTABLE CHEST 1 VIEW COMPARISON:  05/06/2021 FINDINGS: Cardiac shadow is stable. Endotracheal tube, gastric catheter and left jugular central line are noted in satisfactory position stable from the prior exam. Right temporary dialysis catheter is noted  in the distal superior vena cava as well. Increased vascular congestion is noted when compare with the prior exam. Airspace opacities are noted likely related to edema. IMPRESSION: Increasing CHF. Tubes and lines as described in satisfactory position. Electronically Signed   By: Inez Catalina M.D.   On: 05/08/2021 03:44   DG CHEST PORT 1 VIEW  Result Date: 05/08/2021  CLINICAL DATA:  Central line placement. EXAM: PORTABLE CHEST 1 VIEW COMPARISON:  Earlier same day chest radiograph at 5:25 a.m. FINDINGS: Interval placement of right IJ central venous catheter with the tip projecting at the level of the superior cavoatrial junction. Stable position of the left IJ central venous catheter, terminating at the superior cavoatrial junction. Endotracheal tube tip projects 3.9 cm above the carina. Enteric tube tip is below the diaphragm but not included on the image. Unchanged left lower lobe and diffuse right airspace opacities compared to earlier same day radiograph. No pneumothorax. Heart size is difficult to assess due to adjacent consolidations but appears enlarged, stable. IMPRESSION: 1. Interval placement of right IJ central venous catheter; appropriate positioning. 2. Other support lines and tubes are stable. 3. Unchanged bilateral airspace opacities. Electronically Signed   By: Ileana Roup M.D.   On: 06/01/2021 13:18   ECHOCARDIOGRAM LIMITED  Result Date: 05/08/2021    ECHOCARDIOGRAM LIMITED REPORT   Patient Name:   ASHTEN SARNOWSKI Date of Exam: 05/08/2021 Medical Rec #:  [id number]    Height:       60.0 in Accession #:    [id number]   Weight:       242.9 lb Date of Birth:  1946/07/08   BSA:          2.027 m Patient Age:    10 years     BP:           113/100 mmHg Patient Gender: F            HR:           123 bpm. Exam Location:  Inpatient Procedure: Limited Echo Indications:    Pericardial effusion  History:        Patient has prior history of Echocardiogram examinations, most                 recent 05/14/2021.  Arrythmias:Atrial Fibrillation and                 Tachycardia; Risk Factors:Hypertension, Dyslipidemia and                 Obesity. Pneumothorax. Respiratory failure.  Sonographer:    Merrie Roof RDCS Referring Phys: [id number] [place]  1. Left ventricular ejection fraction, by estimation, is 60 to 65%. The left ventricle has normal function. There is mild left ventricular hypertrophy.  2. Right ventricular systolic function is normal. The right ventricular size is normal.  3. Left atrial size was mildly dilated.  4. Right atrial size was mildly dilated.  5. Large circumferential effusion with dilated IVC on vent Diastolic RA collapse but no RV collpase and no AV valve respiratory variation. Overall appearance of effusion is similar to TTE done 05/12/2021 Largest in posterior dimension . Large pericardial effusion.  6. Patient on positive pressure ventilation . The inferior vena cava is dilated in size with <50% respiratory variability, suggesting right atrial pressure of 15 mmHg. FINDINGS  Left Ventricle: Left ventricular ejection fraction, by estimation, is 60 to 65%. The left ventricle has normal function. The left ventricular internal cavity size was normal in size. There is mild left ventricular hypertrophy. Right Ventricle: The right ventricular size is normal. No increase in right ventricular wall thickness. Right ventricular systolic function is normal. Left Atrium: Left atrial size was mildly dilated. Right Atrium: Right atrial size was mildly dilated. Pericardium: Large circumferential effusion with dilated IVC on vent Diastolic RA collapse but no RV collpase and  no AV valve respiratory variation. Overall appearance of effusion is similar to TTE done 05/08/2021 Largest in posterior dimension. A large pericardial effusion is present. Venous: Patient on positive pressure ventilation. The inferior vena cava is dilated in size with less than 50% respiratory variability, suggesting right atrial  pressure of 15 mmHg. IVC IVC diam: 3.00 cm Jenkins Rouge MD Electronically signed by Jenkins Rouge MD Signature Date/Time: 05/08/2021/5:21:29 PM    Final    Medications:   prismasol BGK 4/2.5 300 mL/hr at 05/08/21 1523   sodium chloride 10 mL/hr at 05/09/21 1100   ceFEPime (MAXIPIME) IV Stopped (05/09/21 8527)   epinephrine 6 mcg/min (05/09/21 1100)   feeding supplement (VITAL 1.5 CAL) 1,000 mL (05/08/21 1213)   fentaNYL infusion INTRAVENOUS 225 mcg/hr (05/09/21 1100)   midazolam 4 mg/hr (05/09/21 1100)   norepinephrine (LEVOPHED) Adult infusion Stopped (05/08/21 1151)   prismasol BGK 2/2.5 dialysis solution 1,500 mL/hr at 05/09/21 0901   prismasol BGK 2/2.5 replacement solution 200 mL/hr at 05/09/21 0534   propofol (DIPRIVAN) infusion Stopped (06/02/2021 1203)    arformoterol  15 mcg Nebulization BID   artificial tears  1 application Both Eyes P8E   B-complex with vitamin C  1 tablet Per Tube Daily   budesonide (PULMICORT) nebulizer solution  0.5 mg Nebulization BID   chlorhexidine gluconate (MEDLINE KIT)  15 mL Mouth Rinse BID   Chlorhexidine Gluconate Cloth  6 each Topical Daily   clonazePAM  1 mg Per Tube QHS   feeding supplement (PROSource TF)  45 mL Per Tube BID   gabapentin  100 mg Per Tube Q8H   heparin injection (subcutaneous)  5,000 Units Subcutaneous Q8H   insulin aspart  0-20 Units Subcutaneous Q4H   levETIRAcetam  500 mg Per Tube BID   mouth rinse  15 mL Mouth Rinse 10 times per day   montelukast  10 mg Per Tube QPM   pantoprazole sodium  40 mg Per Tube Daily   polyethylene glycol  17 g Per Tube Daily   pravastatin  40 mg Per Tube Daily   predniSONE  40 mg Per Tube Q breakfast   revefenacin  175 mcg Nebulization Daily   senna-docusate  2 tablet Per Tube BID   sodium chloride flush  10-40 mL Intracatheter Q12H   sodium chloride flush  3 mL Intravenous Q12H   tranexamic acid  500 mg Nebulization Q8H    Assessment/Plan **AHRF, multifocal PNA: on significant vent support  per primary currently. Steroids, empiric abx. Volume management with CRRT -  hypotension has been limiting UF, will attempt to use vasopressor support to facilitate UF.  Eval for DAH/alveolitis per primary - serologies pending.    **AKI, severe, oliguric:  Multifactorial with contributions of contrast, hypotension > ATN.  Developed hyperkalemia, concern for AMS from uremia and was started on CRRT 10/3.   Renal US 10/3 with BL nonobstructing stones.  UA with hematuria - foley sample.  With DAH noted c/f vasculitis but her presenting creatinine was normal and her course of AKI is more consistent with hemodynamic insults and contrast nephropathy than a GN; I do not think a kidney biopsy is indicated nor is she stable enough for such.  --Cont CRRT today --Use vasopressor support to help facilitate UF today - would like to start making some progress on UF  --Avoid nephrotoxins and hypotension as able   **Hyperkalemia:  improved with CRRT   **A fib: on amiodarone; in SB currently.  Cardiology following.    **Pericardial effusion:  no tamponade and smaller on exam 10/3.  The effusion preceeded AKI/significant BUN elevation so not consistent with uremic etiology.  Thought to be COVID vaccine related.  Jannifer Hick MD 05/09/2021, 11:23 AM  [facility] Pager: [phone]

## 2021-05-09 NOTE — Progress Notes (Addendum)
NAME:  Lisa Hamilton, MRN:  [id number], DOB:  10/30/1945, LOS: 6 ADMISSION DATE:  04/30/2021, CONSULTATION DATE:  9/29 REFERRING MD:  Dr. Lorin Mercy, CHIEF COMPLAINT:  Cardiac arrest   History of Present Illness:  75 yr old female followed by Dr. Keturah Barre for COPD from asthma and chronic respiratory failure on 2 to 3 liters oxygen developed progressive cough, dyspnea, wheezing, and weakness.  Her husband reports she received COVID vaccine booster 2 days prior to developing symptoms.  She presented to ER on 9/28 and started on Bipap for acute on chronic hypoxic/hypercapnic respiratory failure.  She was started on steroids and antibiotics.  Had initial improvement and transitioned off Bipap per patient request.  Several hours later she then developed bradycardia leading to asystolic cardiac arrest.  Had ROSC after about 15 minutes.  Intubated and transferred to ICU.  Overnight patient has CT scan showing dependent effusions multifocal consolidations and small pneumothorax.  Chest tube was placed.  Patient remains intermittently bradycardic despite epinephrine use.  Continued CRRT with 1.5 L out in a net of +1.1 L in the last 24 hours.  Pertinent  Medical History  Anemia, Diverticulosis, DJD, GERD, Glaucoma, Hepatic cyst, Hiatal hernia, Colon polyps, HLD, HTN, Medullary sponge kidney, Goiter, Osteoporosis, SBO, Nephrolithiasis, RLS, Shingles, Vit D deficiency, prediabetes,  Significant Hospital Events: Including procedures, antibiotic start and stop dates in addition to other pertinent events   9/28 Admit 9/29 respiratory leading to cardiac arrest, intubated and transferred to ICU 9/30 tonic-clonic seizure and started on keppra, developed transient A fib and started on amiodarone, started on peripheral phenylephrine, Lt IJ CVL placed 10/1 back in sinus rhythm, off pressors, remains on increased PEEP/FiO2 10/3 - HD cath placed, started CRRT 10/04 - Chest tube placed for dependent consolidation.    Interim History / Subjective:  Patient in bed sedated and in prone position.   Objective   Blood pressure (!) 101/58, pulse 60, temperature (!) 96.6 F (35.9 C), resp. rate 20, height 5' (1.524 m), weight 110.2 kg, SpO2 95 %. CVP:  [11 mmHg-19 mmHg] 17 mmHg  Vent Mode: PRVC FiO2 (%):  [90 %-100 %] 100 % Set Rate:  [20 bmp] 20 bmp Vt Set:  [370 mL] 370 mL PEEP:  [10 cmH20] 10 cmH20 Plateau Pressure:  [16 cmH20-28 cmH20] 18 cmH20   Intake/Output Summary (Last 24 hours) at 05/09/2021 0700 Last data filed at 05/09/2021 0600 Gross per 24 hour  Intake 2767.29 ml  Output 2176 ml  Net 591.29 ml   Filed Weights   05/05/21 0500 05/06/21 0500 05/24/2021 0500  Weight: 105.8 kg 108.2 kg 110.2 kg    Examination: General -ill-appearing patient, sedated in prone position Eyes -defer once supine ENT - ETT in place Cardiac -deferred to supine Chest - scattered rhonchi and wheezing Abdomen -defer until supine Extremities - 1+ edema, warm Skin - no rashes Neuro - RASS -2 to -3   Patient Lines/Drains/Airways Status     Active Line/Drains/Airways     Name Placement date Placement time Site Days   Peripheral IV 05/03/21 20 G Right Antecubital 05/03/21  1710  Antecubital  3   Peripheral IV 05/04/21 20 G 2.5" Anterior;Left;Proximal;Upper Arm 05/04/21  0415  Arm  2   CVC Triple Lumen 05/04/21 Left Internal jugular 05/04/21  1800  -- 2   NG/OG Vented/Dual Lumen Oral 05/03/21  1900  Oral  3   Urethral Catheter  Temperature probe 05/04/21  0230  Temperature probe  2   Airway  7.5 mm 05/03/21  2310  -- 3   Incision (Closed) 10/15/17 Abdomen 10/15/17  1028  -- 1299   Incision (Closed) 12/04/17 Abdomen 12/04/17  1241  -- 1249   Incision - 5 Ports Abdomen 1: Umbilicus 2: Right 3: Left;Upper 4: Left;Mid 5: Left;Lower 12/04/17  1450  -- 1249   Wound / Incision (Open or Dehisced) 12/10/17 Incision - Open Abdomen Lower;Medial;Right JP drain site 12/10/17  2040  Abdomen  1243                   Resolved Hospital Problem list   Cardiogenic shock  Assessment & Plan:   Acute on chronic hypoxic/hypercapnic respiratory failure, Multifactoria Alveolar hemorrhage/Possible alveolitis: Respiratory Arrest: Patient with BAL showing hemorrhage concerning for alveolitis.  Cannot rule out infectious process.  CT scan yesterday showed dependent loculated effusion with multifocal infiltrates and lower lobe consolidations consolidation status post chest tube placement.  530 cc has been removed from chest tube since placement.  No obvious masses, but unable to rule out cancer. BAL shows negative growth reintubated and pending.  Autoimmune work-up pending although unlikely etiology.  Currently on steroids.  Continues to have increasing BAL concerning for DAH. She will need an autoimmune work up. Continue to have high O2 requirements.  - adjust PEEP/FiO2 to keep SpO2 > 92% - Finished 5/5 days of rocephin and zithromax -Continue cefepime will discontinue vancomycin MRSA PCR was negative. - Will continue systemic steroids.  -Autoimmune work-up pending - 2/2 days of TXA neps for hemoptysis  - Cont yupelri, brovana and continue pulmicort, singulair - prn albuterol  Pericardial Effusion 2/2 COVID vaccine rxn Patient continues to have a significant pericardial effusion from echo performed yesterday.  This is likely secondary to mRNA associated vaccine reaction.  No evidence of tamponade although patient remains hypotensive. -Appreciate cardiology's assistance -Continue CRRT per nephrology  Intermittent Afib>> sinus bradycardia: Patient had new onset atrial fibrillation secondary to respiratory arrest.  Was previously on amiodarone with development of bradycardia.  Amiodarone DC'd but remains bradycardic.  Propofol likely contributing to bradycardia and possibly ropinirole. Will restart heparin dvt ppx today, if she has another episode of Afib then will start full AC. -Continue to hold amiodarone and home  Cozaar -Continue epi to hopefully improve rate and hypotension  Oliguric AKI 2/2 ATN: Patient started CRRT with with roughly 1.5 L removed yesterday.  She remains intermittently hypotensive and bradycardic but otherwise relatively tolerating CRRT well.  Will likely need to pull more fluid. - Appreciate nephrologies assistance.  - Monitor urine output and kidney function daily - Will aim for net negative fluid removal.   Acute metabolic encephalopathy from hypoxia, hypercapnia. Isolated seizure after cardiac arrest on 9/30. - RASS goal 0 to -1 - CT head normal - f/u EEG - continue keppra for now - continue klonopin, neurontin - consider holding requip due to bradycardia  Steroid induced hyperglycemia. - SSI  Hx of osteoporosis. - hold outpt fosamax  Best Practice (right click and "Reselect all SmartList Selections" daily)   Diet/type: tubefeeds DVT prophylaxis: prophylactic heparin  GI prophylaxis: PPI Lines: N/A 1.5 L removed yesterday.  She seems to be tolerating this fairly well.:  Yes, and it is still needed Code Status:  full code Family: Updated pt's husband at bedside on 05/04/21.  Labs    CMP Latest Ref Rng & Units 05/09/2021 05/09/2021 05/09/2021  Glucose 70 - 99 mg/dL 145(H) - -  BUN 8 - 23 mg/dL 57(H) - -  Creatinine 0.44 -  1.00 mg/dL 2.17(H) - -  Sodium 135 - 145 mmol/L 136 - 138  Potassium 3.5 - 5.1 mmol/L 4.7 4.8 5.0  Chloride 98 - 111 mmol/L 99 - -  CO2 22 - 32 mmol/L 27 - -  Calcium 8.9 - 10.3 mg/dL 7.4(L) - -  Total Protein 6.5 - 8.1 g/dL - - -  Total Bilirubin 0.3 - 1.2 mg/dL - - -  Alkaline Phos 38 - 126 U/L - - -  AST 15 - 41 U/L - - -  ALT 0 - 44 U/L - - -    CBC Latest Ref Rng & Units 05/09/2021 05/09/2021 05/08/2021  WBC 4.0 - 10.5 K/uL 18.5(H) - 18.8(H)  Hemoglobin 12.0 - 15.0 g/dL 10.4(L) 10.9(L) 10.2(L)  Hematocrit 36.0 - 46.0 % 35.5(L) 32.0(L) 35.1(L)  Platelets 150 - 400 K/uL 163 - 204    ABG    Component Value Date/Time   PHART 7.388  05/09/2021 0005   PCO2ART 45.0 05/09/2021 0005   PO2ART 87 05/09/2021 0005   HCO3 27.6 05/09/2021 0005   TCO2 29 05/09/2021 0005   O2SAT 97.0 05/09/2021 0005    CBG (last 3)  Recent Labs    05/08/21 1940 05/08/21 2347 05/09/21 0335  GLUCAP 164* 137* 117*     Critical care time: [place], D.O.  Internal Medicine Resident, PGY-3 [facility]  Pager: [phone] 7:00 AM, 05/09/2021

## 2021-05-09 NOTE — Progress Notes (Signed)
Brief Nutrition Note  RD received consult for enteral nutrition initiation and management as part of ARDS protocol. Pt currently proned and receiving Vital 1.5 @ 40 ml/hr via OGT. RN titrating tube feeds to goal rate of 50 ml/hr.  Discussed pt with MD. Order placed for Cortrak today once pt is supinated around 3:00 pm this afternoon. Cortrak team is aware. RD will continue to follow pt during admission.   [name], MS, RD, LDN Inpatient Clinical Dietitian Please see [facility] for contact information.

## 2021-05-09 NOTE — Progress Notes (Signed)
Progress Note  Patient Name: Lisa Hamilton Date of Encounter: 05/09/2021  [facility] Cardiologist: None   Subjective   Intubated and sedated.  Unable to assess.   Inpatient Medications    Scheduled Meds:  alteplase  2 mg Intracatheter Once   arformoterol  15 mcg Nebulization BID   artificial tears  1 application Both Eyes H4T   B-complex with vitamin C  1 tablet Per Tube Daily   budesonide (PULMICORT) nebulizer solution  0.5 mg Nebulization BID   chlorhexidine gluconate (MEDLINE KIT)  15 mL Mouth Rinse BID   Chlorhexidine Gluconate Cloth  6 each Topical Daily   clonazePAM  1 mg Per Tube QHS   feeding supplement (PROSource TF)  45 mL Per Tube BID   gabapentin  100 mg Per Tube Q8H   heparin injection (subcutaneous)  5,000 Units Subcutaneous Q8H   insulin aspart  0-20 Units Subcutaneous Q4H   levETIRAcetam  500 mg Per Tube BID   mouth rinse  15 mL Mouth Rinse 10 times per day   methylnaltrexone  12 mg Subcutaneous Once   montelukast  10 mg Per Tube QPM   pantoprazole sodium  40 mg Per Tube Daily   polyethylene glycol  17 g Per Tube Daily   pravastatin  40 mg Per Tube Daily   predniSONE  40 mg Per Tube Q breakfast   revefenacin  175 mcg Nebulization Daily   senna-docusate  2 tablet Per Tube BID   sodium chloride flush  10-40 mL Intracatheter Q12H   sodium chloride flush  3 mL Intravenous Q12H   tranexamic acid  500 mg Nebulization Q8H   Continuous Infusions:   prismasol BGK 4/2.5 300 mL/hr at 05/08/21 1523   sodium chloride 10 mL/hr at 05/09/21 0800   ceFEPime (MAXIPIME) IV Stopped (05/09/21 6546)   epinephrine 5 mcg/min (05/09/21 0947)   feeding supplement (VITAL 1.5 CAL) 1,000 mL (05/08/21 1213)   fentaNYL infusion INTRAVENOUS 225 mcg/hr (05/09/21 0946)   midazolam 4 mg/hr (05/09/21 0800)   norepinephrine (LEVOPHED) Adult infusion Stopped (05/08/21 1151)   prismasol BGK 2/2.5 dialysis solution 1,500 mL/hr at 05/09/21 0901   prismasol BGK 2/2.5 replacement  solution 200 mL/hr at 05/09/21 0534   propofol (DIPRIVAN) infusion Stopped (05/10/2021 1203)   PRN Meds: acetaminophen **OR** acetaminophen, albuterol, atropine, bisacodyl, fentaNYL (SUBLIMAZE) injection, fentaNYL (SUBLIMAZE) injection, heparin, heparin sodium (porcine), hydrALAZINE, midazolam, [DISCONTINUED] ondansetron **OR** ondansetron (ZOFRAN) IV, rocuronium bromide, sodium chloride, sodium chloride flush   Vital Signs    Vitals:   05/09/21 0756 05/09/21 0800 05/09/21 0815 05/09/21 0830  BP:  (!) 127/59 128/76 (!) 115/59  Pulse: (!) 58 63 84   Resp: $Remo'20 20 17 20  'uQBpQ$ Temp:  (!) 96.1 F (35.6 C) (!) 96.1 F (35.6 C) (!) 95.9 F (35.5 C)  TempSrc:  Bladder    SpO2: 94% 96% 95%   Weight:      Height:        Intake/Output Summary (Last 24 hours) at 05/09/2021 0947 Last data filed at 05/09/2021 0800 Gross per 24 hour  Intake 2529.56 ml  Output 2191 ml  Net 338.56 ml   Last 3 Weights 06/03/2021 05/06/2021 05/05/2021  Weight (lbs) 242 lb 15.2 oz 238 lb 8.6 oz 233 lb 4 oz  Weight (kg) 110.2 kg 108.2 kg 105.8 kg      Telemetry    Sinus bradycardia.  PACs.   - Personally Reviewed  ECG    Sinus bradycardia.  Rate 58 bpm.  Low voltage. - Personally Reviewed  Physical Exam   VS:  BP (!) 115/59   Pulse 84   Temp (!) 95.9 F (35.5 C)   Resp 20   Ht 5' (1.524 m)   Wt 110.2 kg   SpO2 95%   BMI 47.45 kg/m  , BMI Body mass index is 47.45 kg/m. GENERAL: Critically ill-appearing intubated and sedated on vent.  Prone positioning. HEENT: Pupils equal round and reactive, fundi not visualized, oral mucosa unremarkable NECK:  No jugular venous distention, waveform within normal limits, carotid upstroke brisk and symmetric, no bruits, no thyromegaly LUNGS: Vented breath sounds.   HEART:  RRR.  PMI not displaced or sustained,S1 and S2 within normal limits, no S3, no S4, no clicks, no rubs, no murmurs ABD:  Flat, positive bowel sounds normal in frequency in pitch, no bruits, no rebound, no  guarding, no midline pulsatile mass, no hepatomegaly, no splenomegaly EXT:  2 plus pulses throughout, bilateral UE edema, no cyanosis no clubbing SKIN:  No rashes no nodules NEURO: Opens eyes to voice.  Nods head to questions.  Moves all four extremities  PSYCH:  unable to assess  Labs    High Sensitivity Troponin:   Recent Labs  Lab 04/10/2021 1847  TROPONINIHS 10     Chemistry Recent Labs  Lab 04/16/2021 1847 05/03/21 0749 05/05/2021 0419 05/23/2021 0546 05/23/2021 2223 05/08/21 0508 05/08/21 0926 05/08/21 1544 05/08/21 1758 05/09/21 0005 05/09/21 0250 05/09/21 0500 05/09/21 0823  NA 141   < > 141   < > 139 139  --  138  --  138  --  136  --   K 4.0   < > 5.8*   < > 5.6* 5.9*   < > 5.6*   < > 5.0 4.8 4.7 4.7  CL 99   < > 101   < > 100 99  --  100  --   --   --  99  --   CO2 35*   < > 32   < > 29 29  --  25  --   --   --  27  --   GLUCOSE 145*   < > 151*   < > 131* 107*  --  134*  --   --   --  145*  --   BUN 24*   < > 90*   < > 78* 78*  --  68*  --   --   --  57*  --   CREATININE 0.56   < > 3.10*   < > 2.78* 2.85*  --  2.48*  --   --   --  2.17*  --   CALCIUM 9.4   < > 7.9*   < > 7.7* 7.3*  --  7.3*  --   --   --  7.4*  --   MG  --    < >  --   --  2.4 2.4  --   --   --   --   --  2.6*  --   PROT 6.8  --  5.0*  --   --   --   --   --   --   --   --   --   --   ALBUMIN 3.3*  --  2.1*   < >  --  2.1*  --  2.2*  --   --   --  2.2*  --  AST 20  --  26  --   --   --   --   --   --   --   --   --   --   ALT 19  --  22  --   --   --   --   --   --   --   --   --   --   ALKPHOS 105  --  95  --   --   --   --   --   --   --   --   --   --   BILITOT 1.3*  --  0.7  --   --   --   --   --   --   --   --   --   --   GFRNONAA >60   < > 15*   < > 17* 17*  --  20*  --   --   --  23*  --   ANIONGAP 7   < > 8   < > 10 11  --  13  --   --   --  10  --    < > = values in this interval not displayed.    Lipids No results for input(s): CHOL, TRIG, HDL, LABVLDL, LDLCALC, CHOLHDL in the last 168  hours.  Hematology Recent Labs  Lab 05/26/2021 0419 05/08/21 0508 05/09/21 0005 05/09/21 0500  WBC 17.2* 18.8*  --  18.5*  RBC 3.74* 3.50*  --  3.52*  HGB 11.0* 10.2* 10.9* 10.4*  HCT 37.5 35.1* 32.0* 35.5*  MCV 100.3* 100.3*  --  100.9*  MCH 29.4 29.1  --  29.5  MCHC 29.3* 29.1*  --  29.3*  RDW 14.9 15.1  --  15.2  PLT 190 204  --  163   Thyroid No results for input(s): TSH, FREET4 in the last 168 hours.  BNP Recent Labs  Lab 04/17/2021 1508 04/16/2021 1847  BNP  --  132.3*  PROBNP 333*  --     DDimer  Recent Labs  Lab 04/10/2021 1515  DDIMER 3.04*     Radiology    DG Chest 1 View  Result Date: 05/08/2021 CLINICAL DATA:  Check chest tube placement EXAM: PORTABLE CHEST 1 VIEW COMPARISON:  Film from earlier in the same day. FINDINGS: Cardiac shadow is stable. Endotracheal tube, gastric catheter and bilateral jugular central lines are again seen and stable. New pigtail catheter is noted on the left with reduction in left-sided pleural effusion. Persistent central vascular congestion is noted as well as patchy airspace disease predominantly in the right lung. Small right effusion is noted as well. No new focal abnormality is seen. No pneumothorax is noted. IMPRESSION: Interval placement of left-sided chest tube with decrease in left pleural effusion and improved aeration. Remainder of the exam is stable.  No pneumothorax is noted. Electronically Signed   By: Inez Catalina M.D.   On: 05/08/2021 19:02   DG Chest 1 View  Result Date: 05/31/2021 CLINICAL DATA:  Endotracheal tube placement EXAM: CHEST  1 VIEW COMPARISON:  05/11/2021 at 12:46 p.m. FINDINGS: Leftward rotation and reverse lordotic projection complicates the assessment. The endotracheal tube tip appears to be about 2.8 cm above the carina, satisfactorily position. A nasogastric tube enters the stomach. Left central line tip projects over the lower SVC. Right internal jugular central line tip also projects over the lower SVC.  Continued indistinct airspace  opacities at the lung bases with interstitial accentuation and ill definition of the pulmonary vasculature. Atherosclerotic calcification of the aortic arch. Possible mild cardiomegaly. Substantial thoracic spondylosis. IMPRESSION: 1. The endotracheal tube appears satisfactorily positioned, tip 2.8 cm above the carina. The other tubes and lines likewise appear satisfactory where visualized. 2. Continued basilar airspace opacities and interstitial accentuation bilaterally. 3. Possible mild cardiomegaly. 4.  Aortic Atherosclerosis (ICD10-I70.0). 5. Thoracic spondylosis. Electronically Signed   By: Van Clines M.D.   On: 05/27/2021 18:38   CT CHEST WO CONTRAST  Result Date: 05/08/2021 CLINICAL DATA:  Hemoptysis. EXAM: CT CHEST WITHOUT CONTRAST TECHNIQUE: Multidetector CT imaging of the chest was performed following the standard protocol without IV contrast. COMPARISON:  None. FINDINGS: Cardiovascular: Right-sided venous catheters are in place. There is moderate severity calcification of the aortic arch. Moderate to marked severity cardiomegaly is seen. A large, predominantly posterior pericardial effusion is seen (approximately 1.9 cm in thickness). Mediastinum/Nodes: There is mild AP window lymphadenopathy. Thyroid gland, trachea, and esophagus demonstrate no significant findings. Lungs/Pleura: Endotracheal and nasogastric tubes are in place. Marked severity infiltrates are seen involving the bilateral upper lobes and right middle lobe. Marked severity consolidation is seen throughout the bilateral lower lobes. There is a small left pleural effusion with suspected loculated components. A moderate to large right pleural effusion is also seen. A moderate sized pneumothorax is seen along the anteromedial aspect of the mid to upper left lung. This measures approximately 3.5 cm in AP measurement and extends from the region near the apex to the mid left lung. An additional small  pneumothorax is seen along the anteromedial aspect of the left apex. This measures 6 mm in thickness. Upper Abdomen: An 8 mm cystic appearing area is noted within the liver dome. A layer of gallstones and/or sludge is seen within the gallbladder lumen. Musculoskeletal: Multilevel degenerative changes seen throughout the thoracic spine. IMPRESSION: 1. Small to moderate sized anterior left-sided pneumothorax. 2. Marked severity bilateral upper lobe and right middle lobe infiltrates with marked severity bilateral lower lobe consolidation. 3. Bilateral pleural effusions, right larger than left, with suspected loculated components on the left. 4. Large, predominantly posterior pericardial effusion. 5. Cholelithiasis and/or sludge within the gallbladder lumen. 6. Aortic atherosclerosis. Aortic Atherosclerosis (ICD10-I70.0). Electronically Signed   By: Virgina Norfolk M.D.   On: 05/08/2021 19:48   US RENAL  Result Date: 05/22/2021 CLINICAL DATA:  AKI EXAM: RENAL / URINARY TRACT ULTRASOUND COMPLETE COMPARISON:  01/12/2014 ultrasound of the abdomen. FINDINGS: Right Kidney: Renal measurements: 13.6 x 6.2 x 7.0 cm = volume: 334 mL. Echogenicity within normal limits. Nonobstructing nephrolithiasis in the right kidney, which measures up to 10 mm. No mass or hydronephrosis visualized. Left Kidney: Evaluation is somewhat limited by body habitus and overlying bowel gas. Renal measurements: 11.2 x 7.0 x 4.8 cm = volume: 195 mL. Echogenicity within normal limits. Nonobstructing nephrolithiasis in the left kidney, which measures up to 14 mm no mass or hydronephrosis visualized. Bladder: Appears normal for degree of bladder distention. Other: None. IMPRESSION: 1. Nonobstructing nephrolithiasis bilaterally. 2. Otherwise normal bilateral kidneys. Electronically Signed   By: Merilyn Baba M.D.   On: 05/16/2021 14:57   DG Chest Port 1 View  Result Date: 05/08/2021 CLINICAL DATA:  Endotracheal tube. EXAM: PORTABLE CHEST 1 VIEW  COMPARISON:  Chest x-ray 05/08/2021. FINDINGS: Examination is significantly limited secondary to patient positioning and overlying artifact. Endotracheal tube tip is likely at the level of the carina. Bilateral central venous catheter tips project over the  presumed SVC, unchanged. Enteric tube extends below the diaphragm. Cardiomediastinal silhouette is grossly unchanged. There are increasing left lung infiltrates diffusely. Right lung is better aerated. Small pleural effusions persist. No definite pneumothorax or acute fracture. IMPRESSION: 1. Significantly limited examination. Endotracheal tube tip is likely at the level of the carina. 2. Other lines and tubes are grossly unchanged. 3. Increasing left lung airspace disease and decreasing right lung airspace disease may represent shifting edema. 4. Stable small pleural effusions. Electronically Signed   By: Ronney Asters M.D.   On: 05/08/2021 22:56   DG Chest Port 1 View  Result Date: 05/08/2021 CLINICAL DATA:  Check endotracheal tube placement EXAM: PORTABLE CHEST 1 VIEW COMPARISON:  05/28/2021 FINDINGS: Cardiac shadow is stable. Endotracheal tube, gastric catheter and left jugular central line are noted in satisfactory position stable from the prior exam. Right temporary dialysis catheter is noted in the distal superior vena cava as well. Increased vascular congestion is noted when compare with the prior exam. Airspace opacities are noted likely related to edema. IMPRESSION: Increasing CHF. Tubes and lines as described in satisfactory position. Electronically Signed   By: Inez Catalina M.D.   On: 05/08/2021 03:44   DG CHEST PORT 1 VIEW  Result Date: 05/06/2021 CLINICAL DATA:  Central line placement. EXAM: PORTABLE CHEST 1 VIEW COMPARISON:  Earlier same day chest radiograph at 5:25 a.m. FINDINGS: Interval placement of right IJ central venous catheter with the tip projecting at the level of the superior cavoatrial junction. Stable position of the left IJ  central venous catheter, terminating at the superior cavoatrial junction. Endotracheal tube tip projects 3.9 cm above the carina. Enteric tube tip is below the diaphragm but not included on the image. Unchanged left lower lobe and diffuse right airspace opacities compared to earlier same day radiograph. No pneumothorax. Heart size is difficult to assess due to adjacent consolidations but appears enlarged, stable. IMPRESSION: 1. Interval placement of right IJ central venous catheter; appropriate positioning. 2. Other support lines and tubes are stable. 3. Unchanged bilateral airspace opacities. Electronically Signed   By: Ileana Roup M.D.   On: 05/21/2021 13:18   ECHOCARDIOGRAM LIMITED  Result Date: 05/08/2021    ECHOCARDIOGRAM LIMITED REPORT   Patient Name:   Lisa Hamilton Date of Exam: 05/08/2021 Medical Rec #:  [id number]    Height:       60.0 in Accession #:    [id number]   Weight:       242.9 lb Date of Birth:  1946/01/25   BSA:          2.027 m Patient Age:    75 years     BP:           113/100 mmHg Patient Gender: F            HR:           123 bpm. Exam Location:  Inpatient Procedure: Limited Echo Indications:    Pericardial effusion  History:        Patient has prior history of Echocardiogram examinations, most                 recent 05/31/2021. Arrythmias:Atrial Fibrillation and                 Tachycardia; Risk Factors:Hypertension, Dyslipidemia and                 Obesity. Pneumothorax. Respiratory failure.  Sonographer:    Merrie Roof RDCS Referring Phys: [id number] [place]  SMITH IMPRESSIONS  1. Left ventricular ejection fraction, by estimation, is 60 to 65%. The left ventricle has normal function. There is mild left ventricular hypertrophy.  2. Right ventricular systolic function is normal. The right ventricular size is normal.  3. Left atrial size was mildly dilated.  4. Right atrial size was mildly dilated.  5. Large circumferential effusion with dilated IVC on vent Diastolic RA collapse but no RV  collpase and no AV valve respiratory variation. Overall appearance of effusion is similar to TTE done 05/30/2021 Largest in posterior dimension . Large pericardial effusion.  6. Patient on positive pressure ventilation . The inferior vena cava is dilated in size with <50% respiratory variability, suggesting right atrial pressure of 15 mmHg. FINDINGS  Left Ventricle: Left ventricular ejection fraction, by estimation, is 60 to 65%. The left ventricle has normal function. The left ventricular internal cavity size was normal in size. There is mild left ventricular hypertrophy. Right Ventricle: The right ventricular size is normal. No increase in right ventricular wall thickness. Right ventricular systolic function is normal. Left Atrium: Left atrial size was mildly dilated. Right Atrium: Right atrial size was mildly dilated. Pericardium: Large circumferential effusion with dilated IVC on vent Diastolic RA collapse but no RV collpase and no AV valve respiratory variation. Overall appearance of effusion is similar to TTE done 05/19/2021 Largest in posterior dimension. A large pericardial effusion is present. Venous: Patient on positive pressure ventilation. The inferior vena cava is dilated in size with less than 50% respiratory variability, suggesting right atrial pressure of 15 mmHg. IVC IVC diam: 3.00 cm Jenkins Rouge MD Electronically signed by Jenkins Rouge MD Signature Date/Time: 05/08/2021/5:21:29 PM    Final     Cardiac Studies   Echo 05/05/21: IMPRESSIONS    1. Left ventricular ejection fraction, by estimation, is 60 to 65%. The  left ventricle has normal function. The left ventricle has no regional  wall motion abnormalities. Left ventricular diastolic parameters are  indeterminate.   2. Right ventricular systolic function is normal. The right ventricular  size is normal. Tricuspid regurgitation signal is inadequate for assessing  PA pressure.   3. The mitral valve is normal in structure. No evidence of  mitral valve  regurgitation. No evidence of mitral stenosis.   4. The aortic valve is tricuspid. Aortic valve regurgitation is not  visualized. Mild aortic valve sclerosis is present, with no evidence of  aortic valve stenosis.   5. The inferior vena cava is normal in size with greater than 50%  respiratory variability, suggesting right atrial pressure of 3 mmHg.   6. Large pericardial effusion. The pericardial effusion is  circumferential. The effusion measures 2.42cm at greatest diameter  posteriorly. There is RA inversion but no RV diastolic collapse and the  IVC is normal size and collapses > 50% with expiration.   There is normal MV inflow pattern with respirations. No findings of  impending tamponade but this is a clinical diagnosis made at the bedside.   Echo 05/16/2021: IMPRESSIONS     1. Large pericardial effusion (largest in posterior distribution); mild  RA collapse but no RV collapse; no respirtaory flow variation; IVC dilated  but pt on ventilator; no obvious tamponade physiology; findings similar to  05/05/21.   2. Left ventricular ejection fraction, by estimation, is 55 to 60%. The  left ventricle has normal function. The left ventricle has no regional  wall motion abnormalities.   3. Right ventricular systolic function is normal. The right ventricular  size is  normal.   4. Large pericardial effusion.   5. The mitral valve is normal in structure.   6. The aortic valve is tricuspid. Mild aortic valve sclerosis is present,  with no evidence of aortic valve stenosis.   Patient Profile     75 y.o. female with COPD, hypertension, hyperlipidemia, and GERD admitted with acute respiratory failure.  She subsequently developed bradycardia and asystolic arrest.  She was intubated and resuscitated for 15 minutes.  She had a seizure on 9/30 and transient atrial fibrillation.  Cardiology was consulted for pericardial effusion.  Assessment & Plan    # Pericardial effusion: Over the  weekend and plans were made for pericardiocentesis.  She has a large pericardial effusion that is mostly posterior.  No evidence of tamponade. Unchanged on repeated echo.  On review by the Cath Lab team it was not thought to be favorable for pericardiocentesis.  She remains hypotensive and requiring pressors.  She has been bradycardic which has limited volume removal with CVVHD.  This is not consistent with tamponade or hemodynamic stress from volume removal, as tachycardia would be expected.  Suspect that her bradycardia is related to her underlying critical illness and amiodarone rather than the effusion.  Effusion likely vaccine related.   #Paroxysmal atrial fibrillation: Noted transiently in the setting of hypoxic respiratory failure and cardiac arrest.  Now sinus bradycardia with PACs. She had another episode yesterday prior to chest tube placement.   #AKI: Now on CVVHD.  Unable to remove volume as above.    Total critical care time: 40 minutes. Critical care time was exclusive of separately billable procedures and treating other patients. Critical care was necessary to treat or prevent imminent or life-threatening deterioration. Critical care was time spent personally by me on the following activities: development of treatment plan with patient and/or surrogate as well as nursing, discussions with consultants, evaluation of patient's response to treatment, examination of patient, obtaining history from patient or surrogate, ordering and performing treatments and interventions, ordering and review of laboratory studies, ordering and review of radiographic studies, pulse oximetry and re-evaluation of patient's condition.  Family updated at bedside.  Prognosis is very poor with multiorgan failure and no signs of improvement.   For questions or updates, please contact [place] Please consult [email or web address] for contact info under        Signed, Skeet Latch, MD  05/09/2021, 9:47 AM

## 2021-05-09 NOTE — Procedures (Signed)
Cortrak  Person Inserting Tube:  Alroy Dust, Nai Borromeo L, RD Tube Type:  Cortrak - 43 inches Tube Size:  10 Tube Location:  Right nare Initial Placement:  Stomach Secured by: Bridle Technique Used to Measure Tube Placement:  Marking at nare/corner of mouth Cortrak Secured At:  82 cm  Cortrak Tube Team Note:  Consult received to place a Cortrak feeding tube.   X-ray is required, abdominal x-ray has been ordered by the Cortrak team. Please confirm tube placement before using the Cortrak tube.   If the tube becomes dislodged please keep the tube and contact the Cortrak team at [email or web address] ([email or web address]) for replacement.  If after hours and replacement cannot be delayed, place a NG tube and confirm placement with an abdominal x-ray.    Hermina Barters BS, PLDN Clinical Dietitian See [facility] for contact information.

## 2021-05-09 NOTE — Procedures (Signed)
Central Venous Catheter Insertion Procedure Note  Lisa Hamilton  [id number]  06-15-1946  Date:05/09/21  Time:5:04 PM   Provider Performing:Jeancarlos Marchena [place] Payment   Procedure: Insertion of Non-tunneled Central Venous Catheter(36556)with US guidance (84784)    Indication(s) Hemodialysis  Consent Risks of the procedure as well as the alternatives and risks of each were explained to the patient and/or caregiver.  Consent for the procedure was obtained and is signed in the bedside chart  Anesthesia Topical only with 1% lidocaine   Timeout Verified patient identification, verified procedure, site/side was marked, verified correct patient position, special equipment/implants available, medications/allergies/relevant history reviewed, required imaging and test results available.  Sterile Technique Maximal sterile technique including full sterile barrier drape, hand hygiene, sterile gown, sterile gloves, mask, hair covering, sterile ultrasound probe cover (if used).  Procedure Description Area of catheter insertion was cleaned with chlorhexidine and draped in sterile fashion.   With real-time ultrasound guidance a HD catheter was placed into the right internal jugular vein.  Nonpulsatile blood flow and easy flushing noted in all ports.  The catheter was sutured in place and sterile dressing applied.  Complications/Tolerance None; patient tolerated the procedure well. Chest X-ray is ordered to verify placement for internal jugular or subclavian cannulation.  Chest x-ray is not ordered for femoral cannulation.  EBL Minimal  Specimen(s) None   Lawerance Cruel, D.O.  Internal Medicine Resident, PGY-3 [facility]  Pager: [phone] 5:04 PM, 05/09/2021

## 2021-05-10 DIAGNOSIS — I469 Cardiac arrest, cause unspecified: Secondary | ICD-10-CM | POA: Diagnosis not present

## 2021-05-10 DIAGNOSIS — J9621 Acute and chronic respiratory failure with hypoxia: Secondary | ICD-10-CM | POA: Diagnosis not present

## 2021-05-10 DIAGNOSIS — E7800 Pure hypercholesterolemia, unspecified: Secondary | ICD-10-CM | POA: Diagnosis not present

## 2021-05-10 LAB — POCT I-STAT 7, (LYTES, BLD GAS, ICA,H+H)
Acid-Base Excess: 0 mmol/L (ref 0.0–2.0)
Bicarbonate: 27.6 mmol/L (ref 20.0–28.0)
Calcium, Ion: 1.15 mmol/L (ref 1.15–1.40)
HCT: 29 % — ABNORMAL LOW (ref 36.0–46.0)
Hemoglobin: 9.9 g/dL — ABNORMAL LOW (ref 12.0–15.0)
O2 Saturation: 95 %
Patient temperature: 36
Potassium: 4.4 mmol/L (ref 3.5–5.1)
Sodium: 136 mmol/L (ref 135–145)
TCO2: 29 mmol/L (ref 22–32)
pCO2 arterial: 57.7 mmHg — ABNORMAL HIGH (ref 32.0–48.0)
pH, Arterial: 7.283 — ABNORMAL LOW (ref 7.350–7.450)
pO2, Arterial: 83 mmHg (ref 83.0–108.0)

## 2021-05-10 LAB — GLUCOSE, CAPILLARY
Glucose-Capillary: 212 mg/dL — ABNORMAL HIGH (ref 70–99)
Glucose-Capillary: 185 mg/dL — ABNORMAL HIGH (ref 70–99)
Glucose-Capillary: 181 mg/dL — ABNORMAL HIGH (ref 70–99)
Glucose-Capillary: 217 mg/dL — ABNORMAL HIGH (ref 70–99)
Glucose-Capillary: 171 mg/dL — ABNORMAL HIGH (ref 70–99)
Glucose-Capillary: 159 mg/dL — ABNORMAL HIGH (ref 70–99)

## 2021-05-10 LAB — RENAL FUNCTION PANEL
Albumin: 1.8 g/dL — ABNORMAL LOW (ref 3.5–5.0)
Albumin: 2.1 g/dL — ABNORMAL LOW (ref 3.5–5.0)
Anion gap: 9 (ref 5–15)
Anion gap: 8 (ref 5–15)
BUN: 46 mg/dL — ABNORMAL HIGH (ref 8–23)
BUN: 40 mg/dL — ABNORMAL HIGH (ref 8–23)
CO2: 28 mmol/L (ref 22–32)
CO2: 25 mmol/L (ref 22–32)
Calcium: 7.5 mg/dL — ABNORMAL LOW (ref 8.9–10.3)
Calcium: 7.6 mg/dL — ABNORMAL LOW (ref 8.9–10.3)
Chloride: 100 mmol/L (ref 98–111)
Chloride: 100 mmol/L (ref 98–111)
Creatinine, Ser: 1.72 mg/dL — ABNORMAL HIGH (ref 0.44–1.00)
Creatinine, Ser: 1.37 mg/dL — ABNORMAL HIGH (ref 0.44–1.00)
GFR, Estimated: 31 mL/min — ABNORMAL LOW (ref >60)
GFR, Estimated: 41 mL/min — ABNORMAL LOW (ref >60)
Glucose, Bld: 195 mg/dL — ABNORMAL HIGH (ref 70–99)
Glucose, Bld: 203 mg/dL — ABNORMAL HIGH (ref 70–99)
Phosphorus: 3.8 mg/dL (ref 2.5–4.6)
Phosphorus: 3 mg/dL (ref 2.5–4.6)
Potassium: 4.4 mmol/L (ref 3.5–5.1)
Potassium: 4.7 mmol/L (ref 3.5–5.1)
Sodium: 137 mmol/L (ref 135–145)
Sodium: 133 mmol/L — ABNORMAL LOW (ref 135–145)

## 2021-05-10 LAB — APTT: aPTT: 38 seconds — ABNORMAL HIGH (ref 24–36)

## 2021-05-10 LAB — CBC
HCT: 29.3 % — ABNORMAL LOW (ref 36.0–46.0)
Hemoglobin: 8.7 g/dL — ABNORMAL LOW (ref 12.0–15.0)
MCH: 29.6 pg (ref 26.0–34.0)
MCHC: 29.7 g/dL — ABNORMAL LOW (ref 30.0–36.0)
MCV: 99.7 fL (ref 80.0–100.0)
Platelets: UNDETERMINED 10*3/uL (ref 150–400)
RBC: 2.94 MIL/uL — ABNORMAL LOW (ref 3.87–5.11)
RDW: 15 % (ref 11.5–15.5)
WBC: 12.2 10*3/uL — ABNORMAL HIGH (ref 4.0–10.5)
nRBC: 0.2 % (ref 0.0–0.2)

## 2021-05-10 LAB — RHEUMATOID FACTOR: Rheumatoid fact SerPl-aCnc: 13.2 IU/mL (ref <14.0)

## 2021-05-10 LAB — MAGNESIUM: Magnesium: 2.3 mg/dL (ref 1.7–2.4)

## 2021-05-10 LAB — CYCLIC CITRUL PEPTIDE ANTIBODY, IGG/IGA: CCP Antibodies IgG/IgA: 5 units (ref 0–19)

## 2021-05-10 LAB — POTASSIUM: Potassium: 4.4 mmol/L (ref 3.5–5.1)

## 2021-05-10 MED ORDER — BISACODYL 10 MG RE SUPP
10.0000 mg | Freq: Once | RECTAL | Status: AC
  Administered 2021-05-10: 10 mg via RECTAL
  Filled 2021-05-10: qty 1

## 2021-05-10 MED ORDER — POLYETHYLENE GLYCOL 3350 17 G PO PACK
17.0000 g | PACK | Freq: Two times a day (BID) | ORAL | Status: DC
  Administered 2021-05-10 – 2021-05-15 (×9): 17 g
  Filled 2021-05-10 (×10): qty 1

## 2021-05-10 MED ORDER — LACTULOSE 10 GM/15ML PO SOLN
20.0000 g | Freq: Three times a day (TID) | ORAL | Status: DC
  Administered 2021-05-10 (×2): 20 g via ORAL
  Filled 2021-05-10 (×3): qty 30

## 2021-05-10 MED ORDER — LACTULOSE 10 GM/15ML PO SOLN
20.0000 g | Freq: Three times a day (TID) | ORAL | Status: DC
  Administered 2021-05-11 – 2021-05-15 (×9): 20 g
  Filled 2021-05-10 (×11): qty 30

## 2021-05-10 NOTE — Progress Notes (Signed)
Nutrition Follow-up  DOCUMENTATION CODES:   Not applicable  INTERVENTION:   - Recommend adjusting bowel regimen as pt has not had a BM since admission  Continue tube feeding via Cortrak tube: - Vital 1.5 @ 50 ml/hr (1200 ml/day) - ProSource TF 45 ml BID   Tube feeding regimen provides 1880 kcal, 103 grams of protein, and 917 ml of H2O.    - B-complex with vitamin C to account for losses with CRRT  NUTRITION DIAGNOSIS:   Inadequate oral intake related to inability to eat as evidenced by NPO status.  Ongoing, being addressed via TF  GOAL:   Patient will meet greater than or equal to 90% of their needs  Met via TF  MONITOR:   Vent status, Labs, Weight trends, TF tolerance, I & O's  REASON FOR ASSESSMENT:   Ventilator, Consult Enteral/tube feeding initiation and management  ASSESSMENT:   75 year old female who presented to the ED on 9/28 with SOB. PMH of COPD, asthma, osteoporosis, HTN, HLD, GERD, anemia, diverticulosis, hiatal hernia, vitamin D deficiency. Pt admitted with acute on chronic respiratory failure in setting of asthma/COPD exacerbation.  09/29 - cardiac arrest, intubated 09/30 - seizure 10/03 - emergent bedside bronch, CRRT started 10/04 - pt proned, chest tube placed 10/05 - Cortrak placed (tip gastric)  Discussed pt with RN and during ICU rounds. Pt tolerating tube feeds via Cortrak tube. Pt still without a BM, bowel regimen is ordered. Pt remains on CRRT. Per CCM, holding on further proning due to issues with CRRT when pt is proned.  Admit weight: 101.8 kg Current weight: 110.5 kg  Weight up compared to admit weight. Pt with moderate pitting generalized edema and net positive 9.1 L since admit. Per RN, finally pulling fluid on CRRT.  Patient remains intubated on ventilator support MV: 9.8 L/min Temp (24hrs), Avg:96.9 F (36.1 C), Min:96.1 F (35.6 C), Max:98.8 F (37.1 C)  Drips: Fentanyl Epinephrine Heparin Isuprel  Medications  reviewed and include: B-complex with vitamin C, SSI q 4 hours, protonix, miralax, prednisone, senna, IV abx  Labs reviewed: BUN 46, creatinine 1.72, hemoglobin 9.9 CBG's: 181-277 x 24 hours  UOP: 90 ml x 24 hours CRRT UF: 2821 ml CT: 70 ml x 24 hours I/O's: +9.1 L since admit  Diet Order:   Diet Order             Diet NPO time specified  Diet effective now                   EDUCATION NEEDS:   No education needs have been identified at this time  Skin:  Skin Assessment: Reviewed RN Assessment (cellulitis to BLE)  Last BM:  no documented BM  Height:   Ht Readings from Last 1 Encounters:  05/03/21 5' (1.524 m)    Weight:   Wt Readings from Last 1 Encounters:  05/09/21 110.5 kg    Ideal Body Weight:  45.5 kg  BMI:  Body mass index is 47.58 kg/m.  Estimated Nutritional Needs:   Kcal:  1800-2000  Protein:  95-115 grams  Fluid:  1.7-1.9 L    [name], MS, RD, LDN Inpatient Clinical Dietitian Please see AMiON for contact information.

## 2021-05-10 NOTE — Progress Notes (Signed)
[facility] Progress Note   Subjective:   Catheter replaced and heparin added to CRRT yesterday - running well now.  Net even yesterday, now today net neg 50=100/hr.   Objective Vitals:   05/10/21 1015 05/10/21 1030 05/10/21 1045 05/10/21 1100  BP: (!) 146/52 (!) 134/56 (!) 160/55 (!) 148/49  Pulse: 71 66 70 69  Resp: (!) 25 (!) 25 (!) 25 (!) 25  Temp: (!) 96.6 F (35.9 C) (!) 96.4 F (35.8 C) (!) 96.4 F (35.8 C) (!) 96.4 F (35.8 C)  TempSrc:      SpO2: 95% 95% 94% 94%  Weight:      Height:       Physical Exam General: intubated, sedated Heart: NSR on monitor Lungs: coarse, FiO2 70%, L chest tube noted Extremities: 1+ diffuse edema Dialysis Access:  RIJ temp HD cath  Additional Objective Labs: Basic Metabolic Panel: Recent Labs  Lab 05/09/21 0500 05/09/21 0823 05/09/21 1629 05/09/21 2126 05/10/21 0136 05/10/21 0501 05/10/21 0603  NA 136  --  135  --   --  137 136  K 4.7   < > 5.7*  5.7*   < > 4.4 4.4 4.4  CL 99  --  100  --   --  100  --   CO2 27  --  25  --   --  28  --   GLUCOSE 145*  --  259*  --   --  195*  --   BUN 57*  --  63*  --   --  46*  --   CREATININE 2.17*  --  2.42*  --   --  1.72*  --   CALCIUM 7.4*  --  7.4*  --   --  7.5*  --   PHOS 4.3  --  5.4*  --   --  3.8  --    < > = values in this interval not displayed.    Liver Function Tests: Recent Labs  Lab 05/06/2021 0419 05/08/2021 1600 05/09/21 0500 05/09/21 1629 05/10/21 0501  AST 26  --   --   --   --   ALT 22  --   --   --   --   ALKPHOS 95  --   --   --   --   BILITOT 0.7  --   --   --   --   PROT 5.0*  --   --   --   --   ALBUMIN 2.1*   < > 2.2* 1.9* 1.8*   < > = values in this interval not displayed.    No results for input(s): LIPASE, AMYLASE in the last 168 hours. CBC: Recent Labs  Lab 05/05/21 0330 05/06/21 0435 05/10/2021 0419 05/08/21 0508 05/09/21 0005 05/09/21 0500 05/10/21 0501 05/10/21 0603  WBC 15.0* 18.8* 17.2* 18.8*  --  18.5* 12.2*  --    NEUTROABS 14.1*  --   --   --   --   --   --   --   HGB 11.4* 11.2* 11.0* 10.2*   < > 10.4* 8.7* 9.9*  HCT 38.8 38.1 37.5 35.1*   < > 35.5* 29.3* 29.0*  MCV 99.7 100.5* 100.3* 100.3*  --  100.9* 99.7  --   PLT 256 213 190 204  --  163 PLATELET CLUMPS NOTED ON SMEAR, UNABLE TO ESTIMATE  --    < > = values in this interval not displayed.    Blood  Culture    Component Value Date/Time   SDES BRONCHIAL ALVEOLAR LAVAGE 05/05/2021 1721   SPECREQUEST NONE 05/06/2021 1721   CULT  05/15/2021 1721    Normal respiratory flora-no Staph aureus or Pseudomonas seen Performed at [facility] [address], [place], [place] [address]    REPTSTATUS 05/09/2021 FINAL 05/11/2021 1721    Cardiac Enzymes: No results for input(s): CKTOTAL, CKMB, CKMBINDEX, TROPONINI in the last 168 hours. CBG: Recent Labs  Lab 05/09/21 1933 05/09/21 2323 05/10/21 0344 05/10/21 0742 05/10/21 1125  GLUCAP 277* 215* 212* 185* 181*    Iron Studies: No results for input(s): IRON, TIBC, TRANSFERRIN, FERRITIN in the last 72 hours. _0 @ Studies/Results: DG Chest 1 View  Result Date: 05/08/2021 CLINICAL DATA:  Check chest tube placement EXAM: PORTABLE CHEST 1 VIEW COMPARISON:  Film from earlier in the same day. FINDINGS: Cardiac shadow is stable. Endotracheal tube, gastric catheter and bilateral jugular central lines are again seen and stable. New pigtail catheter is noted on the left with reduction in left-sided pleural effusion. Persistent central vascular congestion is noted as well as patchy airspace disease predominantly in the right lung. Small right effusion is noted as well. No new focal abnormality is seen. No pneumothorax is noted. IMPRESSION: Interval placement of left-sided chest tube with decrease in left pleural effusion and improved aeration. Remainder of the exam is stable.  No pneumothorax is noted. Electronically Signed   By: Inez Catalina M.D.   On: 05/08/2021 19:02   CT CHEST WO  CONTRAST  Result Date: 05/08/2021 CLINICAL DATA:  Hemoptysis. EXAM: CT CHEST WITHOUT CONTRAST TECHNIQUE: Multidetector CT imaging of the chest was performed following the standard protocol without IV contrast. COMPARISON:  None. FINDINGS: Cardiovascular: Right-sided venous catheters are in place. There is moderate severity calcification of the aortic arch. Moderate to marked severity cardiomegaly is seen. A large, predominantly posterior pericardial effusion is seen (approximately 1.9 cm in thickness). Mediastinum/Nodes: There is mild AP window lymphadenopathy. Thyroid gland, trachea, and esophagus demonstrate no significant findings. Lungs/Pleura: Endotracheal and nasogastric tubes are in place. Marked severity infiltrates are seen involving the bilateral upper lobes and right middle lobe. Marked severity consolidation is seen throughout the bilateral lower lobes. There is a small left pleural effusion with suspected loculated components. A moderate to large right pleural effusion is also seen. A moderate sized pneumothorax is seen along the anteromedial aspect of the mid to upper left lung. This measures approximately 3.5 cm in AP measurement and extends from the region near the apex to the mid left lung. An additional small pneumothorax is seen along the anteromedial aspect of the left apex. This measures 6 mm in thickness. Upper Abdomen: An 8 mm cystic appearing area is noted within the liver dome. A layer of gallstones and/or sludge is seen within the gallbladder lumen. Musculoskeletal: Multilevel degenerative changes seen throughout the thoracic spine. IMPRESSION: 1. Small to moderate sized anterior left-sided pneumothorax. 2. Marked severity bilateral upper lobe and right middle lobe infiltrates with marked severity bilateral lower lobe consolidation. 3. Bilateral pleural effusions, right larger than left, with suspected loculated components on the left. 4. Large, predominantly posterior pericardial  effusion. 5. Cholelithiasis and/or sludge within the gallbladder lumen. 6. Aortic atherosclerosis. Aortic Atherosclerosis (ICD10-I70.0). Electronically Signed   By: Virgina Norfolk M.D.   On: 05/08/2021 19:48   DG CHEST PORT 1 VIEW  Result Date: 05/09/2021 CLINICAL DATA:  New dialysis catheter. EXAM: PORTABLE CHEST 1 VIEW COMPARISON:  May 08, 2021. FINDINGS: Stable  cardiomegaly. Endotracheal and feeding tubes are in good position. Interval placement of right internal jugular catheter with distal tip in expected position of cavoatrial junction. Left internal jugular catheter is noted as well with distal tip in expected position of cavoatrial junction. No pneumothorax is noted. Right lung opacity is noted concerning for worsening edema or pneumonia. Pleural effusion may be present. Left-sided chest tube is again noted without definite pneumothorax. IMPRESSION: Bilateral internal jugular catheters are noted with distal tips in expected position of cavoatrial junction. No pneumothorax is noted. Right lung opacity is noted concerning for worsening pneumonia or edema. Electronically Signed   By: Marijo Conception M.D.   On: 05/09/2021 17:32   DG Chest Port 1 View  Result Date: 05/08/2021 CLINICAL DATA:  Endotracheal tube. EXAM: PORTABLE CHEST 1 VIEW COMPARISON:  Chest x-ray 05/08/2021. FINDINGS: Examination is significantly limited secondary to patient positioning and overlying artifact. Endotracheal tube tip is likely at the level of the carina. Bilateral central venous catheter tips project over the presumed SVC, unchanged. Enteric tube extends below the diaphragm. Cardiomediastinal silhouette is grossly unchanged. There are increasing left lung infiltrates diffusely. Right lung is better aerated. Small pleural effusions persist. No definite pneumothorax or acute fracture. IMPRESSION: 1. Significantly limited examination. Endotracheal tube tip is likely at the level of the carina. 2. Other lines and tubes are  grossly unchanged. 3. Increasing left lung airspace disease and decreasing right lung airspace disease may represent shifting edema. 4. Stable small pleural effusions. Electronically Signed   By: Ronney Asters M.D.   On: 05/08/2021 22:56   DG Abd Portable 1V  Result Date: 05/09/2021 CLINICAL DATA:  NG tube placement EXAM: PORTABLE ABDOMEN - 1 VIEW COMPARISON:  None. FINDINGS: Feeding tube with the tip projecting over the antrum of the stomach. No bowel dilatation to suggest obstruction. No evidence of pneumoperitoneum, portal venous gas or pneumatosis. No pathologic calcifications along the expected course of the ureters. Bilateral small pleural effusions. Bilateral lower lung interstitial thickening and atelectasis. No acute osseous abnormality. IMPRESSION: Feeding tube with the tip projecting over the antrum of the stomach. Electronically Signed   By: Kathreen Devoid M.D.   On: 05/09/2021 15:14   ECHOCARDIOGRAM LIMITED  Result Date: 05/08/2021    ECHOCARDIOGRAM LIMITED REPORT   Patient Name:   KYNLEIGH ARTZ Date of Exam: 05/08/2021 Medical Rec #:  [id number]    Height:       60.0 in Accession #:    [id number]   Weight:       242.9 lb Date of Birth:  26-Aug-1945   BSA:          2.027 m Patient Age:    28 years     BP:           113/100 mmHg Patient Gender: F            HR:           123 bpm. Exam Location:  Inpatient Procedure: Limited Echo Indications:    Pericardial effusion  History:        Patient has prior history of Echocardiogram examinations, most                 recent 05/10/2021. Arrythmias:Atrial Fibrillation and                 Tachycardia; Risk Factors:Hypertension, Dyslipidemia and                 Obesity. Pneumothorax. Respiratory failure.  Sonographer:  [place] Referring Phys: [id number] [place]  1. Left ventricular ejection fraction, by estimation, is 60 to 65%. The left ventricle has normal function. There is mild left ventricular hypertrophy.  2. Right ventricular  systolic function is normal. The right ventricular size is normal.  3. Left atrial size was mildly dilated.  4. Right atrial size was mildly dilated.  5. Large circumferential effusion with dilated IVC on vent Diastolic RA collapse but no RV collpase and no AV valve respiratory variation. Overall appearance of effusion is similar to TTE done 05/24/2021 Largest in posterior dimension . Large pericardial effusion.  6. Patient on positive pressure ventilation . The inferior vena cava is dilated in size with <50% respiratory variability, suggesting right atrial pressure of 15 mmHg. FINDINGS  Left Ventricle: Left ventricular ejection fraction, by estimation, is 60 to 65%. The left ventricle has normal function. The left ventricular internal cavity size was normal in size. There is mild left ventricular hypertrophy. Right Ventricle: The right ventricular size is normal. No increase in right ventricular wall thickness. Right ventricular systolic function is normal. Left Atrium: Left atrial size was mildly dilated. Right Atrium: Right atrial size was mildly dilated. Pericardium: Large circumferential effusion with dilated IVC on vent Diastolic RA collapse but no RV collpase and no AV valve respiratory variation. Overall appearance of effusion is similar to TTE done 05/24/2021 Largest in posterior dimension. A large pericardial effusion is present. Venous: Patient on positive pressure ventilation. The inferior vena cava is dilated in size with less than 50% respiratory variability, suggesting right atrial pressure of 15 mmHg. IVC IVC diam: 3.00 cm Jenkins Rouge MD Electronically signed by Jenkins Rouge MD Signature Date/Time: 05/08/2021/5:21:29 PM    Final    Medications:  sodium chloride 10 mL/hr at 05/10/21 1100   ceFEPime (MAXIPIME) IV Stopped (05/10/21 4008)   epinephrine 3 mcg/min (05/10/21 1100)   feeding supplement (VITAL 1.5 CAL) 1,000 mL (05/08/21 1213)   fentaNYL infusion INTRAVENOUS 100 mcg/hr (05/10/21 1100)    heparin 10,000 units/ 20 mL infusion syringe 500 Units/hr (05/09/21 1735)   isoproterenol (ISUPREL) infusion 2 mcg/min (05/10/21 1100)   norepinephrine (LEVOPHED) Adult infusion Stopped (05/08/21 1151)   prismasol BGK 2/2.5 dialysis solution 2,000 mL/hr at 05/10/21 0909   prismasol BGK 2/2.5 replacement solution 200 mL/hr at 05/09/21 1704   prismasol BGK 2/2.5 replacement solution 300 mL/hr at 05/09/21 2016   propofol (DIPRIVAN) infusion Stopped (06/01/2021 1203)    arformoterol  15 mcg Nebulization BID   artificial tears  1 application Both Eyes Q7Y   B-complex with vitamin C  1 tablet Per Tube Daily   budesonide (PULMICORT) nebulizer solution  0.5 mg Nebulization BID   chlorhexidine gluconate (MEDLINE KIT)  15 mL Mouth Rinse BID   Chlorhexidine Gluconate Cloth  6 each Topical Daily   clonazePAM  1 mg Per Tube QHS   feeding supplement (PROSource TF)  45 mL Per Tube BID   gabapentin  100 mg Per Tube Q8H   heparin injection (subcutaneous)  5,000 Units Subcutaneous Q8H   insulin aspart  0-20 Units Subcutaneous Q4H   levETIRAcetam  500 mg Per Tube BID   mouth rinse  15 mL Mouth Rinse 10 times per day   montelukast  10 mg Per Tube QPM   pantoprazole sodium  40 mg Per Tube Daily   polyethylene glycol  17 g Per Tube BID   pravastatin  40 mg Per Tube Daily   predniSONE  40 mg Per  Tube Q breakfast   revefenacin  175 mcg Nebulization Daily   senna-docusate  2 tablet Per Tube BID   sodium chloride flush  10-40 mL Intracatheter Q12H   sodium chloride flush  3 mL Intravenous Q12H    Assessment/Plan **AHRF, multifocal PNA: on significant vent support per primary currently. Steroids, empiric abx. Volume management with CRRT -  hypotension has been limiting UF, will attempt to use vasopressor support to facilitate UF.  Eval for DAH/alveolitis per primary - doesn't look like autoimmune issue.    **AKI, severe, oliguric:  Multifactorial with contributions of contrast, hypotension > ATN.  Developed  hyperkalemia, concern for AMS from uremia and was started on CRRT 10/3.   Renal US 10/3 with BL nonobstructing stones.  UA with hematuria - foley sample.  With DAH noted c/f vasculitis but her presenting creatinine was normal and her course of AKI is more consistent with hemodynamic insults and contrast nephropathy than a GN; I do not think a kidney biopsy is indicated nor is she stable enough for such.  --Cont CRRT today --Use vasopressor support to help facilitate UF today - would like to start making some progress on UF  --Avoid nephrotoxins and hypotension as able   **Hyperkalemia:  improved with CRRT   **A fib: on amiodarone; in SB currently.  Cardiology following.    **Pericardial effusion:  no tamponade and smaller on exam 10/3.  The effusion preceeded AKI/significant BUN elevation so not consistent with uremic etiology.  Thought to be COVID vaccine related.  Jannifer Hick MD 05/10/2021, 11:28 AM  [facility] Pager: [phone]

## 2021-05-10 NOTE — Progress Notes (Signed)
Progress Note  Patient Name: Lisa Hamilton Date of Encounter: 05/10/2021  [facility] Cardiologist: None   Subjective   Intubated and sedated.  Unable to assess. Opens eyes to voice.   Inpatient Medications    Scheduled Meds:  arformoterol  15 mcg Nebulization BID   artificial tears  1 application Both Eyes [id number]   B-complex with vitamin C  1 tablet Per Tube Daily   budesonide (PULMICORT) nebulizer solution  0.5 mg Nebulization BID   chlorhexidine gluconate (MEDLINE KIT)  15 mL Mouth Rinse BID   Chlorhexidine Gluconate Cloth  6 each Topical Daily   clonazePAM  1 mg Per Tube QHS   feeding supplement (PROSource TF)  45 mL Per Tube BID   gabapentin  100 mg Per Tube Q8H   heparin injection (subcutaneous)  5,000 Units Subcutaneous Q8H   insulin aspart  0-20 Units Subcutaneous Q4H   levETIRAcetam  500 mg Per Tube BID   mouth rinse  15 mL Mouth Rinse 10 times per day   montelukast  10 mg Per Tube QPM   pantoprazole sodium  40 mg Per Tube Daily   polyethylene glycol  17 g Per Tube Daily   pravastatin  40 mg Per Tube Daily   predniSONE  40 mg Per Tube Q breakfast   revefenacin  175 mcg Nebulization Daily   senna-docusate  2 tablet Per Tube BID   sodium chloride flush  10-40 mL Intracatheter Q12H   sodium chloride flush  3 mL Intravenous Q12H   Continuous Infusions:  sodium chloride 10 mL/hr at 05/10/21 0900   ceFEPime (MAXIPIME) IV Stopped (05/10/21 5883)   epinephrine 3 mcg/min (05/10/21 0900)   feeding supplement (VITAL 1.5 CAL) 1,000 mL (05/08/21 1213)   fentaNYL infusion INTRAVENOUS 100 mcg/hr (05/10/21 0900)   heparin 10,000 units/ 20 mL infusion syringe 500 Units/hr (05/09/21 1735)   isoproterenol (ISUPREL) infusion 2 mcg/min (05/10/21 0905)   midazolam Stopped (05/10/21 0527)   norepinephrine (LEVOPHED) Adult infusion Stopped (05/08/21 1151)   prismasol BGK 2/2.5 dialysis solution 2,000 mL/hr at 05/10/21 0909   prismasol BGK 2/2.5 replacement solution 200 mL/hr at  05/09/21 1704   prismasol BGK 2/2.5 replacement solution 300 mL/hr at 05/09/21 2016   propofol (DIPRIVAN) infusion Stopped (05/18/2021 1203)   PRN Meds: acetaminophen **OR** acetaminophen, albuterol, atropine, bisacodyl, fentaNYL (SUBLIMAZE) injection, fentaNYL (SUBLIMAZE) injection, heparin, heparin sodium (porcine), hydrALAZINE, midazolam, [DISCONTINUED] ondansetron **OR** ondansetron (ZOFRAN) IV, rocuronium bromide, sodium chloride, sodium chloride flush   Vital Signs    Vitals:   05/10/21 0845 05/10/21 0900 05/10/21 0915 05/10/21 0930  BP: (!) 107/46 (!) 130/51  (!) 137/53  Pulse: 62 84 90 76  Resp: (!) 25 20 (!) 26 (!) 25  Temp: (!) 97 F (36.1 C) (!) 97 F (36.1 C) (!) 97 F (36.1 C) (!) 97 F (36.1 C)  TempSrc:      SpO2: 100% 100% 97% 92%  Weight:      Height:        Intake/Output Summary (Last 24 hours) at 05/10/2021 0952 Last data filed at 05/10/2021 0900 Gross per 24 hour  Intake 2947.24 ml  Output 3329 ml  Net -381.76 ml   Last 3 Weights 05/09/2021 05/05/2021 05/06/2021  Weight (lbs) 243 lb 9.7 oz 242 lb 15.2 oz 238 lb 8.6 oz  Weight (kg) 110.5 kg 110.2 kg 108.2 kg      Telemetry    Sinus bradycardia.  PACs.   - Personally Reviewed  ECG  Sinus bradycardia.  Rate 58 bpm.  Low voltage. - Personally Reviewed  Physical Exam   VS:  BP (!) 137/53   Pulse 76   Temp (!) 97 F (36.1 C)   Resp (!) 25   Ht 5' (1.524 m)   Wt 110.5 kg   SpO2 92%   BMI 47.58 kg/m  , BMI Body mass index is 47.58 kg/m. GENERAL: Critically ill-appearing intubated and sedated on vent.  Prone positioning. HEENT:Fundi not visualized, oral mucosa unremarkable NECK:  No jugular venous distention, waveform within normal limits, carotid upstroke brisk and symmetric, no bruits, no thyromegaly LUNGS: Vented breath sounds.   HEART:  RRR.  PMI not displaced or sustained,S1 and S2 within normal limits, no S3, no S4, no clicks, no rubs, no murmurs ABD:  Flat, positive bowel sounds normal in  frequency in pitch, no bruits, no rebound, no guarding, no midline pulsatile mass, no hepatomegaly, no splenomegaly EXT:  2 plus pulses throughout, bilateral UE edema, no cyanosis no clubbing SKIN:  No rashes no nodules NEURO: Opens eyes to voice. Moves all four extremities  PSYCH:  unable to assess  Labs    High Sensitivity Troponin:   Recent Labs  Lab 04/11/2021 1847  TROPONINIHS 10     Chemistry Recent Labs  Lab 06/03/2021 0419 05/16/2021 0546 05/08/21 0508 05/08/21 0926 05/09/21 0500 05/09/21 0823 05/09/21 1629 05/09/21 2126 05/10/21 0136 05/10/21 0501 05/10/21 0603  NA 141   < > 139   < > 136  --  135  --   --  137 136  K 5.8*   < > 5.9*   < > 4.7   < > 5.7*  5.7*   < > 4.4 4.4 4.4  CL 101   < > 99   < > 99  --  100  --   --  100  --   CO2 32   < > 29   < > 27  --  25  --   --  28  --   GLUCOSE 151*   < > 107*   < > 145*  --  259*  --   --  195*  --   BUN 90*   < > 78*   < > 57*  --  63*  --   --  46*  --   CREATININE 3.10*   < > 2.85*   < > 2.17*  --  2.42*  --   --  1.72*  --   CALCIUM 7.9*   < > 7.3*   < > 7.4*  --  7.4*  --   --  7.5*  --   MG  --    < > 2.4  --  2.6*  --   --   --   --  2.3  --   PROT 5.0*  --   --   --   --   --   --   --   --   --   --   ALBUMIN 2.1*   < > 2.1*   < > 2.2*  --  1.9*  --   --  1.8*  --   AST 26  --   --   --   --   --   --   --   --   --   --   ALT 22  --   --   --   --   --   --   --   --   --   --  ALKPHOS 95  --   --   --   --   --   --   --   --   --   --   BILITOT 0.7  --   --   --   --   --   --   --   --   --   --   GFRNONAA 15*   < > 17*   < > 23*  --  21*  --   --  31*  --   ANIONGAP 8   < > 11   < > 10  --  10  --   --  9  --    < > = values in this interval not displayed.    Lipids No results for input(s): CHOL, TRIG, HDL, LABVLDL, LDLCALC, CHOLHDL in the last 168 hours.  Hematology Recent Labs  Lab 05/08/21 0508 05/09/21 0005 05/09/21 0500 05/10/21 0501 05/10/21 0603  WBC 18.8*  --  18.5* 12.2*  --   RBC  3.50*  --  3.52* 2.94*  --   HGB 10.2*   < > 10.4* 8.7* 9.9*  HCT 35.1*   < > 35.5* 29.3* 29.0*  MCV 100.3*  --  100.9* 99.7  --   MCH 29.1  --  29.5 29.6  --   MCHC 29.1*  --  29.3* 29.7*  --   RDW 15.1  --  15.2 15.0  --   PLT 204  --  163 PLATELET CLUMPS NOTED ON SMEAR, UNABLE TO ESTIMATE  --    < > = values in this interval not displayed.   Thyroid No results for input(s): TSH, FREET4 in the last 168 hours.  BNP No results for input(s): BNP, PROBNP in the last 168 hours.   DDimer  No results for input(s): DDIMER in the last 168 hours.    Radiology    DG Chest 1 View  Result Date: 05/08/2021 CLINICAL DATA:  Check chest tube placement EXAM: PORTABLE CHEST 1 VIEW COMPARISON:  Film from earlier in the same day. FINDINGS: Cardiac shadow is stable. Endotracheal tube, gastric catheter and bilateral jugular central lines are again seen and stable. New pigtail catheter is noted on the left with reduction in left-sided pleural effusion. Persistent central vascular congestion is noted as well as patchy airspace disease predominantly in the right lung. Small right effusion is noted as well. No new focal abnormality is seen. No pneumothorax is noted. IMPRESSION: Interval placement of left-sided chest tube with decrease in left pleural effusion and improved aeration. Remainder of the exam is stable.  No pneumothorax is noted. Electronically Signed   By: Inez Catalina M.D.   On: 05/08/2021 19:02   CT CHEST WO CONTRAST  Result Date: 05/08/2021 CLINICAL DATA:  Hemoptysis. EXAM: CT CHEST WITHOUT CONTRAST TECHNIQUE: Multidetector CT imaging of the chest was performed following the standard protocol without IV contrast. COMPARISON:  None. FINDINGS: Cardiovascular: Right-sided venous catheters are in place. There is moderate severity calcification of the aortic arch. Moderate to marked severity cardiomegaly is seen. A large, predominantly posterior pericardial effusion is seen (approximately 1.9 cm in  thickness). Mediastinum/Nodes: There is mild AP window lymphadenopathy. Thyroid gland, trachea, and esophagus demonstrate no significant findings. Lungs/Pleura: Endotracheal and nasogastric tubes are in place. Marked severity infiltrates are seen involving the bilateral upper lobes and right middle lobe. Marked severity consolidation is seen throughout the bilateral lower lobes. There is a small left pleural effusion with suspected loculated  components. A moderate to large right pleural effusion is also seen. A moderate sized pneumothorax is seen along the anteromedial aspect of the mid to upper left lung. This measures approximately 3.5 cm in AP measurement and extends from the region near the apex to the mid left lung. An additional small pneumothorax is seen along the anteromedial aspect of the left apex. This measures 6 mm in thickness. Upper Abdomen: An 8 mm cystic appearing area is noted within the liver dome. A layer of gallstones and/or sludge is seen within the gallbladder lumen. Musculoskeletal: Multilevel degenerative changes seen throughout the thoracic spine. IMPRESSION: 1. Small to moderate sized anterior left-sided pneumothorax. 2. Marked severity bilateral upper lobe and right middle lobe infiltrates with marked severity bilateral lower lobe consolidation. 3. Bilateral pleural effusions, right larger than left, with suspected loculated components on the left. 4. Large, predominantly posterior pericardial effusion. 5. Cholelithiasis and/or sludge within the gallbladder lumen. 6. Aortic atherosclerosis. Aortic Atherosclerosis (ICD10-I70.0). Electronically Signed   By: Virgina Norfolk M.D.   On: 05/08/2021 19:48   DG CHEST PORT 1 VIEW  Result Date: 05/09/2021 CLINICAL DATA:  New dialysis catheter. EXAM: PORTABLE CHEST 1 VIEW COMPARISON:  May 08, 2021. FINDINGS: Stable cardiomegaly. Endotracheal and feeding tubes are in good position. Interval placement of right internal jugular catheter with  distal tip in expected position of cavoatrial junction. Left internal jugular catheter is noted as well with distal tip in expected position of cavoatrial junction. No pneumothorax is noted. Right lung opacity is noted concerning for worsening edema or pneumonia. Pleural effusion may be present. Left-sided chest tube is again noted without definite pneumothorax. IMPRESSION: Bilateral internal jugular catheters are noted with distal tips in expected position of cavoatrial junction. No pneumothorax is noted. Right lung opacity is noted concerning for worsening pneumonia or edema. Electronically Signed   By: Marijo Conception M.D.   On: 05/09/2021 17:32   DG Chest Port 1 View  Result Date: 05/08/2021 CLINICAL DATA:  Endotracheal tube. EXAM: PORTABLE CHEST 1 VIEW COMPARISON:  Chest x-ray 05/08/2021. FINDINGS: Examination is significantly limited secondary to patient positioning and overlying artifact. Endotracheal tube tip is likely at the level of the carina. Bilateral central venous catheter tips project over the presumed SVC, unchanged. Enteric tube extends below the diaphragm. Cardiomediastinal silhouette is grossly unchanged. There are increasing left lung infiltrates diffusely. Right lung is better aerated. Small pleural effusions persist. No definite pneumothorax or acute fracture. IMPRESSION: 1. Significantly limited examination. Endotracheal tube tip is likely at the level of the carina. 2. Other lines and tubes are grossly unchanged. 3. Increasing left lung airspace disease and decreasing right lung airspace disease may represent shifting edema. 4. Stable small pleural effusions. Electronically Signed   By: Ronney Asters M.D.   On: 05/08/2021 22:56   DG Abd Portable 1V  Result Date: 05/09/2021 CLINICAL DATA:  NG tube placement EXAM: PORTABLE ABDOMEN - 1 VIEW COMPARISON:  None. FINDINGS: Feeding tube with the tip projecting over the antrum of the stomach. No bowel dilatation to suggest obstruction. No  evidence of pneumoperitoneum, portal venous gas or pneumatosis. No pathologic calcifications along the expected course of the ureters. Bilateral small pleural effusions. Bilateral lower lung interstitial thickening and atelectasis. No acute osseous abnormality. IMPRESSION: Feeding tube with the tip projecting over the antrum of the stomach. Electronically Signed   By: Kathreen Devoid M.D.   On: 05/09/2021 15:14   ECHOCARDIOGRAM LIMITED  Result Date: 05/08/2021    ECHOCARDIOGRAM LIMITED REPORT  Patient Name:   MILAN CLARE Date of Exam: 05/08/2021 Medical Rec #:  [id number]    Height:       60.0 in Accession #:    [id number]   Weight:       242.9 lb Date of Birth:  Jun 20, 1946   BSA:          2.027 m Patient Age:    74 years     BP:           113/100 mmHg Patient Gender: F            HR:           123 bpm. Exam Location:  Inpatient Procedure: Limited Echo Indications:    Pericardial effusion  History:        Patient has prior history of Echocardiogram examinations, most                 recent 05/16/2021. Arrythmias:Atrial Fibrillation and                 Tachycardia; Risk Factors:Hypertension, Dyslipidemia and                 Obesity. Pneumothorax. Respiratory failure.  Sonographer:    Roosvelt Maser RDCS Referring Phys: [id number] Lorin Glass IMPRESSIONS  1. Left ventricular ejection fraction, by estimation, is 60 to 65%. The left ventricle has normal function. There is mild left ventricular hypertrophy.  2. Right ventricular systolic function is normal. The right ventricular size is normal.  3. Left atrial size was mildly dilated.  4. Right atrial size was mildly dilated.  5. Large circumferential effusion with dilated IVC on vent Diastolic RA collapse but no RV collpase and no AV valve respiratory variation. Overall appearance of effusion is similar to TTE done 06/02/2021 Largest in posterior dimension . Large pericardial effusion.  6. Patient on positive pressure ventilation . The inferior vena cava is dilated in size  with <50% respiratory variability, suggesting right atrial pressure of 15 mmHg. FINDINGS  Left Ventricle: Left ventricular ejection fraction, by estimation, is 60 to 65%. The left ventricle has normal function. The left ventricular internal cavity size was normal in size. There is mild left ventricular hypertrophy. Right Ventricle: The right ventricular size is normal. No increase in right ventricular wall thickness. Right ventricular systolic function is normal. Left Atrium: Left atrial size was mildly dilated. Right Atrium: Right atrial size was mildly dilated. Pericardium: Large circumferential effusion with dilated IVC on vent Diastolic RA collapse but no RV collpase and no AV valve respiratory variation. Overall appearance of effusion is similar to TTE done 06/02/2021 Largest in posterior dimension. A large pericardial effusion is present. Venous: Patient on positive pressure ventilation. The inferior vena cava is dilated in size with less than 50% respiratory variability, suggesting right atrial pressure of 15 mmHg. IVC IVC diam: 3.00 cm Charlton Haws MD Electronically signed by Charlton Haws MD Signature Date/Time: 05/08/2021/5:21:29 PM    Final     Cardiac Studies   Echo 05/05/21: IMPRESSIONS    1. Left ventricular ejection fraction, by estimation, is 60 to 65%. The  left ventricle has normal function. The left ventricle has no regional  wall motion abnormalities. Left ventricular diastolic parameters are  indeterminate.   2. Right ventricular systolic function is normal. The right ventricular  size is normal. Tricuspid regurgitation signal is inadequate for assessing  PA pressure.   3. The mitral valve is normal in structure. No evidence of mitral valve  regurgitation. No evidence of mitral stenosis.   4. The aortic valve is tricuspid. Aortic valve regurgitation is not  visualized. Mild aortic valve sclerosis is present, with no evidence of  aortic valve stenosis.   5. The inferior vena cava is  normal in size with greater than 50%  respiratory variability, suggesting right atrial pressure of 3 mmHg.   6. Large pericardial effusion. The pericardial effusion is  circumferential. The effusion measures 2.42cm at greatest diameter  posteriorly. There is RA inversion but no RV diastolic collapse and the  IVC is normal size and collapses > 50% with expiration.   There is normal MV inflow pattern with respirations. No findings of  impending tamponade but this is a clinical diagnosis made at the bedside.   Echo 05/06/2021: IMPRESSIONS     1. Large pericardial effusion (largest in posterior distribution); mild  RA collapse but no RV collapse; no respirtaory flow variation; IVC dilated  but pt on ventilator; no obvious tamponade physiology; findings similar to  05/05/21.   2. Left ventricular ejection fraction, by estimation, is 55 to 60%. The  left ventricle has normal function. The left ventricle has no regional  wall motion abnormalities.   3. Right ventricular systolic function is normal. The right ventricular  size is normal.   4. Large pericardial effusion.   5. The mitral valve is normal in structure.   6. The aortic valve is tricuspid. Mild aortic valve sclerosis is present,  with no evidence of aortic valve stenosis.   Patient Profile     75 y.o. female with COPD, hypertension, hyperlipidemia, and GERD admitted with acute respiratory failure.  She subsequently developed bradycardia and asystolic arrest.  She was intubated and resuscitated for 15 minutes.  She had a seizure on 9/30 and transient atrial fibrillation.  Cardiology was consulted for pericardial effusion.  Assessment & Plan    # Pericardial effusion: Over the weekend and plans were made for pericardiocentesis.  She has a large pericardial effusion that is mostly posterior.  No evidence of tamponade. Unchanged on repeated echo.  On review by the Cath Lab team it was not thought to be favorable for pericardiocentesis.   She remains hypotensive and requiring pressors.  Isuprel was started overnight for bradycardia.  She has already been on epinephrine.  HR has stabilized enough for fluid removal with CVVHD.  Pericardial effusion likely vaccine related.   #Paroxysmal atrial fibrillation: She has been in and out of atrial fibrillation.  Amiodarone stopped due to bradycardia. Given her recurrent atrial fibrillation, would recommend heparin.  However, hgb is down to 8.7 today from 10.4 yesterday.  Therefore, continue to avoid heparin until this is clarified.   #AKI: Now on CVVHD and tolerating fluid removal.   Total critical care time: 40 minutes. Critical care time was exclusive of separately billable procedures and treating other patients. Critical care was necessary to treat or prevent imminent or life-threatening deterioration. Critical care was time spent personally by me on the following activities: development of treatment plan with patient and/or surrogate as well as nursing, discussions with consultants, evaluation of patient's response to treatment, examination of patient, obtaining history from patient or surrogate, ordering and performing treatments and interventions, ordering and review of laboratory studies, ordering and review of radiographic studies, pulse oximetry and re-evaluation of patient's condition.    For questions or updates, please contact [place] Please consult [email or web address] for contact info under        Signed, Skeet Latch, MD  05/10/2021,  9:52 AM

## 2021-05-10 NOTE — Progress Notes (Signed)
NAME:  Lisa Hamilton, MRN:  [id number], DOB:  [birth year], LOS: 7 ADMISSION DATE:  04/17/2021, CONSULTATION DATE:  9/29 REFERRING MD:  Dr. Lorin Mercy, CHIEF COMPLAINT:  Cardiac arrest   History of Present Illness:  75 yr old female followed by Dr. Keturah Barre for COPD from asthma and chronic respiratory failure on 2 to 3 liters oxygen developed progressive cough, dyspnea, wheezing, and weakness.  Her husband reports she received COVID vaccine booster 2 days prior to developing symptoms.  She presented to ER on 9/28 and started on Bipap for acute on chronic hypoxic/hypercapnic respiratory failure.  She was started on steroids and antibiotics.  Had initial improvement and transitioned off Bipap per patient request.  Several hours later she then developed bradycardia leading to asystolic cardiac arrest.  Had ROSC after about 15 minutes.  Intubated and transferred to ICU.  Pertinent  Medical History  Anemia, Diverticulosis, DJD, GERD, Glaucoma, Hepatic cyst, Hiatal hernia, Colon polyps, HLD, HTN, Medullary sponge kidney, Goiter, Osteoporosis, SBO, Nephrolithiasis, RLS, Shingles, Vit D deficiency, prediabetes,  Significant Hospital Events: Including procedures, antibiotic start and stop dates in addition to other pertinent events   9/28 Admit 9/29 respiratory leading to cardiac arrest, intubated and transferred to ICU 9/30 tonic-clonic seizure and started on keppra, developed transient A fib and started on amiodarone, started on peripheral phenylephrine, Lt IJ CVL placed 10/1 back in sinus rhythm, off pressors, remains on increased PEEP/FiO2 10/3 - HD cath placed, started CRRT 10/04 - Chest tube placed for dependent consolidation.  10/05 - HD line clotted, replaced HD line in Rt IJ  Interim History / Subjective:  Patient resting in bed.  She is awake but intubated and denies any pain or new symptoms.   Objective   Blood pressure (!) 102/52, pulse (!) 58, temperature (!) 97 F (36.1 C), resp. rate (!)  6, height 5' (1.524 m), weight 110.5 kg, SpO2 100 %. CVP:  [9 mmHg-15 mmHg] 14 mmHg  Vent Mode: PRVC FiO2 (%):  [100 %] 100 % Set Rate:  [20 bmp-25 bmp] 25 bmp Vt Set:  [370 mL] 370 mL PEEP:  [10 cmH20] 10 cmH20 Plateau Pressure:  [15 cmH20-28 cmH20] 15 cmH20   Intake/Output Summary (Last 24 hours) at 05/10/2021 0708 Last data filed at 05/10/2021 0700 Gross per 24 hour  Intake 3156.82 ml  Output 2739 ml  Net 417.82 ml   Filed Weights   05/06/21 0500 05/20/2021 0500 05/09/21 0500  Weight: 108.2 kg 110.2 kg 110.5 kg    Examination: General -ill-appearing patient, alert but intubated with a RASS of 0 to -1 Eyes -extraocular motion intact ENT - ETT in place Cardiac -regular rate and rhythm no murmurs rubs or gallops Chest - scattered rhonchi and wheezing Abdomen -d soft nontender Extremities -continues to have 1+ edema, warm Skin - no rashes Neuro - RASS 0 to -1   Patient Lines/Drains/Airways Status     Active Line/Drains/Airways     Name Placement date Placement time Site Days   Peripheral IV 05/03/21 20 G Right Antecubital 05/03/21  1710  Antecubital  3   Peripheral IV 05/04/21 20 G 2.5" Anterior;Left;Proximal;Upper Arm 05/04/21  0415  Arm  2   CVC Triple Lumen 05/04/21 Left Internal jugular 05/04/21  1800  -- 2   NG/OG Vented/Dual Lumen Oral 05/03/21  1900  Oral  3   Urethral Catheter  Temperature probe 05/04/21  0230  Temperature probe  2   Airway 7.5 mm 05/03/21  2310  -- 3  Incision (Closed) 10/15/17 Abdomen 10/15/17  1028  -- 1299   Incision (Closed) 12/04/17 Abdomen 12/04/17  1241  -- 1249   Incision - 5 Ports Abdomen 1: Umbilicus 2: Right 3: Left;Upper 4: Left;Mid 5: Left;Lower 12/04/17  1450  -- 1249   Wound / Incision (Open or Dehisced) 12/10/17 Incision - Open Abdomen Lower;Medial;Right JP drain site 12/10/17  2040  Abdomen  1243                  Resolved Hospital Problem list   Cardiogenic shock  Assessment & Plan:   75 yr old female with a pPMH of  COPD (3L baseline), anemia, HLD, HTN, and prediabetes who presents with respiratory distress and admitted for acute hypoxic respiratory failure complicated by aspiration pneumonia with pulmonary arrests requiring intubation and mechanical ventilation and acute kidney failure requiring CRRT.  Acute on chronic hypoxic/hypercapnic respiratory failure, Multifactorial Pulmonary edema: Alveolar hemorrhage/Possible alveolitis: Respiratory Arrest: Patient respiratory function stable.  We will try to titrate as we pull fluid off with CRRT.  We will continue systemic steroids due to history of hemoptysis while autoimmune work-up is pending.  Hemoptysis improved therefore doubtful for DAH.  Continues to have wheezing likely secondary to fluid but will continue treatment for COPD.  Unlikely further infection as she has received 9 days of antibiotics with azithromycin and ceftriaxone and cefepime and bank.  She is currently on cefepime will finish 5-day course on the seventh. - adjust PEEP/FiO2 to keep SpO2 > 92% - On cefepime with end date of 10/07 - Will continue systemic steroids, pending autoimmune work up for [facility] - Cont yupelri, brovana and continue pulmicort, singulair - prn albuterol  Pericardial Effusion 2/2 COVID vaccine rxn Stable pericardial effusion.  Patient remains hypertensive.  Appreciate cardiology assistance -Appreciate cardiology's assistance -Continue CRRT per nephrology  Intermittent Afib>> sinus bradycardia: Improvement of bradycardia since starting epi and isoproterenol.  Rates these as needed.  She remains in sinus rhythm currently. -Continue to hold amiodarone and home Cozaar -Continue epi/isopreterenol to hopefully improve rate and hypotension  Oliguric AKI 2/2 ATN: Patient had 2.7 L removed overnight pulling at 250 cc with minimal urine output. - Appreciate nephrologies assistance.  - Monitor urine output and kidney function daily - Will aim for net negative fluid removal.    Acute metabolic encephalopathy from hypoxia, hypercapnia. Isolated seizure after cardiac arrest on 9/30. - RASS goal 0 to -1 - CT head normal - f/u EEG - continue keppra for now - continue klonopin, neurontin - consider holding requip due to bradycardia  Steroid induced hyperglycemia. - SSI  Hx of osteoporosis. - hold outpt fosamax  Best Practice (right click and "Reselect all SmartList Selections" daily)   Diet/type: tubefeeds DVT prophylaxis: prophylactic heparin  GI prophylaxis: PPI Lines: N/A 1.5 L removed yesterday.  She seems to be tolerating this fairly well.:  Yes, and it is still needed Code Status:  full code Family: Updated pt's husband at bedside on 05/04/21.  Labs    CMP Latest Ref Rng & Units 05/10/2021 05/10/2021 05/10/2021  Glucose 70 - 99 mg/dL - 195(H) -  BUN 8 - 23 mg/dL - 46(H) -  Creatinine 0.44 - 1.00 mg/dL - 1.72(H) -  Sodium 135 - 145 mmol/L 136 137 -  Potassium 3.5 - 5.1 mmol/L 4.4 4.4 4.4  Chloride 98 - 111 mmol/L - 100 -  CO2 22 - 32 mmol/L - 28 -  Calcium 8.9 - 10.3 mg/dL - 7.5(L) -  Total Protein 6.5 -  8.1 g/dL - - -  Total Bilirubin 0.3 - 1.2 mg/dL - - -  Alkaline Phos 38 - 126 U/L - - -  AST 15 - 41 U/L - - -  ALT 0 - 44 U/L - - -    CBC Latest Ref Rng & Units 05/10/2021 05/10/2021 05/09/2021  WBC 4.0 - 10.5 K/uL - 12.2(H) 18.5(H)  Hemoglobin 12.0 - 15.0 g/dL 9.9(L) 8.7(L) 10.4(L)  Hematocrit 36.0 - 46.0 % 29.0(L) 29.3(L) 35.5(L)  Platelets 150 - 400 K/uL - PLATELET CLUMPS NOTED ON SMEAR, UNABLE TO ESTIMATE 163    ABG    Component Value Date/Time   PHART 7.283 (L) 05/10/2021 0603   PCO2ART 57.7 (H) 05/10/2021 0603   PO2ART 83 05/10/2021 0603   HCO3 27.6 05/10/2021 0603   TCO2 29 05/10/2021 0603   O2SAT 95.0 05/10/2021 0603    CBG (last 3)  Recent Labs    05/09/21 1933 05/09/21 2323 05/10/21 0344  GLUCAP 277* 215* 212*     Critical care time: [place], D.O.  Internal Medicine Resident, PGY-3 [facility]  Pager: [phone] 7:08 AM, 05/10/2021

## 2021-05-11 ENCOUNTER — Inpatient Hospital Stay (HOSPITAL_COMMUNITY): Payer: Medicare Other

## 2021-05-11 DIAGNOSIS — I469 Cardiac arrest, cause unspecified: Secondary | ICD-10-CM | POA: Diagnosis not present

## 2021-05-11 DIAGNOSIS — I10 Essential (primary) hypertension: Secondary | ICD-10-CM | POA: Diagnosis not present

## 2021-05-11 DIAGNOSIS — I3139 Other pericardial effusion (noninflammatory): Secondary | ICD-10-CM | POA: Diagnosis not present

## 2021-05-11 DIAGNOSIS — J9621 Acute and chronic respiratory failure with hypoxia: Secondary | ICD-10-CM | POA: Diagnosis not present

## 2021-05-11 LAB — RENAL FUNCTION PANEL
Albumin: 1.8 g/dL — ABNORMAL LOW (ref 3.5–5.0)
Albumin: 2.1 g/dL — ABNORMAL LOW (ref 3.5–5.0)
Anion gap: 8 (ref 5–15)
Anion gap: 5 (ref 5–15)
BUN: 26 mg/dL — ABNORMAL HIGH (ref 8–23)
BUN: 25 mg/dL — ABNORMAL HIGH (ref 8–23)
CO2: 25 mmol/L (ref 22–32)
CO2: 27 mmol/L (ref 22–32)
Calcium: 7.7 mg/dL — ABNORMAL LOW (ref 8.9–10.3)
Calcium: 7.7 mg/dL — ABNORMAL LOW (ref 8.9–10.3)
Chloride: 102 mmol/L (ref 98–111)
Chloride: 102 mmol/L (ref 98–111)
Creatinine, Ser: 1.17 mg/dL — ABNORMAL HIGH (ref 0.44–1.00)
Creatinine, Ser: 1.11 mg/dL — ABNORMAL HIGH (ref 0.44–1.00)
GFR, Estimated: 49 mL/min — ABNORMAL LOW (ref >60)
GFR, Estimated: 52 mL/min — ABNORMAL LOW (ref >60)
Glucose, Bld: 123 mg/dL — ABNORMAL HIGH (ref 70–99)
Glucose, Bld: 181 mg/dL — ABNORMAL HIGH (ref 70–99)
Phosphorus: 2.3 mg/dL — ABNORMAL LOW (ref 2.5–4.6)
Phosphorus: 2.4 mg/dL — ABNORMAL LOW (ref 2.5–4.6)
Potassium: 3.1 mmol/L — ABNORMAL LOW (ref 3.5–5.1)
Potassium: 4 mmol/L (ref 3.5–5.1)
Sodium: 135 mmol/L (ref 135–145)
Sodium: 134 mmol/L — ABNORMAL LOW (ref 135–145)

## 2021-05-11 LAB — ECHOCARDIOGRAM LIMITED
Height: 60 in
S' Lateral: 3.1 cm
Weight: 3897.73 oz

## 2021-05-11 LAB — CBC
HCT: 31.8 % — ABNORMAL LOW (ref 36.0–46.0)
Hemoglobin: 9.3 g/dL — ABNORMAL LOW (ref 12.0–15.0)
MCH: 29.2 pg (ref 26.0–34.0)
MCHC: 29.2 g/dL — ABNORMAL LOW (ref 30.0–36.0)
MCV: 99.7 fL (ref 80.0–100.0)
Platelets: 117 10*3/uL — ABNORMAL LOW (ref 150–400)
RBC: 3.19 MIL/uL — ABNORMAL LOW (ref 3.87–5.11)
RDW: 14.6 % (ref 11.5–15.5)
WBC: 13.7 10*3/uL — ABNORMAL HIGH (ref 4.0–10.5)
nRBC: 0.2 % (ref 0.0–0.2)

## 2021-05-11 LAB — GLUCOSE, CAPILLARY
Glucose-Capillary: 117 mg/dL — ABNORMAL HIGH (ref 70–99)
Glucose-Capillary: 142 mg/dL — ABNORMAL HIGH (ref 70–99)
Glucose-Capillary: 155 mg/dL — ABNORMAL HIGH (ref 70–99)
Glucose-Capillary: 147 mg/dL — ABNORMAL HIGH (ref 70–99)
Glucose-Capillary: 143 mg/dL — ABNORMAL HIGH (ref 70–99)

## 2021-05-11 LAB — MAGNESIUM: Magnesium: 2.5 mg/dL — ABNORMAL HIGH (ref 1.7–2.4)

## 2021-05-11 MED ORDER — PRISMASOL BGK 4/2.5 32-4-2.5 MEQ/L REPLACEMENT SOLN
Status: DC
  Filled 2021-05-11 (×9): qty 5000

## 2021-05-11 MED ORDER — PRISMASOL BGK 4/2.5 32-4-2.5 MEQ/L EC SOLN
Status: DC
  Filled 2021-05-11 (×47): qty 5000

## 2021-05-11 MED ORDER — POTASSIUM CHLORIDE 20 MEQ PO PACK
20.0000 meq | PACK | Freq: Once | ORAL | Status: AC
  Administered 2021-05-11: 20 meq
  Filled 2021-05-11: qty 1

## 2021-05-11 NOTE — Progress Notes (Signed)
NAME:  Lisa Hamilton, MRN:  [id number], DOB:  Oct 04, 1945, LOS: 27 ADMISSION DATE:  04/26/2021, CONSULTATION DATE:  9/29 REFERRING MD:  Dr. Lorin Mercy, CHIEF COMPLAINT:  Cardiac arrest   History of Present Illness:  75 yr old female followed by Dr. Keturah Barre for COPD from asthma and chronic respiratory failure on 2 to 3 liters oxygen developed progressive cough, dyspnea, wheezing, and weakness.  Her husband reports she received COVID vaccine booster 2 days prior to developing symptoms.  She presented to ER on 9/28 and started on Bipap for acute on chronic hypoxic/hypercapnic respiratory failure.  She was started on steroids and antibiotics.  Had initial improvement and transitioned off Bipap per patient request.  Several hours later she then developed bradycardia leading to asystolic cardiac arrest.  Had ROSC after about 15 minutes.  Intubated and transferred to ICU.  Pertinent  Medical History  Anemia, Diverticulosis, DJD, GERD, Glaucoma, Hepatic cyst, Hiatal hernia, Colon polyps, HLD, HTN, Medullary sponge kidney, Goiter, Osteoporosis, SBO, Nephrolithiasis, RLS, Shingles, Vit D deficiency, prediabetes,  Significant Hospital Events: Including procedures, antibiotic start and stop dates in addition to other pertinent events   9/28 Admit 9/29 respiratory leading to cardiac arrest, intubated and transferred to ICU 9/30 tonic-clonic seizure and started on keppra, developed transient A fib and started on amiodarone, started on peripheral phenylephrine, Lt IJ CVL placed 10/1 back in sinus rhythm, off pressors, remains on increased PEEP/FiO2 10/3 - HD cath placed, started CRRT 10/04 - Chest tube placed for dependent consolidation.  10/05 - HD line clotted, replaced HD line in Rt IJ 10/06 - recurrent afib with rvr and titrating down Iso 10/07 titrated off pressors  Interim History / Subjective:  Patient resting in bed and sleepy with ETT in place. She denies pain or new symptoms.   Objective   Blood  pressure (!) 109/58, pulse 66, temperature 97.7 F (36.5 C), temperature source Axillary, resp. rate (!) 25, height 5' (1.524 m), weight 110.5 kg, SpO2 93 %. CVP:  [14 mmHg-15 mmHg] 15 mmHg  Vent Mode: PRVC FiO2 (%):  [50 %-100 %] 50 % Set Rate:  [25 bmp] 25 bmp Vt Set:  [370 mL] 370 mL PEEP:  [10 cmH20] 10 cmH20 Plateau Pressure:  [23 cmH20-26 cmH20] 26 cmH20   Intake/Output Summary (Last 24 hours) at 05/11/2021 0635 Last data filed at 05/11/2021 0600 Gross per 24 hour  Intake 2585.85 ml  Output 4250 ml  Net -1664.15 ml   Filed Weights   05/06/21 0500 05/18/2021 0500 05/09/21 0500  Weight: 108.2 kg 110.2 kg 110.5 kg    Examination: General -ill-appearing patient, alert but intubated with a RASS of 0 to -1 Eyes -extraocular motion intact ENT - ETT in place Cardiac -regular rate and rhythm no murmurs rubs or gallops Chest - scattered rhonchi and wheezing Abdomen - soft, nontender Extremities -continues to have 1+ edema in distal extremities, warm and dry Skin - no rashes Neuro - RASS 0 to -1   Patient Lines/Drains/Airways Status     Active Line/Drains/Airways     Name Placement date Placement time Site Days   Peripheral IV 05/03/21 20 G Right Antecubital 05/03/21  1710  Antecubital  3   Peripheral IV 05/04/21 20 G 2.5" Anterior;Left;Proximal;Upper Arm 05/04/21  0415  Arm  2   CVC Triple Lumen 05/04/21 Left Internal jugular 05/04/21  1800  -- 2   NG/OG Vented/Dual Lumen Oral 05/03/21  1900  Oral  3   Urethral Catheter  Temperature probe 05/04/21  0230  Temperature probe  2   Airway 7.5 mm 05/03/21  2310  -- 3   Incision (Closed) 10/15/17 Abdomen 10/15/17  1028  -- 1299   Incision (Closed) 12/04/17 Abdomen 12/04/17  1241  -- 1249   Incision - 5 Ports Abdomen 1: Umbilicus 2: Right 3: Left;Upper 4: Left;Mid 5: Left;Lower 12/04/17  1450  -- 1249   Wound / Incision (Open or Dehisced) 12/10/17 Incision - Open Abdomen Lower;Medial;Right JP drain site 12/10/17  2040  Abdomen  1243                   Resolved Hospital Problem list   Cardiogenic shock  Assessment & Plan:  75 yr old female with a pPMH of COPD (3L baseline), anemia, HLD, HTN, and prediabetes who presents with respiratory distress and admitted for acute hypoxic respiratory failure complicated by aspiration pneumonia with pulmonary arrests requiring intubation and mechanical ventilation and acute kidney failure requiring CRRT.  Acute on chronic hypoxic/hypercapnic respiratory failure, Multifactorial Pulmonary edema: Possible Aspiration pneumonia Alveolar hemorrhage/Possible alveolitis: Respiratory Arrest: Patient respiratory function stable.  Was able to wean PEEP to 10 and FiO2 to 50%.  Breathing improved with good fluid removal from CRRT.  He has had roughly 4.5 L off with no urine output.  She remains on systemic steroids due to history of hemoptysis but will likely discontinue tomorrow.  Additionally, she finished a 5-day course of cefepime for possible aspiration.  Patient has a chest tube in place with minimal output secondary to pneumothorax with dependent consolidation/loculated effusion. - adjust PEEP/FiO2 to keep SpO2 > 92% - On cefepime with end date of 10/07 - Will continue systemic steroids, end date 10/ - Cont yupelri, brovana and continue pulmicort, singulair - prn albuterol  Pericardial Effusion 2/2 COVID vaccine rxn Stable pericardial effusion.  Patient remains hypertensive.  Appreciate cardiology assistance -Appreciate cardiology's assistance -Continue CRRT per nephrology  Intermittent Afib: Hypotension: Patient had an episode of A. fib with RVR that is since improved since titrating off of isopreterenol, and epi.  She appears to be in sinus rhythm at this time.  We will continue to monitor for atrial fibrillation and consider starting anticoagulation as she has a CHA2DS2-VASc of 3 putting her at risk for CVA. Her blood pressure is significantly improved with maps in the 70s and off  of pressors. -Hold home Cozaar -Monitor off of pressors -Continue telemetry monitor for intermittent atrial fibrillation -Consider adding full anticoagulation  Oliguric AKI 2/2 ATN: Patient had 4.5 L removed overnight pulling at 250 cc with minimal urine output.  She remains 7.8 L net positive since admission - Appreciate nephrologies assistance.  - Monitor urine output and kidney function daily - Will aim for net negative fluid removal.   Constipation: History of small bowel obstruction: Patient has not had a bowel movement for several days despite multiple laxatives.  Overnight she had multiple episodes of diarrhea without true formed stool.  Her abdomen does appear to be somewhat distended, but she has denied any pain. - Started lactulose TID, titrate as needed - Consider KUB  Acute metabolic encephalopathy from hypoxia, hypercapnia. Isolated seizure after cardiac arrest on 9/30. - RASS goal 0 to -1 - CT head normal - f/u EEG - continue keppra for now - continue klonopin, neurontin - consider holding requip due to bradycardia  Steroid induced hyperglycemia. - SSI - Goal of 120 o 180  Hx of osteoporosis. - hold outpt fosamax  Best Practice (right click and "Reselect all SmartList  Selections" daily)  Diet/type: tubefeeds DVT prophylaxis: prophylactic heparin  GI prophylaxis: PPI Lines: N/A 1.5 L removed yesterday.  She seems to be tolerating this fairly well.:  Yes, and it is still needed Code Status:  full code Family: Updated pt's husband at bedside on 05/04/21.  Labs    CMP Latest Ref Rng & Units 05/11/2021 05/10/2021 05/10/2021  Glucose 70 - 99 mg/dL 123(H) 203(H) -  BUN 8 - 23 mg/dL 26(H) 40(H) -  Creatinine 0.44 - 1.00 mg/dL 1.17(H) 1.37(H) -  Sodium 135 - 145 mmol/L 135 133(L) 136  Potassium 3.5 - 5.1 mmol/L 3.1(L) 4.7 4.4  Chloride 98 - 111 mmol/L 102 100 -  CO2 22 - 32 mmol/L 25 25 -  Calcium 8.9 - 10.3 mg/dL 7.7(L) 7.6(L) -  Total Protein 6.5 - 8.1 g/dL -  - -  Total Bilirubin 0.3 - 1.2 mg/dL - - -  Alkaline Phos 38 - 126 U/L - - -  AST 15 - 41 U/L - - -  ALT 0 - 44 U/L - - -    CBC Latest Ref Rng & Units 05/11/2021 05/10/2021 05/10/2021  WBC 4.0 - 10.5 K/uL 13.7(H) - 12.2(H)  Hemoglobin 12.0 - 15.0 g/dL 9.3(L) 9.9(L) 8.7(L)  Hematocrit 36.0 - 46.0 % 31.8(L) 29.0(L) 29.3(L)  Platelets 150 - 400 K/uL PENDING - PLATELET CLUMPS NOTED ON SMEAR, UNABLE TO ESTIMATE    ABG    Component Value Date/Time   PHART 7.283 (L) 05/10/2021 0603   PCO2ART 57.7 (H) 05/10/2021 0603   PO2ART 83 05/10/2021 0603   HCO3 27.6 05/10/2021 0603   TCO2 29 05/10/2021 0603   O2SAT 95.0 05/10/2021 0603    CBG (last 3)  Recent Labs    05/10/21 2006 05/10/21 2322 05/11/21 0428  GLUCAP 171* 159* 117*     Critical care time: [place], D.O.  Internal Medicine Resident, PGY-3 [facility]  Pager: [phone] 6:35 AM, 05/11/2021

## 2021-05-11 NOTE — Progress Notes (Signed)
Attending:    Subjective: 75 y/o female with a complicated medical history including severe COPD who was admitted in thesetting of respiratory insufficiency, then after admission aspirated, then had a respiratory arrest requiring intubation. Chest imaging has suggested multi-focal pneumonia.  She has had seizure activity, developed renal failure and required CRRT.  Has a pericardial effusion, felt to be related to COVID vaccination, no clinical evidence of tamponade.   Objective: Vitals:   05/11/21 0630 05/11/21 0700 05/11/21 0749 05/11/21 0800  BP:  (!) 88/49  (!) 103/50  Pulse:  65 67 67  Resp:  (!) 25 (!) 25 (!) 25  Temp: 97.7 F (36.5 C) (!) 95.9 F (35.5 C) (!) 95.7 F (35.4 C) (!) 95.7 F (35.4 C)  TempSrc: Axillary   Esophageal  SpO2:  92% 92% 93%  Weight:      Height:       Vent Mode: PRVC FiO2 (%):  [50 %-60 %] 50 % Set Rate:  [25 bmp] 25 bmp Vt Set:  [370 mL] 370 mL PEEP:  [10 cmH20] 10 cmH20 Plateau Pressure:  [23 cmH20-26 cmH20] 25 cmH20  Intake/Output Summary (Last 24 hours) at 05/11/2021 1000 Last data filed at 05/11/2021 0929 Gross per 24 hour  Intake 2633.68 ml  Output 4119 ml  Net -1485.32 ml    General:  Chronically ill appearing, in bed on vent HENT: NCAT ETT in place PULM: CTA B, vent supported breathing CV: RRR, no mgr GI: BS+, soft, nontender MSK: diminished bulk and tone Neuro: drowsy but will wake up to voice    CBC    Component Value Date/Time   WBC 13.7 (H) 05/11/2021 0523   RBC 3.19 (L) 05/11/2021 0523   HGB 9.3 (L) 05/11/2021 0523   HCT 31.8 (L) 05/11/2021 0523   PLT 117 (L) 05/11/2021 0523   MCV 99.7 05/11/2021 0523   MCH 29.2 05/11/2021 0523   MCHC 29.2 (L) 05/11/2021 0523   RDW 14.6 05/11/2021 0523   LYMPHSABS 0.4 (L) 05/05/2021 0330   MONOABS 0.5 05/05/2021 0330   EOSABS 0.0 05/05/2021 0330   BASOSABS 0.0 05/05/2021 0330    BMET    Component Value Date/Time   NA 135 05/11/2021 0523   NA 140 05/30/2015 0000   K 3.1 (L)  05/11/2021 0523   CL 102 05/11/2021 0523   CO2 25 05/11/2021 0523   GLUCOSE 123 (H) 05/11/2021 0523   BUN 26 (H) 05/11/2021 0523   BUN 20 05/25/2014 0000   CREATININE 1.17 (H) 05/11/2021 0523   CALCIUM 7.7 (L) 05/11/2021 0523   GFRNONAA 49 (L) 05/11/2021 0523   GFRAA >60 12/11/2017 0736    CXR images reviewed, left lung pigtail chest catheter in place, new left effusion vs atelectasis R lung  Impression/Plan: Acute respiratory failure with hypoxemia > has had high ventilator setting, this is due to aspiration pneumonitis and complicated by acute pulmonary edema, continue to wean vent per protocol.  Pleural effusion> monitor chest tube output on left Oliguric renal failure> continue CRRT, monitor UOP, CRRT per renal, favor continued volume removal Aspiration pneumonia> complete antibiotics today COPD exacerbation> continue steroids, continue brovana/pulmicort/yupelri ?Right pleural effusion> ultrasound R chest today showed that this is atelectasis vs consolidation Pericardial effusion> f/u limited echo this morning  We will plan to have a goals of care conversation with the patient's family today after the repeat echocardiogram has been completed.  Her prognosis is poor.  My cc time 35 minutes  [name], MD [facility] Pager: [phone]  Cell: [phone] After 7pm: [phone]

## 2021-05-11 NOTE — Progress Notes (Signed)
[facility] Progress Note   Subjective:   I/Os yesterday 2.6 / 4.2, stil net + 7.8 for admission. Down to 50% FiO2. SBp 80-130s, no vasopressors. Son and husband bedside.  Objective Vitals:   05/11/21 0630 05/11/21 0700 05/11/21 0749 05/11/21 0800  BP:  (!) 88/49  (!) 103/50  Pulse:  65 67 67  Resp:  (!) 25 (!) 25 (!) 25  Temp: 97.7 F (36.5 C) (!) 95.9 F (35.5 C) (!) 95.7 F (35.4 C) (!) 95.7 F (35.4 C)  TempSrc: Axillary   Esophageal  SpO2:  92% 92% 93%  Weight:      Height:       Physical Exam General: intubated, sedated Heart: NSR on monitor Lungs: coarse, FiO2 50%, L chest tube noted Extremities: 1+ diffuse edema Dialysis Access:  RIJ temp HD cath  Additional Objective Labs: Basic Metabolic Panel: Recent Labs  Lab 05/10/21 0501 05/10/21 0603 05/10/21 1625 05/11/21 0523  NA 137 136 133* 135  K 4.4 4.4 4.7 3.1*  CL 100  --  100 102  CO2 28  --  25 25  GLUCOSE 195*  --  203* 123*  BUN 46*  --  40* 26*  CREATININE 1.72*  --  1.37* 1.17*  CALCIUM 7.5*  --  7.6* 7.7*  PHOS 3.8  --  3.0 2.3*    Liver Function Tests: Recent Labs  Lab 05/24/2021 0419 05/18/2021 1600 05/10/21 0501 05/10/21 1625 05/11/21 0523  AST 26  --   --   --   --   ALT 22  --   --   --   --   ALKPHOS 95  --   --   --   --   BILITOT 0.7  --   --   --   --   PROT 5.0*  --   --   --   --   ALBUMIN 2.1*   < > 1.8* 2.1* 1.8*   < > = values in this interval not displayed.    No results for input(s): LIPASE, AMYLASE in the last 168 hours. CBC: Recent Labs  Lab 05/05/21 0330 05/06/21 0435 05/19/2021 0419 05/08/21 0508 05/09/21 0005 05/09/21 0500 05/10/21 0501 05/10/21 0603 05/11/21 0523  WBC 15.0*   < > 17.2* 18.8*  --  18.5* 12.2*  --  13.7*  NEUTROABS 14.1*  --   --   --   --   --   --   --   --   HGB 11.4*   < > 11.0* 10.2*   < > 10.4* 8.7* 9.9* 9.3*  HCT 38.8   < > 37.5 35.1*   < > 35.5* 29.3* 29.0* 31.8*  MCV 99.7   < > 100.3* 100.3*  --  100.9* 99.7  --   99.7  PLT 256   < > 190 204  --  163 PLATELET CLUMPS NOTED ON SMEAR, UNABLE TO ESTIMATE  --  117*   < > = values in this interval not displayed.    Blood Culture    Component Value Date/Time   SDES BRONCHIAL ALVEOLAR LAVAGE 05/09/2021 1721   SPECREQUEST NONE 05/06/2021 1721   CULT  05/18/2021 1721    Normal respiratory flora-no Staph aureus or Pseudomonas seen Performed at [facility], [place] [address], [place], [place] [address]    REPTSTATUS 05/09/2021 FINAL 06/02/2021 1721    Cardiac Enzymes: No results for input(s): CKTOTAL, CKMB, CKMBINDEX, TROPONINI in the last 168 hours. CBG:  Recent Labs  Lab 05/10/21 1522 05/10/21 2006 05/10/21 2322 05/11/21 0428 05/11/21 0747  GLUCAP 217* 171* 159* 117* 142*    Iron Studies: No results for input(s): IRON, TIBC, TRANSFERRIN, FERRITIN in the last 72 hours. $RemoveB'@lablastinr3'wpaEOLIZ$ @ Studies/Results: DG Chest Port 1 View  Result Date: 05/11/2021 CLINICAL DATA:  Pneumothorax. EXAM: PORTABLE CHEST 1 VIEW COMPARISON:  May 09, 2021. FINDINGS: Stable cardiomegaly. Endotracheal and feeding tubes are in good position. Bilateral internal jugular catheters are unchanged. Left-sided chest tube is unchanged. No pneumothorax is noted. Stable bilateral lung opacities are noted, right greater than left. Bony thorax is unremarkable. IMPRESSION: Stable support apparatus. Stable left-sided chest tube without pneumothorax. Stable bilateral lung opacities. Electronically Signed   By: Marijo Conception M.D.   On: 05/11/2021 07:59   DG CHEST PORT 1 VIEW  Result Date: 05/09/2021 CLINICAL DATA:  New dialysis catheter. EXAM: PORTABLE CHEST 1 VIEW COMPARISON:  May 08, 2021. FINDINGS: Stable cardiomegaly. Endotracheal and feeding tubes are in good position. Interval placement of right internal jugular catheter with distal tip in expected position of cavoatrial junction. Left internal jugular catheter is noted as well with distal tip in expected position of cavoatrial  junction. No pneumothorax is noted. Right lung opacity is noted concerning for worsening edema or pneumonia. Pleural effusion may be present. Left-sided chest tube is again noted without definite pneumothorax. IMPRESSION: Bilateral internal jugular catheters are noted with distal tips in expected position of cavoatrial junction. No pneumothorax is noted. Right lung opacity is noted concerning for worsening pneumonia or edema. Electronically Signed   By: Marijo Conception M.D.   On: 05/09/2021 17:32   DG Abd Portable 1V  Result Date: 05/09/2021 CLINICAL DATA:  NG tube placement EXAM: PORTABLE ABDOMEN - 1 VIEW COMPARISON:  None. FINDINGS: Feeding tube with the tip projecting over the antrum of the stomach. No bowel dilatation to suggest obstruction. No evidence of pneumoperitoneum, portal venous gas or pneumatosis. No pathologic calcifications along the expected course of the ureters. Bilateral small pleural effusions. Bilateral lower lung interstitial thickening and atelectasis. No acute osseous abnormality. IMPRESSION: Feeding tube with the tip projecting over the antrum of the stomach. Electronically Signed   By: Kathreen Devoid M.D.   On: 05/09/2021 15:14   Medications:   prismasol BGK 4/2.5      prismasol BGK 4/2.5     sodium chloride 10 mL/hr at 05/11/21 0900   ceFEPime (MAXIPIME) IV Stopped (05/11/21 7824)   epinephrine Stopped (05/10/21 2138)   feeding supplement (VITAL 1.5 CAL) 1,000 mL (05/10/21 1205)   fentaNYL infusion INTRAVENOUS 250 mcg/hr (05/11/21 0900)   heparin 10,000 units/ 20 mL infusion syringe 750 Units/hr (05/11/21 0524)   isoproterenol (ISUPREL) infusion Stopped (05/10/21 2104)   prismasol BGK 4/2.5      arformoterol  15 mcg Nebulization BID   B-complex with vitamin C  1 tablet Per Tube Daily   budesonide (PULMICORT) nebulizer solution  0.5 mg Nebulization BID   chlorhexidine gluconate (MEDLINE KIT)  15 mL Mouth Rinse BID   Chlorhexidine Gluconate Cloth  6 each Topical Daily    clonazePAM  1 mg Per Tube QHS   feeding supplement (PROSource TF)  45 mL Per Tube BID   gabapentin  100 mg Per Tube Q8H   heparin injection (subcutaneous)  5,000 Units Subcutaneous Q8H   insulin aspart  0-20 Units Subcutaneous Q4H   lactulose  20 g Per Tube TID   levETIRAcetam  500 mg Per Tube BID   mouth rinse  15 mL Mouth Rinse 10 times per day   montelukast  10 mg Per Tube QPM   pantoprazole sodium  40 mg Per Tube Daily   polyethylene glycol  17 g Per Tube BID   pravastatin  40 mg Per Tube Daily   predniSONE  40 mg Per Tube Q breakfast   revefenacin  175 mcg Nebulization Daily   senna-docusate  2 tablet Per Tube BID   sodium chloride flush  10-40 mL Intracatheter Q12H   sodium chloride flush  3 mL Intravenous Q12H    Assessment/Plan **AHRF, multifocal PNA: on significant vent support per primary currently. Steroids, empiric abx. Volume management with CRRT.  Eval for DAH/alveolitis per primary - doesn't look like vasculitis based on serologies.   **AKI, severe, oliguric:  Multifactorial with contributions of contrast, hypotension > ATN.  Developed hyperkalemia, concern for AMS from uremia and was started on CRRT 10/3.   Renal US 10/3 with BL nonobstructing stones.  UA with hematuria - foley sample.  With DAH noted c/f vasculitis but her presenting creatinine was normal and her course of AKI is more consistent with hemodynamic insults and contrast nephropathy than a GN; I do not think a kidney biopsy is indicated nor is she stable enough for such.  --Cont CRRT today but if remains hemodynamically stable would transition to iHD in coming days,  --cont current net 50-100 neg/hr; made good progress yesterday --Avoid nephrotoxins and hypotension as able   **Hyperkalemia:  now hypokalemic - change 2K dialysate to 4K and cont BID checks   **A fib: on amiodarone; in NSR currently.  Cardiology following.    **Pericardial effusion:  no tamponade and smaller on exam 10/3.  The effusion  preceeded AKI/significant BUN elevation so not consistent with uremic etiology.  Thought to be COVID vaccine related.  Jannifer Hick MD 05/11/2021, 9:26 AM  [facility] Pager: [phone]

## 2021-05-11 NOTE — Progress Notes (Signed)
Increased by MD

## 2021-05-11 NOTE — Progress Notes (Signed)
  Echocardiogram 2D Echocardiogram has been performed.  Lisa Hamilton 05/11/2021, 1:40 PM

## 2021-05-11 NOTE — Plan of Care (Signed)
  Interdisciplinary Goals of Care Family Meeting   Date carried out:: 05/11/2021  Location of the meeting: Bedside  Member's involved: Physician, Bedside Registered Nurse, and Family Member or next of kin  Durable Power of Attorney or acting medical decision maker: Son Lisa Hamilton, Husband Lisa Hamilton    Discussion: We discussed goals of care for Lisa Hamilton .  I explained that in the best case scenario she will take weeks to months to recover and will require a tracheostomy and transfer to [facility].  We discussed the fact that if she had to go through CPR at this point she would not survive hospital discharge.  They said she would not want to undergo CPR, but they need a few days to consider withdrawal of care.  Code status: Full DNR, plan continued full medical support for 2-3 more days then re-address goals of care  Disposition: Continue current acute care  Time spent for the meeting: 20 minutes  [name] 05/11/2021, 5:44 PM

## 2021-05-11 NOTE — Progress Notes (Signed)
eLink Physician-Brief Progress Note Patient Name: Lisa Hamilton DOB: Jun 17, 1946 MRN: [id number]   Date of Service  05/11/2021  HPI/Events of Note  Dr. Anastasia Pall note from earlier today indicates the family wishes to make the patient a DNR, however, continue present level of care including mechanical ventilation and vasopressors. I spoke with the patient's son, Lisa Hamilton, who verifies that is the understanding of the family and the family is in agreement with this course of action.  eICU Interventions  Will make the patient a partial code: No Code Blue, CPR, ACLS medications or Defibrillation. Will continue mechanical ventilation and vasopressors as needed.      Intervention Category Major Interventions: End of life / care limitation discussion  Lysle Dingwall 05/11/2021, 9:33 PM

## 2021-05-11 NOTE — Progress Notes (Signed)
Progress Note  Patient Name: Lisa Hamilton Date of Encounter: 05/11/2021  [facility] Cardiologist: None   Subjective   Intubated and sedated.  Unable to assess. Opens eyes to voice.   Inpatient Medications    Scheduled Meds:  arformoterol  15 mcg Nebulization BID   B-complex with vitamin C  1 tablet Per Tube Daily   budesonide (PULMICORT) nebulizer solution  0.5 mg Nebulization BID   chlorhexidine gluconate (MEDLINE KIT)  15 mL Mouth Rinse BID   Chlorhexidine Gluconate Cloth  6 each Topical Daily   clonazePAM  1 mg Per Tube QHS   feeding supplement (PROSource TF)  45 mL Per Tube BID   gabapentin  100 mg Per Tube Q8H   heparin injection (subcutaneous)  5,000 Units Subcutaneous Q8H   insulin aspart  0-20 Units Subcutaneous Q4H   lactulose  20 g Per Tube TID   levETIRAcetam  500 mg Per Tube BID   mouth rinse  15 mL Mouth Rinse 10 times per day   montelukast  10 mg Per Tube QPM   pantoprazole sodium  40 mg Per Tube Daily   polyethylene glycol  17 g Per Tube BID   pravastatin  40 mg Per Tube Daily   predniSONE  40 mg Per Tube Q breakfast   revefenacin  175 mcg Nebulization Daily   senna-docusate  2 tablet Per Tube BID   sodium chloride flush  10-40 mL Intracatheter Q12H   sodium chloride flush  3 mL Intravenous Q12H   Continuous Infusions:  sodium chloride 10 mL/hr at 05/11/21 0800   ceFEPime (MAXIPIME) IV Stopped (05/11/21 0762)   epinephrine Stopped (05/10/21 2138)   feeding supplement (VITAL 1.5 CAL) 1,000 mL (05/10/21 1205)   fentaNYL infusion INTRAVENOUS 250 mcg/hr (05/11/21 0800)   heparin 10,000 units/ 20 mL infusion syringe 750 Units/hr (05/11/21 0524)   isoproterenol (ISUPREL) infusion Stopped (05/10/21 2104)   prismasol BGK 2/2.5 dialysis solution 2,000 mL/hr at 05/11/21 0826   prismasol BGK 2/2.5 replacement solution 200 mL/hr at 05/10/21 1931   prismasol BGK 2/2.5 replacement solution 300 mL/hr at 05/09/21 2016   PRN Meds: acetaminophen **OR**  acetaminophen, albuterol, atropine, bisacodyl, fentaNYL (SUBLIMAZE) injection, fentaNYL (SUBLIMAZE) injection, heparin, heparin sodium (porcine), hydrALAZINE, midazolam, [DISCONTINUED] ondansetron **OR** ondansetron (ZOFRAN) IV, sodium chloride, sodium chloride flush   Vital Signs    Vitals:   05/11/21 0630 05/11/21 0700 05/11/21 0749 05/11/21 0800  BP:  (!) 88/49  (!) 103/50  Pulse:  65 67 67  Resp:  (!) 25 (!) 25 (!) 25  Temp: 97.7 F (36.5 C) (!) 95.9 F (35.5 C) (!) 95.7 F (35.4 C) (!) 95.7 F (35.4 C)  TempSrc: Axillary   Esophageal  SpO2:  92% 92% 93%  Weight:      Height:        Intake/Output Summary (Last 24 hours) at 05/11/2021 0854 Last data filed at 05/11/2021 0802 Gross per 24 hour  Intake 2658.88 ml  Output 4263 ml  Net -1604.12 ml   Last 3 Weights 05/09/2021 05/14/2021 05/06/2021  Weight (lbs) 243 lb 9.7 oz 242 lb 15.2 oz 238 lb 8.6 oz  Weight (kg) 110.5 kg 110.2 kg 108.2 kg      Telemetry    Sinus bradycardia.  PACs.   - Personally Reviewed  ECG    Sinus bradycardia.  Rate 58 bpm.  Low voltage. - Personally Reviewed  Physical Exam   VS:  BP (!) 103/50 (BP Location: Right Wrist)   Pulse  67   Temp (!) 95.7 F (35.4 C) (Esophageal)   Resp (!) 25   Ht 5' (1.524 m)   Wt 110.5 kg   SpO2 93%   BMI 47.58 kg/m  , BMI Body mass index is 47.58 kg/m. GENERAL: Critically ill-appearing intubated and sedated on vent.  HEENT:Fundi not visualized, oral mucosa unremarkable NECK:  No jugular venous distention, waveform within normal limits, carotid upstroke brisk and symmetric, no bruits, no thyromegaly LUNGS: Vented breath sounds.   HEART:  RRR.  PMI not displaced or sustained,S1 and S2 within normal limits, no S3, no S4, no clicks, no rubs, no murmurs ABD:  Flat, positive bowel sounds normal in frequency in pitch, no bruits, no rebound, no guarding, no midline pulsatile mass, no hepatomegaly, no splenomegaly EXT:  2 plus pulses throughout, bilateral UE/LE edema,  no cyanosis no clubbing SKIN:  No rashes no nodules NEURO: Opens eyes to voice. Moves all four extremities  PSYCH:  unable to assess  Labs    High Sensitivity Troponin:   Recent Labs  Lab 04/10/2021 1847  TROPONINIHS 10     Chemistry Recent Labs  Lab 05/10/2021 0419 05/24/2021 0546 05/08/21 0508 05/08/21 0926 05/09/21 0500 05/09/21 0823 05/10/21 0501 05/10/21 0603 05/10/21 1625 05/11/21 0523  NA 141   < > 139   < > 136   < > 137 136 133* 135  K 5.8*   < > 5.9*   < > 4.7   < > 4.4 4.4 4.7 3.1*  CL 101   < > 99   < > 99   < > 100  --  100 102  CO2 32   < > 29   < > 27   < > 28  --  25 25  GLUCOSE 151*   < > 107*   < > 145*   < > 195*  --  203* 123*  BUN 90*   < > 78*   < > 57*   < > 46*  --  40* 26*  CREATININE 3.10*   < > 2.85*   < > 2.17*   < > 1.72*  --  1.37* 1.17*  CALCIUM 7.9*   < > 7.3*   < > 7.4*   < > 7.5*  --  7.6* 7.7*  MG  --    < > 2.4  --  2.6*  --  2.3  --   --   --   PROT 5.0*  --   --   --   --   --   --   --   --   --   ALBUMIN 2.1*   < > 2.1*   < > 2.2*   < > 1.8*  --  2.1* 1.8*  AST 26  --   --   --   --   --   --   --   --   --   ALT 22  --   --   --   --   --   --   --   --   --   ALKPHOS 95  --   --   --   --   --   --   --   --   --   BILITOT 0.7  --   --   --   --   --   --   --   --   --   GFRNONAA 15*   < >  17*   < > 23*   < > 31*  --  41* 49*  ANIONGAP 8   < > 11   < > 10   < > 9  --  8 8   < > = values in this interval not displayed.    Lipids No results for input(s): CHOL, TRIG, HDL, LABVLDL, LDLCALC, CHOLHDL in the last 168 hours.  Hematology Recent Labs  Lab 05/09/21 0500 05/10/21 0501 05/10/21 0603 05/11/21 0523  WBC 18.5* 12.2*  --  13.7*  RBC 3.52* 2.94*  --  3.19*  HGB 10.4* 8.7* 9.9* 9.3*  HCT 35.5* 29.3* 29.0* 31.8*  MCV 100.9* 99.7  --  99.7  MCH 29.5 29.6  --  29.2  MCHC 29.3* 29.7*  --  29.2*  RDW 15.2 15.0  --  14.6  PLT 163 PLATELET CLUMPS NOTED ON SMEAR, UNABLE TO ESTIMATE  --  117*   Thyroid No results for input(s):  TSH, FREET4 in the last 168 hours.  BNP No results for input(s): BNP, PROBNP in the last 168 hours.   DDimer  No results for input(s): DDIMER in the last 168 hours.    Radiology    DG Chest Port 1 View  Result Date: 05/11/2021 CLINICAL DATA:  Pneumothorax. EXAM: PORTABLE CHEST 1 VIEW COMPARISON:  May 09, 2021. FINDINGS: Stable cardiomegaly. Endotracheal and feeding tubes are in good position. Bilateral internal jugular catheters are unchanged. Left-sided chest tube is unchanged. No pneumothorax is noted. Stable bilateral lung opacities are noted, right greater than left. Bony thorax is unremarkable. IMPRESSION: Stable support apparatus. Stable left-sided chest tube without pneumothorax. Stable bilateral lung opacities. Electronically Signed   By: Marijo Conception M.D.   On: 05/11/2021 07:59   DG CHEST PORT 1 VIEW  Result Date: 05/09/2021 CLINICAL DATA:  New dialysis catheter. EXAM: PORTABLE CHEST 1 VIEW COMPARISON:  May 08, 2021. FINDINGS: Stable cardiomegaly. Endotracheal and feeding tubes are in good position. Interval placement of right internal jugular catheter with distal tip in expected position of cavoatrial junction. Left internal jugular catheter is noted as well with distal tip in expected position of cavoatrial junction. No pneumothorax is noted. Right lung opacity is noted concerning for worsening edema or pneumonia. Pleural effusion may be present. Left-sided chest tube is again noted without definite pneumothorax. IMPRESSION: Bilateral internal jugular catheters are noted with distal tips in expected position of cavoatrial junction. No pneumothorax is noted. Right lung opacity is noted concerning for worsening pneumonia or edema. Electronically Signed   By: Marijo Conception M.D.   On: 05/09/2021 17:32   DG Abd Portable 1V  Result Date: 05/09/2021 CLINICAL DATA:  NG tube placement EXAM: PORTABLE ABDOMEN - 1 VIEW COMPARISON:  None. FINDINGS: Feeding tube with the tip projecting  over the antrum of the stomach. No bowel dilatation to suggest obstruction. No evidence of pneumoperitoneum, portal venous gas or pneumatosis. No pathologic calcifications along the expected course of the ureters. Bilateral small pleural effusions. Bilateral lower lung interstitial thickening and atelectasis. No acute osseous abnormality. IMPRESSION: Feeding tube with the tip projecting over the antrum of the stomach. Electronically Signed   By: Kathreen Devoid M.D.   On: 05/09/2021 15:14    Cardiac Studies   Echo 05/05/21: IMPRESSIONS    1. Left ventricular ejection fraction, by estimation, is 60 to 65%. The  left ventricle has normal function. The left ventricle has no regional  wall motion abnormalities. Left ventricular diastolic parameters are  indeterminate.  2. Right ventricular systolic function is normal. The right ventricular  size is normal. Tricuspid regurgitation signal is inadequate for assessing  PA pressure.   3. The mitral valve is normal in structure. No evidence of mitral valve  regurgitation. No evidence of mitral stenosis.   4. The aortic valve is tricuspid. Aortic valve regurgitation is not  visualized. Mild aortic valve sclerosis is present, with no evidence of  aortic valve stenosis.   5. The inferior vena cava is normal in size with greater than 50%  respiratory variability, suggesting right atrial pressure of 3 mmHg.   6. Large pericardial effusion. The pericardial effusion is  circumferential. The effusion measures 2.42cm at greatest diameter  posteriorly. There is RA inversion but no RV diastolic collapse and the  IVC is normal size and collapses > 50% with expiration.   There is normal MV inflow pattern with respirations. No findings of  impending tamponade but this is a clinical diagnosis made at the bedside.   Echo 05/20/2021: IMPRESSIONS   1. Large pericardial effusion (largest in posterior distribution); mild  RA collapse but no RV collapse; no respirtaory  flow variation; IVC dilated  but pt on ventilator; no obvious tamponade physiology; findings similar to  05/05/21.   2. Left ventricular ejection fraction, by estimation, is 55 to 60%. The  left ventricle has normal function. The left ventricle has no regional  wall motion abnormalities.   3. Right ventricular systolic function is normal. The right ventricular  size is normal.   4. Large pericardial effusion.   5. The mitral valve is normal in structure.   6. The aortic valve is tricuspid. Mild aortic valve sclerosis is present,  with no evidence of aortic valve stenosis.   Patient Profile     75 y.o. female with COPD, hypertension, hyperlipidemia, and GERD admitted with acute respiratory failure.  She subsequently developed bradycardia and asystolic arrest.  She was intubated and resuscitated for 15 minutes.  She had a seizure on 9/30 and transient atrial fibrillation.  Cardiology was consulted for pericardial effusion.  Assessment & Plan    # Pericardial effusion: Over the weekend and plans were made for pericardiocentesis.  She has a large pericardial effusion that is mostly posterior.  No evidence of tamponade. Unchanged on repeated echo x2.  On review by the Cath Lab team it was not thought to be favorable for pericardiocentesis.  She remains hypotensive (MAP 60s) but off pressors.  CVP 15.  Pericardial effusion likely vaccine related.  She has intermittently been prone.  The effusion may now be more anterior.  Though she is off pressors, her hemodynamic status is still tenuous.  We will get a repeat limited echo to ensure the effusion is not worsening an that there is no tamponade physiology.   #Paroxysmal atrial fibrillation: She has been in and out of atrial fibrillation.  Amiodarone stopped due to bradycardia. Given her recurrent atrial fibrillation, would recommend heparin when deemed stable by the primary team. Hgb has been fluctuating.   #AKI: Now on CVVHD and tolerating fluid  removal.   # Bradycardia:  She was initially bradycardia with attempts at fluid removal by CVVHD.  In the last 24H Isuprel and epi were stopped due to recurrent atrial fibrillation.    Total critical care time: 40 minutes. Critical care time was exclusive of separately billable procedures and treating other patients. Critical care was necessary to treat or prevent imminent or life-threatening deterioration. Critical care was time spent personally by me  on the following activities: development of treatment plan with patient and/or surrogate as well as nursing, discussions with consultants, evaluation of patient's response to treatment, examination of patient, obtaining history from patient or surrogate, ordering and performing treatments and interventions, ordering and review of laboratory studies, ordering and review of radiographic studies, pulse oximetry and re-evaluation of patient's condition.   We will follow peripherally over the weekend. Please call if there are questions.    For questions or updates, please contact [place] Please consult [email or web address] for contact info under        Signed, Skeet Latch, MD  05/11/2021, 8:54 AM

## 2021-05-12 DIAGNOSIS — I3139 Other pericardial effusion (noninflammatory): Secondary | ICD-10-CM | POA: Diagnosis not present

## 2021-05-12 DIAGNOSIS — J9621 Acute and chronic respiratory failure with hypoxia: Secondary | ICD-10-CM | POA: Diagnosis not present

## 2021-05-12 DIAGNOSIS — I480 Paroxysmal atrial fibrillation: Secondary | ICD-10-CM | POA: Diagnosis not present

## 2021-05-12 LAB — GLUCOSE, CAPILLARY
Glucose-Capillary: 110 mg/dL — ABNORMAL HIGH (ref 70–99)
Glucose-Capillary: 118 mg/dL — ABNORMAL HIGH (ref 70–99)
Glucose-Capillary: 129 mg/dL — ABNORMAL HIGH (ref 70–99)
Glucose-Capillary: 137 mg/dL — ABNORMAL HIGH (ref 70–99)
Glucose-Capillary: 155 mg/dL — ABNORMAL HIGH (ref 70–99)
Glucose-Capillary: 166 mg/dL — ABNORMAL HIGH (ref 70–99)
Glucose-Capillary: 193 mg/dL — ABNORMAL HIGH (ref 70–99)

## 2021-05-12 LAB — CBC
HCT: 33.2 % — ABNORMAL LOW (ref 36.0–46.0)
Hemoglobin: 9.4 g/dL — ABNORMAL LOW (ref 12.0–15.0)
MCH: 28.7 pg (ref 26.0–34.0)
MCHC: 28.3 g/dL — ABNORMAL LOW (ref 30.0–36.0)
MCV: 101.5 fL — ABNORMAL HIGH (ref 80.0–100.0)
Platelets: 165 10*3/uL (ref 150–400)
RBC: 3.27 MIL/uL — ABNORMAL LOW (ref 3.87–5.11)
RDW: 14.9 % (ref 11.5–15.5)
WBC: 16.9 10*3/uL — ABNORMAL HIGH (ref 4.0–10.5)
nRBC: 0.5 % — ABNORMAL HIGH (ref 0.0–0.2)

## 2021-05-12 LAB — RENAL FUNCTION PANEL
Albumin: 2 g/dL — ABNORMAL LOW (ref 3.5–5.0)
Albumin: 2 g/dL — ABNORMAL LOW (ref 3.5–5.0)
Anion gap: 7 (ref 5–15)
Anion gap: 7 (ref 5–15)
BUN: 23 mg/dL (ref 8–23)
BUN: 24 mg/dL — ABNORMAL HIGH (ref 8–23)
CO2: 27 mmol/L (ref 22–32)
CO2: 27 mmol/L (ref 22–32)
Calcium: 8 mg/dL — ABNORMAL LOW (ref 8.9–10.3)
Calcium: 8.1 mg/dL — ABNORMAL LOW (ref 8.9–10.3)
Chloride: 101 mmol/L (ref 98–111)
Chloride: 101 mmol/L (ref 98–111)
Creatinine, Ser: 1.06 mg/dL — ABNORMAL HIGH (ref 0.44–1.00)
Creatinine, Ser: 0.99 mg/dL (ref 0.44–1.00)
GFR, Estimated: 55 mL/min — ABNORMAL LOW (ref >60)
GFR, Estimated: >60 mL/min (ref >60)
Glucose, Bld: 128 mg/dL — ABNORMAL HIGH (ref 70–99)
Glucose, Bld: 165 mg/dL — ABNORMAL HIGH (ref 70–99)
Phosphorus: 1.8 mg/dL — ABNORMAL LOW (ref 2.5–4.6)
Phosphorus: 2 mg/dL — ABNORMAL LOW (ref 2.5–4.6)
Potassium: 4.2 mmol/L (ref 3.5–5.1)
Potassium: 4.8 mmol/L (ref 3.5–5.1)
Sodium: 135 mmol/L (ref 135–145)
Sodium: 135 mmol/L (ref 135–145)

## 2021-05-12 LAB — MAGNESIUM: Magnesium: 2.6 mg/dL — ABNORMAL HIGH (ref 1.7–2.4)

## 2021-05-12 NOTE — Progress Notes (Signed)
Progress Note  Patient Name: Lisa Hamilton Date of Encounter: 05/12/2021  [facility] Cardiologist: None   Subjective   Intubated, sedated, not responsive.  Inpatient Medications    Scheduled Meds:  arformoterol  15 mcg Nebulization BID   B-complex with vitamin C  1 tablet Per Tube Daily   budesonide (PULMICORT) nebulizer solution  0.5 mg Nebulization BID   chlorhexidine gluconate (MEDLINE KIT)  15 mL Mouth Rinse BID   Chlorhexidine Gluconate Cloth  6 each Topical Daily   clonazePAM  1 mg Per Tube QHS   feeding supplement (PROSource TF)  45 mL Per Tube BID   gabapentin  100 mg Per Tube Q8H   heparin injection (subcutaneous)  5,000 Units Subcutaneous Q8H   insulin aspart  0-20 Units Subcutaneous Q4H   lactulose  20 g Per Tube TID   levETIRAcetam  500 mg Per Tube BID   mouth rinse  15 mL Mouth Rinse 10 times per day   montelukast  10 mg Per Tube QPM   pantoprazole sodium  40 mg Per Tube Daily   polyethylene glycol  17 g Per Tube BID   pravastatin  40 mg Per Tube Daily   revefenacin  175 mcg Nebulization Daily   senna-docusate  2 tablet Per Tube BID   sodium chloride flush  10-40 mL Intracatheter Q12H   sodium chloride flush  3 mL Intravenous Q12H   Continuous Infusions:   prismasol BGK 4/2.5 300 mL/hr at 05/12/21 0332    prismasol BGK 4/2.5 300 mL/hr at 05/12/21 1829   sodium chloride 10 mL/hr at 05/12/21 1000   epinephrine Stopped (05/10/21 2138)   feeding supplement (VITAL 1.5 CAL) 1,000 mL (05/11/21 1033)   fentaNYL infusion INTRAVENOUS 150 mcg/hr (05/12/21 1000)   heparin 10,000 units/ 20 mL infusion syringe 1,000 Units/hr (05/12/21 1000)   isoproterenol (ISUPREL) infusion Stopped (05/10/21 2104)   prismasol BGK 4/2.5 1,500 mL/hr at 05/12/21 1018   PRN Meds: acetaminophen **OR** acetaminophen, albuterol, atropine, bisacodyl, fentaNYL (SUBLIMAZE) injection, fentaNYL (SUBLIMAZE) injection, heparin, heparin sodium (porcine), hydrALAZINE, midazolam, [DISCONTINUED]  ondansetron **OR** ondansetron (ZOFRAN) IV, sodium chloride, sodium chloride flush   Vital Signs    Vitals:   05/12/21 0752 05/12/21 0800 05/12/21 0900 05/12/21 1000  BP:  (!) 109/52 (!) 103/56 132/65  Pulse:  72 74 76  Resp:  (!) 25 20 (!) 26  Temp: 98.1 F (36.7 C) (!) 97.3 F (36.3 C) (!) 97 F (36.1 C) (!) 97 F (36.1 C)  TempSrc: Axillary     SpO2:  94% 93% 92%  Weight:      Height:        Intake/Output Summary (Last 24 hours) at 05/12/2021 1037 Last data filed at 05/12/2021 1000 Gross per 24 hour  Intake 2605.11 ml  Output 4818 ml  Net -2212.89 ml   Last 3 Weights 05/12/2021 05/09/2021 05/27/2021  Weight (lbs) 241 lb 6.5 oz 243 lb 9.7 oz 242 lb 15.2 oz  Weight (kg) 109.5 kg 110.5 kg 110.2 kg      Telemetry    Sinus rhythm- Personally Reviewed has been no atrial fibrillation since  October 06 at around 0600 hrs.  ECG    Tracing 05/06/2021 with sinus bradycardia and low voltage throughout- Personally Reviewed  Physical Exam  Intubated, sedated GEN: No acute distress.   Neck: Unable to evaluate for JVD.   Cardiac: RRR, rather distant heart sounds, no murmurs, rubs, or gallops. There is no pulses paradoxus Respiratory: Clear to auscultation bilaterally.  GI: Soft, nontender, non-distended  MS: No edema; No deformity. Neuro:  Nonfocal  Psych: Normal affect   Labs    High Sensitivity Troponin:   Recent Labs  Lab 04/24/2021 1847  TROPONINIHS 10     Chemistry Recent Labs  Lab 05/12/2021 0419 06/02/2021 0546 05/10/21 0501 05/10/21 0603 05/11/21 0523 05/11/21 1550 05/12/21 0500  NA 141   < > 137   < > 135 134* 135  K 5.8*   < > 4.4   < > 3.1* 4.0 4.2  CL 101   < > 100   < > 102 102 101  CO2 32   < > 28   < > $R'25 27 27  'fi$ GLUCOSE 151*   < > 195*   < > 123* 181* 128*  BUN 90*   < > 46*   < > 26* 25* 23  CREATININE 3.10*   < > 1.72*   < > 1.17* 1.11* 1.06*  CALCIUM 7.9*   < > 7.5*   < > 7.7* 7.7* 8.0*  MG  --    < > 2.3  --  2.5*  --  2.6*  PROT 5.0*  --   --    --   --   --   --   ALBUMIN 2.1*   < > 1.8*   < > 1.8* 2.1* 2.0*  AST 26  --   --   --   --   --   --   ALT 22  --   --   --   --   --   --   ALKPHOS 95  --   --   --   --   --   --   BILITOT 0.7  --   --   --   --   --   --   GFRNONAA 15*   < > 31*   < > 49* 52* 55*  ANIONGAP 8   < > 9   < > $R'8 5 7   'IH$ < > = values in this interval not displayed.    Lipids No results for input(s): CHOL, TRIG, HDL, LABVLDL, LDLCALC, CHOLHDL in the last 168 hours.  Hematology Recent Labs  Lab 05/10/21 0501 05/10/21 0603 05/11/21 0523 05/12/21 0500  WBC 12.2*  --  13.7* 16.9*  RBC 2.94*  --  3.19* 3.27*  HGB 8.7* 9.9* 9.3* 9.4*  HCT 29.3* 29.0* 31.8* 33.2*  MCV 99.7  --  99.7 101.5*  MCH 29.6  --  29.2 28.7  MCHC 29.7*  --  29.2* 28.3*  RDW 15.0  --  14.6 14.9  PLT PLATELET CLUMPS NOTED ON SMEAR, UNABLE TO ESTIMATE  --  117* 165   Thyroid No results for input(s): TSH, FREET4 in the last 168 hours.  BNPNo results for input(s): BNP, PROBNP in the last 168 hours.  DDimer No results for input(s): DDIMER in the last 168 hours.   Radiology    DG Chest Port 1 View  Result Date: 05/11/2021 CLINICAL DATA:  Pneumothorax. EXAM: PORTABLE CHEST 1 VIEW COMPARISON:  May 09, 2021. FINDINGS: Stable cardiomegaly. Endotracheal and feeding tubes are in good position. Bilateral internal jugular catheters are unchanged. Left-sided chest tube is unchanged. No pneumothorax is noted. Stable bilateral lung opacities are noted, right greater than left. Bony thorax is unremarkable. IMPRESSION: Stable support apparatus. Stable left-sided chest tube without pneumothorax. Stable bilateral lung opacities. Electronically Signed   By: Bobbe Medico.D.  On: 05/11/2021 07:59   DG Abd Portable 1V  Result Date: 05/11/2021 CLINICAL DATA:  No bowel movement for 8 days until last night. Two subsequent bouts of diarrhea. History of small-bowel obstruction. EXAM: PORTABLE ABDOMEN - 1 VIEW COMPARISON:  Radiographs 05/04/2021 and  10/15/2017.  CT 09/07/2019. FINDINGS: 1057 hours. Two views obtained. Feeding tube tip projects over the right mid abdomen, likely in the distal stomach or proximal small bowel. There is new at least moderate diffuse bowel distension, especially in the right lower quadrant, which appears to be predominantly colonic. There may be mild associated bowel wall thickening. No evidence of pneumatosis or free air on supine imaging. Cardiomegaly, patchy opacities at both lung bases and small bilateral pleural effusions are noted. There are degenerative changes throughout the spine associated with a thoracolumbar scoliosis. IMPRESSION: Diffuse bowel distension, predominately colonic, likely reflecting an ileus. If concern of bowel obstruction, recommend radiographic follow-up with erect views or CT for further assessment. Electronically Signed   By: Richardean Sale M.D.   On: 05/11/2021 14:51   ECHOCARDIOGRAM LIMITED  Result Date: 05/11/2021    ECHOCARDIOGRAM LIMITED REPORT   Patient Name:   Lisa Hamilton Date of Exam: 05/11/2021 Medical Rec #:  [id number]    Height:       60.0 in Accession #:    [id number]   Weight:       243.6 lb Date of Birth:  1945-12-11   BSA:          2.030 m Patient Age:    29 years     BP:           120/59 mmHg Patient Gender: F            HR:           75 bpm. Exam Location:  Inpatient Procedure: Limited Echo, Limited Color Doppler and Cardiac Doppler Indications:    Pericardial effusion I31.3  History:        Patient has prior history of Echocardiogram examinations, most                 recent 05/08/2021. COPD; Risk Factors:Dyslipidemia and                 Hypertension.  Sonographer:    Bernadene Person RDCS Referring Phys: [id number] [facility] [place]  Sonographer Comments: Echo performed with patient supine and on artificial respirator. IMPRESSIONS  1. Left ventricular ejection fraction, by estimation, is 60 to 65%. The left ventricle has normal function. The left ventricle has no regional wall motion  abnormalities.  2. Right ventricular systolic function is normal. The right ventricular size is normal.  3. When compared to prior ECHO from 05/08/21, there is no significant change in the size of pericardial effusion. Remains large. Pericardial effusion 2.1 cm posteriorly. However, there does not appear to be RA diastolic collapse as was seen three days prior. Large pericardial effusion. The pericardial effusion is circumferential.  4. The mitral valve is grossly normal.  5. The aortic valve is grossly normal.  6. The inferior vena cava is dilated in size with <50% respiratory variability, suggesting right atrial pressure of 15 mmHg. FINDINGS  Left Ventricle: Left ventricular ejection fraction, by estimation, is 60 to 65%. The left ventricle has normal function. The left ventricle has no regional wall motion abnormalities. The left ventricular internal cavity size was normal in size. There is  no left ventricular hypertrophy. Right Ventricle: The right ventricular size is normal. No increase in right  ventricular wall thickness. Right ventricular systolic function is normal. Left Atrium: Left atrial size was normal in size. Right Atrium: Right atrial size was normal in size. Pericardium: When compared to prior ECHO from 05/08/21, there is no significant change in the size of pericardial effusion. Remains large. Pericardial effusion 2.1 cm posteriorly. However, there does not appear to be RA diastolic collapse as was seen three days prior. A large pericardial effusion is present. The pericardial effusion is circumferential. Mitral Valve: The mitral valve is grossly normal. Tricuspid Valve: The tricuspid valve is normal in structure. Tricuspid valve regurgitation is not demonstrated. No evidence of tricuspid stenosis. Aortic Valve: The aortic valve is grossly normal. Aorta: The aortic root is normal in size and structure. Venous: The inferior vena cava is dilated in size with less than 50% respiratory variability,  suggesting right atrial pressure of 15 mmHg. LEFT VENTRICLE PLAX 2D LVIDd:         4.60 cm LVIDs:         3.10 cm LV PW:         1.20 cm LV IVS:        1.10 cm  LEFT ATRIUM         Index LA diam:    2.80 cm 1.38 cm/m Candee Furbish MD Electronically signed by Candee Furbish MD Signature Date/Time: 05/11/2021/2:02:50 PM    Final     Cardiac Studies   Echo 05/11/2021 reviewed.  Normal right and left ventricular systolic function and a persistent large pericardial effusion.  The only change from the previous images is that the inferior vena cava is now described as dilated (which is a nonspecific finding during positive pressure ventilation).  There is no evidence of respiratory flow variation across the mitral and tricuspid valves and there is no evidence of diastolic chamber collapse.  Patient Profile     75 y.o. female with severe COPD now with acute on chronic hypoxic/hypercapnic respiratory failure and possible aspiration pneumonia, transient shock, large pericardial effusion without tamponade, intermittent paroxysmal atrial fibrillation, oligoanuric acute kidney failure on CRRT, ileus  Assessment & Plan    Not requiring pressors. Repeat echocardiogram performed 05/11/2021 shows findings similar to previous echo from 10/04: Large effusion without echo signs of tamponade.  Attempted pericardiocentesis was aborted since the effusion is largely posteriorly located. Last episode of paroxysmal atrial fibrillation was at 0600 hrs. on 05/10/2021 and was brief. Family is considering a conservative approach to the patient's care, they appeared disinclined to pursue tracheostomy and other invasive procedures.  She is now DNR.     For questions or updates, please contact [place] Please consult [email or web address] for contact info under        Signed, Sanda Klein, MD  05/12/2021, 10:37 AM

## 2021-05-12 NOTE — Progress Notes (Signed)
[facility] Progress Note   Subjective:   I/Os yesterday 2.6 / 4.8 stil net + 5.4 for admission. Remains intubated/sedated on 60% O2 Husband bedside. Now anuric.  Objective Vitals:   05/12/21 0800 05/12/21 0900 05/12/21 1000 05/12/21 1100  BP: (!) 109/52 (!) 103/56 132/65 131/62  Pulse: 72 74 76 82  Resp: (!) 25 20 (!) 26 20  Temp: (!) 97.3 F (36.3 C) (!) 97 F (36.1 C) (!) 97 F (36.1 C) (!) 96.8 F (36 C)  TempSrc:      SpO2: 94% 93% 92% (!) 86%  Weight:      Height:       Physical Exam General: intubated, sedated Heart: NSR on monitor Lungs: coarse, FiO2 60%, Extremities: 1+ diffuse edema improving Dialysis Access:  RIJ temp HD cath  Additional Objective Labs: Basic Metabolic Panel: Recent Labs  Lab 05/11/21 0523 05/11/21 1550 05/12/21 0500  NA 135 134* 135  K 3.1* 4.0 4.2  CL 102 102 101  CO2 25 27 27   GLUCOSE 123* 181* 128*  BUN 26* 25* 23  CREATININE 1.17* 1.11* 1.06*  CALCIUM 7.7* 7.7* 8.0*  PHOS 2.3* 2.4* 1.8*    Liver Function Tests: Recent Labs  Lab 05/29/2021 0419 05/14/2021 1600 05/11/21 0523 05/11/21 1550 05/12/21 0500  AST 26  --   --   --   --   ALT 22  --   --   --   --   ALKPHOS 95  --   --   --   --   BILITOT 0.7  --   --   --   --   PROT 5.0*  --   --   --   --   ALBUMIN 2.1*   < > 1.8* 2.1* 2.0*   < > = values in this interval not displayed.    No results for input(s): LIPASE, AMYLASE in the last 168 hours. CBC: Recent Labs  Lab 05/08/21 0508 05/09/21 0005 05/09/21 0500 05/10/21 0501 05/10/21 0603 05/11/21 0523 05/12/21 0500  WBC 18.8*  --  18.5* 12.2*  --  13.7* 16.9*  HGB 10.2*   < > 10.4* 8.7* 9.9* 9.3* 9.4*  HCT 35.1*   < > 35.5* 29.3* 29.0* 31.8* 33.2*  MCV 100.3*  --  100.9* 99.7  --  99.7 101.5*  PLT 204  --  163 PLATELET CLUMPS NOTED ON SMEAR, UNABLE TO ESTIMATE  --  117* 165   < > = values in this interval not displayed.    Blood Culture    Component Value Date/Time   SDES BRONCHIAL  ALVEOLAR LAVAGE 05/18/2021 1721   SPECREQUEST NONE 05/20/2021 1721   CULT  05/21/2021 1721    Normal respiratory flora-no Staph aureus or Pseudomonas seen Performed at [facility], [address], [place], [place]    REPTSTATUS 05/09/2021 FINAL 05/24/2021 1721    Cardiac Enzymes: No results for input(s): CKTOTAL, CKMB, CKMBINDEX, TROPONINI in the last 168 hours. CBG: Recent Labs  Lab 05/11/21 1950 05/12/21 0034 05/12/21 0403 05/12/21 0734 05/12/21 1118  GLUCAP 143* 118* 110* 137* 166*    Iron Studies: No results for input(s): IRON, TIBC, TRANSFERRIN, FERRITIN in the last 72 hours. @lablastinr3 @ Studies/Results: DG Chest Port 1 View  Result Date: 05/11/2021 CLINICAL DATA:  Pneumothorax. EXAM: PORTABLE CHEST 1 VIEW COMPARISON:  May 09, 2021. FINDINGS: Stable cardiomegaly. Endotracheal and feeding tubes are in good position. Bilateral internal jugular catheters are unchanged. Left-sided chest tube is unchanged.  No pneumothorax is noted. Stable bilateral lung opacities are noted, right greater than left. Bony thorax is unremarkable. IMPRESSION: Stable support apparatus. Stable left-sided chest tube without pneumothorax. Stable bilateral lung opacities. Electronically Signed   By: Marijo Conception M.D.   On: 05/11/2021 07:59   DG Abd Portable 1V  Result Date: 05/11/2021 CLINICAL DATA:  No bowel movement for 8 days until last night. Two subsequent bouts of diarrhea. History of small-bowel obstruction. EXAM: PORTABLE ABDOMEN - 1 VIEW COMPARISON:  Radiographs 05/04/2021 and 10/15/2017.  CT 09/07/2019. FINDINGS: 1057 hours. Two views obtained. Feeding tube tip projects over the right mid abdomen, likely in the distal stomach or proximal small bowel. There is new at least moderate diffuse bowel distension, especially in the right lower quadrant, which appears to be predominantly colonic. There may be mild associated bowel wall thickening. No evidence of pneumatosis or free air  on supine imaging. Cardiomegaly, patchy opacities at both lung bases and small bilateral pleural effusions are noted. There are degenerative changes throughout the spine associated with a thoracolumbar scoliosis. IMPRESSION: Diffuse bowel distension, predominately colonic, likely reflecting an ileus. If concern of bowel obstruction, recommend radiographic follow-up with erect views or CT for further assessment. Electronically Signed   By: Richardean Sale M.D.   On: 05/11/2021 14:51   ECHOCARDIOGRAM LIMITED  Result Date: 05/11/2021    ECHOCARDIOGRAM LIMITED REPORT   Patient Name:   LEILANIE RAUDA Date of Exam: 05/11/2021 Medical Rec #:  [id number]    Height:       60.0 in Accession #:    [id number]   Weight:       243.6 lb Date of Birth:  03-May-1946   BSA:          2.030 m Patient Age:    8 years     BP:           120/59 mmHg Patient Gender: F            HR:           75 bpm. Exam Location:  Inpatient Procedure: Limited Echo, Limited Color Doppler and Cardiac Doppler Indications:    Pericardial effusion I31.3  History:        Patient has prior history of Echocardiogram examinations, most                 recent 05/08/2021. COPD; Risk Factors:Dyslipidemia and                 Hypertension.  Sonographer:    Bernadene Person RDCS Referring Phys: [id number] [facility] [place]  Sonographer Comments: Echo performed with patient supine and on artificial respirator. IMPRESSIONS  1. Left ventricular ejection fraction, by estimation, is 60 to 65%. The left ventricle has normal function. The left ventricle has no regional wall motion abnormalities.  2. Right ventricular systolic function is normal. The right ventricular size is normal.  3. When compared to prior ECHO from 05/08/21, there is no significant change in the size of pericardial effusion. Remains large. Pericardial effusion 2.1 cm posteriorly. However, there does not appear to be RA diastolic collapse as was seen three days prior. Large pericardial effusion. The pericardial  effusion is circumferential.  4. The mitral valve is grossly normal.  5. The aortic valve is grossly normal.  6. The inferior vena cava is dilated in size with <50% respiratory variability, suggesting right atrial pressure of 15 mmHg. FINDINGS  Left Ventricle: Left ventricular ejection fraction, by estimation, is 60 to 65%.  The left ventricle has normal function. The left ventricle has no regional wall motion abnormalities. The left ventricular internal cavity size was normal in size. There is  no left ventricular hypertrophy. Right Ventricle: The right ventricular size is normal. No increase in right ventricular wall thickness. Right ventricular systolic function is normal. Left Atrium: Left atrial size was normal in size. Right Atrium: Right atrial size was normal in size. Pericardium: When compared to prior ECHO from 05/08/21, there is no significant change in the size of pericardial effusion. Remains large. Pericardial effusion 2.1 cm posteriorly. However, there does not appear to be RA diastolic collapse as was seen three days prior. A large pericardial effusion is present. The pericardial effusion is circumferential. Mitral Valve: The mitral valve is grossly normal. Tricuspid Valve: The tricuspid valve is normal in structure. Tricuspid valve regurgitation is not demonstrated. No evidence of tricuspid stenosis. Aortic Valve: The aortic valve is grossly normal. Aorta: The aortic root is normal in size and structure. Venous: The inferior vena cava is dilated in size with less than 50% respiratory variability, suggesting right atrial pressure of 15 mmHg. LEFT VENTRICLE PLAX 2D LVIDd:         4.60 cm LVIDs:         3.10 cm LV PW:         1.20 cm LV IVS:        1.10 cm  LEFT ATRIUM         Index LA diam:    2.80 cm 1.38 cm/m Candee Furbish MD Electronically signed by Candee Furbish MD Signature Date/Time: 05/11/2021/2:02:50 PM    Final    Medications:   prismasol BGK 4/2.5 300 mL/hr at 05/12/21 0332    prismasol BGK  4/2.5 300 mL/hr at 05/12/21 0947   sodium chloride 10 mL/hr at 05/12/21 1100   feeding supplement (VITAL 1.5 CAL) 1,000 mL (05/11/21 1033)   fentaNYL infusion INTRAVENOUS 150 mcg/hr (05/12/21 1100)   heparin 10,000 units/ 20 mL infusion syringe 1,000 Units/hr (05/12/21 1000)   prismasol BGK 4/2.5 1,500 mL/hr at 05/12/21 1018    arformoterol  15 mcg Nebulization BID   B-complex with vitamin C  1 tablet Per Tube Daily   budesonide (PULMICORT) nebulizer solution  0.5 mg Nebulization BID   chlorhexidine gluconate (MEDLINE KIT)  15 mL Mouth Rinse BID   Chlorhexidine Gluconate Cloth  6 each Topical Daily   clonazePAM  1 mg Per Tube QHS   feeding supplement (PROSource TF)  45 mL Per Tube BID   gabapentin  100 mg Per Tube Q8H   heparin injection (subcutaneous)  5,000 Units Subcutaneous Q8H   insulin aspart  0-20 Units Subcutaneous Q4H   lactulose  20 g Per Tube TID   levETIRAcetam  500 mg Per Tube BID   mouth rinse  15 mL Mouth Rinse 10 times per day   montelukast  10 mg Per Tube QPM   pantoprazole sodium  40 mg Per Tube Daily   polyethylene glycol  17 g Per Tube BID   pravastatin  40 mg Per Tube Daily   revefenacin  175 mcg Nebulization Daily   senna-docusate  2 tablet Per Tube BID   sodium chloride flush  10-40 mL Intracatheter Q12H   sodium chloride flush  3 mL Intravenous Q12H    Assessment/Plan **AHRF, multifocal PNA: on significant vent support per primary. Steroids, empiric abx. Volume management with CRRT.  Eval for DAH/alveolitis per primary - doesn't look like vasculitis based on serologies.   **  AKI, severe, oliguric:  Multifactorial with contributions of contrast, hypotension > ATN.  Developed hyperkalemia, concern for AMS from uremia and was started on CRRT 10/3.   Renal US 10/3 with BL nonobstructing stones.  UA with hematuria - foley sample.  With DAH noted c/f vasculitis but her presenting creatinine was normal and her course of AKI is more consistent with hemodynamic insults  and contrast nephropathy than a GN; I do not think a kidney biopsy is indicated nor is she stable enough for such.  --Cont CRRT today but if remains hemodynamically stable would transition to iHD in coming days,  --cont current net 50-100 neg/hr; made good progress yesterday --Avoid nephrotoxins and hypotension as able   **Hyperkalemia: K 4.2 maintain 4K dialyate   **A fib: on amiodarone; in NSR currently.  Cardiology following.    **Pericardial effusion:  no tamponade and smaller on exam 10/3.  The effusion preceeded AKI/significant BUN elevation so not consistent with uremic etiology.  Thought to be COVID vaccine related.  Remains critically ill with long road ahead likely trach, PEG and at this time remains dialysis dependent which really limits disposition options and QoL.  Plan is to cont current care over weekend and then primary to reconvene with family Monday to discuss long term plans based on current status at that time.   Jannifer Hick MD 05/12/2021, 11:28 AM  [facility] Pager: [phone]

## 2021-05-12 NOTE — Progress Notes (Signed)
NAME:  Lisa Hamilton, MRN:  [id number], DOB:  03-22-1946, LOS: 9 ADMISSION DATE:  04/15/2021, CONSULTATION DATE:  9/29 REFERRING MD:  Dr. Lorin Hamilton, CHIEF COMPLAINT:  Cardiac arrest   History of Present Illness:  75 yr old female followed by Dr. Keturah Hamilton for COPD from asthma and chronic respiratory failure on 2 to 3 liters oxygen developed progressive cough, dyspnea, wheezing, and weakness.  Her husband reports she received COVID vaccine booster 2 days prior to developing symptoms.  She presented to ER on 9/28 and started on Bipap for acute on chronic hypoxic/hypercapnic respiratory failure.  She was started on steroids and antibiotics.  Had initial improvement and transitioned off Bipap per patient request.  Several hours later she then developed bradycardia leading to asystolic cardiac arrest.  Had ROSC after about 15 minutes.  Intubated and transferred to ICU.  Pertinent  Medical History  Anemia, Diverticulosis, DJD, GERD, Glaucoma, Hepatic cyst, Hiatal hernia, Colon polyps, HLD, HTN, Medullary sponge kidney, Goiter, Osteoporosis, SBO, Nephrolithiasis, RLS, Shingles, Vit D deficiency, prediabetes,  Significant Hospital Events: Including procedures, antibiotic start and stop dates in addition to other pertinent events   9/28 Admit 9/29 respiratory leading to cardiac arrest, intubated and transferred to ICU 9/30 tonic-clonic seizure and started on keppra, developed transient A fib and started on amiodarone, started on peripheral phenylephrine, Lt IJ CVL placed 10/1 back in sinus rhythm, off pressors, remains on increased PEEP/FiO2 10/3 - HD cath placed, started CRRT 10/04 - Chest tube placed for dependent consolidation.  10/05 - HD line clotted, replaced HD line in Rt IJ 10/06 - recurrent afib with rvr and titrating down Iso 10/07 titrated off pressors, minimally improved PEEP/FiO2 sats.  Interim History / Subjective:  Overnight events: [name] during family discussion yesterday. Did  have a BM overnight, unknown stool type.   Patient resting comfortably in bed with ETT in place. She denies any pain.   Objective   Blood pressure (!) 141/63, pulse 77, temperature 97.7 F (36.5 C), resp. rate (!) 26, height 5' (1.524 m), weight 109.5 kg, SpO2 92 %. CVP:  [6 mmHg-15 mmHg] 6 mmHg  Vent Mode: PRVC FiO2 (%):  [50 %-70 %] 60 % Set Rate:  [25 bmp] 25 bmp Vt Set:  [370 mL] 370 mL PEEP:  [10 cmH20] 10 cmH20 Plateau Pressure:  [25 cmH20-31 cmH20] 31 cmH20   Intake/Output Summary (Last 24 hours) at 05/12/2021 0631 Last data filed at 05/12/2021 0600 Gross per 24 hour  Intake 2782.91 ml  Output 4942 ml  Net -2159.09 ml   Filed Weights   05/08/2021 0500 05/09/21 0500 05/12/21 0500  Weight: 110.2 kg 110.5 kg 109.5 kg    Examination: General -ill-appearing patient, alert but intubated with a RASS of 0 to -1 Eyes -extraocular motion intact, PERRLA ENT - ETT in place, no secretion when suctioned.  Cardiac -regular rate and rhythm no murmurs rubs or gallops Chest - scattered wheezing Abdomen - soft, nontender Extremities - trace tp 1+ edema in lower extremities, warm and dry, 2+ distal pulses. Skin - no rashes Neuro - RASS 0 to -1  Epi @ 0.0 Iso @ 0.0 NE @ 0.0  Fentanyl 250 mcg/hr  Patient Lines/Drains/Airways Status     Active Line/Drains/Airways     Name Placement date Placement time Site Days   Peripheral IV 05/03/21 20 G Right Antecubital 05/03/21  1710  Antecubital  3   Peripheral IV 05/04/21 20 G 2.5" Anterior;Left;Proximal;Upper Arm 05/04/21  0415  Arm  2   CVC Triple Lumen 05/04/21 Left Internal jugular 05/04/21  1800  -- 2   NG/OG Vented/Dual Lumen Oral 05/03/21  1900  Oral  3   Urethral Catheter  Temperature probe 05/04/21  0230  Temperature probe  2   Airway 7.5 mm 05/03/21  2310  -- 3   Incision (Closed) 10/15/17 Abdomen 10/15/17  1028  -- 1299   Incision (Closed) 12/04/17 Abdomen 12/04/17  1241  -- 1249   Incision - 5 Ports Abdomen 1: Umbilicus 2:  Right 3: Left;Upper 4: Left;Mid 5: Left;Lower 12/04/17  1450  -- 1249   Wound / Incision (Open or Dehisced) 12/10/17 Incision - Open Abdomen Lower;Medial;Right JP drain site 12/10/17  2040  Abdomen  1243                  Resolved Hospital Problem list   Cardiogenic shock  Assessment & Plan:  75 yr old female with a pPMH of COPD (3L baseline), anemia, HLD, HTN, and prediabetes who presents with respiratory distress and admitted for acute hypoxic respiratory failure complicated by aspiration pneumonia with pulmonary arrests requiring intubation and mechanical ventilation and acute kidney failure requiring CRRT.  Acute on chronic hypoxic/hypercapnic respiratory failure, Multifactorial Pulmonary edema: Possible Aspiration pneumonia Alveolar hemorrhage/Possible alveolitis: Respiratory Arrest: [name]. PEEP of 10 and FiO2 of 70%.  - adjust PEEP/FiO2 to keep SpO2 > 92% - Finished course of Cefepime  - Will continue systemic steroids, end date 10/8 - Cont yupelri, brovana and continue pulmicort, singulair - prn albuterol  Pericardial Effusion 2/2 COVID vaccine rxn Repeat echo performed yesterday showing stable large pericardial effusion. -Continue CRRT per nephrology  Intermittent Afib: Hypotension: Normal Bps off pressors and tele shows normal rate and rhythm  -Hold home Cozaar -Monitor off of pressors -Continue telemetry monitor for intermittent atrial fibrillation -Consider adding full anticoagulation if she has another episode of atrial fibrillation  Oliguric AKI 2/2 ATN: Patient had 4.6 liters out with a net of -2.0 liters. She had 0 cc of urine OP recorded. Difficult to determine if she will have any improvement of her kidney function at this time.  - Appreciate nephrologies assistance.  - Monitor urine output and kidney function daily - Will aim for net negative fluid removal.   Constipation: History of small bowel  obstruction: Patient had a few small episodes of watery diarrhea yesterday without a full BM. KUB performed showing distended bowel without evidence of a cutoff point or perforation. She likely has an ileus. Will monitor closely for possible obstruction and need for CT scan.    - Started lactulose TID, titrate as needed - Consider CT in near future.   Acute metabolic encephalopathy from hypoxia, hypercapnia. Isolated seizure after cardiac arrest on 9/30. - RASS goal 0 to -1 - CT head normal - f/u EEG - continue keppra for now - continue klonopin, neurontin - consider holding requip due to bradycardia  Steroid induced hyperglycemia. - SSI - Goal of 120 o 180  Hx of osteoporosis. - hold outpt fosamax  Best Practice (right click and "Reselect all SmartList Selections" daily)  Diet/type: tubefeeds DVT prophylaxis: prophylactic heparin  GI prophylaxis: PPI Lines: N/A 1.5 L removed yesterday.  She seems to be tolerating this fairly well.:  Yes, and it is still needed Code Status:  full code Family: Updated pt's husband at bedside on 05/04/21.  Labs    CMP Latest Ref Rng & Units 05/12/2021 05/11/2021 05/11/2021  Glucose 70 - 99  mg/dL 128(H) 181(H) 123(H)  BUN 8 - 23 mg/dL 23 25(H) 26(H)  Creatinine 0.44 - 1.00 mg/dL 1.06(H) 1.11(H) 1.17(H)  Sodium 135 - 145 mmol/L 135 134(L) 135  Potassium 3.5 - 5.1 mmol/L 4.2 4.0 3.1(L)  Chloride 98 - 111 mmol/L 101 102 102  CO2 22 - 32 mmol/L 27 27 25   Calcium 8.9 - 10.3 mg/dL 8.0(L) 7.7(L) 7.7(L)  Total Protein 6.5 - 8.1 g/dL - - -  Total Bilirubin 0.3 - 1.2 mg/dL - - -  Alkaline Phos 38 - 126 U/L - - -  AST 15 - 41 U/L - - -  ALT 0 - 44 U/L - - -    CBC Latest Ref Rng & Units 05/12/2021 05/11/2021 05/10/2021  WBC 4.0 - 10.5 K/uL 16.9(H) 13.7(H) -  Hemoglobin 12.0 - 15.0 g/dL 9.4(L) 9.3(L) 9.9(L)  Hematocrit 36.0 - 46.0 % 33.2(L) 31.8(L) 29.0(L)  Platelets 150 - 400 K/uL 165 117(L) -    ABG    Component Value Date/Time   PHART 7.283 (L)  05/10/2021 0603   PCO2ART 57.7 (H) 05/10/2021 0603   PO2ART 83 05/10/2021 0603   HCO3 27.6 05/10/2021 0603   TCO2 29 05/10/2021 0603   O2SAT 95.0 05/10/2021 0603    CBG (last 3)  Recent Labs    05/11/21 1950 05/12/21 0034 05/12/21 0403  GLUCAP 143* 118* 110*     Critical care time: Shady Dale, D.O.  Internal Medicine Resident, PGY-3 [facility]  Pager: [phone] 6:31 AM, 05/12/2021

## 2021-05-13 ENCOUNTER — Inpatient Hospital Stay (HOSPITAL_COMMUNITY): Payer: Medicare Other

## 2021-05-13 DIAGNOSIS — J9622 Acute and chronic respiratory failure with hypercapnia: Secondary | ICD-10-CM | POA: Diagnosis not present

## 2021-05-13 DIAGNOSIS — J9621 Acute and chronic respiratory failure with hypoxia: Secondary | ICD-10-CM | POA: Diagnosis not present

## 2021-05-13 DIAGNOSIS — I480 Paroxysmal atrial fibrillation: Secondary | ICD-10-CM | POA: Diagnosis not present

## 2021-05-13 LAB — RENAL FUNCTION PANEL
Albumin: 2 g/dL — ABNORMAL LOW (ref 3.5–5.0)
Albumin: 1.9 g/dL — ABNORMAL LOW (ref 3.5–5.0)
Anion gap: 6 (ref 5–15)
Anion gap: 7 (ref 5–15)
BUN: 23 mg/dL (ref 8–23)
BUN: 22 mg/dL (ref 8–23)
CO2: 28 mmol/L (ref 22–32)
CO2: 26 mmol/L (ref 22–32)
Calcium: 8.3 mg/dL — ABNORMAL LOW (ref 8.9–10.3)
Calcium: 8.4 mg/dL — ABNORMAL LOW (ref 8.9–10.3)
Chloride: 102 mmol/L (ref 98–111)
Chloride: 103 mmol/L (ref 98–111)
Creatinine, Ser: 1.01 mg/dL — ABNORMAL HIGH (ref 0.44–1.00)
Creatinine, Ser: 1.03 mg/dL — ABNORMAL HIGH (ref 0.44–1.00)
GFR, Estimated: 59 mL/min — ABNORMAL LOW
GFR, Estimated: 57 mL/min — ABNORMAL LOW
Glucose, Bld: 159 mg/dL — ABNORMAL HIGH (ref 70–99)
Glucose, Bld: 196 mg/dL — ABNORMAL HIGH (ref 70–99)
Phosphorus: 1.7 mg/dL — ABNORMAL LOW (ref 2.5–4.6)
Phosphorus: 2.6 mg/dL (ref 2.5–4.6)
Potassium: 4.6 mmol/L (ref 3.5–5.1)
Potassium: 5 mmol/L (ref 3.5–5.1)
Sodium: 136 mmol/L (ref 135–145)
Sodium: 136 mmol/L (ref 135–145)

## 2021-05-13 LAB — MAGNESIUM: Magnesium: 2.6 mg/dL — ABNORMAL HIGH (ref 1.7–2.4)

## 2021-05-13 LAB — GLUCOSE, CAPILLARY
Glucose-Capillary: 149 mg/dL — ABNORMAL HIGH (ref 70–99)
Glucose-Capillary: 146 mg/dL — ABNORMAL HIGH (ref 70–99)
Glucose-Capillary: 116 mg/dL — ABNORMAL HIGH (ref 70–99)
Glucose-Capillary: 158 mg/dL — ABNORMAL HIGH (ref 70–99)
Glucose-Capillary: 208 mg/dL — ABNORMAL HIGH (ref 70–99)
Glucose-Capillary: 156 mg/dL — ABNORMAL HIGH (ref 70–99)

## 2021-05-13 MED ORDER — PREDNISONE 20 MG PO TABS
40.0000 mg | ORAL_TABLET | Freq: Every day | ORAL | Status: DC
  Administered 2021-05-13 – 2021-05-15 (×3): 40 mg
  Filled 2021-05-13 (×3): qty 2

## 2021-05-13 MED ORDER — POTASSIUM & SODIUM PHOSPHATES 280-160-250 MG PO PACK: 1.0000 | PACK | Freq: Three times a day (TID) | ORAL | Status: DC

## 2021-05-13 MED ORDER — PREDNISONE 20 MG PO TABS: 40.0000 mg | ORAL_TABLET | Freq: Every day | ORAL | Status: DC

## 2021-05-13 MED ORDER — POTASSIUM PHOSPHATES 15 MMOLE/5ML IV SOLN
15.0000 mmol | Freq: Once | INTRAVENOUS | Status: DC
  Filled 2021-05-13: qty 5

## 2021-05-13 MED ORDER — SODIUM PHOSPHATES 45 MMOLE/15ML IV SOLN
15.0000 mmol | Freq: Once | INTRAVENOUS | Status: AC
  Administered 2021-05-13: 15 mmol via INTRAVENOUS
  Filled 2021-05-13: qty 5

## 2021-05-13 MED ORDER — PREDNISONE 5 MG/5ML PO SOLN: 40.0000 mg | Freq: Every day | ORAL | Status: DC

## 2021-05-13 NOTE — Progress Notes (Signed)
NAME:  Lisa Hamilton, MRN:  [id number], DOB:  January 11, 1946, LOS: 27 ADMISSION DATE:  04/30/2021, CONSULTATION DATE:  9/29 REFERRING MD:  Lorin Mercy, CHIEF COMPLAINT:  Cardiac arrest   History of Present Illness:  75 y/o female with COPD admitted for COPD exacerbation, she had an aspiration event leading to a cardiac arrest.  She also has a pericardial effusion which may be due to her COVID booster.  She has been dependent on mechanical ventilation and CRRT during her ICU stay.  Pertinent  Medical History  Anemia, Diverticulosis, DJD, GERD, Glaucoma, Hepatic cyst, Hiatal hernia, Colon polyps, HLD, HTN, Medullary sponge kidney, Goiter, Osteoporosis, SBO, Nephrolithiasis, RLS, Shingles, Vit D deficiency, prediabetes, COPD, chronic respiratory failure with hypoxemia on 2-3 L [place]  Significant Hospital Events: Including procedures, antibiotic start and stop dates in addition to other pertinent events   9/28 Admit 9/29 respiratory leading to cardiac arrest, intubated and transferred to ICU 9/30 tonic-clonic seizure and started on keppra, developed transient A fib and started on amiodarone, started on peripheral phenylephrine, Lt IJ CVL placed 10/1 back in sinus rhythm, off pressors, remains on increased PEEP/FiO2 10/3 - HD cath placed, started CRRT 10/04 - Chest tube placed for dependent consolidation.  10/05 - HD line clotted, replaced HD line in Rt IJ 10/06 - recurrent afib with rvr and titrating down Iso 10/07 titrated off pressors, family meeting> DNR, continue aggressive care for a few days  Procedures 9/30 L IJ CVL  >  9/30 ETT >  10/5 L chest tube >  10/6 R IJ CVL >   ABX 9/29 cefepime x1, 10/3 > 10/7 9/29 vanc x1, 10/4, 10/5 9/30 azithro > 10/4 9/30 ceftriaxone > 10/4  Culture  9/29 sars cov 2/flu > neg 9/30 resp viral panel > neg 10/3 BAL > normal resp flora  Imaging/diagnostic studies: 12/2019 PFT Ratio 58%, FEV1 0.64L 30% pred 9/30 CT head > unremarkable CT head of brain 10/1  Echo > LVEF 60-65%, RV normal, large pericardial effusion, possibly RA collapse 10/3 echo > LVEF 55-60%, large pericardial effusion 10/4 echo > LVEF 60-65%, RA dilated, large effusion, RA collapse, no RV collapse, amaller compared to 10/3 10/7 Echo > LVEF 60-65%, RV size/function normal, no change in the size of the effusion, no RA collapse 10/4 CT chest > small to mod lef pneumothorax, severe bilateral air space disease, bilateral pleural efusions R > L, large pericardial effusion, cholelithiasis  Interim History / Subjective:  No acute events  Getting phosporous replacement Remains on high level vent support No bowel movements since 10/7  Objective   Blood pressure 122/74, pulse 98, temperature 98.2 F (36.8 C), temperature source Axillary, resp. rate (!) 23, height 5' (1.524 m), weight 107.6 kg, SpO2 93 %. CVP:  [8 mmHg-11 mmHg] 11 mmHg  Vent Mode: PRVC FiO2 (%):  [70 %] 70 % Set Rate:  [25 bmp] 25 bmp Vt Set:  [370 mL] 370 mL PEEP:  [10 cmH20] 10 cmH20 Plateau Pressure:  [26 cmH20-30 cmH20] 30 cmH20   Intake/Output Summary (Last 24 hours) at 05/13/2021 0809 Last data filed at 05/13/2021 0800 Gross per 24 hour  Intake 2476.71 ml  Output 4813 ml  Net -2336.29 ml   Filed Weights   05/09/21 0500 05/12/21 0500 05/13/21 0500  Weight: 110.5 kg 109.5 kg 107.6 kg    Examination: General:  Chronically ill appearing, in bed on vent HENT: NCAT ETT in place PULM: Wheezing B, vent supported breathing CV: RRR, no mgr GI: BS+, soft, nontender  MSK: normal bulk and tone Neuro: sedated on vent   Resolved Hospital Problem list     Assessment & Plan:  Acute on chronic respiratory failure with hypercarbia and hypoxemia Acute pulmonary edema ARDS from aspiration pneumonitis Advanced COPD baseline > more wheezing 10/9 L pneumothorax Continue brovana/pulmicort Full mechanical vent support VAP prevention Daily WUA/SBT Continue chest tube to suction Duoneb now Start prednisone 10/9  due to increased wheezing Continue to attempt to wean off FiO2 for SaO2 > 88%  Large pericardial effusion believed to be due to COVID vaccine Monitor hemodynamics  Intermittent atrial fibrillation Hypotension Tele Monitor off hemodynamics  Oliguric renal failure Continue CRRT Monitor BMET and UOP Replace electrolytes as needed  Constipation Ileus Continue lactulose scheduled  Acute metabolic encephalopathy from hypoxemia/hypercarbia Isolated convulsions after cardiac arrest on 09/30 Continue PAD protocol, fentanyl infusion as ordered Continue clonazepam  Steroid induced hyperglycemia SSI to continue  Osteoporosis   Best Practice (right click and "Reselect all SmartList Selections" daily)   Diet/type: tubefeeds DVT prophylaxis: prophylactic heparin  GI prophylaxis: PPI Lines: Central line and yes and it is still needed Foley:  Yes, and it is still needed Code Status:  DNR Last date of multidisciplinary goals of care discussion [10/7, plan again on 10/9]  Labs   CBC: Recent Labs  Lab 05/08/21 0508 05/09/21 0005 05/09/21 0500 05/10/21 0501 05/10/21 0603 05/11/21 0523 05/12/21 0500  WBC 18.8*  --  18.5* 12.2*  --  13.7* 16.9*  HGB 10.2*   < > 10.4* 8.7* 9.9* 9.3* 9.4*  HCT 35.1*   < > 35.5* 29.3* 29.0* 31.8* 33.2*  MCV 100.3*  --  100.9* 99.7  --  99.7 101.5*  PLT 204  --  163 PLATELET CLUMPS NOTED ON SMEAR, UNABLE TO ESTIMATE  --  117* 165   < > = values in this interval not displayed.    Basic Metabolic Panel: Recent Labs  Lab 05/09/21 0500 05/09/21 0823 05/10/21 0501 05/10/21 0603 05/11/21 0523 05/11/21 1550 05/12/21 0500 05/12/21 2112 05/13/21 0523  NA 136   < > 137   < > 135 134* 135 135 136  K 4.7   < > 4.4   < > 3.1* 4.0 4.2 4.8 4.6  CL 99   < > 100   < > 102 102 101 101 102  CO2 27   < > 28   < > _0 GLUCOSE 145*   < > 195*   < > 123* 181* 128* 165* 159*  BUN 57*   < > 46*   < > 26* 25* 23 24* 23  CREATININE 2.17*   < >  1.72*   < > 1.17* 1.11* 1.06* 0.99 1.01*  CALCIUM 7.4*   < > 7.5*   < > 7.7* 7.7* 8.0* 8.1* 8.3*  MG 2.6*  --  2.3  --  2.5*  --  2.6*  --  2.6*  PHOS 4.3   < > 3.8   < > 2.3* 2.4* 1.8* 2.0* 1.7*   < > = values in this interval not displayed.   GFR: Estimated Creatinine Clearance: 55.1 mL/min (A) (by C-G formula based on SCr of 1.01 mg/dL (H)). Recent Labs  Lab 05/09/21 0500 05/10/21 0501 05/11/21 0523 05/12/21 0500  WBC 18.5* 12.2* 13.7* 16.9*    Liver Function Tests: Recent Labs  Lab 05/06/2021 0419 05/30/2021 1600 05/11/21 0523 05/11/21 1550 05/12/21 0500 05/12/21 2112 05/13/21 0523  AST 26  --   --   --   --   --   --  ALT 22  --   --   --   --   --   --   ALKPHOS 95  --   --   --   --   --   --   BILITOT 0.7  --   --   --   --   --   --   PROT 5.0*  --   --   --   --   --   --   ALBUMIN 2.1*   < > 1.8* 2.1* 2.0* 2.0* 2.0*   < > = values in this interval not displayed.   No results for input(s): LIPASE, AMYLASE in the last 168 hours. No results for input(s): AMMONIA in the last 168 hours.  ABG    Component Value Date/Time   PHART 7.283 (L) 05/10/2021 0603   PCO2ART 57.7 (H) 05/10/2021 0603   PO2ART 83 05/10/2021 0603   HCO3 27.6 05/10/2021 0603   TCO2 29 05/10/2021 0603   O2SAT 95.0 05/10/2021 0603     Coagulation Profile: No results for input(s): INR, PROTIME in the last 168 hours.  Cardiac Enzymes: No results for input(s): CKTOTAL, CKMB, CKMBINDEX, TROPONINI in the last 168 hours.  HbA1C: Hgb A1c MFr Bld  Date/Time Value Ref Range Status  05/05/2021 03:30 AM 5.8 (H) 4.8 - 5.6 % Final    Comment:    (NOTE) Pre diabetes:          5.7%-6.4%  Diabetes:              >6.4%  Glycemic control for   <7.0% adults with diabetes   05/04/2021 03:04 AM 5.7 (H) 4.8 - 5.6 % Final    Comment:    (NOTE) Pre diabetes:          5.7%-6.4%  Diabetes:              >6.4%  Glycemic control for   <7.0% adults with diabetes     CBG: Recent Labs  Lab  05/12/21 1523 05/12/21 1927 05/12/21 2334 05/13/21 0334 05/13/21 0723  GLUCAP 193* 155* 129* 149* 146*    Critical care time: 35 minutes    [name], MD [facility] Pager: [phone] Cell: [phone] After 7:00 pm call Elink  [phone]

## 2021-05-13 NOTE — Progress Notes (Signed)
No significant clinical change. Hemodynamically stable, mechanically venilated. Remains oligoanuric on CRRT. Maintaining sinus rhythm - last AFib was 72 hours ago. DNR. Will continue to monitor for arrhythmia recurrence.

## 2021-05-13 NOTE — Progress Notes (Signed)
[facility]  Called to bedside emergently for severe hypoxemia No breath sounds on left Stat CXR> normal Flushed chest tube  Performed emergent bronchoscopy> thick mucus plugging left lower mainstem, improved oxygenation Family updated bedside and by phone  [name], MD [facility] Pager: [phone] Cell: [phone] After 7:00 pm call Elink  [phone]

## 2021-05-13 NOTE — Progress Notes (Signed)
[facility] Progress Note   Subjective:   I/Os yesterday 2.4 / 4.8, still net +3.4 for admission. Now up to 70% FiO2. SBp 120-140s, no vasopressors. No family at bedside this AM but son and husband have been present a lot.  Objective Vitals:   05/13/21 0600 05/13/21 0630 05/13/21 0700 05/13/21 0741  BP: 119/77 120/70 124/72   Pulse: 100 98 98 97  Resp: (!) 23 (!) 26 (!) 27 (!) 25  Temp: (!) 97.3 F (36.3 C) (!) 97.5 F (36.4 C) (!) 97.3 F (36.3 C)   TempSrc:      SpO2: 94% 96% 94%   Weight:      Height:       Physical Exam General: intubated, sedated Heart: NSR on monitor Lungs: coarse, FiO2 50%, L chest tube noted Extremities: 1+ diffuse edema improving Dialysis Access:  RIJ temp HD cath  Additional Objective Labs: Basic Metabolic Panel: Recent Labs  Lab 05/12/21 0500 05/12/21 2112 05/13/21 0523  NA 135 135 136  K 4.2 4.8 4.6  CL 101 101 102  CO2 27 27 28   GLUCOSE 128* 165* 159*  BUN 23 24* 23  CREATININE 1.06* 0.99 1.01*  CALCIUM 8.0* 8.1* 8.3*  PHOS 1.8* 2.0* 1.7*    Liver Function Tests: Recent Labs  Lab 05/23/2021 0419 06/01/2021 1600 05/12/21 0500 05/12/21 2112 05/13/21 0523  AST 26  --   --   --   --   ALT 22  --   --   --   --   ALKPHOS 95  --   --   --   --   BILITOT 0.7  --   --   --   --   PROT 5.0*  --   --   --   --   ALBUMIN 2.1*   < > 2.0* 2.0* 2.0*   < > = values in this interval not displayed.    No results for input(s): LIPASE, AMYLASE in the last 168 hours. CBC: Recent Labs  Lab 05/08/21 0508 05/09/21 0005 05/09/21 0500 05/10/21 0501 05/10/21 0603 05/11/21 0523 05/12/21 0500  WBC 18.8*  --  18.5* 12.2*  --  13.7* 16.9*  HGB 10.2*   < > 10.4* 8.7* 9.9* 9.3* 9.4*  HCT 35.1*   < > 35.5* 29.3* 29.0* 31.8* 33.2*  MCV 100.3*  --  100.9* 99.7  --  99.7 101.5*  PLT 204  --  163 PLATELET CLUMPS NOTED ON SMEAR, UNABLE TO ESTIMATE  --  117* 165   < > = values in this interval not displayed.    Blood Culture     Component Value Date/Time   SDES BRONCHIAL ALVEOLAR LAVAGE 05/16/2021 1721   SPECREQUEST NONE 05/27/2021 1721   CULT  05/11/2021 1721    Normal respiratory flora-no Staph aureus or Pseudomonas seen Performed at [facility], [address], [address], [place]    REPTSTATUS 05/09/2021 FINAL 05/20/2021 1721    Cardiac Enzymes: No results for input(s): CKTOTAL, CKMB, CKMBINDEX, TROPONINI in the last 168 hours. CBG: Recent Labs  Lab 05/12/21 1523 05/12/21 1927 05/12/21 2334 05/13/21 0334 05/13/21 0723  GLUCAP 193* 155* 129* 149* 146*    Iron Studies: No results for input(s): IRON, TIBC, TRANSFERRIN, FERRITIN in the last 72 hours. @lablastinr3 @ Studies/Results: DG Abd Portable 1V  Result Date: 05/11/2021 CLINICAL DATA:  No bowel movement for 8 days until last night. Two subsequent bouts of diarrhea. History of small-bowel obstruction. EXAM: PORTABLE ABDOMEN -  1 VIEW COMPARISON:  Radiographs 05/04/2021 and 10/15/2017.  CT 09/07/2019. FINDINGS: 1057 hours. Two views obtained. Feeding tube tip projects over the right mid abdomen, likely in the distal stomach or proximal small bowel. There is new at least moderate diffuse bowel distension, especially in the right lower quadrant, which appears to be predominantly colonic. There may be mild associated bowel wall thickening. No evidence of pneumatosis or free air on supine imaging. Cardiomegaly, patchy opacities at both lung bases and small bilateral pleural effusions are noted. There are degenerative changes throughout the spine associated with a thoracolumbar scoliosis. IMPRESSION: Diffuse bowel distension, predominately colonic, likely reflecting an ileus. If concern of bowel obstruction, recommend radiographic follow-up with erect views or CT for further assessment. Electronically Signed   By: Richardean Sale M.D.   On: 05/11/2021 14:51   ECHOCARDIOGRAM LIMITED  Result Date: 05/11/2021    ECHOCARDIOGRAM LIMITED REPORT    Patient Name:   Lisa Hamilton Date of Exam: 05/11/2021 Medical Rec #:  [id number]    Height:       60.0 in Accession #:    [id number]   Weight:       243.6 lb Date of Birth:  03-20-1946   BSA:          2.030 m Patient Age:    75 years     BP:           120/59 mmHg Patient Gender: F            HR:           75 bpm. Exam Location:  Inpatient Procedure: Limited Echo, Limited Color Doppler and Cardiac Doppler Indications:    Pericardial effusion I31.3  History:        Patient has prior history of Echocardiogram examinations, most                 recent 05/08/2021. COPD; Risk Factors:Dyslipidemia and                 Hypertension.  Sonographer:    Bernadene Person RDCS Referring Phys: [id number] [facility]  Sonographer Comments: Echo performed with patient supine and on artificial respirator. IMPRESSIONS  1. Left ventricular ejection fraction, by estimation, is 60 to 65%. The left ventricle has normal function. The left ventricle has no regional wall motion abnormalities.  2. Right ventricular systolic function is normal. The right ventricular size is normal.  3. When compared to prior ECHO from 05/08/21, there is no significant change in the size of pericardial effusion. Remains large. Pericardial effusion 2.1 cm posteriorly. However, there does not appear to be RA diastolic collapse as was seen three days prior. Large pericardial effusion. The pericardial effusion is circumferential.  4. The mitral valve is grossly normal.  5. The aortic valve is grossly normal.  6. The inferior vena cava is dilated in size with <50% respiratory variability, suggesting right atrial pressure of 15 mmHg. FINDINGS  Left Ventricle: Left ventricular ejection fraction, by estimation, is 60 to 65%. The left ventricle has normal function. The left ventricle has no regional wall motion abnormalities. The left ventricular internal cavity size was normal in size. There is  no left ventricular hypertrophy. Right Ventricle: The right ventricular size  is normal. No increase in right ventricular wall thickness. Right ventricular systolic function is normal. Left Atrium: Left atrial size was normal in size. Right Atrium: Right atrial size was normal in size. Pericardium: When compared to prior ECHO from 05/08/21, there is no  significant change in the size of pericardial effusion. Remains large. Pericardial effusion 2.1 cm posteriorly. However, there does not appear to be RA diastolic collapse as was seen three days prior. A large pericardial effusion is present. The pericardial effusion is circumferential. Mitral Valve: The mitral valve is grossly normal. Tricuspid Valve: The tricuspid valve is normal in structure. Tricuspid valve regurgitation is not demonstrated. No evidence of tricuspid stenosis. Aortic Valve: The aortic valve is grossly normal. Aorta: The aortic root is normal in size and structure. Venous: The inferior vena cava is dilated in size with less than 50% respiratory variability, suggesting right atrial pressure of 15 mmHg. LEFT VENTRICLE PLAX 2D LVIDd:         4.60 cm LVIDs:         3.10 cm LV PW:         1.20 cm LV IVS:        1.10 cm  LEFT ATRIUM         Index LA diam:    2.80 cm 1.38 cm/m Candee Furbish MD Electronically signed by Candee Furbish MD Signature Date/Time: 05/11/2021/2:02:50 PM    Final    Medications:   prismasol BGK 4/2.5 300 mL/hr at 05/12/21 2029    prismasol BGK 4/2.5 300 mL/hr at 05/12/21 2032   sodium chloride 10 mL/hr at 05/13/21 0700   feeding supplement (VITAL 1.5 CAL) 1,000 mL (05/11/21 1033)   fentaNYL infusion INTRAVENOUS 200 mcg/hr (05/13/21 0700)   heparin 10,000 units/ 20 mL infusion syringe 1,000 Units/hr (05/13/21 0125)   prismasol BGK 4/2.5 1,500 mL/hr at 05/13/21 0640    arformoterol  15 mcg Nebulization BID   B-complex with vitamin C  1 tablet Per Tube Daily   budesonide (PULMICORT) nebulizer solution  0.5 mg Nebulization BID   chlorhexidine gluconate (MEDLINE KIT)  15 mL Mouth Rinse BID   Chlorhexidine  Gluconate Cloth  6 each Topical Daily   clonazePAM  1 mg Per Tube QHS   feeding supplement (PROSource TF)  45 mL Per Tube BID   gabapentin  100 mg Per Tube Q8H   heparin injection (subcutaneous)  5,000 Units Subcutaneous Q8H   insulin aspart  0-20 Units Subcutaneous Q4H   lactulose  20 g Per Tube TID   levETIRAcetam  500 mg Per Tube BID   mouth rinse  15 mL Mouth Rinse 10 times per day   montelukast  10 mg Per Tube QPM   pantoprazole sodium  40 mg Per Tube Daily   polyethylene glycol  17 g Per Tube BID   pravastatin  40 mg Per Tube Daily   revefenacin  175 mcg Nebulization Daily   senna-docusate  2 tablet Per Tube BID   sodium chloride flush  10-40 mL Intracatheter Q12H   sodium chloride flush  3 mL Intravenous Q12H    Assessment/Plan **AHRF, multifocal PNA: on significant vent support per primary currently. Steroids, empiric abx. Volume management with CRRT.  Eval for DAH/alveolitis per primary - doesn't look like vasculitis based on serologies.   **AKI, severe, oliguric:  Multifactorial with contributions of contrast, hypotension > ATN.  Developed hyperkalemia, concern for AMS from uremia and was started on CRRT 10/3.   Renal US 10/3 with BL nonobstructing stones.  UA with hematuria - foley sample.  With DAH noted c/f vasculitis but her presenting creatinine was normal and her course of AKI is more consistent with hemodynamic insults and contrast nephropathy than a GN; I do not think a kidney biopsy is  indicated nor is she stable enough for such.  --Cont CRRT today but if remains hemodynamically stable would transition to iHD in coming days unless GOC change --> to be readdressed on Mon by primary  --cont current net 50-100 neg/hr; made good progress yesterday and I think she's getting close to intravascular euvolemia.  Would not add pressors to facilitate UF.  --Avoid nephrotoxins and hypotension as able   **A fib: on amiodarone; in NSR currently.  Cardiology following.     **Pericardial effusion:  no tamponade and smaller on exam 10/3.  The effusion preceeded AKI/significant BUN elevation so not consistent with uremic etiology.  Thought to be COVID vaccine related.  Jannifer Hick MD 05/13/2021, 7:59 AM  [facility] Pager: [phone]

## 2021-05-13 NOTE — Op Note (Signed)
Bronchoscopy Procedure Note  DESIRAE MANCUSI  [id number]  05-Dec-1945  Date:05/13/21  Time:11:19 AM   Provider Performing:Brent Navjot Loera   Procedure(s):  Initial Therapeutic Aspiration of Tracheobronchial Tree [phone])  Indication(s) Mucus plugging of left mainstem bronchus causing acute respiratory failure with hypoxemia  Consent Risks of the procedure as well as the alternatives and risks of each were explained to the patient and/or caregiver.  Consent for the procedure was obtained and is signed in the bedside chart  Anesthesia Fentanyl infusion per ICU PAD protocol   Time Out Verified patient identification, verified procedure, site/side was marked, verified correct patient position, special equipment/implants available, medications/allergies/relevant history reviewed, required imaging and test results available.   Sterile Technique Usual hand hygiene, masks, gowns, and gloves were used   Procedure Description Bronchoscope advanced through endotracheal tube and into airway.  Airways were examined down to subsegmental level with findings noted below.   Following diagnostic evaluation, Therapeutic aspiration performed in left mainstem bronchus  Findings: The trachea was normal, the carina was sharp, the right tracheobronchial tree was normal in appearance. However the left mainstem bronchus was full of thick brown, bloody mucus.  This was suctioned with some difficulty, requiring saline injection and repeated aspiration attempts. At the conclusion of the procedure the left tracheobronchial tree was patent.  There was mild oozing noted in the left lower lobe proximal orifice, no clear source of bleeding identified other than friable bronchial mucosa.   Complications/Tolerance None; patient tolerated the procedure well. Chest X-ray is not needed post procedure.   EBL Minimal   Specimen(s) BAL resp culture  [name], MD [facility] Pager: [phone] Cell:  [phone] After 7:00 pm call Elink  [phone]

## 2021-05-13 NOTE — Progress Notes (Signed)
Pt 's saturations decreased to low 70's with no improvement with suctioning or increase in FiO2 to 100%. RT assisted CCM with bedside bronchoscopy. Pt tolerated well with SVS and improvement in SpO2.

## 2021-05-14 ENCOUNTER — Encounter (HOSPITAL_COMMUNITY): Payer: Self-pay | Admitting: Pulmonary Disease

## 2021-05-14 ENCOUNTER — Inpatient Hospital Stay (HOSPITAL_COMMUNITY): Payer: Medicare Other

## 2021-05-14 DIAGNOSIS — J9621 Acute and chronic respiratory failure with hypoxia: Secondary | ICD-10-CM | POA: Diagnosis not present

## 2021-05-14 LAB — CBC WITH DIFFERENTIAL/PLATELET
Abs Immature Granulocytes: 0.46 10*3/uL — ABNORMAL HIGH (ref 0.00–0.07)
Basophils Absolute: 0 10*3/uL (ref 0.0–0.1)
Basophils Relative: 0 %
Eosinophils Absolute: 0.1 10*3/uL (ref 0.0–0.5)
Eosinophils Relative: 0 %
HCT: 36.4 % (ref 36.0–46.0)
Hemoglobin: 10.7 g/dL — ABNORMAL LOW (ref 12.0–15.0)
Immature Granulocytes: 2 %
Lymphocytes Relative: 3 %
Lymphs Abs: 0.7 10*3/uL (ref 0.7–4.0)
MCH: 29.5 pg (ref 26.0–34.0)
MCHC: 29.4 g/dL — ABNORMAL LOW (ref 30.0–36.0)
MCV: 100.3 fL — ABNORMAL HIGH (ref 80.0–100.0)
Monocytes Absolute: 1.2 10*3/uL — ABNORMAL HIGH (ref 0.1–1.0)
Monocytes Relative: 6 %
Neutro Abs: 18.9 10*3/uL — ABNORMAL HIGH (ref 1.7–7.7)
Neutrophils Relative %: 89 %
Platelets: 219 10*3/uL (ref 150–400)
RBC: 3.63 MIL/uL — ABNORMAL LOW (ref 3.87–5.11)
RDW: 15.2 % (ref 11.5–15.5)
WBC: 21.4 10*3/uL — ABNORMAL HIGH (ref 4.0–10.5)
nRBC: 0.4 % — ABNORMAL HIGH (ref 0.0–0.2)

## 2021-05-14 LAB — COMPREHENSIVE METABOLIC PANEL
ALT: 22 U/L (ref 0–44)
AST: 21 U/L (ref 15–41)
Albumin: 2.1 g/dL — ABNORMAL LOW (ref 3.5–5.0)
Alkaline Phosphatase: 121 U/L (ref 38–126)
Anion gap: 6 (ref 5–15)
BUN: 21 mg/dL (ref 8–23)
CO2: 28 mmol/L (ref 22–32)
Calcium: 8.4 mg/dL — ABNORMAL LOW (ref 8.9–10.3)
Chloride: 101 mmol/L (ref 98–111)
Creatinine, Ser: 0.97 mg/dL (ref 0.44–1.00)
GFR, Estimated: >60 mL/min (ref >60)
Glucose, Bld: 137 mg/dL — ABNORMAL HIGH (ref 70–99)
Potassium: 5.1 mmol/L (ref 3.5–5.1)
Sodium: 135 mmol/L (ref 135–145)
Total Bilirubin: 0.7 mg/dL (ref 0.3–1.2)
Total Protein: 5.7 g/dL — ABNORMAL LOW (ref 6.5–8.1)

## 2021-05-14 LAB — GLUCOSE, CAPILLARY
Glucose-Capillary: 124 mg/dL — ABNORMAL HIGH (ref 70–99)
Glucose-Capillary: 170 mg/dL — ABNORMAL HIGH (ref 70–99)
Glucose-Capillary: 189 mg/dL — ABNORMAL HIGH (ref 70–99)
Glucose-Capillary: 86 mg/dL (ref 70–99)
Glucose-Capillary: 150 mg/dL — ABNORMAL HIGH (ref 70–99)
Glucose-Capillary: 66 mg/dL — ABNORMAL LOW (ref 70–99)

## 2021-05-14 LAB — RENAL FUNCTION PANEL
Albumin: 1.9 g/dL — ABNORMAL LOW (ref 3.5–5.0)
Anion gap: 8 (ref 5–15)
BUN: 23 mg/dL (ref 8–23)
CO2: 27 mmol/L (ref 22–32)
Calcium: 8.3 mg/dL — ABNORMAL LOW (ref 8.9–10.3)
Chloride: 101 mmol/L (ref 98–111)
Creatinine, Ser: 0.98 mg/dL (ref 0.44–1.00)
GFR, Estimated: >60 mL/min (ref >60)
Glucose, Bld: 195 mg/dL — ABNORMAL HIGH (ref 70–99)
Phosphorus: 4.8 mg/dL — ABNORMAL HIGH (ref 2.5–4.6)
Potassium: 5.1 mmol/L (ref 3.5–5.1)
Sodium: 136 mmol/L (ref 135–145)

## 2021-05-14 LAB — MAGNESIUM: Magnesium: 2.7 mg/dL — ABNORMAL HIGH (ref 1.7–2.4)

## 2021-05-14 LAB — PHOSPHORUS: Phosphorus: 2.4 mg/dL — ABNORMAL LOW (ref 2.5–4.6)

## 2021-05-14 MED ORDER — SODIUM PHOSPHATES 45 MMOLE/15ML IV SOLN
20.0000 mmol | Freq: Once | INTRAVENOUS | Status: AC
  Administered 2021-05-14: 20 mmol via INTRAVENOUS
  Filled 2021-05-14: qty 6.67

## 2021-05-14 MED ORDER — SODIUM CHLORIDE 3 % IN NEBU
4.0000 mL | INHALATION_SOLUTION | Freq: Two times a day (BID) | RESPIRATORY_TRACT | Status: DC
  Administered 2021-05-15 – 2021-05-16 (×2): 4 mL via RESPIRATORY_TRACT
  Filled 2021-05-14 (×3): qty 4

## 2021-05-14 MED ORDER — GUAIFENESIN 100 MG/5ML PO SOLN
10.0000 mL | Freq: Four times a day (QID) | ORAL | Status: DC
  Administered 2021-05-14 – 2021-05-16 (×8): 200 mg
  Filled 2021-05-14 (×8): qty 10

## 2021-05-14 MED ORDER — FLEET ENEMA 7-19 GM/118ML RE ENEM
1.0000 | ENEMA | Freq: Once | RECTAL | Status: AC | PRN
  Administered 2021-05-14: 1 via RECTAL
  Filled 2021-05-14: qty 1

## 2021-05-14 NOTE — Progress Notes (Signed)
Patient ID: Lisa Hamilton, female   DOB: [birth year], 75 y.o.   MRN: [id number] [facility] Progress Note   Assessment/ Plan:   1. Acute kidney Injury: Multifactorial etiology with ischemic ATN and contrast.  Anuric and without any evidence of renal recovery to date.  Continue CRRT at the current prescription with net -50 cc an hour unless blood pressures/CVP start to drop further (current CVP 7 cmH2O).  If hypotensive, back off to net even fluid balance.  Overall poor prognosis and I would support transitioning to comfort care as she is unlikely to be able to tolerate intermittent hemodialysis with her respiratory issues. 2.  Acute hypoxic respiratory failure: With multifocal pneumonia and yesterday had emergent bronchoscopy with evacuation of thick mucous plugging from left lower mainstem bronchus.  Bronchoscopy did not really show evidence of DAH. 3.  Atrial fibrillation: Mildly tachycardic, continue to follow on amiodarone. 4.  Anemia of critical illness: Hemoglobin and hematocrit currently at goal without any overt loss.  Subjective:   With worsening hypoxemia yesterday requiring emergent bronchoscopy-mucous plugging seen.  No problems with CRRT overnight.   Objective:   BP 93/66 (BP Location: Right Wrist)   Pulse (!) 109   Temp 98.1 F (36.7 C)   Resp 19   Ht 5' (1.524 m)   Wt 104.3 kg   SpO2 92%   BMI 44.91 kg/m   Intake/Output Summary (Last 24 hours) at 05/14/2021 0835 Last data filed at 05/14/2021 0800 Gross per 24 hour  Intake 2611.38 ml  Output 4363 ml  Net -1751.62 ml   Weight change: -3.3 kg  Physical Exam: Gen: Intubated, unresponsive to voice/touch CVS: Pulse regular tachycardia, S1 and S2 normal.  Right IJ TDC connected to CRRT Resp: Coarse breath sounds bilaterally without distinct rales or rhonchi, left-sided chest tube. Abd: Soft, obese, nontender Ext: 1+ anasarca noted  Imaging: DG Chest Port 1 View  Result Date: 05/14/2021 CLINICAL DATA:   Respiratory failure EXAM: PORTABLE CHEST 1 VIEW COMPARISON:  05/13/2021 FINDINGS: Endotracheal and enteric tubes remain satisfactorily positioned. Left and right internal jugular central venous catheters also remain in place. Left-sided chest tube is also unchanged. Stable cardiomegaly. Diffuse interstitial opacities throughout both lungs with slightly improved aeration within the right lung. Bilateral pleural effusions. No pneumothorax. IMPRESSION: Slightly improved aeration within the right lung. Otherwise no significant interval change. Electronically Signed   By: Davina Poke D.O.   On: 05/14/2021 08:05   DG CHEST PORT 1 VIEW  Result Date: 05/13/2021 CLINICAL DATA:  Decline in oxygen saturation EXAM: PORTABLE CHEST 1 VIEW COMPARISON:  Portable exam 1105 hours compared to 05/11/2021 FINDINGS: Tip of endotracheal tube projects 3.5 cm above carina. Feeding tube extends into stomach. Tips of BILATERAL jugular central venous catheters project over SVC. Pigtail LEFT thoracostomy tube present. Enlargement of cardiac silhouette. Increased atelectasis versus consolidation in LEFT lung, favor atelectasis. Bibasilar effusions with persistent RIGHT lung infiltrates especially RIGHT upper lobe. No pneumothorax. Atherosclerotic calcification aortic arch. Bones demineralized. IMPRESSION: Increased atelectasis versus less likely consolidation in LEFT lung. Bibasilar pleural effusions and atelectasis with persistent RIGHT upper lobe infiltrate. Enlargement of cardiac silhouette. LEFT thoracostomy tube without pneumothorax. Electronically Signed   By: Lavonia Dana M.D.   On: 05/13/2021 11:21    Labs: BMET Recent Labs  Lab 05/11/21 0523 05/11/21 1550 05/12/21 0500 05/12/21 2112 05/13/21 0523 05/13/21 2130 05/14/21 0348  NA 135 134* 135 135 136 136 135  K 3.1* 4.0 4.2 4.8 4.6 5.0 5.1  CL 102  102 101 101 102 103 101  CO2 $Re'25 27 27 27 28 26 28  'jQc$ GLUCOSE 123* 181* 128* 165* 159* 196* 137*  BUN 26* 25* 23 24* $Remo'23  22 21  'VGJPP$ CREATININE 1.17* 1.11* 1.06* 0.99 1.01* 1.03* 0.97  CALCIUM 7.7* 7.7* 8.0* 8.1* 8.3* 8.4* 8.4*  PHOS 2.3* 2.4* 1.8* 2.0* 1.7* 2.6 2.4*   CBC Recent Labs  Lab 05/10/21 0501 05/10/21 0603 05/11/21 0523 05/12/21 0500 05/14/21 0348  WBC 12.2*  --  13.7* 16.9* 21.4*  NEUTROABS  --   --   --   --  18.9*  HGB 8.7* 9.9* 9.3* 9.4* 10.7*  HCT 29.3* 29.0* 31.8* 33.2* 36.4  MCV 99.7  --  99.7 101.5* 100.3*  PLT PLATELET CLUMPS NOTED ON SMEAR, UNABLE TO ESTIMATE  --  117* 165 219    Medications:     arformoterol  15 mcg Nebulization BID   B-complex with vitamin C  1 tablet Per Tube Daily   budesonide (PULMICORT) nebulizer solution  0.5 mg Nebulization BID   chlorhexidine gluconate (MEDLINE KIT)  15 mL Mouth Rinse BID   Chlorhexidine Gluconate Cloth  6 each Topical Daily   clonazePAM  1 mg Per Tube QHS   feeding supplement (PROSource TF)  45 mL Per Tube BID   gabapentin  100 mg Per Tube Q8H   heparin injection (subcutaneous)  5,000 Units Subcutaneous Q8H   insulin aspart  0-20 Units Subcutaneous Q4H   lactulose  20 g Per Tube TID   mouth rinse  15 mL Mouth Rinse 10 times per day   montelukast  10 mg Per Tube QPM   pantoprazole sodium  40 mg Per Tube Daily   polyethylene glycol  17 g Per Tube BID   pravastatin  40 mg Per Tube Daily   predniSONE  40 mg Per Tube Q breakfast   revefenacin  175 mcg Nebulization Daily   senna-docusate  2 tablet Per Tube BID   sodium chloride flush  10-40 mL Intracatheter Q12H   sodium chloride flush  3 mL Intravenous Q12H   Elmarie Shiley, MD 05/14/2021, 8:35 AM

## 2021-05-14 NOTE — Procedures (Signed)
Bronchoscopy Procedure Note  Lisa Hamilton  [id number]  [birth year]  Date:05/14/21  Time:11:44 AM   Provider Performing:Brent Sehaj Mcenroe   Procedure(s):  Subsequent Therapeutic Aspiration of Tracheobronchial Tree [phone])  Indication(s) Mucus plugging left lung, acute respiratory failure with hypoxemia  Consent Risks of the procedure as well as the alternatives and risks of each were explained to the patient and/or caregiver.  Consent for the procedure was obtained and is signed in the bedside chart  Anesthesia Fentanyl infusion   Time Out Verified patient identification, verified procedure, site/side was marked, verified correct patient position, special equipment/implants available, medications/allergies/relevant history reviewed, required imaging and test results available.   Sterile Technique Usual hand hygiene, masks, gowns, and gloves were used   Procedure Description Bronchoscope advanced through endotracheal tube and into airway.  Airways were examined down to subsegmental level with findings noted below.   Following diagnostic evaluation, Therapeutic aspiration performed in right lower lobe and left lower lobe  Findings: thick, bloody secretions in left lower lobe completely obstructing the orifice.  Also some in the right lower lobe, partial obstruction of the medial and posterior basal segments.  Both were removed with suctioning.   Complications/Tolerance None; patient tolerated the procedure well. Chest X-ray is not needed post procedure.   EBL Minimal   Specimen(s) None immediate  [name], MD [place] [facility] Pager: [phone] Cell: [phone] After 7:00 pm call Elink  [phone]

## 2021-05-14 NOTE — Progress Notes (Signed)
Attending: Seen and examined independently today, discussed with Marni Griffon and we formulated the plan together.     Subjective: Awake today, wants to write a message to her family No other acute events  Objective: Vitals:   05/14/21 1138 05/14/21 1146 05/14/21 1200 05/14/21 1230  BP: 131/89   (!) 98/46  Pulse: 94 88 88 80  Resp: (!) 25 (!) 26 (!) 25 (!) 23  Temp: (!) 97.2 F (36.2 C)  (!) 97.3 F (36.3 C) (!) 97.3 F (36.3 C)  TempSrc:      SpO2: (!) 81% 99% 100% 100%  Weight:      Height:       Vent Mode: PRVC FiO2 (%):  [50 %-100 %] 100 % Set Rate:  [25 bmp] 25 bmp Vt Set:  [370 mL] 370 mL PEEP:  [10 cmH20] 10 cmH20 Plateau Pressure:  [22 cmH20-27 cmH20] 27 cmH20  Intake/Output Summary (Last 24 hours) at 05/14/2021 1342 Last data filed at 05/14/2021 1300 Gross per 24 hour  Intake 2470.97 ml  Output 4195 ml  Net -1724.03 ml    General:  In bed on vent HENT: NCAT ETT in place PULM: Rhonchi B, vent supported breathing CV: RRR, no mgr GI: BS+, soft, nontender MSK: normal bulk and tone Neuro: awake, follows commands    CBC    Component Value Date/Time   WBC 21.4 (H) 05/14/2021 0348   RBC 3.63 (L) 05/14/2021 0348   HGB 10.7 (L) 05/14/2021 0348   HCT 36.4 05/14/2021 0348   PLT 219 05/14/2021 0348   MCV 100.3 (H) 05/14/2021 0348   MCH 29.5 05/14/2021 0348   MCHC 29.4 (L) 05/14/2021 0348   RDW 15.2 05/14/2021 0348   LYMPHSABS 0.7 05/14/2021 0348   MONOABS 1.2 (H) 05/14/2021 0348   EOSABS 0.1 05/14/2021 0348   BASOSABS 0.0 05/14/2021 0348    BMET    Component Value Date/Time   NA 135 05/14/2021 0348   NA 140 05/30/2015 0000   K 5.1 05/14/2021 0348   CL 101 05/14/2021 0348   CO2 28 05/14/2021 0348   GLUCOSE 137 (H) 05/14/2021 0348   BUN 21 05/14/2021 0348   BUN 20 05/25/2014 0000   CREATININE 0.97 05/14/2021 0348   CALCIUM 8.4 (L) 05/14/2021 0348   GFRNONAA >60 05/14/2021 0348   GFRAA >60 12/11/2017 0736    CXR images left chest tube in  place, improved aeration right lung, personally reviewed  Impression/Plan: Acute respiratory failure with hypoxemia due to ARDS from aspiration pneumonia, complicated by severe baseline COPD and intermittent mucus plugging> continue full mechanical ventilator support, add hypertonic saline and guaifenesin and chest PT, continue bronchodilators, wean PEEP/FiO2 to matinatin O2 saturation > 88%, f/u bronch culture from 10/9 AKI> continue CRRT Pneumothorax> continue left chest tube water seal  Goals of care> suspect family would like to withdraw care, but they plan to wait on the decision until their brother Ronalee Belts is in town, will have meeting with them tomorrow  My cc time 35 minutes  [name], MD [place] Pager: [phone] Cell: [phone] After 7pm: [phone]

## 2021-05-14 NOTE — Progress Notes (Signed)
NAME:  Lisa Hamilton, MRN:  [id number], DOB:  [birth year], LOS: 61 ADMISSION DATE:  04/27/2021, CONSULTATION DATE:  9/29 REFERRING MD:  Lorin Mercy, CHIEF COMPLAINT:  Cardiac arrest   History of Present Illness:  75 y/o female with COPD admitted for COPD exacerbation, she had an aspiration event leading to a cardiac arrest.  She also has a pericardial effusion which may be due to her COVID booster.  She has been dependent on mechanical ventilation and CRRT during her ICU stay.  Pertinent  Medical History  Anemia, Diverticulosis, DJD, GERD, Glaucoma, Hepatic cyst, Hiatal hernia, Colon polyps, HLD, HTN, Medullary sponge kidney, Goiter, Osteoporosis, SBO, Nephrolithiasis, RLS, Shingles, Vit D deficiency, prediabetes, COPD, chronic respiratory failure with hypoxemia on 2-3 L [place]  Significant Hospital Events: Including procedures, antibiotic start and stop dates in addition to other pertinent events   9/28 Admit 9/29 respiratory leading to cardiac arrest, intubated and transferred to ICU. Cefepime and vanc started 9/30 tonic-clonic seizure and started on keppra, developed transient A fib and started on amiodarone, started on peripheral phenylephrine, Lt IJ CVL placed.  10/1 back in sinus rhythm, off pressors, remains on increased PEEP/FiO2. 10/1 Echo > LVEF 60-65%, RV normal, large pericardial effusion, possibly RA collapse 10/3 - HD cath placed, started CRRT. 10/3 echo > LVEF 55-60%, large pericardial effusion. ETT exchange for plugging 10/04 - 10/4 CT chest > small to mod lef pneumothorax, severe bilateral air space disease, bilateral pleural efusions R > L, large pericardial effusion, cholelithiasis Chest tube placed on left. 10/4 echo > LVEF 60-65%, RA dilated, large effusion, RA collapse, no RV collapse, amaller compared to 10/3 10/05 - HD line clotted, replaced HD line in Rt IJ 10/06 - recurrent afib with rvr and titrating down. Right IJ CVL placed.  10/07 titrated off pressors, family meeting> DNR,  continue aggressive care for a few days. All cultures negative. Cefepime stopped.  Echo > LVEF 60-65%, RV size/function normal, no change in the size of the effusion, no RA collapse 10/9 acutely hypoxic w/ no breath sounds on left. Emergent bronch w/ lavage w/ thick mucous plugging involving the LLL. Sats improved after clearing.   Interim History / Subjective:   Stable on vent. Pulling volume   Objective   Blood pressure (Abnormal) 88/61, pulse 93, temperature 98.1 F (36.7 C), resp. rate (Abnormal) 23, height 5' (1.524 m), weight 104.3 kg, SpO2 92 %. CVP:  [8 mmHg-11 mmHg] 8 mmHg  Vent Mode: PRVC FiO2 (%):  [50 %-70 %] 50 % Set Rate:  [25 bmp] 25 bmp Vt Set:  [370 mL] 370 mL PEEP:  [10 cmH20] 10 cmH20 Plateau Pressure:  [24 cmH20-30 cmH20] 24 cmH20   Intake/Output Summary (Last 24 hours) at 05/14/2021 0738 Last data filed at 05/14/2021 0272 Gross per 24 hour  Intake 2623.49 ml  Output 4398 ml  Net -1774.51 ml   Filed Weights   05/12/21 0500 05/13/21 0500 05/14/21 0423  Weight: 109.5 kg 107.6 kg 104.3 kg    Examination: General: This is a chronically ill-appearing 75 year old female who remains on fairly high ventilatory support and oxygen support HEENT normocephalic atraumatic no jugular venous distention appreciated however this is difficult to assess given body habitus and central access pupils reactive sclera nonicteric she is orally intubated Pulmonary: Coarse scattered rhonchi bilaterally.  She has a left small bore chest tube in place there is no airleak noted.  Minimal output from chest tube.  Current plateau pressure measured at 28, she is currently on PEEP of  10 and FiO2 of 60% Cardiac: Regular rate and rhythm Abdomen: Soft, currently on tube feeds and tolerating current rate bowel sounds present Extremities: Warm dry with brisk capillary refill, trace LE edema   Resolved Hospital Problem list   Cardiac arrest   Assessment & Plan:  Acute on chronic respiratory  failure with hypercarbia and hypoxemia 2/2 aspiration PNA c/b  Acute pulmonary edema and ARDS, AECOPD (baseline advanced COPD) and spont Left PTX Pcxr persistent R>L airspace disease. No PTX on left. Tubes and lines good position. Overall no sig change except left may be a little better aerated than yesterday  O2/vent requirements:  Plan Cont full vent support VAP bundle PAD protocol  Daily WUA and assessment for SBT Cont Brovana/pulmicort and Duonebs Cont left chest tube to sxn Pred day 2/5 for AECOPD  Large pericardial effusion believed to be due to COVID vaccine Plan  monitor hemodynamics  Intermittent atrial fibrillation Hypotension Plan Cont tele Holding all antihypertensives May need to back off   Oliguric renal failure Labs reviewed, current vol status: +1.6 liters Plan CRRT: pulling 50 to 100 net neg; cont as BP allows may be able to just stop CRRT all together for today ? Day to re equilibrate then decide on iHD?  Serial labs Cont strict I&O  Constipation Ileus ? BMs recorded last 10/8 and 10/9 Plan Continue scheduled lactulose  PRN smog   Acute metabolic encephalopathy from hypoxemia/hypercarbia Isolated convulsions after cardiac arrest on 09/30 Plan Clonazepam VT PAD protocol RASS goal   Leukocytosis Plan Monitor   Steroid induced hyperglycemia Current glycemic control acceptable Plan Cont ssi Goal 140-180 range  Osteoporosis Plan Holding Fosamax  Best Practice (right click and "Reselect all SmartList Selections" daily)   Diet/type: tubefeeds DVT prophylaxis: prophylactic heparin  GI prophylaxis: PPI Lines: Central line and yes and it is still needed Foley:  Yes, and it is still needed Code Status:  DNR Last date of multidisciplinary goals of care discussion [10/7, plan again on 10/9]   Critical care time 34 min      Erick Colace ACNP-BC [place] Pager # [phone] OR # [phone] if no answer

## 2021-05-14 NOTE — Progress Notes (Signed)
Pt's saturations decreased to low 80's. RT and RN attempted to suction pt and increase FiO2 with no improvement. MD called to bedside to assess pt. CCM requested bedside bronchoscopy. RT assisted CCM with bedside bronch. Pt tolerated well with SVS and improvement in SpO2. RT will continue to monitor pt.

## 2021-05-15 DIAGNOSIS — N179 Acute kidney failure, unspecified: Secondary | ICD-10-CM | POA: Diagnosis not present

## 2021-05-15 DIAGNOSIS — J9622 Acute and chronic respiratory failure with hypercapnia: Secondary | ICD-10-CM | POA: Diagnosis not present

## 2021-05-15 DIAGNOSIS — J9621 Acute and chronic respiratory failure with hypoxia: Secondary | ICD-10-CM | POA: Diagnosis not present

## 2021-05-15 LAB — CBC
HCT: 37.7 % (ref 36.0–46.0)
Hemoglobin: 10.8 g/dL — ABNORMAL LOW (ref 12.0–15.0)
MCH: 28.9 pg (ref 26.0–34.0)
MCHC: 28.6 g/dL — ABNORMAL LOW (ref 30.0–36.0)
MCV: 100.8 fL — ABNORMAL HIGH (ref 80.0–100.0)
Platelets: 227 10*3/uL (ref 150–400)
RBC: 3.74 MIL/uL — ABNORMAL LOW (ref 3.87–5.11)
RDW: 15.8 % — ABNORMAL HIGH (ref 11.5–15.5)
WBC: 28.9 10*3/uL — ABNORMAL HIGH (ref 4.0–10.5)
nRBC: 0.4 % — ABNORMAL HIGH (ref 0.0–0.2)

## 2021-05-15 LAB — RENAL FUNCTION PANEL
Albumin: 1.9 g/dL — ABNORMAL LOW (ref 3.5–5.0)
Albumin: 1.9 g/dL — ABNORMAL LOW (ref 3.5–5.0)
Anion gap: 9 (ref 5–15)
Anion gap: 11 (ref 5–15)
BUN: 23 mg/dL (ref 8–23)
BUN: 24 mg/dL — ABNORMAL HIGH (ref 8–23)
CO2: 27 mmol/L (ref 22–32)
CO2: 25 mmol/L (ref 22–32)
Calcium: 8.7 mg/dL — ABNORMAL LOW (ref 8.9–10.3)
Calcium: 8.2 mg/dL — ABNORMAL LOW (ref 8.9–10.3)
Chloride: 100 mmol/L (ref 98–111)
Chloride: 100 mmol/L (ref 98–111)
Creatinine, Ser: 0.95 mg/dL (ref 0.44–1.00)
Creatinine, Ser: 0.99 mg/dL (ref 0.44–1.00)
GFR, Estimated: >60 mL/min (ref >60)
GFR, Estimated: >60 mL/min (ref >60)
Glucose, Bld: 134 mg/dL — ABNORMAL HIGH (ref 70–99)
Glucose, Bld: 209 mg/dL — ABNORMAL HIGH (ref 70–99)
Phosphorus: 3.2 mg/dL (ref 2.5–4.6)
Phosphorus: 3.2 mg/dL (ref 2.5–4.6)
Potassium: 4.1 mmol/L (ref 3.5–5.1)
Potassium: 4.4 mmol/L (ref 3.5–5.1)
Sodium: 136 mmol/L (ref 135–145)
Sodium: 136 mmol/L (ref 135–145)

## 2021-05-15 LAB — GLUCOSE, CAPILLARY
Glucose-Capillary: 111 mg/dL — ABNORMAL HIGH (ref 70–99)
Glucose-Capillary: 126 mg/dL — ABNORMAL HIGH (ref 70–99)
Glucose-Capillary: 130 mg/dL — ABNORMAL HIGH (ref 70–99)
Glucose-Capillary: 134 mg/dL — ABNORMAL HIGH (ref 70–99)
Glucose-Capillary: 145 mg/dL — ABNORMAL HIGH (ref 70–99)
Glucose-Capillary: 80 mg/dL (ref 70–99)

## 2021-05-15 LAB — MAGNESIUM: Magnesium: 2.9 mg/dL — ABNORMAL HIGH (ref 1.7–2.4)

## 2021-05-15 LAB — CULTURE, RESPIRATORY W GRAM STAIN: Culture: NORMAL

## 2021-05-15 MED ORDER — NOREPINEPHRINE 4 MG/250ML-% IV SOLN: 0.0000 ug/min | INTRAVENOUS | Status: DC

## 2021-05-15 MED ORDER — AMIODARONE HCL IN DEXTROSE 360-4.14 MG/200ML-% IV SOLN
30.0000 mg/h | INTRAVENOUS | Status: DC
  Administered 2021-05-16 – 2021-05-17 (×2): 30 mg/h via INTRAVENOUS
  Filled 2021-05-15 (×3): qty 200

## 2021-05-15 MED ORDER — LACTULOSE 10 GM/15ML PO SOLN
20.0000 g | Freq: Two times a day (BID) | ORAL | Status: DC
  Administered 2021-05-15: 20 g
  Filled 2021-05-15: qty 30

## 2021-05-15 MED ORDER — INSULIN ASPART 100 UNIT/ML IJ SOLN
0.0000 [IU] | INTRAMUSCULAR | Status: DC
  Administered 2021-05-15 – 2021-05-16 (×4): 1 [IU] via SUBCUTANEOUS

## 2021-05-15 MED ORDER — AMIODARONE HCL IN DEXTROSE 360-4.14 MG/200ML-% IV SOLN
INTRAVENOUS | Status: AC
  Filled 2021-05-15: qty 200

## 2021-05-15 MED ORDER — K PHOS MONO-SOD PHOS DI & MONO 155-852-130 MG PO TABS
250.0000 mg | ORAL_TABLET | Freq: Once | ORAL | Status: AC
  Administered 2021-05-15: 250 mg
  Filled 2021-05-15: qty 1

## 2021-05-15 MED ORDER — NOREPINEPHRINE 4 MG/250ML-% IV SOLN
0.0000 ug/min | INTRAVENOUS | Status: DC
  Administered 2021-05-15: 5 ug/min via INTRAVENOUS
  Administered 2021-05-15: 15 ug/min via INTRAVENOUS
  Filled 2021-05-15 (×2): qty 250

## 2021-05-15 MED ORDER — HEPARIN (PORCINE) 2000 UNITS/L FOR CRRT
INTRAVENOUS_CENTRAL | Status: DC | PRN
  Administered 2021-05-15: 2000 mL via INTRAVENOUS_CENTRAL

## 2021-05-15 MED ORDER — NOREPINEPHRINE 16 MG/250ML-% IV SOLN
0.0000 ug/min | INTRAVENOUS | Status: DC
  Administered 2021-05-15: 19 ug/min via INTRAVENOUS
  Administered 2021-05-16: 18 ug/min via INTRAVENOUS
  Administered 2021-05-17: 30 ug/min via INTRAVENOUS
  Administered 2021-05-17: 28 ug/min via INTRAVENOUS
  Filled 2021-05-15 (×4): qty 250

## 2021-05-15 MED ORDER — WHITE PETROLATUM EX OINT
TOPICAL_OINTMENT | CUTANEOUS | Status: AC
  Filled 2021-05-15: qty 28.35

## 2021-05-15 MED ORDER — AMIODARONE LOAD VIA INFUSION
150.0000 mg | Freq: Once | INTRAVENOUS | Status: AC
  Administered 2021-05-15: 150 mg via INTRAVENOUS

## 2021-05-15 MED ORDER — HEPARIN (PORCINE) IN NACL 2-0.9 UNITS/ML: INTRAMUSCULAR | Status: DC

## 2021-05-15 MED ORDER — AMIODARONE HCL IN DEXTROSE 360-4.14 MG/200ML-% IV SOLN
60.0000 mg/h | INTRAVENOUS | Status: AC
  Administered 2021-05-15 (×2): 60 mg/h via INTRAVENOUS
  Filled 2021-05-15: qty 200

## 2021-05-15 MED ORDER — NOREPINEPHRINE-SODIUM CHLORIDE 16-0.9 MG/250ML-% IV SOLN
0.0000 ug/min | INTRAVENOUS | Status: DC
  Filled 2021-05-15: qty 250

## 2021-05-15 NOTE — Progress Notes (Signed)
Patient with bile colored emesis .zofran given and tube feeding stopped per orders ,

## 2021-05-15 NOTE — Progress Notes (Signed)
NAME:  Lisa Hamilton, MRN:  [id number], DOB:  1945/09/28, LOS: 12 ADMISSION DATE:  04/23/2021, CONSULTATION DATE:  9/29 REFERRING MD:  Lorin Mercy, CHIEF COMPLAINT:  Cardiac arrest   History of Present Illness:  75 y/o female with COPD admitted for COPD exacerbation, she had an aspiration event leading to a cardiac arrest.  She also has a pericardial effusion which may be due to her COVID booster.  She has been dependent on mechanical ventilation and CRRT during her ICU stay.  Pertinent  Medical History  Anemia, Diverticulosis, DJD, GERD, Glaucoma, Hepatic cyst, Hiatal hernia, Colon polyps, HLD, HTN, Medullary sponge kidney, Goiter, Osteoporosis, SBO, Nephrolithiasis, RLS, Shingles, Vit D deficiency, prediabetes, COPD, chronic respiratory failure with hypoxemia on 2-3 L [place]  Significant Hospital Events: Including procedures, antibiotic start and stop dates in addition to other pertinent events   9/28 Admit 9/29 respiratory leading to cardiac arrest, intubated and transferred to ICU. Cefepime and vanc started 9/30 tonic-clonic seizure and started on keppra, developed transient A fib and started on amiodarone, started on peripheral phenylephrine, Lt IJ CVL placed.  10/1 back in sinus rhythm, off pressors, remains on increased PEEP/FiO2. 10/1 Echo > LVEF 60-65%, RV normal, large pericardial effusion, possibly RA collapse 10/3 - HD cath placed, started CRRT. 10/3 echo > LVEF 55-60%, large pericardial effusion. ETT exchange for plugging 10/04 - 10/4 CT chest > small to mod lef pneumothorax, severe bilateral air space disease, bilateral pleural efusions R > L, large pericardial effusion, cholelithiasis Chest tube placed on left. 10/4 echo > LVEF 60-65%, RA dilated, large effusion, RA collapse, no RV collapse, amaller compared to 10/3 10/05 - HD line clotted, replaced HD line in Rt IJ 10/06 - recurrent afib with rvr and titrating down. Right IJ CVL placed.  10/07 titrated off pressors, family meeting> DNR,  continue aggressive care for a few days. All cultures negative. Cefepime stopped.  Echo > LVEF 60-65%, RV size/function normal, no change in the size of the effusion, no RA collapse 10/9 acutely hypoxic w/ no breath sounds on left. Emergent bronch w/ lavage w/ thick mucous plugging involving the LLL. Sats improved after clearing.   Interim History / Subjective:  Patient remained stable on ventilator. She is diaphoretic and appears critically ill.  Objective   Blood pressure 105/62, pulse 96, temperature (!) 97.3 F (36.3 C), temperature source Esophageal, resp. rate (!) 25, height 5' (1.524 m), weight 104.3 kg, SpO2 98 %. CVP:  [7 mmHg-11 mmHg] 11 mmHg  Vent Mode: PRVC FiO2 (%):  [50 %-100 %] 70 % Set Rate:  [25 bmp] 25 bmp Vt Set:  [370 mL] 370 mL PEEP:  [10 cmH20] 10 cmH20 Plateau Pressure:  [22 cmH20-29 cmH20] 29 cmH20   Intake/Output Summary (Last 24 hours) at 05/15/2021 0544 Last data filed at 05/15/2021 0500 Gross per 24 hour  Intake 2391.98 ml  Output 3938 ml  Net -1546.02 ml   Filed Weights   05/12/21 0500 05/13/21 0500 05/14/21 0423  Weight: 109.5 kg 107.6 kg 104.3 kg    Examination: General: Diaphoretic, chronically ill-appearing 75 year old female who remains on fairly high ventilatory support and oxygen support HEENT - ETT in place, PERRLA Pulmonary: Coarse scattered rhonchi bilaterally. Chest tube on the left with minimal output. Remains intubated with Ppeak of 32 PEEP of  10 and FiO2 of 70% Cardiac: Regular rate and rhythm Abdomen: Soft, currently on tube feeds and tolerating current rate bowel sounds present Extremities: Warm dry with brisk capillary refill, trace LE edema  Patient Lines/Drains/Airways Status     Active Line/Drains/Airways     Name Placement date Placement time Site Days   CVC Triple Lumen 05/04/21 Left Internal jugular 05/04/21  1800  -- 11   Hemodialysis Catheter Right Internal jugular Triple lumen Temporary (Non-Tunneled) 05/09/21  1715   Internal jugular  6   Chest Tube 1 Left;Medial Mediastinal 05/08/21  1730  Mediastinal  7   Airway 8.5 mm 06/03/2021  1730  -- 8   Small Bore Feeding Tube 10 Fr. Right nare Marking at nare/corner of mouth 82 cm 05/09/21  1434  Right nare  6   Pressure Injury 05/11/21 Face Left;Medial 05/11/21  0600  -- 4   Wound / Incision (Open or Dehisced) 05/13/21 Skin tear Abdomen Right;Lower Under abdominal fold 05/13/21  2000  Abdomen  2            Resolved Hospital Problem list   Cardiac arrest   Assessment & Plan:  Acute on chronic respiratory failure with hypercarbia and hypoxemia 2/2 aspiration PNA c/b  Acute pulmonary edema and ARDS, AECOPD (baseline advanced COPD) and spont Left PTX Patient's respiratory status remains stable from yesterday.  She continues to have significant O2 requirements.  No significant change from yesterday.  Pcxr persistent R>L airspace disease. No PTX on left. Tubes and lines good position.  Patient is net 200 cc since admission with 1.5 L out in the last 24 hours from CRRT. She will likely need additional goals of care conversation today. O2/vent requirements:  Cont full vent support VAP bundle PAD protocol  Daily WUA and assessment for SBT Cont Brovana/pulmicort and Duonebs Cont left chest tube to sxn Pred day 3/5 for AECOPD  Large pericardial effusion believed to be due to COVID vaccine Continue to monitor hemodynamics  Intermittent atrial fibrillation Hypotension Cont to have intermittent atrial fibrillation. She was in sinus rhythm overnight but now in A. fib with RVR with rates in the 120s.  She is on prophylactic heparin and not on any pressors or any AV nodal blocking agents.  Blood pressures are still soft with pressures of 105/62 and maps greater than 65. - May need amio depending on goals of care - Cont tele - Holding all antihypertensives   Oliguric renal failure Patient has remained on CRRT currently net +200 cc since admission and is net 1.5 L  negative in last 24 hours.  None of which is urine output.  We will need to discuss future plans for renal replacement therapy. -We will have a goals of care conversation today and discussed need for additional CRRT versus HD. -Monitor urine output and kidney function - Cont strict I&O  Constipation Ileus ? BMs recorded last 10/8 and 10/9 Patient has not had a bowel movement last 24 hours.  Nurse reports that her bowel movement yesterday were small liquid bowel movements.  Her abdomen remains fairly distended today with positive bowel sounds.  May need to consider possible CT scan depending on goals of care. -Continue lactulose 3 times daily   Acute metabolic encephalopathy from hypoxemia/hypercarbia Isolated convulsions after cardiac arrest on 09/30 Clonazepam VT PAD protocol RASS goal   Leukocytosis Likely secondary to ongoing critical illness. -Continue to monitor closely.  Steroid induced hyperglycemia - Cont SSI - Goal 140-180 range  Osteoporosis Plan Holding Fosamax  Best Practice (right click and "Reselect all SmartList Selections" daily)   Diet/type: tubefeeds DVT prophylaxis: prophylactic heparin  GI prophylaxis: PPI Lines: Central line and yes and it is still needed  Foley: Yes, and it is still needed Code Status:  DNR Last date of multidisciplinary goals of care discussion [10/7, plan again on 10/9]   Critical care time: [place], D.O.  Internal Medicine Resident, PGY-3 [facility]  Pager: [phone] 5:46 AM, 05/15/2021

## 2021-05-15 NOTE — Progress Notes (Signed)
Patient ID: Lisa Hamilton, female   DOB: Dec 31, 1945, 75 y.o.   MRN: [id number] [facility] Progress Note   Assessment/ Plan:   1. Acute kidney Injury: Multifactorial etiology with ischemic ATN and contrast.  Anuric and without any evidence of renal recovery to date.  Continue CRRT at this time for supportive management of volume status and to maintain control of azotemia/electrolytes.  Poor candidate for chronic dialysis with her deconditioning and anticipated respiratory challenges.  Per discussion yesterday, appropriate to transition to comfort care if/when family make decision. 2.  Acute hypoxic respiratory failure: With multifocal pneumonia and on 10/9 had emergent bronchoscopy with evacuation of thick mucous plugging from left lower mainstem bronchus.   3.  Atrial fibrillation: Currently in sinus rhythm and rate regulated.  On amiodarone 4.  Anemia of critical illness: Hemoglobin and hematocrit currently at goal without any overt loss.  Subjective:   Hemodynamic fluctuation overnight with atrial fibrillation prompting discontinuation of ultrafiltration with CRRT-currently running even from a fluid balance   Objective:   BP (!) 81/53   Pulse 94   Temp (!) 97.3 F (36.3 C)   Resp (!) 25   Ht 5' (1.524 m)   Wt 104 kg   SpO2 97%   BMI 44.78 kg/m   Intake/Output Summary (Last 24 hours) at 05/15/2021 0754 Last data filed at 05/15/2021 0700 Gross per 24 hour  Intake 2391.95 ml  Output 3865 ml  Net -1473.05 ml   Weight change: -0.3 kg  Physical Exam: Gen: Intubated, awakens to calling her voice and able to follow commands CVS: Pulse regular rhythm/normal rate, S1 and S2 normal.  Right IJ TDC connected to CRRT Resp: Coarse breath sounds bilaterally without distinct rales or rhonchi, left-sided chest tube. Abd: Soft, obese, nontender Ext: 1-2+ upper extremity edema, trace-1+ lower extremity edema  Imaging: DG Chest Port 1 View  Result Date: 05/14/2021 CLINICAL  DATA:  Respiratory failure EXAM: PORTABLE CHEST 1 VIEW COMPARISON:  05/13/2021 FINDINGS: Endotracheal and enteric tubes remain satisfactorily positioned. Left and right internal jugular central venous catheters also remain in place. Left-sided chest tube is also unchanged. Stable cardiomegaly. Diffuse interstitial opacities throughout both lungs with slightly improved aeration within the right lung. Bilateral pleural effusions. No pneumothorax. IMPRESSION: Slightly improved aeration within the right lung. Otherwise no significant interval change. Electronically Signed   By: Davina Poke D.O.   On: 05/14/2021 08:05   DG CHEST PORT 1 VIEW  Result Date: 05/13/2021 CLINICAL DATA:  Decline in oxygen saturation EXAM: PORTABLE CHEST 1 VIEW COMPARISON:  Portable exam 1105 hours compared to 05/11/2021 FINDINGS: Tip of endotracheal tube projects 3.5 cm above carina. Feeding tube extends into stomach. Tips of BILATERAL jugular central venous catheters project over SVC. Pigtail LEFT thoracostomy tube present. Enlargement of cardiac silhouette. Increased atelectasis versus consolidation in LEFT lung, favor atelectasis. Bibasilar effusions with persistent RIGHT lung infiltrates especially RIGHT upper lobe. No pneumothorax. Atherosclerotic calcification aortic arch. Bones demineralized. IMPRESSION: Increased atelectasis versus less likely consolidation in LEFT lung. Bibasilar pleural effusions and atelectasis with persistent RIGHT upper lobe infiltrate. Enlargement of cardiac silhouette. LEFT thoracostomy tube without pneumothorax. Electronically Signed   By: Lavonia Dana M.D.   On: 05/13/2021 11:21    Labs: BMET Recent Labs  Lab 05/12/21 0500 05/12/21 2112 05/13/21 0523 05/13/21 2130 05/14/21 0348 05/14/21 1508 05/15/21 0454  NA 135 135 136 136 135 136 136  K 4.2 4.8 4.6 5.0 5.1 5.1 4.1  CL 101 101 102 103 101 101  100  CO2 _0 GLUCOSE 128* 165* 159* 196* 137* 195* 134*  BUN 23 24* _1 CREATININE 1.06* 0.99 1.01* 1.03* 0.97 0.98 0.95  CALCIUM 8.0* 8.1* 8.3* 8.4* 8.4* 8.3* 8.7*  PHOS 1.8* 2.0* 1.7* 2.6 2.4* 4.8* 3.2   CBC Recent Labs  Lab 05/11/21 0523 05/12/21 0500 05/14/21 0348 05/15/21 0454  WBC 13.7* 16.9* 21.4* 28.9*  NEUTROABS  --   --  18.9*  --   HGB 9.3* 9.4* 10.7* 10.8*  HCT 31.8* 33.2* 36.4 37.7  MCV 99.7 101.5* 100.3* 100.8*  PLT 117* 165 219 227    Medications:     arformoterol  15 mcg Nebulization BID   B-complex with vitamin C  1 tablet Per Tube Daily   budesonide (PULMICORT) nebulizer solution  0.5 mg Nebulization BID   chlorhexidine gluconate (MEDLINE KIT)  15 mL Mouth Rinse BID   Chlorhexidine Gluconate Cloth  6 each Topical Daily   clonazePAM  1 mg Per Tube QHS   feeding supplement (PROSource TF)  45 mL Per Tube BID   gabapentin  100 mg Per Tube Q8H   guaiFENesin  10 mL Per Tube Q6H   heparin injection (subcutaneous)  5,000 Units Subcutaneous Q8H   insulin aspart  0-9 Units Subcutaneous Q4H   lactulose  20 g Per Tube TID   mouth rinse  15 mL Mouth Rinse 10 times per day   montelukast  10 mg Per Tube QPM   pantoprazole sodium  40 mg Per Tube Daily   polyethylene glycol  17 g Per Tube BID   pravastatin  40 mg Per Tube Daily   predniSONE  40 mg Per Tube Q breakfast   revefenacin  175 mcg Nebulization Daily   senna-docusate  2 tablet Per Tube BID   sodium chloride flush  10-40 mL Intracatheter Q12H   sodium chloride flush  3 mL Intravenous Q12H   sodium chloride HYPERTONIC  4 mL Nebulization BID   Elmarie Shiley, MD 05/15/2021, 7:54 AM

## 2021-05-16 ENCOUNTER — Inpatient Hospital Stay (HOSPITAL_COMMUNITY): Payer: Medicare Other

## 2021-05-16 DIAGNOSIS — Z7189 Other specified counseling: Secondary | ICD-10-CM | POA: Diagnosis not present

## 2021-05-16 DIAGNOSIS — Z66 Do not resuscitate: Secondary | ICD-10-CM

## 2021-05-16 DIAGNOSIS — Z515 Encounter for palliative care: Secondary | ICD-10-CM | POA: Diagnosis not present

## 2021-05-16 DIAGNOSIS — J9621 Acute and chronic respiratory failure with hypoxia: Secondary | ICD-10-CM | POA: Diagnosis not present

## 2021-05-16 LAB — RENAL FUNCTION PANEL
Albumin: 1.8 g/dL — ABNORMAL LOW (ref 3.5–5.0)
Anion gap: 12 (ref 5–15)
BUN: 25 mg/dL — ABNORMAL HIGH (ref 8–23)
CO2: 23 mmol/L (ref 22–32)
Calcium: 8.2 mg/dL — ABNORMAL LOW (ref 8.9–10.3)
Chloride: 101 mmol/L (ref 98–111)
Creatinine, Ser: 1 mg/dL (ref 0.44–1.00)
GFR, Estimated: 59 mL/min — ABNORMAL LOW (ref >60)
Glucose, Bld: 162 mg/dL — ABNORMAL HIGH (ref 70–99)
Phosphorus: 3.6 mg/dL (ref 2.5–4.6)
Potassium: 5.1 mmol/L (ref 3.5–5.1)
Sodium: 136 mmol/L (ref 135–145)

## 2021-05-16 LAB — GLUCOSE, CAPILLARY
Glucose-Capillary: 120 mg/dL — ABNORMAL HIGH (ref 70–99)
Glucose-Capillary: 147 mg/dL — ABNORMAL HIGH (ref 70–99)
Glucose-Capillary: 151 mg/dL — ABNORMAL HIGH (ref 70–99)

## 2021-05-16 LAB — MAGNESIUM: Magnesium: 2.8 mg/dL — ABNORMAL HIGH (ref 1.7–2.4)

## 2021-05-16 MED ORDER — SODIUM CHLORIDE 0.9 % IV SOLN
2.0000 g | Freq: Two times a day (BID) | INTRAVENOUS | Status: DC
  Administered 2021-05-16: 2 g via INTRAVENOUS
  Filled 2021-05-16: qty 2

## 2021-05-16 MED ORDER — FENTANYL CITRATE (PF) 100 MCG/2ML IJ SOLN
25.0000 ug | INTRAMUSCULAR | Status: DC | PRN
  Administered 2021-05-17 (×2): 100 ug via INTRAVENOUS

## 2021-05-16 MED ORDER — METHYLPREDNISOLONE SODIUM SUCC 40 MG IJ SOLR: 40.0000 mg | INTRAMUSCULAR | Status: DC

## 2021-05-16 MED ORDER — VITAL 1.5 CAL PO LIQD: 1000.0000 mL | ORAL | Status: DC

## 2021-05-16 MED ORDER — METRONIDAZOLE 500 MG/100ML IV SOLN
500.0000 mg | Freq: Two times a day (BID) | INTRAVENOUS | Status: DC
  Administered 2021-05-16: 500 mg via INTRAVENOUS
  Filled 2021-05-16: qty 100

## 2021-05-16 MED ORDER — PANTOPRAZOLE SODIUM 40 MG IV SOLR: 40.0000 mg | INTRAVENOUS | Status: DC

## 2021-05-16 MED ORDER — LORAZEPAM 2 MG/ML IJ SOLN
1.0000 mg | Freq: Every day | INTRAMUSCULAR | Status: DC
  Administered 2021-05-16: 1 mg via INTRAVENOUS
  Filled 2021-05-16: qty 1

## 2021-05-16 NOTE — Progress Notes (Signed)
    Back in atrial fib with amiodarone IV added while goals of therapy discussed.  Please call with further questions.

## 2021-05-16 NOTE — Progress Notes (Addendum)
Patient ID: Lisa Hamilton, female   DOB: January 27, 1946, 75 y.o.   MRN: [id number] [facility] Progress Note   Assessment/ Plan:   1. Acute kidney Injury: Likely from a combination of ischemic ATN and contrast exposure.  She is anuric and without any evidence of renal recovery to date.  Continue CRRT at this time for metabolic evaluation with maintaining even fluid status (ultrafiltration limited by pressor dependency and intermittent hypotension associated with A. fib with RVR). 2.  Acute hypoxic respiratory failure: With multifocal pneumonia and on 10/9 had emergent bronchoscopy with evacuation of thick mucous plugging from left lower mainstem bronchus.  Awaiting additional meeting with palliative care to decide on additional care/tracheostomy. 3.  Atrial fibrillation: Currently in sinus rhythm and rate regulated.  On amiodarone 4.  Anemia of critical illness: Hemoglobin and hematocrit currently at goal without any overt loss.  Subjective:   Overnight with hypotension and restarted back on Levophed drip.  Her son and husband favor avoiding tracheostomy/PEG tube but she indicates that she would want it.  We will await input from palliative care    Objective:   BP (!) 106/57   Pulse 79   Temp (!) 97.2 F (36.2 C)   Resp (!) 25   Ht 5' (1.524 m)   Wt 101.5 kg   SpO2 95%   BMI 43.70 kg/m   Intake/Output Summary (Last 24 hours) at 05/16/2021 8502 Last data filed at 05/16/2021 0800 Gross per 24 hour  Intake 1891.91 ml  Output 2157 ml  Net -265.09 ml   Weight change: -2.5 kg  Physical Exam: Gen: Intubated, awakens to calling her voice and able to follow commands CVS: Pulse regular rhythm/normal rate, S1 and S2 normal.  Right IJ TDC connected to CRRT Resp: Coarse breath sounds bilaterally without distinct rales or rhonchi, left-sided chest tube. Abd: Soft, obese, nontender Ext: 1+ upper extremity edema, trace-1+ lower extremity edema  Imaging: No results  found.  Labs: BMET Recent Labs  Lab 05/13/21 0523 05/13/21 2130 05/14/21 0348 05/14/21 1508 05/15/21 0454 05/15/21 1506 05/16/21 0500  NA 136 136 135 136 136 136 136  K 4.6 5.0 5.1 5.1 4.1 4.4 5.1  CL 102 103 101 101 100 100 101  CO2 $Re'28 26 28 27 27 25 23  'hix$ GLUCOSE 159* 196* 137* 195* 134* 209* 162*  BUN $Re'23 22 21 23 23 'QFy$ 24* 25*  CREATININE 1.01* 1.03* 0.97 0.98 0.95 0.99 1.00  CALCIUM 8.3* 8.4* 8.4* 8.3* 8.7* 8.2* 8.2*  PHOS 1.7* 2.6 2.4* 4.8* 3.2 3.2 3.6   CBC Recent Labs  Lab 05/11/21 0523 05/12/21 0500 05/14/21 0348 05/15/21 0454  WBC 13.7* 16.9* 21.4* 28.9*  NEUTROABS  --   --  18.9*  --   HGB 9.3* 9.4* 10.7* 10.8*  HCT 31.8* 33.2* 36.4 37.7  MCV 99.7 101.5* 100.3* 100.8*  PLT 117* 165 219 227    Medications:     arformoterol  15 mcg Nebulization BID   B-complex with vitamin C  1 tablet Per Tube Daily   budesonide (PULMICORT) nebulizer solution  0.5 mg Nebulization BID   chlorhexidine gluconate (MEDLINE KIT)  15 mL Mouth Rinse BID   Chlorhexidine Gluconate Cloth  6 each Topical Daily   clonazePAM  1 mg Per Tube QHS   feeding supplement (PROSource TF)  45 mL Per Tube BID   gabapentin  100 mg Per Tube Q8H   guaiFENesin  10 mL Per Tube Q6H   heparin injection (subcutaneous)  5,000 Units  Subcutaneous Q8H   insulin aspart  0-9 Units Subcutaneous Q4H   lactulose  20 g Per Tube BID   mouth rinse  15 mL Mouth Rinse 10 times per day   montelukast  10 mg Per Tube QPM   pantoprazole sodium  40 mg Per Tube Daily   polyethylene glycol  17 g Per Tube BID   pravastatin  40 mg Per Tube Daily   predniSONE  40 mg Per Tube Q breakfast   revefenacin  175 mcg Nebulization Daily   senna-docusate  2 tablet Per Tube BID   sodium chloride flush  10-40 mL Intracatheter Q12H   sodium chloride flush  3 mL Intravenous Q12H   sodium chloride HYPERTONIC  4 mL Nebulization BID   Elmarie Shiley, MD 05/16/2021, 8:21 AM

## 2021-05-16 NOTE — Progress Notes (Signed)
NAME:  Lisa Hamilton, MRN:  [id number], DOB:  June 09, 1946, LOS: 72 ADMISSION DATE:  04/19/2021, CONSULTATION DATE:  9/29 REFERRING MD:  Lorin Mercy, CHIEF COMPLAINT:  Cardiac arrest   History of Present Illness:  75 y/o female with COPD admitted for COPD exacerbation, she had an aspiration event leading to a cardiac arrest.  She also has a pericardial effusion which may be due to her COVID booster.  She has been dependent on mechanical ventilation and CRRT during her ICU stay.  Pertinent  Medical History  Anemia, Diverticulosis, DJD, GERD, Glaucoma, Hepatic cyst, Hiatal hernia, Colon polyps, HLD, HTN, Medullary sponge kidney, Goiter, Osteoporosis, SBO, Nephrolithiasis, RLS, Shingles, Vit D deficiency, prediabetes, COPD, chronic respiratory failure with hypoxemia on 2-3 L [place]  Significant Hospital Events: Including procedures, antibiotic start and stop dates in addition to other pertinent events   9/28 Admit 9/29 respiratory leading to cardiac arrest, intubated and transferred to ICU. Cefepime and vanc started 9/30 tonic-clonic seizure and started on keppra, developed transient A fib and started on amiodarone, started on peripheral phenylephrine, Lt IJ CVL placed.  10/1 back in sinus rhythm, off pressors, remains on increased PEEP/FiO2. 10/1 Echo > LVEF 60-65%, RV normal, large pericardial effusion, possibly RA collapse 10/3 - HD cath placed, started CRRT. 10/3 echo > LVEF 55-60%, large pericardial effusion. ETT exchange for plugging 10/04 - 10/4 CT chest > small to mod lef pneumothorax, severe bilateral air space disease, bilateral pleural efusions R > L, large pericardial effusion, cholelithiasis Chest tube placed on left. 10/4 echo > LVEF 60-65%, RA dilated, large effusion, RA collapse, no RV collapse, amaller compared to 10/3 10/05 - HD line clotted, replaced HD line in Rt IJ 10/06 - recurrent afib with rvr and titrating down. Right IJ CVL placed.  10/07 titrated off pressors, family meeting> DNR,  continue aggressive care for a few days. All cultures negative. Cefepime stopped.  Echo > LVEF 60-65%, RV size/function normal, no change in the size of the effusion, no RA collapse 10/9 acutely hypoxic w/ no breath sounds on left. Emergent bronch w/ lavage w/ thick mucous plugging involving the LLL. Sats improved after clearing.  10/11 - goals of care with family and patient about tracheostomy. Palliative consulted.   Interim History / Subjective:  Overnight events: Had goals of care conversation with family yesterday afternoon. Palliative care was consulted.   Patient remained stable on ventilator. She is is alert and oriented. She appears diaphoretic and critically ill. Continues to have thick secretions on ETT suction.   Objective   Blood pressure (!) 116/57, pulse 76, temperature 97.7 F (36.5 C), resp. rate (!) 25, height 5' (1.524 m), weight 101.5 kg, SpO2 98 %. CVP:  [7 mmHg-9 mmHg] 9 mmHg  Vent Mode: PRVC FiO2 (%):  [50 %-60 %] 50 % Set Rate:  [25 bmp] 25 bmp Vt Set:  [370 mL] 370 mL PEEP:  [10 cmH20] 10 cmH20 Plateau Pressure:  [23 cmH20-28 cmH20] 26 cmH20   Intake/Output Summary (Last 24 hours) at 05/16/2021 0651 Last data filed at 05/16/2021 0600 Gross per 24 hour  Intake 1925.98 ml  Output 2311 ml  Net -385.02 ml   Filed Weights   05/14/21 0423 05/15/21 0500 05/16/21 0500  Weight: 104.3 kg 104 kg 101.5 kg    Examination: General: Diaphoretic, chronically ill-appearing 75 year old female who remains on fairly high ventilatory support and oxygen support HEENT - ETT in place, PERRLA Pulmonary: stable scattered rhonchi bilaterally. Chest tube on the left with minimal output. Remains  intubated with  PEEP of  10 and FiO2 of 50% Cardiac: back in sinus rhythm, normal rate, no obvious murmurs.  Abdomen: Soft, currently on tube feeds and tolerating current rate bowel sounds present Extremities: Warm dry with brisk capillary refill, no significant LE edema.  Patient  Lines/Drains/Airways Status     Active Line/Drains/Airways     Name Placement date Placement time Site Days   CVC Triple Lumen 05/04/21 Left Internal jugular 05/04/21  1800  -- 11   Hemodialysis Catheter Right Internal jugular Triple lumen Temporary (Non-Tunneled) 05/09/21  1715  Internal jugular  6   Chest Tube 1 Left;Medial Mediastinal 05/08/21  1730  Mediastinal  7   Airway 8.5 mm 05/26/2021  1730  -- 8   Small Bore Feeding Tube 10 Fr. Right nare Marking at nare/corner of mouth 82 cm 05/09/21  1434  Right nare  6   Pressure Injury 05/11/21 Face Left;Medial 05/11/21  0600  -- 4   Wound / Incision (Open or Dehisced) 05/13/21 Skin tear Abdomen Right;Lower Under abdominal fold 05/13/21  2000  Abdomen  2            Resolved Hospital Problem list   Cardiac arrest   Assessment & Plan:  Acute on chronic respiratory failure with hypercarbia and hypoxemia 2/2 aspiration PNA c/b  Acute pulmonary edema and ARDS, AECOPD (baseline advanced COPD) and spont Left PTX Patient's respiratory status remains stable from yesterday.  She continues to have significant O2 requirements.  Continues to have significant thick secretions with suctioning.  Continue supplemental oxygen.  Titrate PEEP and FiO2 as possible Cont full vent support VAP bundle PAD protocol  Daily WUA and assessment for SBT Cont Brovana/pulmicort and Duonebs Cont left chest tube to sxn Pred day 4/5 for AECOPD  Large pericardial effusion believed to be due to COVID vaccine Continue to monitor hemodynamics  Intermittent atrial fibrillation Hypotension Back on amiodarone IV and in sinus rhythm and rates in the 70's. Has had more hypotension requiring more NE overnight.  - Continue Amio - Cont NE to keep MAPS >65 - Cont tele - Holding all antihypertensives  Oliguric renal failure 2/2 to ATN -Cont CRRT overnight with net even pull -Monitor urine output and kidney function - Cont strict I&O  Constipation Ileus ? BMs recorded  last 10/8 and 10/9 Patient continues to have large mostly liquid bowel movements.  Her abdomen is still distended.  -Consider CT abdomen pelvis in the future -Continue lactulose 2 times daily  Acute metabolic encephalopathy from hypoxemia/hypercarbia Isolated convulsions after cardiac arrest on 09/30 Clonazepam VT PAD protocol RASS goal   Leukocytosis Likely secondary to ongoing critical illness. -Continue to monitor closely.  Hypoglycemia:  - Likely need to D/c SSI - q4 hour CBG - Goal 140-180 range  Osteoporosis Plan Holding Fosamax  Best Practice (right click and "Reselect all SmartList Selections" daily)   Diet/type: tubefeeds DVT prophylaxis: prophylactic heparin  GI prophylaxis: PPI Lines: Central line and yes and it is still needed Foley: Yes, and it is still needed Code Status:  DNR Last date of multidisciplinary goals of care discussion [10/7, plan again on 10/9]   Critical care time: [place], D.O.  Internal Medicine Resident, PGY-3 [facility]  Pager: [phone] 6:51 AM, 05/16/2021

## 2021-05-16 NOTE — Progress Notes (Signed)
Paged to bed side for family meeting. I spoke with the patient's son, Karn Pickler, and husband, Fritz Pickerel. They stated that they have been talking to her about her goals of care, and she has decided that she wants to move forward with comfort care. I spoke with Karn Pickler about coordinating further family visitation over the next 12-24 hours. We discussed d/c nonessential medications and treatments including CRRT. Will plan for discontinuation pressors/drips and compassionate extubation tomorrow once family has had a chance to visit.   Lawerance Cruel, D.O.  Internal Medicine Resident, PGY-3 [facility]  Pager: [phone] 4:12 PM, 05/16/2021

## 2021-05-16 NOTE — Consult Note (Signed)
Consultation Note Date: 05/16/2021   Patient Name: Lisa Hamilton  DOB: 1945-08-15  MRN: [id number]  Age / Sex: 75 y.o., female  PCP: Hoyt Koch, MD Referring Physician: Juanito Doom, MD  Reason for Consultation: Establishing goals of care  HPI/Patient Profile: 75 y.o. female  with past medical history of Anemia, Diverticulosis, DJD, GERD, Glaucoma, Hepatic cyst, Hiatal hernia, Colon polyps, HLD, HTN, Medullary sponge kidney, Goiter, Osteoporosis, SBO, Nephrolithiasis, RLS, Shingles, Vit D deficiency, prediabetes, COPD, and chronic respiratory failure with hypoxemia on 2-3 L [place] admitted on 04/11/2021 with COPD exacerbation. Had an aspiration event 9/29 that led to cardiac arrest. Has been dependent on mechanical ventilator and CRRT. Also with large pericardial effusion. PMT consulted to discuss [place].    Clinical Assessment and Goals of Care: I have reviewed medical records including EPIC notes, labs and imaging, received report from Dr. Marianna Payment and Lilia Pro, RN, assessed the patient and then met with patient's son Ronalee Belts and spouse Fritz Pickerel  to discuss diagnosis prognosis, [place], EOL wishes, disposition and options.  I introduced Palliative Medicine as specialized medical care for people living with serious illness. It focuses on providing relief from the symptoms and stress of a serious illness. The goal is to improve quality of life for both the patient and the family.  Ronalee Belts shares that prior to admission patient was doing okay - she was functional - able to cook and care for herself. Had been working on making grape jelly. She did have some functional impairment and walked with walker. She wore oxygen constantly. Appetite was okay. Mind intact.   We discussed patient's current illness and what it means in the larger context of patient's on-going co-morbidities.  Natural disease trajectory and expectations at EOL were discussed. Family has a good  understanding of situation - understand her respiratory failure and renal failure. Understand her high risk of ongoing complications. They understand the severity of her illness.   I attempted to elicit values and goals of care important to the patient. Family shares they know patient would not want to be kept alive if she were unable to interact.     The difference between aggressive medical intervention and comfort care was considered in light of the patient's goals of care. We discuss that continuing aggressive medical interventions would likely include trach, peg, and HD. We discuss that she would require placement - discussed very little facilities that can care for patients that require her level of care outside of the hospital.   We discuss their decision to change code status to DNR - we review that it is unlikely full code interventions would benefit her.   We discuss concern that continuing aggressive medical interventions will not lead to return to her baseline, discussed concerns about quality of life.   Family shares they understand the situation and understand severity of her illness. They share that they would likely not choose to continue with aggressive interventions as they worry about her suffering. However, earlier in hospitalization patient shared with medical team that she would like to pursue aggressive interventions including a trach. Therefore, family feels as though they cannot make a decision that is not in line with her statement. We discuss that patient is receiving medication that could impair her cognitive ability - we discuss that while patient may be oriented she may not be able to fully comprehend implications of her decisions. We discuss need to discuss with patient further to ensure she understands the situation. Family feels that patient's  mental status is worse today - more sedate. We discuss plan for me to attempt to discuss goals of care with patient in AM - discuss  planning with nurse to time conversation during wake up assessment.   Discussed with family the importance of continued conversation with family and the medical providers regarding overall plan of care and treatment options, ensuring decisions are within the context of the patient's values and GOCs.    Discuss with family ongoing complications - discuss current concern about distended abdomen and increased WBC. They express understanding.  We concluded conversation by summarizing family's goals: they will agree to whatever patient desires and they are hopeful that patient will be more awake in AM and able to participate in goals of care conversation with me to ensure she fully understands situation.   Questions and concerns were addressed. The family was encouraged to call with questions or concerns.   Primary Decision Maker NEXT OF KIN/patient though patient's decision making ability waxes/wanes - ?spouse cognitively impaired   SUMMARY OF RECOMMENDATIONS   - family would likely lean towards a comfort approach however patient's previous statements to want to pursue aggressive care have significantly impacted their decision - they do not feel they can make a decision not in line with patient's stated wishes - plan to speak with patient in AM during wake up assessment to discuss full situation and options and follow up with family - have coordinated with RN  Code Status/Advance Care Planning: DNR     Primary Diagnoses: Present on Admission: . Acute on chronic respiratory failure with hypoxia ([place]) . HYPERCHOLESTEROLEMIA . Morbid obesity ([place]) . Essential hypertension . Asthma in adult, mild persistent, uncomplicated . Cardiac arrest [facility])   I have reviewed the medical record, interviewed the patient and family, and examined the patient. The following aspects are pertinent.  Past Medical History:  Diagnosis Date  . Anemia   . Asthmatic bronchitis   . Blood transfusion without  reported diagnosis 12/2017  . Cataract   . Constipation    chronic  . COPD (chronic obstructive pulmonary disease) ([place])   . Diverticulosis of colon   . DJD (degenerative joint disease)   . GERD (gastroesophageal reflux disease)    rare  . Glaucoma 1977   EYE SURGERY  FOR TX OF GLAUCOMA--NO LONGER HAS GLAUCOMA  . Hepatic cyst    s/p surgical intervention 05/2011 -[place]  . Hiatal hernia   . Hx of colonic polyps   . Hyperlipidemia   . Hypertension   . Medullary sponge kidney   . Nontoxic multinodular goiter    pt unsure of this/ per pt no one told her!  . Obesity   . Osteoporosis   . Partial small bowel obstruction ([place]) 10/15/2017  . Renal calculus    s/p lithotripsy 06/2011  . Restless leg syndrome   . Shingles 2009  . Squamous cell carcinoma of wrist, right    scalp, right wrist  . Venous insufficiency   . Vitamin D deficiency    Social History   Socioeconomic History  . Marital status: Married    Spouse name: Fritz Pickerel  . Number of children: 2  . Years of education: Not on file  . Highest education level: Not on file  Occupational History  . 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 Smoking status: Never  . Smokeless tobacco: Never  Vaping Use  . Vaping Use: Never used  Substance and Sexual Activity  .  Alcohol use: Yes    Alcohol/week: 0.0 standard drinks    Comment: social use  . Drug use: No  . Sexual activity: Never  Other Topics Concern  . Not on file  Social History Narrative  . Not on file   Social Determinants of Health   Financial Resource Strain: Low Risk   . Difficulty of Paying Living Expenses: Not very hard  Food Insecurity: No Food Insecurity  . Worried About Charity fundraiser in the Last Year: Never true  . Ran Out of Food in the Last Year: Never true  Transportation Needs: No Transportation Needs  . Lack of Transportation (Medical): No  . Lack of Transportation (Non-Medical): No  Physical  Activity: Inactive  . Days of Exercise per Week: 0 days  . Minutes of Exercise per Session: 0 min  Stress: No Stress Concern Present  . Feeling of Stress : Not at all  Social Connections: Socially Integrated  . Frequency of Communication with Friends and Family: More than three times a week  . Frequency of Social Gatherings with Friends and Family: More than three times a week  . Attends Religious Services: More than 4 times per year  . Active Member of Clubs or Organizations: Yes  . Attends Archivist Meetings: More than 4 times per year  . Marital Status: Married   Family History  Problem Relation Age of Onset  . Lung cancer Father   . Colon cancer Father        possible colon cancer, but unsure  . Lung cancer Sister   . Irritable bowel syndrome Sister   . Obstructive Sleep Apnea Sister   . Osteoarthritis Sister   . Esophageal cancer Neg Hx   . Rectal cancer Neg Hx   . Stomach cancer Neg Hx    Scheduled Meds: . arformoterol  15 mcg Nebulization BID  . B-complex with vitamin C  1 tablet Per Tube Daily  . budesonide (PULMICORT) nebulizer solution  0.5 mg Nebulization BID  . chlorhexidine gluconate (MEDLINE KIT)  15 mL Mouth Rinse BID  . Chlorhexidine Gluconate Cloth  6 each Topical Daily  . gabapentin  100 mg Per Tube Q8H  . guaiFENesin  10 mL Per Tube Q6H  . heparin injection (subcutaneous)  5,000 Units Subcutaneous Q8H  . lactulose  20 g Per Tube BID  . LORazepam  1 mg Intravenous QHS  . mouth rinse  15 mL Mouth Rinse 10 times per day  . [START ON 06-12-21] methylPREDNISolone (SOLU-MEDROL) injection  40 mg Intravenous Q24H  . montelukast  10 mg Per Tube QPM  . [START ON 2021/06/12] pantoprazole (PROTONIX) IV  40 mg Intravenous Q24H  . polyethylene glycol  17 g Per Tube BID  . pravastatin  40 mg Per Tube Daily  . revefenacin  175 mcg Nebulization Daily  . senna-docusate  2 tablet Per Tube BID  . sodium chloride flush  10-40 mL Intracatheter Q12H  . sodium  chloride flush  3 mL Intravenous Q12H  . sodium chloride HYPERTONIC  4 mL Nebulization BID   Continuous Infusions: .  prismasol BGK 4/2.5 300 mL/hr at 05/16/21 1056  .  prismasol BGK 4/2.5 300 mL/hr at 05/16/21 1107  . sodium chloride 10 mL/hr at 05/16/21 1400  . amiodarone 30 mg/hr (05/16/21 1400)  . ceFEPime (MAXIPIME) IV Stopped (05/16/21 1133)  . fentaNYL infusion INTRAVENOUS 100 mcg/hr (05/16/21 1400)  . heparin 10,000 units/ 20 mL infusion syringe 1,000 Units/hr (05/16/21 0821)  .  metronidazole Stopped (05/16/21 1330)  . norepinephrine (LEVOPHED) Adult infusion 18 mcg/min (05/16/21 1400)  . prismasol BGK 4/2.5 1,500 mL/hr at 05/16/21 1402   PRN Meds:.acetaminophen **OR** acetaminophen, albuterol, atropine, bisacodyl, fentaNYL (SUBLIMAZE) injection, heparin, heparin sodium (porcine), heparin, hydrALAZINE, midazolam, [DISCONTINUED] ondansetron **OR** ondansetron (ZOFRAN) IV, sodium chloride, sodium chloride flush Allergies  Allergen Reactions  . Hydromorphone Hcl Other (See Comments)    Disorientation and delirium.   Marland Kitchen Penicillins Rash and Other (See Comments)    PATIENT HAS HAD A PCN REACTION WITH IMMEDIATE RASH, FACIAL/TONGUE/THROAT SWELLING, SOB, OR LIGHTHEADEDNESS WITH HYPOTENSION:  #  #  YES  #  #  Has patient had a PCN reaction causing severe rash involving mucus membranes or skin necrosis: No Has patient had a PCN reaction that required hospitalization: No Has patient had a PCN reaction occurring within the last 10 years: No If all of the above answers are "NO", then may proceed with Cephalosporin use.   . Adhesive [Tape] Rash and Other (See Comments)    Blisters The clear tape   . Amoxicillin-Pot Clavulanate Rash  . Codeine Other (See Comments)    nightmares   Review of Systems  Unable to perform ROS: Intubated   Physical Exam Constitutional:      Comments: sedated  Cardiovascular:     Rate and Rhythm: Rhythm irregular.  Pulmonary:     Comments: Remains on vent  support Skin:    General: Skin is warm and dry.    Vital Signs: BP 95/64   Pulse 83   Temp (!) 96.8 F (36 C)   Resp (!) 22   Ht 5' (1.524 m)   Wt 101.5 kg   SpO2 98%   BMI 43.70 kg/m  Pain Scale: CPOT   Pain Score: Asleep   SpO2: SpO2: 98 % O2 Device:SpO2: 98 % O2 Flow Rate: .O2 Flow Rate (L/min): 6 L/min  IO: Intake/output summary:  Intake/Output Summary (Last 24 hours) at 05/16/2021 1452 Last data filed at 05/16/2021 1400 Gross per 24 hour  Intake 2031.46 ml  Output 1833 ml  Net 198.46 ml    LBM: Last BM Date: 05/15/21 Baseline Weight: Weight: 99.8 kg Most recent weight: Weight: 101.5 kg     Palliative Assessment/Data: PPS 30%     Time Total: 60 MINUTES Greater than 50%  of this time was spent counseling and coordinating care related to the above assessment and plan.  Juel Burrow, DNP, AGNP-C Palliative Medicine Team [phone] Pager: [phone]

## 2021-05-17 DIAGNOSIS — J9621 Acute and chronic respiratory failure with hypoxia: Secondary | ICD-10-CM | POA: Diagnosis not present

## 2021-05-17 DIAGNOSIS — J9601 Acute respiratory failure with hypoxia: Secondary | ICD-10-CM

## 2021-05-17 DIAGNOSIS — J9622 Acute and chronic respiratory failure with hypercapnia: Secondary | ICD-10-CM | POA: Diagnosis not present

## 2021-05-17 MED ORDER — GLYCOPYRROLATE 1 MG PO TABS: 1.0000 mg | ORAL_TABLET | ORAL | Status: DC | PRN

## 2021-05-17 MED ORDER — LORAZEPAM 1 MG PO TABS: 1.0000 mg | ORAL_TABLET | ORAL | Status: DC | PRN

## 2021-05-17 MED ORDER — LORAZEPAM 2 MG/ML PO CONC: 1.0000 mg | ORAL | Status: DC | PRN

## 2021-05-17 MED ORDER — GLYCOPYRROLATE 0.2 MG/ML IJ SOLN
0.2000 mg | INTRAMUSCULAR | Status: DC | PRN
  Filled 2021-05-17: qty 1

## 2021-05-17 MED ORDER — POLYVINYL ALCOHOL 1.4 % OP SOLN
1.0000 [drp] | Freq: Four times a day (QID) | OPHTHALMIC | Status: DC | PRN
  Filled 2021-05-17: qty 15

## 2021-05-17 MED ORDER — HALOPERIDOL 0.5 MG PO TABS: 0.5000 mg | ORAL_TABLET | ORAL | Status: DC | PRN

## 2021-05-17 MED ORDER — HYDROMORPHONE HCL 1 MG/ML IJ SOLN: 1.0000 mg | INTRAMUSCULAR | Status: DC

## 2021-05-17 MED ORDER — HALOPERIDOL LACTATE 2 MG/ML PO CONC
0.5000 mg | ORAL | Status: DC | PRN
  Filled 2021-05-17: qty 0.3

## 2021-05-17 MED ORDER — HALOPERIDOL LACTATE 5 MG/ML IJ SOLN: 0.5000 mg | INTRAMUSCULAR | Status: DC | PRN

## 2021-05-17 MED ORDER — BIOTENE DRY MOUTH MT LIQD: 15.0000 mL | OROMUCOSAL | Status: DC | PRN

## 2021-05-17 MED ORDER — LORAZEPAM 2 MG/ML IJ SOLN: 1.0000 mg | INTRAMUSCULAR | Status: DC | PRN

## 2021-05-17 MED ORDER — FENTANYL CITRATE (PF) 100 MCG/2ML IJ SOLN
25.0000 ug | INTRAMUSCULAR | Status: DC | PRN
  Administered 2021-05-17 (×2): 100 ug via INTRAVENOUS

## 2021-05-17 MED ORDER — GLYCOPYRROLATE 0.2 MG/ML IJ SOLN: 0.2000 mg | INTRAMUSCULAR | Status: DC | PRN

## 2021-05-17 MED ORDER — LORAZEPAM 2 MG/ML IJ SOLN
2.0000 mg | INTRAMUSCULAR | Status: AC
  Administered 2021-05-17: 2 mg via INTRAVENOUS
  Filled 2021-05-17: qty 1

## 2021-06-03 NOTE — Assessment & Plan Note (Signed)
Difficult for her to lose weight, but this would help her breathing if she can lose even a few pounds as discussed.

## 2021-06-03 NOTE — Assessment & Plan Note (Signed)
Some response to her bronchodilators.  Plan- Refill Breo

## 2021-06-03 NOTE — Assessment & Plan Note (Signed)
Remains O2 dependent. Equipment working ok.  Plan continue O2 use at current settings.

## 2021-06-05 NOTE — Progress Notes (Signed)
Patient ID: Lisa Hamilton, female   DOB: 01/29/1946, 75 y.o.   MRN: [id number]  Yesterday afternoon, patient indicated that she wanted to transition to comfort measures only with assistance from family.  CRRT discontinued and nonessential tests/procedures discontinued as well.  Anticipated compassionate/terminal extubation once family have visited with her.  Renal service will remain available for questions or concerns.  Elmarie Shiley MD [facility]. Office # [phone] Pager # [phone] 8:31 AM

## 2021-06-05 NOTE — Death Summary Note (Signed)
   NAME:  Lisa Hamilton, MRN:  [id number], DOB:  06-17-1946, LOS: 95 ADMISSION DATE:  June 01, 2021, CONSULTATION DATE:  05/03/2021 REFERRING MD:  Dr. Lorin Mercy, CHIEF COMPLAINT:  Cardiac Arrest   Physician Death Summary  Patient ID: Lisa Hamilton MRN: [id number] DOB/AGE: Jun 30, 1946 75 y.o.  Admit date: 06/01/2021 Time of Death: 2021/06/16 @ 1215 Cause of Death: Acute Hypoxic Respiratory Failure  Problem List Principal Problem:   Acute on chronic respiratory failure with hypoxia ([place]) Active Problems:   HYPERCHOLESTEROLEMIA   Morbid obesity ([place])   Essential hypertension   Asthma in adult, mild persistent, uncomplicated   Mild cognitive disorder   Cardiac arrest ([place])   AKI (acute kidney injury) ([place])   Acute respiratory failure with hypoxemia ([place])  Disposition and follow-up:   Lisa Hamilton was discharged from [facility] in expired condition.    Hospital Course: Lisa Hamilton is a 75 y/o female with a pertinent PMH of COPD, anemia, HLD, HTN, prediabetes who presented with [facility] and was admitted for acute hypoxic/hypercarbic respiratory failure in the setting of COPD exacerbation and complicated by cardiac arrest.  Patient was subsequently intubated and started. Postarrest patient developed a single episode of tonic-clonic seizure the resolved with keppra and developed intermittent A. fib with RVR and started on amiodarone.  Patient subsequently developed aspiration pneumonia complicated by ARDS resulting in significant hypotension requiring systemic pressors and broad-spectrum antibiotics.  As result, patient developed acute renal failure requiring CRRT.  Patient continued to have significant oxygen requirements throughout her hospital stay with minimal improvement.  She subsequently developed a significant leukocytosis, nausea/emesis, fever, increased pressor requirements with significantly distended abdomen concerning for intra-abdominal infection in the setting of a probable ileus.  Extensive GOC conversations were had with the family. Ultimately, the patient decided to transition to comfort care with compassionate extubation on 06-16-21 with time of death at 12:15 pm.    Signed: Lawerance Cruel, D.O.  Internal Medicine Resident, PGY-3 [facility]  Pager: [phone] 1:11 PM, 16-Jun-2021

## 2021-06-05 NOTE — Progress Notes (Signed)
NAME:  Lisa Hamilton, MRN:  [id number], DOB:  01/17/1946, LOS: 69 ADMISSION DATE:  04/08/2021, CONSULTATION DATE:  9/29 REFERRING MD:  Lorin Mercy, CHIEF COMPLAINT:  Cardiac arrest   History of Present Illness:  75 y/o female with COPD admitted for COPD exacerbation, she had an aspiration event leading to a cardiac arrest.  She also has a pericardial effusion which may be due to her COVID booster.  She has been dependent on mechanical ventilation and CRRT during her ICU stay.  Pertinent  Medical History  Anemia, Diverticulosis, DJD, GERD, Glaucoma, Hepatic cyst, Hiatal hernia, Colon polyps, HLD, HTN, Medullary sponge kidney, Goiter, Osteoporosis, SBO, Nephrolithiasis, RLS, Shingles, Vit D deficiency, prediabetes, COPD, chronic respiratory failure with hypoxemia on 2-3 L [place]  Significant Hospital Events: Including procedures, antibiotic start and stop dates in addition to other pertinent events   9/28 Admit 9/29 respiratory leading to cardiac arrest, intubated and transferred to ICU. Cefepime and vanc started 9/30 tonic-clonic seizure and started on keppra, developed transient A fib and started on amiodarone, started on peripheral phenylephrine, Lt IJ CVL placed.  10/1 back in sinus rhythm, off pressors, remains on increased PEEP/FiO2. 10/1 Echo > LVEF 60-65%, RV normal, large pericardial effusion, possibly RA collapse 10/3 - HD cath placed, started CRRT. 10/3 echo > LVEF 55-60%, large pericardial effusion. ETT exchange for plugging 10/04 - 10/4 CT chest > small to mod lef pneumothorax, severe bilateral air space disease, bilateral pleural efusions R > L, large pericardial effusion, cholelithiasis Chest tube placed on left. 10/4 echo > LVEF 60-65%, RA dilated, large effusion, RA collapse, no RV collapse, amaller compared to 10/3 10/05 - HD line clotted, replaced HD line in Rt IJ 10/06 - recurrent afib with rvr and titrating down. Right IJ CVL placed.  10/07 titrated off pressors, family meeting> DNR,  continue aggressive care for a few days. All cultures negative. Cefepime stopped.  Echo > LVEF 60-65%, RV size/function normal, no change in the size of the effusion, no RA collapse 10/9 acutely hypoxic w/ no breath sounds on left. Emergent bronch w/ lavage w/ thick mucous plugging involving the LLL. Sats improved after clearing.  10/11 - goals of care with family and patient about tracheostomy. Palliative consulted.  10/12 [place] conversation with patient, moving toward comfort care  Interim History / Subjective:  Overnight events: no significant events overnight.   Patient resting in bed with ET tube in place.   Objective   Blood pressure 103/61, pulse 95, temperature (!) 100.8 F (38.2 C), resp. rate (!) 26, height 5' (1.524 m), weight 101.2 kg, SpO2 95 %. CVP:  [9 mmHg-14 mmHg] 9 mmHg  Vent Mode: PRVC FiO2 (%):  [50 %] 50 % Set Rate:  [25 bmp] 25 bmp Vt Set:  [370 mL] 370 mL PEEP:  [10 cmH20] 10 cmH20 Plateau Pressure:  [25 cmH20-28 cmH20] 27 cmH20   Intake/Output Summary (Last 24 hours) at 2021-06-14 7672 Last data filed at 06-14-21 0000 Gross per 24 hour  Intake 1086.65 ml  Output 3695 ml  Net -2608.35 ml   Filed Weights   05/15/21 0500 05/16/21 0500 06-14-2021 0500  Weight: 104 kg 101.5 kg 101.2 kg    Examination: General: Diaphoretic, chronically ill-appearing 75 year old female who remains on fairly high ventilatory support and oxygen support HEENT - ETT in place, PERRLA Pulmonary: stable scattered rhonchi bilaterally. Chest tube on the left with minimal output. Cardiac: intermittent afib with RVR  Abdomen: distended with minimal bowel sounds.  Extremities: Warm dry with brisk  capillary refill, worsening peripheral edema.  Patient Lines/Drains/Airways Status     Active Line/Drains/Airways     Name Placement date Placement time Site Days   CVC Triple Lumen 05/04/21 Left Internal jugular 05/04/21  1800  -- 11   Hemodialysis Catheter Right Internal jugular Triple  lumen Temporary (Non-Tunneled) 05/09/21  1715  Internal jugular  6   Chest Tube 1 Left;Medial Mediastinal 05/08/21  1730  Mediastinal  7   Airway 8.5 mm 05/20/2021  1730  -- 8   Small Bore Feeding Tube 10 Fr. Right nare Marking at nare/corner of mouth 82 cm 05/09/21  1434  Right nare  6   Pressure Injury 05/11/21 Face Left;Medial 05/11/21  0600  -- 4   Wound / Incision (Open or Dehisced) 05/13/21 Skin tear Abdomen Right;Lower Under abdominal fold 05/13/21  2000  Abdomen  2            Resolved Hospital Problem list   Cardiac arrest   Assessment & Plan:  Acute on chronic respiratory failure with hypercarbia and hypoxemia 2/2 aspiration PNA c/b  Acute pulmonary edema and ARDS, AECOPD (baseline advanced COPD) and spont Left PTX Plan for compassionate extubation today.  Cont full vent support VAP bundle PAD protocol  Cont Brovana/pulmicort and Duonebs Cont left chest tube to sxn  Large pericardial effusion believed to be due to COVID vaccine Continue to monitor hemodynamics  Intermittent atrial fibrillation Hypotension Will discontinue pressors and amiodarone today.   Oliguric renal failure 2/2 to ATN - CRRT has been discontinued   Constipation Ileus Will hold off on tube feeds and lactulose today.   Acute metabolic encephalopathy from hypoxemia/hypercarbia Isolated convulsions after cardiac arrest on 09/30 Clonazepam VT PAD protocol RASS goal   Hypoglycemia:  - d/c CBG monitoring.  GOC: - compassionate extubation today - D/c vassopressors and amio  Best Practice (right click and "Reselect all SmartList Selections" daily)   Diet/type: NPO DVT prophylaxis: prophylactic heparin  GI prophylaxis: PPI Lines: Central line and yes and it is still needed Foley: Yes, and it is still needed Code Status:  DNR Last date of multidisciplinary goals of care discussion [10/7, plan again on 10/9]   Critical care time: [place], D.O.  Internal Medicine Resident,  PGY-3 [facility]  Pager: [phone] 6:25 AM, 2021/06/12

## 2021-06-05 NOTE — Progress Notes (Signed)
Pt. Cardiac time of death 1215. Heart sounds and bilateral breath sounds absent. Pronounced by Mayford Knife, RN & Kathie Rhodes, NP. Family comforted while at bedside at time of patient's passing. No belongings at bedside. MD aware of events.

## 2021-06-05 NOTE — Procedures (Signed)
Extubation Procedure Note  Patient Details:   Name: Lisa Hamilton DOB: Dec 13, 1945 MRN: [id number]   Airway Documentation:   Patient extubated per end of life, withdrawal of care. Family and RN at bedside. Vent end date: 06/16/2021 Vent end time: 1212   Evaluation  O2 sats: currently acceptable Complications: No apparent complications Patient did tolerate procedure well. Bilateral Breath Sounds: Rhonchi, Diminished   No  Ander Purpura 06/16/2021, 12:12 PM

## 2021-06-05 NOTE — Progress Notes (Signed)
Nutrition Brief Note  Chart reviewed. Pt transitioning to comfort care with plan for compassionate extubation today. CRRT has been discontinued. No further nutrition interventions planned at this time.  Please re-consult as needed.    [name], MS, RD, LDN Inpatient Clinical Dietitian Please see AMiON for contact information.

## 2021-06-05 NOTE — Progress Notes (Signed)
Daily Progress Note   Patient Name: Lisa Hamilton       Date: May 31, 2021 DOB: 11-Aug-1945  Age: 75 y.o. MRN#: [id number] Attending Physician: Juanito Doom, MD Primary Care Physician: Hoyt Koch, MD Admit Date: 04/27/2021  Reason for Consultation/Follow-up: Terminal Care and Withdrawal of life-sustaining treatment  Subjective: Patient lethargic - nods head yes when asked if comfortable  Length of Stay: 14  Current Medications: Scheduled Meds:    HYDROmorphone (DILAUDID) injection  1 mg Intravenous NOW   mouth rinse  15 mL Mouth Rinse 10 times per day    Continuous Infusions:  sodium chloride Stopped (05/16/21 1403)   fentaNYL infusion INTRAVENOUS Stopped (May 31, 2021 1223)    PRN Meds: acetaminophen **OR** acetaminophen, antiseptic oral rinse, fentaNYL (SUBLIMAZE) injection, glycopyrrolate **OR** glycopyrrolate **OR** glycopyrrolate, haloperidol **OR** haloperidol **OR** haloperidol lactate, LORazepam **OR** LORazepam **OR** LORazepam, midazolam, [DISCONTINUED] ondansetron **OR** ondansetron (ZOFRAN) IV, polyvinyl alcohol  Physical Exam Constitutional:      Comments: Lethargic/sedated  Cardiovascular:     Rate and Rhythm: Normal rate.  Pulmonary:     Comments: Remains on vent           Vital Signs: BP (!) 109/58   Pulse 78   Temp 99.5 F (37.5 C)   Resp (!) 5   Ht 5' (1.524 m)   Wt 101.2 kg   SpO2 (!) 41%   BMI 43.57 kg/m  SpO2: SpO2: (!) 41 % O2 Device: O2 Device: Nasal Cannula O2 Flow Rate: O2 Flow Rate (L/min): 6 L/min  Intake/output summary:  Intake/Output Summary (Last 24 hours) at 2021/05/31 1257 Last data filed at 05/31/2021 1230 Gross per 24 hour  Intake 1352.78 ml  Output 3322 ml  Net -1969.22 ml   LBM: Last BM Date: 05/15/21 Baseline Weight:  Weight: 99.8 kg Most recent weight: Weight: 101.2 kg       Palliative Assessment/Data: PPS 10%    Flowsheet Rows    Flowsheet Row Most Recent Value  Intake Tab   Referral Department Critical care  Unit at Time of Referral ICU  Palliative Care Primary Diagnosis Pulmonary  Date Notified 05/15/21  Palliative Care Type New Palliative care  Reason for referral Clarify Goals of Care  Date of Admission 05/04/21  Date first seen by Palliative Care 05/16/21  # of days Palliative referral response time 1 Day(s)  # of days IP prior to Palliative referral 11  Clinical Assessment   Psychosocial & Spiritual Assessment   Palliative Care Outcomes        Patient Active Problem List   Diagnosis Date Noted   Acute respiratory failure with hypoxemia ([place])    AKI (acute kidney injury) ([place])    Cardiac arrest ([place]) 05/04/2021   Acute on chronic respiratory failure with hypercapnia ([place])    Acute on chronic respiratory failure with hypoxia ([place]) 05/03/2021   Mild cognitive disorder 07/24/2020   Peripheral edema 02/12/2020   History of colonic polyps    Chronic respiratory failure with hypoxia ([place]) 03/04/2018   Nephrolithiasis 10/15/2017   Anemia 12/22/2015   Hyperglycemia 01/04/2014   NONTOXIC MULTINODULAR GOITER 08/06/2008   HYPERCHOLESTEROLEMIA 08/06/2008   RESTLESS LEG SYNDROME 08/06/2008   Essential hypertension 08/06/2008  Diverticulosis of colon without hemorrhage 08/06/2008   Liver cyst s/p partial hepatectomy 12/04/2017 08/06/2008   MEDULLARY SPONGE KIDNEY 08/06/2008   VITAMIN D DEFICIENCY 10/21/2007   Morbid obesity ([place]) 10/21/2007   GLAUCOMA 10/21/2007   Asthma in adult, mild persistent, uncomplicated 63/89/3734   Osteoarthritis 10/21/2007   Osteoporosis 10/21/2007    Palliative Care Assessment & Plan   HPI: 75 y.o. female  with past medical history of Anemia, Diverticulosis, DJD, GERD, Glaucoma, Hepatic cyst, Hiatal hernia, Colon polyps, HLD, HTN, Medullary sponge  kidney, Goiter, Osteoporosis, SBO, Nephrolithiasis, RLS, Shingles, Vit D deficiency, prediabetes, COPD, and chronic respiratory failure with hypoxemia on 2-3 L [place] admitted on 05/04/2021 with COPD exacerbation. Had an aspiration event 9/29 that led to cardiac arrest. Has been dependent on mechanical ventilator and CRRT. Also with large pericardial effusion. PMT consulted to discuss [place].    Assessment: Yesterday patient expressed to family and RN that she would like to focus on comfort and no longer continuing life prolonging measures. Time was given for family to come and visit.  Today, family at bedside to include two sons and their wives, patient's spouse, patient's sister. Patient is resting comfortably.  We discussed a plan to move forward with comfort measures only. We discussed using medications to ensure patient was comfortable prior to freeing her from ventilator. Family agreeable.  Orders placed to proceed  with comfort care. Comfort medications administered to include ativan and fentanyl bolus. Patient was subsequently extubated, stopped pressors. Remained at bedside with family to provide emotional support and educate them on process.   Patient passed away quickly following extubation - appeared comfortable. Support provided to family throughout.   Verified death with RN - verified absence of breath and heart sounds.   Recommendations/Plan: End of life care provided  Goals of Care and Additional Recommendations: Limitations on Scope of Treatment: Full Comfort Care  Code Status: DNR  Prognosis:  Hours - Days  Discharge Planning: Anticipated Hospital Death  Care plan was discussed with RN, Dr. Lake Bells, patient's family  Thank you for allowing the Palliative Medicine Team to assist in the care of this patient.   Total Time 70 minutes Prolonged Time Billed  yes       Greater than 50%  of this time was spent counseling and coordinating care related to the above assessment  and plan.  Juel Burrow, DNP, [facility] Palliative Medicine Team Team Phone # [phone]  Pager [phone]

## 2021-06-05 DEATH — deceased

## 2021-06-06 ENCOUNTER — Telehealth: Payer: Medicare Other

## 2021-07-06 ENCOUNTER — Ambulatory Visit: Payer: Medicare Other | Admitting: Internal Medicine

## 2021-08-08 ENCOUNTER — Ambulatory Visit: Payer: Medicare Other | Admitting: Internal Medicine
